# Patient Record
Sex: Female | Born: 1940 | Race: White | Hispanic: No | Smoking: Never smoker
Health system: Southern US, Community
[De-identification: ages and names within clinical notes are randomized; demographics above are authoritative.]

## PROBLEM LIST (undated history)

## (undated) ENCOUNTER — Emergency Department: Admission: EM | Discharge status: Absent without leave | Payer: Self-pay

## (undated) DIAGNOSIS — S065X9A Traumatic subdural hemorrhage with loss of consciousness of unspecified duration, initial encounter: Secondary | ICD-10-CM

## (undated) DIAGNOSIS — E785 Hyperlipidemia, unspecified: Secondary | ICD-10-CM

## (undated) DIAGNOSIS — S32009A Unspecified fracture of unspecified lumbar vertebra, initial encounter for closed fracture: Secondary | ICD-10-CM

## (undated) DIAGNOSIS — E079 Disorder of thyroid, unspecified: Secondary | ICD-10-CM

## (undated) DIAGNOSIS — R112 Nausea with vomiting, unspecified: Secondary | ICD-10-CM

## (undated) DIAGNOSIS — C44311 Basal cell carcinoma of skin of nose: Secondary | ICD-10-CM

## (undated) DIAGNOSIS — M353 Polymyalgia rheumatica: Secondary | ICD-10-CM

## (undated) DIAGNOSIS — I341 Nonrheumatic mitral (valve) prolapse: Secondary | ICD-10-CM

## (undated) DIAGNOSIS — R011 Cardiac murmur, unspecified: Secondary | ICD-10-CM

## (undated) DIAGNOSIS — I071 Rheumatic tricuspid insufficiency: Secondary | ICD-10-CM

## (undated) DIAGNOSIS — E871 Hypo-osmolality and hyponatremia: Secondary | ICD-10-CM

## (undated) DIAGNOSIS — Z9889 Other specified postprocedural states: Secondary | ICD-10-CM

## (undated) DIAGNOSIS — I951 Orthostatic hypotension: Secondary | ICD-10-CM

## (undated) DIAGNOSIS — E0590 Thyrotoxicosis, unspecified without thyrotoxic crisis or storm: Secondary | ICD-10-CM

## (undated) DIAGNOSIS — Z860100 Personal history of colon polyps, unspecified: Secondary | ICD-10-CM

## (undated) DIAGNOSIS — R55 Syncope and collapse: Secondary | ICD-10-CM

## (undated) DIAGNOSIS — I10 Essential (primary) hypertension: Secondary | ICD-10-CM

## (undated) DIAGNOSIS — I2510 Atherosclerotic heart disease of native coronary artery without angina pectoris: Secondary | ICD-10-CM

## (undated) DIAGNOSIS — K589 Irritable bowel syndrome without diarrhea: Secondary | ICD-10-CM

## (undated) DIAGNOSIS — M1990 Unspecified osteoarthritis, unspecified site: Secondary | ICD-10-CM

## (undated) DIAGNOSIS — R9431 Abnormal electrocardiogram [ECG] [EKG]: Secondary | ICD-10-CM

## (undated) DIAGNOSIS — IMO0002 Reserved for concepts with insufficient information to code with codable children: Secondary | ICD-10-CM

## (undated) DIAGNOSIS — H269 Unspecified cataract: Secondary | ICD-10-CM

## (undated) DIAGNOSIS — I4891 Unspecified atrial fibrillation: Secondary | ICD-10-CM

## (undated) DIAGNOSIS — E876 Hypokalemia: Secondary | ICD-10-CM

## (undated) DIAGNOSIS — S065XAA Traumatic subdural hemorrhage with loss of consciousness status unknown, initial encounter: Secondary | ICD-10-CM

## (undated) DIAGNOSIS — R9430 Abnormal result of cardiovascular function study, unspecified: Secondary | ICD-10-CM

## (undated) DIAGNOSIS — N870 Mild cervical dysplasia: Secondary | ICD-10-CM

## (undated) DIAGNOSIS — I5181 Takotsubo syndrome: Secondary | ICD-10-CM

## (undated) DIAGNOSIS — N3020 Other chronic cystitis without hematuria: Secondary | ICD-10-CM

## (undated) HISTORY — DX: Hyperlipidemia, unspecified: E78.5

## (undated) HISTORY — DX: Syncope and collapse: R55

## (undated) HISTORY — DX: Mild cervical dysplasia: N87.0

## (undated) HISTORY — DX: Rheumatic tricuspid insufficiency: I07.1

## (undated) HISTORY — DX: Hypo-osmolality and hyponatremia: E87.1

## (undated) HISTORY — DX: Disorder of thyroid, unspecified: E07.9

## (undated) HISTORY — DX: Personal history of colonic polyps: Z86.010

## (undated) HISTORY — DX: Unspecified fracture of unspecified lumbar vertebra, initial encounter for closed fracture: S32.009A

## (undated) HISTORY — DX: Hypokalemia: E87.6

## (undated) HISTORY — DX: Polymyalgia rheumatica: M35.3

## (undated) HISTORY — DX: Abnormal electrocardiogram (ECG) (EKG): R94.31

## (undated) HISTORY — DX: Nonrheumatic mitral (valve) prolapse: I34.1

## (undated) HISTORY — DX: Irritable bowel syndrome, unspecified: K58.9

## (undated) HISTORY — PX: TONSILLECTOMY: SUR1361

## (undated) HISTORY — DX: Thyrotoxicosis, unspecified without thyrotoxic crisis or storm: E05.90

## (undated) HISTORY — DX: Orthostatic hypotension: I95.1

## (undated) HISTORY — DX: Essential (primary) hypertension: I10

## (undated) HISTORY — DX: Reserved for concepts with insufficient information to code with codable children: IMO0002

## (undated) HISTORY — DX: Personal history of colon polyps, unspecified: Z86.0100

## (undated) HISTORY — DX: Other specified postprocedural states: Z98.890

## (undated) HISTORY — PX: COLPOSCOPY: SHX161

## (undated) HISTORY — DX: Other chronic cystitis without hematuria: N30.20

## (undated) HISTORY — DX: Traumatic subdural hemorrhage with loss of consciousness status unknown, initial encounter: S06.5XAA

## (undated) HISTORY — DX: Traumatic subdural hemorrhage with loss of consciousness of unspecified duration, initial encounter: S06.5X9A

## (undated) HISTORY — DX: Abnormal result of cardiovascular function study, unspecified: R94.30

## (undated) HISTORY — DX: Unspecified cataract: H26.9

---

## 1998-03-29 ENCOUNTER — Other Ambulatory Visit: Admission: RE | Admit: 1998-03-29 | Discharge: 1998-03-29 | Payer: Self-pay | Admitting: Obstetrics and Gynecology

## 1999-04-14 ENCOUNTER — Other Ambulatory Visit: Admission: RE | Admit: 1999-04-14 | Discharge: 1999-04-14 | Payer: Self-pay | Admitting: Obstetrics and Gynecology

## 2000-05-08 ENCOUNTER — Other Ambulatory Visit: Admission: RE | Admit: 2000-05-08 | Discharge: 2000-05-08 | Payer: Self-pay | Admitting: Obstetrics and Gynecology

## 2000-09-17 ENCOUNTER — Encounter (INDEPENDENT_AMBULATORY_CARE_PROVIDER_SITE_OTHER): Payer: Self-pay

## 2000-09-17 ENCOUNTER — Other Ambulatory Visit: Admission: RE | Admit: 2000-09-17 | Discharge: 2000-09-17 | Payer: Self-pay | Admitting: Gastroenterology

## 2001-05-12 ENCOUNTER — Other Ambulatory Visit: Admission: RE | Admit: 2001-05-12 | Discharge: 2001-05-12 | Payer: Self-pay | Admitting: Obstetrics and Gynecology

## 2001-08-06 HISTORY — PX: EXCISIONAL HEMORRHOIDECTOMY: SHX1541

## 2001-08-07 ENCOUNTER — Ambulatory Visit (HOSPITAL_BASED_OUTPATIENT_CLINIC_OR_DEPARTMENT_OTHER): Admission: RE | Admit: 2001-08-07 | Discharge: 2001-08-08 | Payer: Self-pay | Admitting: *Deleted

## 2001-08-07 ENCOUNTER — Encounter (INDEPENDENT_AMBULATORY_CARE_PROVIDER_SITE_OTHER): Payer: Self-pay | Admitting: *Deleted

## 2002-05-06 ENCOUNTER — Other Ambulatory Visit: Admission: RE | Admit: 2002-05-06 | Discharge: 2002-05-06 | Payer: Self-pay | Admitting: Obstetrics and Gynecology

## 2003-06-03 ENCOUNTER — Other Ambulatory Visit: Admission: RE | Admit: 2003-06-03 | Discharge: 2003-06-03 | Payer: Self-pay | Admitting: Obstetrics and Gynecology

## 2003-09-22 ENCOUNTER — Ambulatory Visit (HOSPITAL_COMMUNITY): Admission: RE | Admit: 2003-09-22 | Discharge: 2003-09-22 | Payer: Self-pay | Admitting: Orthopedic Surgery

## 2003-10-22 ENCOUNTER — Encounter: Admission: RE | Admit: 2003-10-22 | Discharge: 2003-10-22 | Payer: Self-pay | Admitting: Orthopedic Surgery

## 2003-11-11 ENCOUNTER — Encounter: Admission: RE | Admit: 2003-11-11 | Discharge: 2003-11-11 | Payer: Self-pay | Admitting: Orthopedic Surgery

## 2003-12-23 HISTORY — PX: COLONOSCOPY: SHX174

## 2003-12-30 ENCOUNTER — Encounter: Admission: RE | Admit: 2003-12-30 | Discharge: 2003-12-30 | Payer: Self-pay | Admitting: Orthopedic Surgery

## 2004-06-02 ENCOUNTER — Other Ambulatory Visit: Admission: RE | Admit: 2004-06-02 | Discharge: 2004-06-02 | Payer: Self-pay | Admitting: Obstetrics and Gynecology

## 2004-06-28 ENCOUNTER — Ambulatory Visit: Payer: Self-pay | Admitting: Internal Medicine

## 2004-08-08 ENCOUNTER — Encounter: Admission: RE | Admit: 2004-08-08 | Discharge: 2004-08-08 | Payer: Self-pay | Admitting: Orthopedic Surgery

## 2004-08-28 ENCOUNTER — Encounter: Admission: RE | Admit: 2004-08-28 | Discharge: 2004-08-28 | Payer: Self-pay | Admitting: Orthopedic Surgery

## 2004-10-11 ENCOUNTER — Ambulatory Visit: Payer: Self-pay | Admitting: Internal Medicine

## 2004-11-08 ENCOUNTER — Ambulatory Visit: Payer: Self-pay | Admitting: Internal Medicine

## 2004-12-14 ENCOUNTER — Ambulatory Visit: Payer: Self-pay | Admitting: *Deleted

## 2005-02-23 ENCOUNTER — Encounter: Admission: RE | Admit: 2005-02-23 | Discharge: 2005-02-23 | Payer: Self-pay | Admitting: Orthopedic Surgery

## 2005-04-06 ENCOUNTER — Ambulatory Visit: Payer: Self-pay | Admitting: *Deleted

## 2005-04-11 ENCOUNTER — Ambulatory Visit: Payer: Self-pay | Admitting: *Deleted

## 2005-04-18 ENCOUNTER — Ambulatory Visit: Payer: Self-pay

## 2005-04-25 ENCOUNTER — Ambulatory Visit (HOSPITAL_COMMUNITY): Admission: RE | Admit: 2005-04-25 | Discharge: 2005-04-25 | Payer: Self-pay | Admitting: Neurosurgery

## 2005-06-04 ENCOUNTER — Other Ambulatory Visit: Admission: RE | Admit: 2005-06-04 | Discharge: 2005-06-04 | Payer: Self-pay | Admitting: Obstetrics and Gynecology

## 2005-08-14 ENCOUNTER — Ambulatory Visit: Payer: Self-pay | Admitting: *Deleted

## 2005-11-15 ENCOUNTER — Ambulatory Visit: Payer: Self-pay | Admitting: *Deleted

## 2005-12-05 ENCOUNTER — Ambulatory Visit: Payer: Self-pay | Admitting: Internal Medicine

## 2006-03-05 ENCOUNTER — Ambulatory Visit: Payer: Self-pay | Admitting: *Deleted

## 2006-05-27 ENCOUNTER — Ambulatory Visit: Payer: Self-pay | Admitting: *Deleted

## 2006-05-27 ENCOUNTER — Ambulatory Visit: Payer: Self-pay | Admitting: Internal Medicine

## 2006-05-27 LAB — CONVERTED CEMR LAB
BUN: 13 mg/dL (ref 6–23)
CO2: 31 meq/L (ref 19–32)
Calcium: 9 mg/dL (ref 8.4–10.5)
Chloride: 106 meq/L (ref 96–112)
Chol/HDL Ratio, serum: 2.2
Cholesterol: 141 mg/dL (ref 0–200)
Creatinine, Ser: 0.7 mg/dL (ref 0.4–1.2)
GFR calc non Af Amer: 89 mL/min
Glomerular Filtration Rate, Af Am: 108 mL/min/{1.73_m2}
Glucose, Bld: 94 mg/dL (ref 70–99)
HDL: 64.5 mg/dL (ref >39.0)
Hgb A1c MFr Bld: 5.8 % (ref 4.6–6.0)
LDL Cholesterol: 63 mg/dL (ref 0–99)
Potassium: 4.1 meq/L (ref 3.5–5.1)
Sodium: 142 meq/L (ref 135–145)
Triglyceride fasting, serum: 69 mg/dL (ref 0–149)
VLDL: 14 mg/dL (ref 0–40)

## 2006-06-03 ENCOUNTER — Ambulatory Visit: Payer: Self-pay | Admitting: Gastroenterology

## 2006-06-05 ENCOUNTER — Other Ambulatory Visit: Admission: RE | Admit: 2006-06-05 | Discharge: 2006-06-05 | Payer: Self-pay | Admitting: Obstetrics and Gynecology

## 2006-06-12 ENCOUNTER — Ambulatory Visit: Payer: Self-pay | Admitting: *Deleted

## 2006-07-01 ENCOUNTER — Ambulatory Visit: Payer: Self-pay | Admitting: Internal Medicine

## 2006-07-03 ENCOUNTER — Ambulatory Visit: Payer: Self-pay | Admitting: *Deleted

## 2006-07-03 LAB — CONVERTED CEMR LAB
BUN: 13 mg/dL (ref 6–23)
CO2: 30 meq/L (ref 19–32)
Calcium: 9.3 mg/dL (ref 8.4–10.5)
Chloride: 105 meq/L (ref 96–112)
Creatinine, Ser: 0.8 mg/dL (ref 0.4–1.2)
GFR calc non Af Amer: 76 mL/min
Glomerular Filtration Rate, Af Am: 92 mL/min/{1.73_m2}
Glucose, Bld: 114 mg/dL — ABNORMAL HIGH (ref 70–99)
Potassium: 3.8 meq/L (ref 3.5–5.1)
Sodium: 142 meq/L (ref 135–145)

## 2006-07-24 ENCOUNTER — Ambulatory Visit: Payer: Self-pay | Admitting: *Deleted

## 2006-10-08 ENCOUNTER — Ambulatory Visit: Payer: Self-pay | Admitting: Internal Medicine

## 2006-10-08 LAB — CONVERTED CEMR LAB
ALT: 21 units/L (ref 0–40)
ANA Titer 1: 1:160 {titer} — ABNORMAL HIGH
AST: 20 units/L (ref 0–37)
Anti Nuclear Antibody(ANA): POSITIVE — AB
Basophils Absolute: 0.1 10*3/uL (ref 0.0–0.1)
Basophils Relative: 0.7 % (ref 0.0–1.0)
Eosinophils Absolute: 0.2 10*3/uL (ref 0.0–0.6)
Eosinophils Relative: 2.1 % (ref 0.0–5.0)
Free T4: 1 ng/dL (ref 0.6–1.6)
HCT: 40 % (ref 36.0–46.0)
Hemoglobin: 13.2 g/dL (ref 12.0–15.0)
Lymphocytes Relative: 19.2 % (ref 12.0–46.0)
MCHC: 33 g/dL (ref 30.0–36.0)
MCV: 88.5 fL (ref 78.0–100.0)
Monocytes Absolute: 0.6 10*3/uL (ref 0.2–0.7)
Monocytes Relative: 8.3 % (ref 3.0–11.0)
Neutro Abs: 5.2 10*3/uL (ref 1.4–7.7)
Neutrophils Relative %: 69.7 % (ref 43.0–77.0)
Platelets: 355 10*3/uL (ref 150–400)
RBC: 4.52 M/uL (ref 3.87–5.11)
RDW: 11.6 % (ref 11.5–14.6)
Rhuematoid fact SerPl-aCnc: <20 intl units/mL — ABNORMAL LOW (ref 0.0–20.0)
TSH: 2.12 microintl units/mL (ref 0.35–5.50)
Total CK: 40 units/L (ref 7–177)
Vitamin B-12: 385 pg/mL (ref 211–911)
WBC: 7.6 10*3/uL (ref 4.5–10.5)

## 2006-10-10 ENCOUNTER — Ambulatory Visit: Payer: Self-pay | Admitting: *Deleted

## 2006-10-10 LAB — CONVERTED CEMR LAB
BUN: 13 mg/dL (ref 6–23)
CO2: 30 meq/L (ref 19–32)
Calcium: 9.6 mg/dL (ref 8.4–10.5)
Chloride: 102 meq/L (ref 96–112)
Creatinine, Ser: 0.7 mg/dL (ref 0.4–1.2)
GFR calc Af Amer: 107 mL/min
GFR calc non Af Amer: 89 mL/min
Glucose, Bld: 86 mg/dL (ref 70–99)
Potassium: 4.6 meq/L (ref 3.5–5.1)
Sodium: 140 meq/L (ref 135–145)

## 2007-01-01 ENCOUNTER — Ambulatory Visit: Payer: Self-pay | Admitting: Internal Medicine

## 2007-01-22 ENCOUNTER — Ambulatory Visit: Payer: Self-pay | Admitting: Cardiology

## 2007-01-29 ENCOUNTER — Ambulatory Visit: Payer: Self-pay | Admitting: Cardiology

## 2007-01-29 LAB — CONVERTED CEMR LAB
ALT: 19 units/L (ref 0–35)
AST: 18 units/L (ref 0–37)
Albumin: 3.3 g/dL — ABNORMAL LOW (ref 3.5–5.2)
Alkaline Phosphatase: 51 units/L (ref 39–117)
BUN: 10 mg/dL (ref 6–23)
Bilirubin, Direct: 0.1 mg/dL (ref 0.0–0.3)
CO2: 30 meq/L (ref 19–32)
Calcium: 8.9 mg/dL (ref 8.4–10.5)
Chloride: 107 meq/L (ref 96–112)
Cholesterol: 165 mg/dL (ref 0–200)
Creatinine, Ser: 0.8 mg/dL (ref 0.4–1.2)
GFR calc Af Amer: 92 mL/min
GFR calc non Af Amer: 76 mL/min
Glucose, Bld: 82 mg/dL (ref 70–99)
HDL: 91 mg/dL (ref >39.0)
Hgb A1c MFr Bld: 6 % (ref 4.6–6.0)
LDL Cholesterol: 58 mg/dL (ref 0–99)
Potassium: 3.7 meq/L (ref 3.5–5.1)
Sodium: 147 meq/L — ABNORMAL HIGH (ref 135–145)
Total Bilirubin: 0.7 mg/dL (ref 0.3–1.2)
Total CHOL/HDL Ratio: 1.8
Total Protein: 6 g/dL (ref 6.0–8.3)
Triglycerides: 82 mg/dL (ref 0–149)
VLDL: 16 mg/dL (ref 0–40)

## 2007-03-11 DIAGNOSIS — M810 Age-related osteoporosis without current pathological fracture: Secondary | ICD-10-CM | POA: Insufficient documentation

## 2007-03-19 ENCOUNTER — Telehealth (INDEPENDENT_AMBULATORY_CARE_PROVIDER_SITE_OTHER): Payer: Self-pay | Admitting: *Deleted

## 2007-03-21 ENCOUNTER — Ambulatory Visit: Payer: Self-pay | Admitting: Family Medicine

## 2007-03-21 ENCOUNTER — Telehealth: Payer: Self-pay | Admitting: Internal Medicine

## 2007-03-24 ENCOUNTER — Telehealth: Payer: Self-pay | Admitting: Internal Medicine

## 2007-03-25 ENCOUNTER — Telehealth: Payer: Self-pay | Admitting: Internal Medicine

## 2007-03-27 ENCOUNTER — Ambulatory Visit: Payer: Self-pay | Admitting: Vascular Surgery

## 2007-03-27 ENCOUNTER — Ambulatory Visit: Payer: Self-pay | Admitting: Internal Medicine

## 2007-03-27 ENCOUNTER — Ambulatory Visit: Payer: Self-pay | Admitting: Cardiology

## 2007-03-27 ENCOUNTER — Inpatient Hospital Stay (HOSPITAL_COMMUNITY): Admission: EM | Admit: 2007-03-27 | Discharge: 2007-03-28 | Payer: Self-pay | Admitting: Emergency Medicine

## 2007-03-27 ENCOUNTER — Encounter (INDEPENDENT_AMBULATORY_CARE_PROVIDER_SITE_OTHER): Payer: Self-pay | Admitting: Internal Medicine

## 2007-03-28 ENCOUNTER — Encounter: Payer: Self-pay | Admitting: Internal Medicine

## 2007-03-28 ENCOUNTER — Encounter (INDEPENDENT_AMBULATORY_CARE_PROVIDER_SITE_OTHER): Payer: Self-pay | Admitting: Internal Medicine

## 2007-04-02 ENCOUNTER — Ambulatory Visit: Payer: Self-pay | Admitting: Internal Medicine

## 2007-04-02 DIAGNOSIS — M353 Polymyalgia rheumatica: Secondary | ICD-10-CM | POA: Insufficient documentation

## 2007-04-04 ENCOUNTER — Telehealth: Payer: Self-pay | Admitting: Internal Medicine

## 2007-04-04 ENCOUNTER — Ambulatory Visit: Payer: Self-pay | Admitting: Internal Medicine

## 2007-04-04 LAB — CONVERTED CEMR LAB
BUN: 10 mg/dL (ref 6–23)
CO2: 26 meq/L (ref 19–32)
Calcium: 9.2 mg/dL (ref 8.4–10.5)
Chloride: 98 meq/L (ref 96–112)
Creatinine, Ser: 0.8 mg/dL (ref 0.4–1.2)
GFR calc Af Amer: 92 mL/min
GFR calc non Af Amer: 76 mL/min
Glucose, Bld: 114 mg/dL — ABNORMAL HIGH (ref 70–99)
Potassium: 4.9 meq/L (ref 3.5–5.1)
Sodium: 130 meq/L — ABNORMAL LOW (ref 135–145)

## 2007-04-07 ENCOUNTER — Encounter: Payer: Self-pay | Admitting: Internal Medicine

## 2007-04-07 DIAGNOSIS — S06309A Unspecified focal traumatic brain injury with loss of consciousness of unspecified duration, initial encounter: Secondary | ICD-10-CM

## 2007-04-07 DIAGNOSIS — S020XXA Fracture of vault of skull, initial encounter for closed fracture: Secondary | ICD-10-CM | POA: Insufficient documentation

## 2007-04-18 ENCOUNTER — Telehealth: Payer: Self-pay | Admitting: Internal Medicine

## 2007-04-21 ENCOUNTER — Ambulatory Visit: Payer: Self-pay | Admitting: Internal Medicine

## 2007-04-22 ENCOUNTER — Encounter: Payer: Self-pay | Admitting: Internal Medicine

## 2007-04-24 ENCOUNTER — Encounter: Payer: Self-pay | Admitting: Internal Medicine

## 2007-04-25 ENCOUNTER — Telehealth: Payer: Self-pay | Admitting: Internal Medicine

## 2007-05-07 ENCOUNTER — Ambulatory Visit: Payer: Self-pay | Admitting: Internal Medicine

## 2007-05-08 ENCOUNTER — Encounter: Payer: Self-pay | Admitting: Internal Medicine

## 2007-06-18 ENCOUNTER — Other Ambulatory Visit: Admission: RE | Admit: 2007-06-18 | Discharge: 2007-06-18 | Payer: Self-pay | Admitting: Obstetrics and Gynecology

## 2007-07-16 ENCOUNTER — Ambulatory Visit: Payer: Self-pay | Admitting: Cardiology

## 2007-07-16 LAB — CONVERTED CEMR LAB
BUN: 13 mg/dL (ref 6–23)
CO2: 29 meq/L (ref 19–32)
Calcium: 9.7 mg/dL (ref 8.4–10.5)
Chloride: 99 meq/L (ref 96–112)
Creatinine, Ser: 0.8 mg/dL (ref 0.4–1.2)
GFR calc Af Amer: 92 mL/min
GFR calc non Af Amer: 76 mL/min
Glucose, Bld: 94 mg/dL (ref 70–99)
Potassium: 4.3 meq/L (ref 3.5–5.1)
Sodium: 136 meq/L (ref 135–145)

## 2008-01-22 ENCOUNTER — Ambulatory Visit: Payer: Self-pay | Admitting: Cardiology

## 2008-01-30 ENCOUNTER — Ambulatory Visit: Payer: Self-pay | Admitting: Cardiology

## 2008-01-30 LAB — CONVERTED CEMR LAB
ALT: 19 units/L (ref 0–35)
AST: 19 units/L (ref 0–37)
Albumin: 3.8 g/dL (ref 3.5–5.2)
Alkaline Phosphatase: 57 units/L (ref 39–117)
BUN: 11 mg/dL (ref 6–23)
Bilirubin, Direct: 0.1 mg/dL (ref 0.0–0.3)
CO2: 29 meq/L (ref 19–32)
Calcium: 9 mg/dL (ref 8.4–10.5)
Chloride: 102 meq/L (ref 96–112)
Cholesterol: 158 mg/dL (ref 0–200)
Creatinine, Ser: 0.7 mg/dL (ref 0.4–1.2)
GFR calc Af Amer: 107 mL/min
GFR calc non Af Amer: 88 mL/min
Glucose, Bld: 93 mg/dL (ref 70–99)
HDL: 77 mg/dL (ref >39.0)
LDL Cholesterol: 70 mg/dL (ref 0–99)
Potassium: 3.6 meq/L (ref 3.5–5.1)
Sodium: 137 meq/L (ref 135–145)
Total Bilirubin: 0.9 mg/dL (ref 0.3–1.2)
Total CHOL/HDL Ratio: 2.1
Total Protein: 6.4 g/dL (ref 6.0–8.3)
Triglycerides: 54 mg/dL (ref 0–149)
VLDL: 11 mg/dL (ref 0–40)

## 2008-05-12 ENCOUNTER — Encounter: Payer: Self-pay | Admitting: Internal Medicine

## 2008-05-18 ENCOUNTER — Ambulatory Visit: Payer: Self-pay | Admitting: Internal Medicine

## 2008-06-23 ENCOUNTER — Ambulatory Visit: Payer: Self-pay | Admitting: Obstetrics and Gynecology

## 2008-06-23 ENCOUNTER — Telehealth: Payer: Self-pay | Admitting: Internal Medicine

## 2008-06-23 ENCOUNTER — Other Ambulatory Visit: Admission: RE | Admit: 2008-06-23 | Discharge: 2008-06-23 | Payer: Self-pay | Admitting: Obstetrics and Gynecology

## 2008-06-23 ENCOUNTER — Encounter: Payer: Self-pay | Admitting: Obstetrics and Gynecology

## 2008-07-09 ENCOUNTER — Ambulatory Visit: Payer: Self-pay | Admitting: Cardiology

## 2008-07-12 ENCOUNTER — Ambulatory Visit: Payer: Self-pay | Admitting: Obstetrics and Gynecology

## 2008-07-14 ENCOUNTER — Telehealth: Payer: Self-pay | Admitting: *Deleted

## 2008-07-15 ENCOUNTER — Ambulatory Visit (HOSPITAL_COMMUNITY): Admission: RE | Admit: 2008-07-15 | Discharge: 2008-07-15 | Payer: Self-pay | Admitting: Obstetrics and Gynecology

## 2008-07-19 ENCOUNTER — Ambulatory Visit: Payer: Self-pay | Admitting: Internal Medicine

## 2008-07-20 ENCOUNTER — Ambulatory Visit: Payer: Self-pay | Admitting: Cardiology

## 2008-07-20 LAB — CONVERTED CEMR LAB
Cholesterol: 149 mg/dL (ref 0–200)
HDL: 68 mg/dL (ref >39.0)
LDL Cholesterol: 67 mg/dL (ref 0–99)
Total CHOL/HDL Ratio: 2.2
Triglycerides: 69 mg/dL (ref 0–149)
VLDL: 14 mg/dL (ref 0–40)

## 2008-08-11 ENCOUNTER — Ambulatory Visit: Payer: Self-pay | Admitting: Cardiology

## 2008-08-11 LAB — CONVERTED CEMR LAB
ALT: 22 units/L (ref 0–35)
AST: 23 units/L (ref 0–37)
Albumin: 4 g/dL (ref 3.5–5.2)
Alkaline Phosphatase: 67 units/L (ref 39–117)
BUN: 15 mg/dL (ref 6–23)
Bilirubin, Direct: 0.2 mg/dL (ref 0.0–0.3)
CO2: 30 meq/L (ref 19–32)
Calcium: 9.3 mg/dL (ref 8.4–10.5)
Chloride: 102 meq/L (ref 96–112)
Creatinine, Ser: 0.7 mg/dL (ref 0.4–1.2)
GFR calc Af Amer: 107 mL/min
GFR calc non Af Amer: 88 mL/min
Glucose, Bld: 98 mg/dL (ref 70–99)
Potassium: 4.8 meq/L (ref 3.5–5.1)
Sodium: 138 meq/L (ref 135–145)
Total Bilirubin: 0.9 mg/dL (ref 0.3–1.2)
Total Protein: 6.3 g/dL (ref 6.0–8.3)

## 2009-01-17 ENCOUNTER — Encounter: Payer: Self-pay | Admitting: Internal Medicine

## 2009-01-31 ENCOUNTER — Encounter: Payer: Self-pay | Admitting: Cardiology

## 2009-02-02 ENCOUNTER — Ambulatory Visit: Payer: Self-pay | Admitting: Cardiology

## 2009-02-02 LAB — CONVERTED CEMR LAB
ALT: 21 units/L (ref 0–35)
AST: 23 units/L (ref 0–37)
Albumin: 3.8 g/dL (ref 3.5–5.2)
Alkaline Phosphatase: 125 units/L — ABNORMAL HIGH (ref 39–117)
BUN: 10 mg/dL (ref 6–23)
Bilirubin, Direct: 0.1 mg/dL (ref 0.0–0.3)
CO2: 30 meq/L (ref 19–32)
Calcium: 9.2 mg/dL (ref 8.4–10.5)
Chloride: 109 meq/L (ref 96–112)
Cholesterol: 152 mg/dL (ref 0–200)
Creatinine, Ser: 0.7 mg/dL (ref 0.4–1.2)
GFR calc non Af Amer: 88.03 mL/min (ref >60)
Glucose, Bld: 94 mg/dL (ref 70–99)
HDL: 68.5 mg/dL (ref >39.00)
LDL Cholesterol: 72 mg/dL (ref 0–99)
Potassium: 4 meq/L (ref 3.5–5.1)
Sodium: 145 meq/L (ref 135–145)
Total Bilirubin: 0.7 mg/dL (ref 0.3–1.2)
Total CHOL/HDL Ratio: 2
Total Protein: 6.5 g/dL (ref 6.0–8.3)
Triglycerides: 59 mg/dL (ref 0.0–149.0)
VLDL: 11.8 mg/dL (ref 0.0–40.0)

## 2009-02-03 ENCOUNTER — Ambulatory Visit: Payer: Self-pay | Admitting: Cardiology

## 2009-02-03 ENCOUNTER — Telehealth: Payer: Self-pay | Admitting: Cardiology

## 2009-05-16 ENCOUNTER — Telehealth: Payer: Self-pay | Admitting: Cardiology

## 2009-05-18 ENCOUNTER — Ambulatory Visit: Payer: Self-pay | Admitting: Internal Medicine

## 2009-07-06 ENCOUNTER — Ambulatory Visit: Payer: Self-pay | Admitting: Obstetrics and Gynecology

## 2009-07-06 ENCOUNTER — Other Ambulatory Visit: Admission: RE | Admit: 2009-07-06 | Discharge: 2009-07-06 | Payer: Self-pay | Admitting: Obstetrics and Gynecology

## 2009-07-06 LAB — CONVERTED CEMR LAB: Pap Smear: NORMAL

## 2009-07-08 ENCOUNTER — Encounter: Payer: Self-pay | Admitting: Internal Medicine

## 2009-07-20 ENCOUNTER — Ambulatory Visit: Payer: Self-pay | Admitting: Obstetrics and Gynecology

## 2009-08-03 ENCOUNTER — Ambulatory Visit (HOSPITAL_COMMUNITY): Admission: RE | Admit: 2009-08-03 | Discharge: 2009-08-03 | Payer: Self-pay | Admitting: Obstetrics and Gynecology

## 2009-08-06 HISTORY — PX: MOHS SURGERY: SHX181

## 2009-08-09 ENCOUNTER — Ambulatory Visit: Payer: Self-pay | Admitting: Obstetrics and Gynecology

## 2009-08-12 ENCOUNTER — Ambulatory Visit: Payer: Self-pay | Admitting: Cardiology

## 2009-08-24 ENCOUNTER — Ambulatory Visit: Payer: Self-pay | Admitting: Cardiology

## 2009-08-24 ENCOUNTER — Ambulatory Visit (HOSPITAL_COMMUNITY): Admission: RE | Admit: 2009-08-24 | Discharge: 2009-08-24 | Payer: Self-pay | Admitting: Cardiovascular Disease

## 2009-08-24 ENCOUNTER — Ambulatory Visit: Payer: Self-pay

## 2009-08-24 ENCOUNTER — Encounter: Payer: Self-pay | Admitting: Cardiology

## 2009-08-26 ENCOUNTER — Ambulatory Visit: Payer: Self-pay | Admitting: Cardiology

## 2009-08-26 LAB — CONVERTED CEMR LAB
ALT: 20 units/L (ref 0–35)
AST: 22 units/L (ref 0–37)
Albumin: 4.2 g/dL (ref 3.5–5.2)
Alkaline Phosphatase: 73 units/L (ref 39–117)
BUN: 10 mg/dL (ref 6–23)
Bilirubin, Direct: 0.1 mg/dL (ref 0.0–0.3)
CO2: 29 meq/L (ref 19–32)
Calcium: 9.1 mg/dL (ref 8.4–10.5)
Chloride: 107 meq/L (ref 96–112)
Cholesterol: 160 mg/dL (ref 0–200)
Creatinine, Ser: 0.6 mg/dL (ref 0.4–1.2)
GFR calc non Af Amer: 104.99 mL/min (ref >60)
Glucose, Bld: 95 mg/dL (ref 70–99)
HDL: 75.9 mg/dL (ref >39.00)
LDL Cholesterol: 71 mg/dL (ref 0–99)
Potassium: 4.2 meq/L (ref 3.5–5.1)
Sodium: 141 meq/L (ref 135–145)
Total Bilirubin: 0.8 mg/dL (ref 0.3–1.2)
Total CHOL/HDL Ratio: 2
Total Protein: 6.9 g/dL (ref 6.0–8.3)
Triglycerides: 65 mg/dL (ref 0.0–149.0)
VLDL: 13 mg/dL (ref 0.0–40.0)

## 2009-12-29 ENCOUNTER — Telehealth: Payer: Self-pay | Admitting: Internal Medicine

## 2010-01-30 ENCOUNTER — Encounter: Payer: Self-pay | Admitting: Internal Medicine

## 2010-02-15 ENCOUNTER — Encounter: Payer: Self-pay | Admitting: Cardiology

## 2010-02-16 ENCOUNTER — Ambulatory Visit: Payer: Self-pay | Admitting: Cardiology

## 2010-02-24 ENCOUNTER — Ambulatory Visit: Payer: Self-pay | Admitting: Internal Medicine

## 2010-02-24 DIAGNOSIS — Z85828 Personal history of other malignant neoplasm of skin: Secondary | ICD-10-CM | POA: Insufficient documentation

## 2010-06-01 ENCOUNTER — Ambulatory Visit: Payer: Self-pay | Admitting: Internal Medicine

## 2010-06-20 ENCOUNTER — Telehealth: Payer: Self-pay | Admitting: Cardiology

## 2010-06-21 ENCOUNTER — Telehealth: Payer: Self-pay | Admitting: Cardiology

## 2010-07-12 ENCOUNTER — Ambulatory Visit: Payer: Self-pay | Admitting: Obstetrics and Gynecology

## 2010-07-26 ENCOUNTER — Encounter: Payer: Self-pay | Admitting: Internal Medicine

## 2010-08-06 HISTORY — PX: TOTAL KNEE ARTHROPLASTY: SHX125

## 2010-08-22 ENCOUNTER — Ambulatory Visit
Admission: RE | Admit: 2010-08-22 | Discharge: 2010-08-22 | Payer: Self-pay | Source: Home / Self Care | Attending: Cardiology | Admitting: Cardiology

## 2010-08-22 ENCOUNTER — Encounter: Payer: Self-pay | Admitting: Cardiology

## 2010-08-23 ENCOUNTER — Ambulatory Visit
Admission: RE | Admit: 2010-08-23 | Discharge: 2010-08-23 | Payer: Self-pay | Source: Home / Self Care | Attending: Internal Medicine | Admitting: Internal Medicine

## 2010-08-23 ENCOUNTER — Other Ambulatory Visit: Payer: Self-pay | Admitting: Cardiology

## 2010-08-23 ENCOUNTER — Ambulatory Visit
Admission: RE | Admit: 2010-08-23 | Discharge: 2010-08-23 | Payer: Self-pay | Source: Home / Self Care | Attending: Cardiology | Admitting: Cardiology

## 2010-08-23 ENCOUNTER — Ambulatory Visit: Admission: RE | Admit: 2010-08-23 | Payer: Self-pay | Source: Home / Self Care | Admitting: Cardiology

## 2010-08-23 ENCOUNTER — Encounter: Payer: Self-pay | Admitting: Internal Medicine

## 2010-08-23 DIAGNOSIS — M1710 Unilateral primary osteoarthritis, unspecified knee: Secondary | ICD-10-CM | POA: Insufficient documentation

## 2010-08-23 DIAGNOSIS — J310 Chronic rhinitis: Secondary | ICD-10-CM | POA: Insufficient documentation

## 2010-08-23 DIAGNOSIS — IMO0002 Reserved for concepts with insufficient information to code with codable children: Secondary | ICD-10-CM

## 2010-08-23 LAB — BASIC METABOLIC PANEL
BUN: 13 mg/dL (ref 6–23)
CO2: 28 mEq/L (ref 19–32)
Calcium: 9.4 mg/dL (ref 8.4–10.5)
Chloride: 109 mEq/L (ref 96–112)
Creatinine, Ser: 0.6 mg/dL (ref 0.4–1.2)
GFR: 104.69 mL/min (ref >60.00)
Glucose, Bld: 86 mg/dL (ref 70–99)
Potassium: 4.7 mEq/L (ref 3.5–5.1)
Sodium: 144 mEq/L (ref 135–145)

## 2010-08-23 LAB — LIPID PANEL
Cholesterol: 152 mg/dL (ref 0–200)
HDL: 69 mg/dL (ref >39.00)
LDL Cholesterol: 72 mg/dL (ref 0–99)
Total CHOL/HDL Ratio: 2
Triglycerides: 54 mg/dL (ref 0.0–149.0)
VLDL: 10.8 mg/dL (ref 0.0–40.0)

## 2010-08-23 LAB — HEPATIC FUNCTION PANEL
ALT: 20 U/L (ref 0–35)
AST: 23 U/L (ref 0–37)
Albumin: 3.9 g/dL (ref 3.5–5.2)
Alkaline Phosphatase: 69 U/L (ref 39–117)
Bilirubin, Direct: 0.1 mg/dL (ref 0.0–0.3)
Total Bilirubin: 0.3 mg/dL (ref 0.3–1.2)
Total Protein: 6.3 g/dL (ref 6.0–8.3)

## 2010-08-26 ENCOUNTER — Encounter: Payer: Self-pay | Admitting: Orthopedic Surgery

## 2010-08-27 ENCOUNTER — Encounter: Payer: Self-pay | Admitting: Orthopedic Surgery

## 2010-08-28 LAB — URINALYSIS, ROUTINE W REFLEX MICROSCOPIC
Bilirubin Urine: NEGATIVE
Hgb urine dipstick: NEGATIVE
Ketones, ur: NEGATIVE mg/dL
Nitrite: NEGATIVE
Protein, ur: NEGATIVE mg/dL
Specific Gravity, Urine: 1.006 (ref 1.005–1.030)
Urine Glucose, Fasting: NEGATIVE mg/dL
Urobilinogen, UA: 0.2 mg/dL (ref 0.0–1.0)
pH: 6 (ref 5.0–8.0)

## 2010-08-28 LAB — CBC
HCT: 43.4 % (ref 36.0–46.0)
Hemoglobin: 14 g/dL (ref 12.0–15.0)
MCH: 29.8 pg (ref 26.0–34.0)
MCHC: 32.3 g/dL (ref 30.0–36.0)
MCV: 92.3 fL (ref 78.0–100.0)
Platelets: 242 10*3/uL (ref 150–400)
RBC: 4.7 MIL/uL (ref 3.87–5.11)
RDW: 12.7 % (ref 11.5–15.5)
WBC: 6.3 10*3/uL (ref 4.0–10.5)

## 2010-08-28 LAB — PROTIME-INR
INR: 1.1 (ref 0.00–1.49)
Prothrombin Time: 14.4 seconds (ref 11.6–15.2)

## 2010-08-28 LAB — APTT: aPTT: 41 seconds — ABNORMAL HIGH (ref 24–37)

## 2010-08-28 LAB — SURGICAL PCR SCREEN
MRSA, PCR: NEGATIVE
Staphylococcus aureus: NEGATIVE

## 2010-08-30 ENCOUNTER — Inpatient Hospital Stay (HOSPITAL_COMMUNITY)
Admission: RE | Admit: 2010-08-30 | Discharge: 2010-09-04 | Payer: Self-pay | Source: Home / Self Care | Attending: Orthopedic Surgery | Admitting: Orthopedic Surgery

## 2010-08-30 LAB — TYPE AND SCREEN
ABO/RH(D): A POS
Antibody Screen: NEGATIVE

## 2010-08-30 LAB — ABO/RH: ABO/RH(D): A POS

## 2010-08-31 LAB — BASIC METABOLIC PANEL
BUN: 9 mg/dL (ref 6–23)
CO2: 26 mEq/L (ref 19–32)
Calcium: 8.3 mg/dL — ABNORMAL LOW (ref 8.4–10.5)
Chloride: 102 mEq/L (ref 96–112)
Creatinine, Ser: 0.63 mg/dL (ref 0.4–1.2)
GFR calc Af Amer: >60 mL/min (ref >60)
GFR calc non Af Amer: >60 mL/min (ref >60)
Glucose, Bld: 167 mg/dL — ABNORMAL HIGH (ref 70–99)
Potassium: 3.8 mEq/L (ref 3.5–5.1)
Sodium: 135 mEq/L (ref 135–145)

## 2010-08-31 LAB — CBC
HCT: 34.4 % — ABNORMAL LOW (ref 36.0–46.0)
Hemoglobin: 11.2 g/dL — ABNORMAL LOW (ref 12.0–15.0)
MCH: 29.3 pg (ref 26.0–34.0)
MCHC: 32.6 g/dL (ref 30.0–36.0)
MCV: 90.1 fL (ref 78.0–100.0)
Platelets: 200 10*3/uL (ref 150–400)
RBC: 3.82 MIL/uL — ABNORMAL LOW (ref 3.87–5.11)
RDW: 12.6 % (ref 11.5–15.5)
WBC: 11.2 10*3/uL — ABNORMAL HIGH (ref 4.0–10.5)

## 2010-08-31 LAB — PROTIME-INR
INR: 1.14 (ref 0.00–1.49)
Prothrombin Time: 14.8 seconds (ref 11.6–15.2)

## 2010-09-01 LAB — PROTIME-INR
INR: 1.75 — ABNORMAL HIGH (ref 0.00–1.49)
Prothrombin Time: 20.6 seconds — ABNORMAL HIGH (ref 11.6–15.2)

## 2010-09-01 LAB — BASIC METABOLIC PANEL
BUN: 10 mg/dL (ref 6–23)
CO2: 27 mEq/L (ref 19–32)
Calcium: 8.6 mg/dL (ref 8.4–10.5)
Chloride: 106 mEq/L (ref 96–112)
Creatinine, Ser: 0.64 mg/dL (ref 0.4–1.2)
GFR calc Af Amer: >60 mL/min (ref >60)
GFR calc non Af Amer: >60 mL/min (ref >60)
Glucose, Bld: 124 mg/dL — ABNORMAL HIGH (ref 70–99)
Potassium: 4.1 mEq/L (ref 3.5–5.1)
Sodium: 139 mEq/L (ref 135–145)

## 2010-09-01 LAB — CBC
HCT: 28.4 % — ABNORMAL LOW (ref 36.0–46.0)
Hemoglobin: 9.1 g/dL — ABNORMAL LOW (ref 12.0–15.0)
MCH: 28.8 pg (ref 26.0–34.0)
MCHC: 32 g/dL (ref 30.0–36.0)
MCV: 89.9 fL (ref 78.0–100.0)
Platelets: 179 10*3/uL (ref 150–400)
RBC: 3.16 MIL/uL — ABNORMAL LOW (ref 3.87–5.11)
RDW: 12.8 % (ref 11.5–15.5)
WBC: 12.2 10*3/uL — ABNORMAL HIGH (ref 4.0–10.5)

## 2010-09-02 LAB — CBC
HCT: 28.9 % — ABNORMAL LOW (ref 36.0–46.0)
Hemoglobin: 9.3 g/dL — ABNORMAL LOW (ref 12.0–15.0)
MCH: 29.4 pg (ref 26.0–34.0)
MCHC: 32.2 g/dL (ref 30.0–36.0)
MCV: 91.5 fL (ref 78.0–100.0)
Platelets: 192 10*3/uL (ref 150–400)
RBC: 3.16 MIL/uL — ABNORMAL LOW (ref 3.87–5.11)
RDW: 12.7 % (ref 11.5–15.5)
WBC: 10.9 10*3/uL — ABNORMAL HIGH (ref 4.0–10.5)

## 2010-09-02 LAB — PROTIME-INR
INR: 1.86 — ABNORMAL HIGH (ref 0.00–1.49)
Prothrombin Time: 21.6 seconds — ABNORMAL HIGH (ref 11.6–15.2)

## 2010-09-03 LAB — PROTIME-INR
INR: 1.51 — ABNORMAL HIGH (ref 0.00–1.49)
Prothrombin Time: 18.4 seconds — ABNORMAL HIGH (ref 11.6–15.2)

## 2010-09-04 LAB — PROTIME-INR
INR: 1.78 — ABNORMAL HIGH (ref 0.00–1.49)
Prothrombin Time: 20.9 seconds — ABNORMAL HIGH (ref 11.6–15.2)

## 2010-09-05 NOTE — Letter (Signed)
Summary: lipid reminder  [facility], Main Office  [address]   [place], [place] [address]   Phone: [phone]  Fax: [phone]        January 31, 2009 MRN: [id number]    [facility] [address] [place], [place]  [address]    Dear Ms. [facility],  Our records indicate it is time to check your cholesterol.  Please call our office to schedule an appt for labwork.  Please remember it is a fasting lab.     Sincerely,  Meredith Staggers, RN  This letter has been electronically signed by your physician.

## 2010-09-05 NOTE — Assessment & Plan Note (Signed)
Summary: st/njr   Vital Signs:  Patient profile:   70 year old female Menstrual status:  postmenopausal Weight:      162 pounds Temp:     98.4 degrees F oral Pulse rate:   72 / minute BP sitting:   110 / 60  (right arm) Cuff size:   regular  Vitals Entered By: Romualdo Bolk, CMA (AAMA) (February 24, 2010 2:28 PM) CC: Pt had a sore throat and fatigue for 6 days but now is fine. Pt thought she would come in to discuss what to do as far as preventive care.     Menstrual Status postmenopausal Last PAP Result normal   History of Present Illness: [name] comes in today  for acute problem see above.  Last offive visit  was in october 2008 . since that time she has been foloowed by her specialist .   Dr Myrtis Ser cards and Dr Herma Carson rheum.  Had severe sore throat   for about 6 days.  but no uri or couhg with this  and used  otc throat signs   No soreness  and then red.  Not a lot of  child exposure.       Had Mohs surgery   last week. [facility]   derm  Dr Irene Limbo. PMR  off  prednisone.  Has been on  med for osteoporosis  Bone :  reclast for 2 year  .       holding this year.  Due for   Mammo,  colonscopy ,  Hearing and vision doing well nl hearing.     Preventive Care Screening  Pap Smear:    Date:  07/06/2009    Results:  normal   Mammogram:    Date:  03/06/2009    Results:  normal   Last Tetanus Booster:    Date:  08/06/2004    Results:  Historical   Colonoscopy:    Date:  08/07/2003    Results:  normal    Preventive Screening-Counseling & Management  Alcohol-Tobacco     Alcohol drinks/day: 0     Smoking Status: never  Caffeine-Diet-Exercise     Caffeine use/day: 2     Does Patient Exercise: yes     Type of exercise: treadmill and bike     Times/week: 5  Current Medications (verified): 1)  Enalapril Maleate 20 Mg Tabs (Enalapril Maleate) .... Take 1 Tablet By Mouth Twice A Day 2)  Potassium Chloride Crys Cr 20 Meq Tbcr (Potassium Chloride Crys Cr) .... 3 Tablets  in The Am  2 Tablets in The Pm 3)  Doryx 75 Mg  Tbec (Doxycycline Hyclate) .... 3 Times Per Week 4)  Furosemide 20 Mg  Tabs (Furosemide) .Marland Kitchen.. 1 By Mouth Once Daily 5)  Coreg 12.5 Mg  Tabs (Carvedilol) .Marland Kitchen.. 1 1/2 Tabs   Two Times A Day 6)  Cozaar 50 Mg  Tabs (Losartan Potassium) .Marland Kitchen.. 1 By Mouth  Once Daily 7)  Norvasc 5 Mg  Tabs (Amlodipine Besylate) .Marland Kitchen.. 1 By Mouth Once Daily 8)  Lipitor 10 Mg  Tabs (Atorvastatin Calcium) .Marland Kitchen.. 1 By Mouth Once Daily 9)  Amitriptyline Hcl 25 Mg  Tabs (Amitriptyline Hcl) .Marland Kitchen.. 1-2 By Mouth Once Daily 10)  Baby Aspirin 81 Mg  Chew (Aspirin) 11)  Caltrate 600+d 600-400 Mg-Unit  Tabs (Calcium Carbonate-Vitamin D) 12)  Glucosamine-Chondroitin 750-600 Mg  Tabs (Glucosamine-Chondroitin) 13)  Biotin Forte 5 Mg  Tabs (Biotin) 14)  Evening Primrose   Oil (Evening Primrose) 15)  Mobic 15 Mg Tabs (Meloxicam) .... Take One Tablet By Mouth Once Daily. 16)  Reclast 5 Mg/178ml Soln (Zoledronic Acid) .... Yearly 17)  Vitmain A 25000iu .... Once Daily For 3 Mths Then Off For 1 Mth and Repeat  Allergies (verified): 1)  ! Codeine  Past History:  Past Medical History:  Osteoporosis Hyperlipidemia polymyalgia rheumatica  hypertension  mitral valve prolapse. ......  Echo.... August 2008.... mild mitral regurgitation /  moderate prolapse posterior leaflet... trivial MR.... echo.... January, 2011  tricuspid regurgitation   mild to moderate.... echo... January, 2011 Right ventricular  pressure in the range of 30 mmHg. 2008  /   PA pressure... 38 mmHg.... January, 2011 EF 65%... echo.... August 24, 2009   Prednisone therapy for her polymyalgia rheumatica.  High potassium requirement over time.  Syncope in August 2008 due to dehydration. Marland KitchenHeadache with findings of a left subdural.  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 Diarrhea in August   resolved. 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  Skin cancer   Past Surgical History: Hemorrhoidectomy Mohs surgery 2011  Past History:  Care Management: Cardiology: Myrtis Ser   Gastroenterology: Leone Payor Gynecology: Dianne Dun Rheumatology: Mallie Mussel(  Mamie Nick. )  Dermatology:[place]  Social History: Caffeine use/day:  2  Review of Systems  The patient denies anorexia, fever, weight loss, weight gain, vision loss, decreased hearing, hoarseness, peripheral edema, prolonged cough, hemoptysis, abdominal pain, melena, hematochezia, severe indigestion/heartburn, hematuria, transient blindness, difficulty walking, abnormal bleeding, enlarged lymph nodes, and angioedema.    Physical Exam  General:  alert, well-developed, well-nourished, and well-hydrated.   Head:  normocephalic and atraumatic.   Eyes:  vision grossly intact, pupils equal, and pupils round.   Ears:  R ear normal, L ear normal, and no external deformities.   Mouth:  pharynx pink and moist.   noedema or lesion Neck:  No deformities, masses, or tenderness noted. Lungs:  Normal respiratory effort, chest expands symmetrically. Lungs are clear to auscultation, no crackles or wheezes. Heart:  cardiac exam reveals S1 and S2.  There is a soft systolic murmur.no lifts.   Abdomen:  Bowel sounds positive,abdomen soft and non-tender without masses, organomegaly or   noted. Msk:  no joint swelling, no joint warmth, and no redness over joints.   Pulses:  pulses intact without delay   Extremities:  no clubbing cyanosis or edema  Neurologic:  alert & oriented X3, strength normal in all extremities, and gait normal.   Skin:  turgor normal, color normal, no ecchymoses, and no petechiae.   Cervical Nodes:  No lymphadenopathy noted Psych:  Normal eye contact, appropriate affect. Cognition appears normal.    Impression & Recommendations:  Problem # 1:  SORE THROAT (ICD-462) improved but  exposed to child .   and no uti signs  so do strep screenand  if neg just observe . presumed viral and resolved  Her updated medication list for this problem includes:    Doryx 75 Mg Tbec (Doxycycline hyclate) .Marland KitchenMarland KitchenMarland KitchenMarland Kitchen 3 times per week    Baby Aspirin 81 Mg Chew (Aspirin)    Mobic 15 Mg Tabs (Meloxicam) .Marland Kitchen... Take one tablet by mouth once daily.  Problem # 2:  HYPERTENSION (ICD-401.9) hard to swallow the potassium pills but not related to above  Her updated medication list for this problem includes:    Enalapril Maleate 20 Mg Tabs (Enalapril maleate) .Marland KitchenMarland KitchenMarland KitchenMarland Kitchen  Take 1 tablet by mouth twice a day    Furosemide 20 Mg Tabs (Furosemide) .Marland Kitchen... 1 by mouth once daily    Coreg 12.5 Mg Tabs (Carvedilol) .Marland Kitchen... 1 1/2 tabs   two times a day    Cozaar 50 Mg Tabs (Losartan potassium) .Marland Kitchen... 1 by mouth  once daily    Norvasc 5 Mg Tabs (Amlodipine besylate) .Marland Kitchen... 1 by mouth once daily  BP today: 110/60 Prior BP: 128/88 (02/16/2010)  Labs Reviewed: K+: 4.2 (08/26/2009) Creat: : 0.6 (08/26/2009)   Chol: 160 (08/26/2009)   HDL: 75.90 (08/26/2009)   LDL: 71 (08/26/2009)   TG: 65.0 (08/26/2009)  Problem # 3:  MITRAL VALVE PROLAPSE (ICD-424.0) with some  trivial TR   followed by dr  Myrtis Ser  .   Her updated medication list for this problem includes:    Coreg 12.5 Mg Tabs (Carvedilol) .Marland Kitchen... 1 1/2 tabs   two times a day    Baby Aspirin 81 Mg Chew (Aspirin)  Problem # 4:  POLYMYALGIA RHEUMATICA (ICD-725) now off  steroids   doing much better  Her updated medication list for this problem includes:    Baby Aspirin 81 Mg Chew (Aspirin)    Mobic 15 Mg Tabs (Meloxicam) .Marland Kitchen... Take one tablet by mouth once daily.  Diagnostics Reviewed:  Hgb: 13.2 (10/08/2006)   HCT: 40.0 (10/08/2006)   Platelets: 355 (10/08/2006) RBC: 4.52 (10/08/2006)   WBC: 7.6 (10/08/2006)  Problem # 5:  HYPERLIPIDEMIA (ICD-272.4)  Her updated medication list for this problem includes:    Lipitor 10 Mg Tabs (Atorvastatin calcium) .Marland Kitchen... 1 by mouth once daily  Labs Reviewed: SGOT: 22 (08/26/2009)   SGPT: 20  (08/26/2009)   HDL:75.90 (08/26/2009), 68.50 (02/02/2009)  LDL:71 (08/26/2009), 72 (02/02/2009)  Chol:160 (08/26/2009), 152 (02/02/2009)  Trig:65.0 (08/26/2009), 59.0 (02/02/2009)  Problem # 6:  Preventive Health Care (ICD-V70.0) seems utd of parameters  reviewed  and counseled   Complete Medication List: 1)  Enalapril Maleate 20 Mg Tabs (Enalapril maleate) .... Take 1 tablet by mouth twice a day 2)  Potassium Chloride Crys Cr 20 Meq Tbcr (Potassium chloride crys cr) .... 3 tablets in the am  2 tablets in the pm 3)  Doryx 75 Mg Tbec (Doxycycline hyclate) .... 3 times per week 4)  Furosemide 20 Mg Tabs (Furosemide) .Marland Kitchen.. 1 by mouth once daily 5)  Coreg 12.5 Mg Tabs (Carvedilol) .Marland Kitchen.. 1 1/2 tabs   two times a day 6)  Cozaar 50 Mg Tabs (Losartan potassium) .Marland Kitchen.. 1 by mouth  once daily 7)  Norvasc 5 Mg Tabs (Amlodipine besylate) .Marland Kitchen.. 1 by mouth once daily 8)  Lipitor 10 Mg Tabs (Atorvastatin calcium) .Marland Kitchen.. 1 by mouth once daily 9)  Amitriptyline Hcl 25 Mg Tabs (Amitriptyline hcl) .Marland Kitchen.. 1-2 by mouth once daily 10)  Baby Aspirin 81 Mg Chew (Aspirin) 11)  Caltrate 600+d 600-400 Mg-unit Tabs (Calcium carbonate-vitamin d) 12)  Glucosamine-chondroitin 750-600 Mg Tabs (Glucosamine-chondroitin) 13)  Biotin Forte 5 Mg Tabs (Biotin) 14)  Evening Primrose Oil (Evening primrose) 15)  Mobic 15 Mg Tabs (Meloxicam) .... Take one tablet by mouth once daily. 16)  Reclast 5 Mg/164ml Soln (Zoledronic acid) .... Yearly 17)  Vitmain A 25000iu  .... Once daily for 3 mths then off for 1 mth and repeat  Patient Instructions: 1)  No    intervention but if Sore throat comes back call us.   2)  Ask pharmacist   about different  POtassium   suppl that might be easier to swallow.  3)  Get your mammogram  when due.  4)  Keep [place] informed  in the meantime.      Immunization History:  Zostavax History:    Zostavax # 1:  zostavax (01/06/2010)

## 2010-09-05 NOTE — Assessment & Plan Note (Signed)
Summary: follow up/ssc   Vital Signs:  Patient Profile:   70 Years Old Female Weight:      169 pounds Pulse rate:   78 / minute BP sitting:   110 / 80  (left arm) Cuff size:   regular  Vitals Entered By: Romualdo Bolk, CMA (May 07, 2007 1:47 PM)                 Chief Complaint:  Follow up.  History of Present Illness: [name] i s following up for   diarrhea and is some better after a week of flagyl. ended last week. ? would more help.  some of symtopms increasing again.  No blood fever or weight loss.   No one else is sick  ? celiac  S/P skull fracture and hyponatremis.   Much better without headache or instability.  Labs at Dr. Phylliss Bob arae good  PMR     Stable and crp is down to 1  HT controlled.  Has noticed increased hair shedding recently   has hx of patchy alopecia bu this is different.       Current Allergies (reviewed today): ! CODEINE   Social History:    Reviewed history and no changes required:       Married       Never Smoked   Risk Factors:  Tobacco use:  never   Review of Systems       see hpi   Physical Exam  General:     alert, well-developed, well-nourished, and well-hydrated.  In NAD looks well Head:     normocephalic.  minimal abnormality at occiput Eyes:     vision grossly intact, pupils equal, and pupils round.   Neck:     No deformities, masses, or tenderness noted. Lungs:     Normal respiratory effort, chest expands symmetrically. Lungs are clear to auscultation, no crackles or wheezes. Heart:     Normal rate and regular rhythm. S1 and S2 normal without gallop, murmur, click, rub or other extra sounds. Abdomen:     Bowel sounds positive,abdomen soft and non-tender without masses, organomegaly or hernias noted. Neurologic:     non focal Skin:     no aacute rashes  hair looks nl no patches of alopecia Cervical Nodes:     No lymphadenopathy noted Psych:     normally interactive, good eye contact, and  not anxious appearing.   Additional Exam:     see labs Dr Phylliss Bob all normalincluding crp of 1    Impression & Recommendations:  Problem # 1:  DIARRHEA, INFECTIOUS (ICD-009.2) Unclear etiology but possibley post infectious.  flagyl helped bsome but relapsing   will try longer course of med and if continuing rec referr to GI  Dr. Drenda Freeze. Sam retired)   lot [id number], EXP 30 jun 09, [employer] left deltoid IM, 0.5 cc.   Problem # 2:  POLYMYALGIA RHEUMATICA (ICD-725) stable Her updated medication list for this problem includes:    Prednisone 5 Mg Tabs (Prednisone) .Marland Kitchen... 7.5 mg once daily    Baby Aspirin 81 Mg Chew (Aspirin)   Problem # 3:  HYPERTENSION (ICD-401.9) stable Her updated medication list for this problem includes:    Enalapril Maleate 20 Mg Tabs (Enalapril maleate) .Marland Kitchen... Take 1 tablet by mouth twice a day    Toprol Xl 100 Mg Tb24 (Metoprolol succinate) .Marland Kitchen... Take 1/2 tablet by mouth once a day    Furosemide 20 Mg Tabs (Furosemide) .Marland Kitchen... 1 by  mouth once daily    Coreg 12.5 Mg Tabs (Carvedilol) .Marland Kitchen... 1 po two times a day    Cozaar 50 Mg Tabs (Losartan potassium) .Marland Kitchen... 1 by mouth  once daily    Norvasc 5 Mg Tabs (Amlodipine besylate) .Marland Kitchen... 1 by mouth once daily   Problem # 4:  FX CLSD SKL VLT W/HEM NEC, CONCUSSION NOS (ICD-800.39) resolved  Complete Medication List: 1)  Enalapril Maleate 20 Mg Tabs (Enalapril maleate) .... Take 1 tablet by mouth twice a day 2)  Potassium Chloride Crys Cr 20 Meq Tbcr (Potassium chloride crys cr) .... Take 5 tablet by mouth 3)  Prednisone 5 Mg Tabs (Prednisone) .... 7.5 mg once daily 4)  Toprol Xl 100 Mg Tb24 (Metoprolol succinate) .... Take 1/2 tablet by mouth once a day 5)  Doryx 75 Mg Tbec (Doxycycline hyclate) .Marland Kitchen.. 1 once daily 6)  Furosemide 20 Mg Tabs (Furosemide) .Marland Kitchen.. 1 by mouth once daily 7)  Coreg 12.5 Mg Tabs (Carvedilol) .Marland Kitchen.. 1 po two times a day 8)  Cozaar 50 Mg Tabs (Losartan potassium) .Marland Kitchen.. 1 by mouth  once daily 9)   Norvasc 5 Mg Tabs (Amlodipine besylate) .Marland Kitchen.. 1 by mouth once daily 10)  Lipitor 10 Mg Tabs (Atorvastatin calcium) .Marland Kitchen.. 1 by mouth once daily 11)  Amitriptyline Hcl 25 Mg Tabs (Amitriptyline hcl) .Marland Kitchen.. 1-2 by mouth once daily 12)  Fosamax 70 Mg Tabs (Alendronate sodium) .Marland Kitchen.. 1 by mouth every week 13)  Baby Aspirin 81 Mg Chew (Aspirin) 14)  Caltrate 600+d 600-400 Mg-unit Tabs (Calcium carbonate-vitamin d) 15)  Glucosamine-chondroitin 750-600 Mg Tabs (Glucosamine-chondroitin) 16)  Biotin Forte 5 Mg Tabs (Biotin) 17)  Evening Primrose Oil (Evening primrose) 18)  Tumeric  19)  Metronidazole 500 Mg Tabs (Metronidazole) .Marland Kitchen.. 1 by mouth three times a day  Other Orders: Flu Vaccine 20yrs + (16109) Admin 1st Vaccine (60454)     Prescriptions: METRONIDAZOLE 500 MG  TABS (METRONIDAZOLE) 1 by mouth three times a day  #30 x 0   Entered and Authorized by:   Madelin Headings MD   Signed by:   Madelin Headings MD on 05/07/2007   Method used:   Electronically sent to ...       [facility]       [address] [place], [place]  [address]       Ph: [phone]       Fax: [phone]   RxID:   [id number]  ]  Influenza Vaccine    Vaccine Type: Fluvax 3+    Given by: Romualdo Bolk, CMA  Flu Vaccine Consent Questions    Do you have a history of severe allergic reactions to this vaccine? no    Any prior history of allergic reactions to egg and/or gelatin? no    Do you have a sensitivity to the preservative Thimersol? no    Do you have a past history of Guillan-Barre Syndrome? no    Do you currently have an acute febrile illness? no    Have you ever had a severe reaction to latex? no    Vaccine information given and explained to patient? yes    Are you currently pregnant? no

## 2010-09-05 NOTE — Assessment & Plan Note (Signed)
Summary: flu shot/njr/pt rescd//ccm  Nurse Visit  Flu Vaccine Consent Questions     Do you have a history of severe allergic reactions to this vaccine? no    Any prior history of allergic reactions to egg and/or gelatin? no    Do you have a sensitivity to the preservative Thimersol? no    Do you have a past history of Guillan-Barre Syndrome? no    Do you currently have an acute febrile illness? no    Have you ever had a severe reaction to latex? no    Vaccine information given and explained to patient? yes    Are you currently pregnant? no    Lot Number:[id number]   Exp Date:02/02/2010   Site Given  Left Deltoid IM  Allergies: 1)  ! Codeine  Orders Added: 1)  Flu Vaccine 36yrs + [90658] 2)  Administration Flu vaccine - MCR [G0008]

## 2010-09-05 NOTE — Progress Notes (Signed)
Summary: sick x 1+ week - need help  Phone Note Call from Patient Call back at [facility] Phone [phone]   Caller: Patient Call For: Lashuna Tamashiro Summary of Call: Still having headache, stomach upset. Vomited yesterday evening well into the night. I need Dr. Fabian Sharp to look into this Initial call taken by: Barnie Mort,  March 25, 2007 8:47 AM  Follow-up for Phone Call        Does she have a fever?. Hydrocodone can cause a headache so I would definitely y stop this if she is still taking it. Has she been on antibiotics recently?  If she is allowed to do advil she can try this. May need ov to evaluate if not better off the narcotic. Follow-up by: Madelin Headings MD,  March 25, 2007 12:47 PM  Additional Follow-up for Phone Call Additional follow up Details #1::        No fever.  Will stop the hydrocodone.  Still nauseated.  Used husband's promethazine.  Cannot toerate liquids.  No antibiotics, but is on prednisone for PMR.  Wonders if she's now dehydrated.  Advised ER to evaluate hydration this pm. Says when she fainted last week she hit her head.  Head is sore.  ER to evaluate head trauma.  Weak.  Ov tomorrow if not to ER for further evaluation.     Additional Follow-up by: Rudy Jew, RN,  March 25, 2007 4:36 PM

## 2010-09-05 NOTE — Progress Notes (Signed)
Summary: calling about generic brand of lipitor  Phone Note Call from Patient Call back at Home Phone [phone]   Caller: Patient Summary of Call: Pt calling regardiing getting Lipitor in a generic brand sent to [facility] Initial call taken by: Judie Grieve,  June 20, 2010 10:09 AM  Follow-up for Phone Call        spoke w/pt rx sent for generic lipitor Meredith Staggers, RN  June 20, 2010 12:04 PM     New/Updated Medications: LIPITOR 10 MG  TABS (ATORVASTATIN CALCIUM) 1 by mouth once daily Prescriptions: LIPITOR 10 MG  TABS (ATORVASTATIN CALCIUM) 1 by mouth once daily  #90 x 3   Entered by:   Meredith Staggers, RN   Authorized by:   Talitha Givens, MD, [facility]   Signed by:   Meredith Staggers, RN on 06/20/2010   Method used:   Faxed to ...       [facility] (mail-order)       [address]       [place], [place]  [id number]       Ph: [phone]       Fax: [phone]   RxID:   [id number]

## 2010-09-05 NOTE — Progress Notes (Signed)
Summary: refill meds  Phone Note Refill Request Call back at Home Phone [phone] Message from:  Patient on May 16, 2009 11:02 AM  Refills Requested: Medication #1:  POTASSIUM CHLORIDE CRYS CR 20 MEQ TBCR Take 5 tablet by mouth   Supply Requested: 3 months atena fax @#[phone] / [phone] - rx # / member id # [id number]. pls call pt when it's done.    Method Requested: Fax to [facility] Initial call taken by: Lorne Skeens,  May 16, 2009 11:03 AM    New/Updated Medications: POTASSIUM CHLORIDE CRYS CR 20 MEQ TBCR (POTASSIUM CHLORIDE CRYS CR) 3 tablets in the AM  2 tablets in the PM Prescriptions: POTASSIUM CHLORIDE CRYS CR 20 MEQ TBCR (POTASSIUM CHLORIDE CRYS CR) 3 tablets in the AM  2 tablets in the PM  #450 x 3   Entered by:   Laurance Flatten CMA   Authorized by:   Talitha Givens, MD, [facility]   Signed by:   Laurance Flatten CMA on 05/18/2009   Method used:   Faxed to ...       [facility] (mail-order)             , [place]         Ph: [phone]       Fax: [phone]   RxID:   [id number]

## 2010-09-05 NOTE — Progress Notes (Signed)
Summary: shingles?  Phone Note Call from Patient   Caller: Patient Call For: Madelin Headings MD Summary of Call: Would like to know if she should have a shingles vaccine and where? [phone]   Initial call taken by: Lynann Beaver CMA,  Dec 29, 2009 12:33 PM  Follow-up for Phone Call        good idea to get it but it is on manufacturing  back order.  can ask the health department or the pharmacies that give vaccines, Follow-up by: Madelin Headings MD,  Dec 29, 2009 4:02 PM  Additional Follow-up for Phone Call Additional follow up Details #1::        Left detailed message on personal voice mail. Additional Follow-up by: Lynann Beaver CMA,  Dec 29, 2009 4:47 PM

## 2010-09-05 NOTE — Assessment & Plan Note (Signed)
Summary: 6 mo f/u   Visit Type:  Follow-up Primary Provider:  Madelin Headings MD  CC:  mitral valve prolapse.  History of Present Illness: The patient is seen for followup of mitral valve the left.  I saw her last January, 2010.  Her lipid profile at that time was excellent.  Her HDL is high and her LDL is low.  Liver functions were normal.  She did not have any chest pain.  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  She's not had any recurrent syncope or presyncope.  Two-dimensional echo was done in January, 2011.  She has excellent left ventricular function.  She has mild to moderate mitral prolapse but there is only trivial mitral regurgitation.  There is mild regurgitation.   Current Medications (verified): 1)  Enalapril Maleate 20 Mg Tabs (Enalapril Maleate) .... Take 1 Tablet By Mouth Twice A Day 2)  Potassium Chloride Crys Cr 20 Meq Tbcr (Potassium Chloride Crys Cr) .... 3 Tablets in The Am  2 Tablets in The Pm 3)  Doryx 75 Mg  Tbec (Doxycycline Hyclate) .... 3 Times Per Week 4)  Furosemide 20 Mg  Tabs (Furosemide) .Marland Kitchen.. 1 By Mouth Once Daily 5)  Coreg 12.5 Mg  Tabs (Carvedilol) .Marland Kitchen.. 1 1/2 Tabs   Two Times A Day 6)  Cozaar 50 Mg  Tabs (Losartan Potassium) .Marland Kitchen.. 1 By Mouth  Once Daily 7)  Norvasc 5 Mg  Tabs (Amlodipine Besylate) .Marland Kitchen.. 1 By Mouth Once Daily 8)  Lipitor 10 Mg  Tabs (Atorvastatin Calcium) .Marland Kitchen.. 1 By Mouth Once Daily 9)  Amitriptyline Hcl 25 Mg  Tabs (Amitriptyline Hcl) .Marland Kitchen.. 1-2 By Mouth Once Daily 10)  Baby Aspirin 81 Mg  Chew (Aspirin) 11)  Caltrate 600+d 600-400 Mg-Unit  Tabs (Calcium Carbonate-Vitamin D) 12)  Glucosamine-Chondroitin 750-600 Mg  Tabs (Glucosamine-Chondroitin) 13)  Biotin Forte 5 Mg  Tabs (Biotin) 14)  Evening Primrose   Oil (Evening Primrose) 15)  Mobic 15 Mg Tabs (Meloxicam) .... Take One Tablet By Mouth Once Daily. 16)  Reclast 5 Mg/181ml Soln (Zoledronic Acid) .... Yearly 17)  Vitmain A 25000iu .... Once Daily For 3 Mths Then  Off For 1 Mth and Repeat  Allergies (verified): 1)  ! Codeine  Past History:  Past Medical History:  Osteoporosis Hyperlipidemia polymyalgia rheumatica  hypertension  mitral valve prolapse. ......  Echo.... August 2008.... mild mitral regurgitation /  moderate prolapse posterior leaflet... trivial MR.... echo.... January, 2011  tricuspid regurgitation   mild to moderate.... echo... January, 2011 Right ventricular  pressure in the range of 30 mmHg. 2008  /   PA pressure... 38 mmHg.... January, 2011 EF 65%... echo.... August 24, 2009   Prednisone therapy for her polymyalgia rheumatica.  High potassium requirement over time.  Syncope in August 2008 due to dehydration. Marland KitchenHeadache with findings of a left subdural.  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 Diarrhea in August   resolved. 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   Review of Systems       Patient denies fever, chills, headache, sweats, rash, change in vision, change in hearing, chest pain, cough, nausea vomiting, urinary symptoms.  All other systems are reviewed and are negative  Vital Signs:  Patient profile:   70 year old female Height:      65 inches Weight:  162 pounds BMI:     27.06 Pulse rate:   63 / minute Resp:     16 per minute BP sitting:   128 / 88  (left arm)  Vitals Entered By: Marrion Coy, CNA (February 16, 2010 2:45 PM)  Physical Exam  General:  patient is stable. Eyes:  no xanthelasma. Nose:  she had a basal cell carcinoma removed just lateral to her nose. Neck:  no jugular venous distention. Lungs:  lungs are clear.  Respiratory effort is nonlabored. Heart:  cardiac exam reveals S1 and S2.  There is a soft systolic murmur. Abdomen:  abdomen is soft. Extremities:  no peripheral edema. Psych:  patient is oriented to person time and place.  Affect is normal.   Impression &  Recommendations:  Problem # 1:  SYNCOPE (ICD-780.2) There's been no recurrent syncope.  No further workup.  Problem # 2:  MITRAL VALVE PROLAPSE (ICD-424.0)  Echo shows moderate mitral valve prolapse but only trivial mitral regurgitation.  No further workup.  Orders: EKG w/ Interpretation (93000)  Problem # 3:  HYPERLIPIDEMIA (ICD-272.4)  Her updated medication list for this problem includes:    Lipitor 10 Mg Tabs (Atorvastatin calcium) .Marland Kitchen... 1 by mouth once daily The patient's lipids are excellent.  No change in therapy.  Six-month followup.  Patient Instructions: 1)  Your physician recommends that you return for a FASTING lipid, liver and bmet profile: 424.0, 272.0 in 6 months. 2)  Your physician wants you to follow-up in:  6 months.  You will receive a reminder letter in the mail two months in advance. If you don't receive a letter, please call our office to schedule the follow-up appointment.

## 2010-09-05 NOTE — Miscellaneous (Signed)
  Clinical Lists Changes  Observations: Added new observation of PAST MED HX:  Osteoporosis Hyperlipidemia polymyalgia rheumatica  hypertension  mitral valve prolapse. ......  Echo.... August 2008.... mild mitral regurgitation  moderate tricuspid regurgitation.  Right ventricular     pressure in the range of 30 mmHg.  Normal left ventricular function.I  Prednisone therapy for her polymyalgia rheumatica.  High potassium requirement over time.  Syncope in August 2008 due to dehydration. Marland KitchenHeadache with findings of a left subdural.  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 Diarrhea in August   resolved. 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   (08/12/2009 12:12) Added new observation of PRIMARY MD: Madelin Headings MD (08/12/2009 12:12)       Past History:  Past Medical History:  Osteoporosis Hyperlipidemia polymyalgia rheumatica  hypertension  mitral valve prolapse. ......  Echo.... August 2008.... mild mitral regurgitation  moderate tricuspid regurgitation.  Right ventricular     pressure in the range of 30 mmHg.  Normal left ventricular function.I  Prednisone therapy for her polymyalgia rheumatica.  High potassium requirement over time.  Syncope in August 2008 due to dehydration. Marland KitchenHeadache with findings of a left subdural.  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 Diarrhea in August   resolved. 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

## 2010-09-05 NOTE — Miscellaneous (Signed)
  Clinical Lists Changes  Problems: Added new problem of MITRAL VALVE PROLAPSE (ICD-424.0) Added new problem of TRICUSPID REGURGITATION (ICD-397.0) Added new problem of * PREDNISONE THERAPY Added new problem of * HIGH POTASSIUM REQUIREMENT Added new problem of SYNCOPE (ICD-780.2) Observations: Added new observation of PAST MED HX:  Osteoporosis Hyperlipidemia polymyalgia rheumatica  hypertension  mitral valve prolapse.  recent echocardiogram... prolapse,  but no significant MR  moderate tricuspid regurgitation.  Right ventricular     pressure in the range of 30 mmHg.  Normal left ventricular function.I  Prednisone therapy for her polymyalgia rheumatica.  High potassium requirement over time.  Syncope in August 2008 due to dehydration. Marland KitchenHeadache with findings of a left subdural.  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 Diarrhea in August   resolved. 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   (02/02/2009 15:24)       Past History:  Past Medical History:  Osteoporosis Hyperlipidemia polymyalgia rheumatica  hypertension  mitral valve prolapse.  recent echocardiogram... prolapse,  but no significant MR  moderate tricuspid regurgitation.  Right ventricular     pressure in the range of 30 mmHg.  Normal left ventricular function.I  Prednisone therapy for her polymyalgia rheumatica.  High potassium requirement over time.  Syncope in August 2008 due to dehydration. Marland KitchenHeadache with findings of a left subdural.  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 Diarrhea in August   resolved. 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

## 2010-09-05 NOTE — Progress Notes (Signed)
Summary: diarrhea, vomiting  Phone Note Call from Patient Call back at Home Phone [phone]   Caller: Patient Call For: panosh Summary of Call: pt c/o diarrhea since friday , vomiting started sat and sun, needs to get under control. please call  Initial call taken by: Calvert Cantor,  March 19, 2007 3:31 PM  Follow-up for Phone Call        Pt has not been on clear liquids.  Pt was instructed to stay on clear liquids, purchase Gatorade and sip on it, to stay hydrated.  Cont. with Immodium as directed and call back on 8/15 if sx persist.  Pt was encouraged to try to take all her regular meds as prescribed. Pt voiced her understanding. Follow-up by: Sid Falcon LPN,  March 19, 2007 5:51 PM

## 2010-09-05 NOTE — Miscellaneous (Signed)
  Clinical Lists Changes  Observations: Added new observation of PAST MED HX:  Osteoporosis Hyperlipidemia polymyalgia rheumatica  hypertension  mitral valve prolapse. ......  Echo.... August 2008.... mild mitral regurgitation /  moderate prolapse posterior leaflet... trivial MR.... echo.... January, 2011  tricuspid regurgitation   mild to moderate.... echo... January, 2011 Right ventricular  pressure in the range of 30 mmHg. 2008  /   PA pressure... 38 mmHg.... January, 2011 EF 65%... echo.... August 24, 2009   Prednisone therapy for her polymyalgia rheumatica.  High potassium requirement over time.  Syncope in August 2008 due to dehydration. Marland KitchenHeadache with findings of a left subdural.  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 Diarrhea in August   resolved. 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   (02/15/2010 18:09) Added new observation of PRIMARY MD: Madelin Headings MD (02/15/2010 18:09)       Past History:  Past Medical History:  Osteoporosis Hyperlipidemia polymyalgia rheumatica  hypertension  mitral valve prolapse. ......  Echo.... August 2008.... mild mitral regurgitation /  moderate prolapse posterior leaflet... trivial MR.... echo.... January, 2011  tricuspid regurgitation   mild to moderate.... echo... January, 2011 Right ventricular  pressure in the range of 30 mmHg. 2008  /   PA pressure... 38 mmHg.... January, 2011 EF 65%... echo.... August 24, 2009   Prednisone therapy for her polymyalgia rheumatica.  High potassium requirement over time.  Syncope in August 2008 due to dehydration. Marland KitchenHeadache with findings of a left subdural.  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 Diarrhea in August   resolved. 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

## 2010-09-05 NOTE — Assessment & Plan Note (Signed)
Summary: 3 MONTH ROA/JLS   Vital Signs:  Patient Profile:   70 Years Old Female Weight:      167 pounds Pulse rate:   66 / minute BP sitting:   100 / 60  (left arm) Cuff size:   regular  Vitals Entered By: Romualdo Bolk, CMA (April 02, 2007 2:14 PM)               Chief Complaint:  ROV on BS an d Post hospital F/U- pt fainted 2 weeks ago and diarrhea x 3 1/2 weeks.  History of Present Illness: [name]  is here for hospital follow-up.  She was hospitalized recently for headache nausea and vomiting after a prolonged diarrheal illness and syncope.  She was noted to have hyponatremia a somewhat chronic subdural hematoma evaluated by neurosurgery and felt that he could resolve on its own.  And some dehydration.  Discharge from the hospital her sodium level was about 129.  Her headache is better and her balance is better however she has continued diarrhea.    Diarrhea still 3x per day.  No blood or fever.  Stool checked in hospital. results pending.  Some caffiene no juices. No etoh.  Is supposed to have ct repeated within a week.    she is planning a trip to [facility] next week and wonders if she can still go.  She is still on prednisone 7.5 mg for her PMR. This is stable.  Current Allergies (reviewed today): ! CODEINE  Past Medical History:    Hypertension    Osteoporosis    Hyperlipidemia    polymyalgia rheumatica   Social History:    Reviewed history and no changes required:   Risk Factors:  Tobacco use:  never Alcohol use:  no Exercise:  yes    Times per week:  5    Type:  treadmill and bike Seatbelt use:  100 %   Review of Systems       as per HPI   Physical Exam  General:     alert, well-developed, well-nourished, and well-hydrated.  somewhat tired normal gait Lungs:     Normal respiratory effort, chest expands symmetrically. Lungs are clear to auscultation, no crackles or wheezes. Heart:     Normal rate and regular rhythm. S1 and S2  normal without gallop, murmur, click, rub or other extra sounds. Abdomen:     Bowel sounds positive,abdomen soft and non-tender without masses, organomegaly or hernias noted. Neurologic:     nonfocal gait appears within normal limits. Skin:     turgor normal and color normal.   Cervical Nodes:     No lymphadenopathy noted Psych:     normally interactive and good eye contact.      Impression & Recommendations:  Problem # 1:  DIARRHEA, INFECTIOUS (ICD-009.2) this appears to be a bit prolonged although appears to be less acute at present.  Culture and C. difficile evaluation is pending.  We may consider a peer at Flagyl if continuing.  Problem # 2:  HYPONATREMIA (ICD-276.1) this was felt to be secondary to her subdural hematoma and fainting and dehydration.  She has been back on her Lasix for two days and we will recheck her BMP today. 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 Orders: Radiology Referral (Radiology)   Problem # 4:  POLYMYALGIA RHEUMATICA (  ICD-725) as per rheumatology continue her medications. Her updated medication list for this problem includes:    Prednisone 5 Mg Tabs (Prednisone) .Marland Kitchen... 7.5 mg once daily    Baby Aspirin 81 Mg Chew (Aspirin)   Complete Medication List: 1)  Enalapril Maleate 20 Mg Tabs (Enalapril maleate) .... Take 1 tablet by mouth twice a day 2)  Potassium Chloride Crys Cr 20 Meq Tbcr (Potassium chloride crys cr) .... Take 5 tablet by mouth 3)  Prednisone 5 Mg Tabs (Prednisone) .... 7.5 mg once daily 4)  Toprol Xl 100 Mg Tb24 (Metoprolol succinate) .... Take 1/2 tablet by mouth once a day 5)  Doryx 75 Mg Tbec (Doxycycline hyclate) .Marland Kitchen.. 1 once daily 6)  Furosemide 20 Mg Tabs (Furosemide) .Marland Kitchen.. 1 by mouth once daily 7)  Coreg 12.5 Mg Tabs  (Carvedilol) .Marland Kitchen.. 1 po two times a day 8)  Cozaar 50 Mg Tabs (Losartan potassium) .Marland Kitchen.. 1 by mouth  once daily 9)  Norvasc 5 Mg Tabs (Amlodipine besylate) .Marland Kitchen.. 1 by mouth once daily 10)  Lipitor 10 Mg Tabs (Atorvastatin calcium) .Marland Kitchen.. 1 by mouth once daily 11)  Amitriptyline Hcl 25 Mg Tabs (Amitriptyline hcl) .Marland Kitchen.. 1-2 by mouth once daily 12)  Fosamax 70 Mg Tabs (Alendronate sodium) .Marland Kitchen.. 1 by mouth every week 13)  Baby Aspirin 81 Mg Chew (Aspirin) 14)  Caltrate 600+d 600-400 Mg-unit Tabs (Calcium carbonate-vitamin d) 15)  Glucosamine-chondroitin 750-600 Mg Tabs (Glucosamine-chondroitin) 16)  Biotin Forte 5 Mg Tabs (Biotin) 17)  Evening Primrose Oil (Evening primrose) 18)  Tumeric

## 2010-09-05 NOTE — Progress Notes (Signed)
Summary: REFILL MEDS   Phone Note Refill Request Call back at Home Phone [phone] Message from:  Patient on February 03, 2009 11:33 AM  Refills Requested: Medication #1:  ENALAPRIL MALEATE 20 MG TABS Take 1 tablet by mouth twice a day [facility] ORDER [phone] FAXE/ MEMBER ID [id number]   Method Requested: Fax to Mail Away Pharmacy Initial call taken by: Lorne Skeens,  February 03, 2009 11:34 AM      Prescriptions: ENALAPRIL MALEATE 20 MG TABS (ENALAPRIL MALEATE) Take 1 tablet by mouth twice a day  #180 x 1   Entered by:   Laurance Flatten CMA   Authorized by:   Talitha Givens, MD, [facility]   Signed by:   Laurance Flatten CMA on 02/03/2009   Method used:   Faxed to ...       [facility] (mail-order)             , [place]         Ph: [phone]       Fax: [phone]   RxID:   [id number] ENALAPRIL MALEATE 20 MG TABS (ENALAPRIL MALEATE) Take 1 tablet by mouth twice a day  #180 x 1   Entered by:   Laurance Flatten CMA   Authorized by:   Talitha Givens, MD, [facility]   Signed by:   Laurance Flatten CMA on 02/03/2009   Method used:   Historical   RxID:   [id number]

## 2010-09-05 NOTE — Progress Notes (Signed)
Summary: virus x 1wk.  now ha  Phone Note Call from Patient Call back at Home Phone [phone]   Caller: Patient Call For: [name] Reason for Call: Talk to Nurse Summary of Call: Woke up this morning with a headache took 2 tylenol w/no relief. Took 1 hydrocodone 1 hour ago still hurting and has diarrhea Pharmacy : [facility] [address] Initial call taken by: Barnie Mort,  March 24, 2007 2:44 PM  Follow-up for Phone Call        call to patient.  She saw Dr. Juliet Rude.  He said she has a virus.  Now headache keeping her from resting. In bed.  Using ice pack to head.  Will try Advil/Aleve.  Caution for more stomach upset.  Clear liquids in preference to foods.  No juice or milk.  Rest.   Requests Dr. Rosezella Florida  advice tomorrow.   Follow-up by: Rudy Jew, RN,  March 24, 2007 3:00 PM  Additional Follow-up for Phone Call Additional follow up Details #1::        see other phone note

## 2010-09-05 NOTE — Progress Notes (Signed)
Summary: refill request  Phone Note Refill Request Message from:  Patient on June 21, 2010 3:29 PM  Refills Requested: Medication #1:  FUROSEMIDE 20 MG  TABS 1 by mouth once daily  Medication #2:  COREG 12.5 MG  TABS 1 1/2 tabs   two times a day and amlodipine at [facility]   Method Requested: Fax to Local Pharmacy Initial call taken by: Glynda Jaeger,  June 21, 2010 3:31 PM    Prescriptions: NORVASC 5 MG  TABS (AMLODIPINE BESYLATE) 1 by mouth once daily  #90 x 3   Entered by:   Hardin Negus, RMA   Authorized by:   Talitha Givens, MD, [facility]   Signed by:   Hardin Negus, RMA on 06/22/2010   Method used:   Faxed to ...       [facility] (mail-order)       [address]       [place], [place]  [id number]       Ph: [phone]       Fax: [phone]   RxID:   [id number] COREG 12.5 MG  TABS (CARVEDILOL) 1 1/2 tabs   two times a day  #270 Tablet x 3   Entered by:   Hardin Negus, RMA   Authorized by:   Talitha Givens, MD, [facility]   Signed by:   Hardin Negus, RMA on 06/22/2010   Method used:   Faxed to ...       [facility] (mail-order)       [address]       [place], [place]  [id number]       Ph: [phone]       Fax: [phone]   RxID:   [id number] FUROSEMIDE 20 MG  TABS (FUROSEMIDE) 1 by mouth once daily  #90 x 3   Entered by:   Hardin Negus, RMA   Authorized by:   Talitha Givens, MD, [facility]   Signed by:   Hardin Negus, RMA on 06/22/2010   Method used:   Faxed to ...       [facility] (mail-order)       [address]       [place], [place]  [id number]       Ph: [phone]       Fax: [phone]   RxID:   [id number]

## 2010-09-05 NOTE — Assessment & Plan Note (Signed)
Summary: [name]   Visit Type:  Follow-up Primary Provider:  Madelin Headings MD  CC:  mitral regurgitation.  History of Present Illness: The patient is seen for cardiology followup.  She is feeling well.  She had a remote episode of syncope.  She has had no recurrence.  She does not have chest pain or shortness of breath.  There's been no major dizziness.   Current Medications (verified): 1)  Enalapril Maleate 20 Mg Tabs (Enalapril Maleate) .... Take 1 Tablet By Mouth Twice A Day 2)  Potassium Chloride Crys Cr 20 Meq Tbcr (Potassium Chloride Crys Cr) .... 3 Tablets in The Am  2 Tablets in The Pm 3)  Doryx 75 Mg  Tbec (Doxycycline Hyclate) .... 3 Times Per Week 4)  Furosemide 20 Mg  Tabs (Furosemide) .Marland Kitchen.. 1 By Mouth Once Daily 5)  Coreg 12.5 Mg  Tabs (Carvedilol) .Marland Kitchen.. 1 1/2 Tabs   Two Times A Day 6)  Cozaar 50 Mg  Tabs (Losartan Potassium) .Marland Kitchen.. 1 By Mouth  Once Daily 7)  Norvasc 5 Mg  Tabs (Amlodipine Besylate) .Marland Kitchen.. 1 By Mouth Once Daily 8)  Lipitor 10 Mg  Tabs (Atorvastatin Calcium) .Marland Kitchen.. 1 By Mouth Once Daily 9)  Amitriptyline Hcl 25 Mg  Tabs (Amitriptyline Hcl) .Marland Kitchen.. 1-2 By Mouth Once Daily 10)  Baby Aspirin 81 Mg  Chew (Aspirin) 11)  Caltrate 600+d 600-400 Mg-Unit  Tabs (Calcium Carbonate-Vitamin D) 12)  Glucosamine-Chondroitin 750-600 Mg  Tabs (Glucosamine-Chondroitin) 13)  Biotin Forte 5 Mg  Tabs (Biotin) 14)  Evening Primrose   Oil (Evening Primrose) 15)  Mobic 15 Mg Tabs (Meloxicam) .... Take One Tablet By Mouth Once Daily. 16)  Reclast 5 Mg/134ml Soln (Zoledronic Acid) .... Yearly 17)  Vitmain A 25000iu .... Once Daily For 3 Mths Then Off For 1 Mth and Repeat  Allergies (verified): 1)  ! Codeine  Past History:  Past Medical History: Last updated: 08/12/2009  Osteoporosis Hyperlipidemia polymyalgia rheumatica  hypertension  mitral valve prolapse. ......  Echo.... August 2008.... mild mitral regurgitation  moderate tricuspid regurgitation.  Right ventricular     pressure in  the range of 30 mmHg.  Normal left ventricular function.I  Prednisone therapy for her polymyalgia rheumatica.  High potassium requirement over time.  Syncope in August 2008 due to dehydration. Marland KitchenHeadache with findings of a left subdural.  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 Diarrhea in August   resolved. 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   Review of Systems       Patient denies fever, chills, headache, sweats, rash, change in vision, change in hearing, chest pain, shortness of breath, cough, nausea vomiting, urinary symptoms.  All other systems are reviewed and are negative.  Vital Signs:  Patient profile:   70 year old female Height:      65 inches Weight:      167 pounds BMI:     27.89 Pulse rate:   67 / minute BP sitting:   114 / 78  (left arm) Cuff size:   regular  Vitals Entered By: Hardin Negus, RMA (August 12, 2009 3:18 PM)  Physical Exam  General:  patient is stable today. Eyes:  no xanthelasma Neck:  no jugular venous distention. Lungs:  lungs are clear.  Respiratory effort is nonlabored. Heart:  cardiac exam reveals an S1-S2.  No clicks or significant murmurs. Abdomen:  abdomen is soft. Extremities:  no peripheral edema. Psych:  patient is oriented to person time and place.  Affect is normal.   Impression & Recommendations:  Problem # 1:  SYNCOPE (ICD-780.2)  Her updated medication list for this problem includes:    Enalapril Maleate 20 Mg Tabs (Enalapril maleate) .Marland Kitchen... Take 1 tablet by mouth twice a day    Coreg 12.5 Mg Tabs (Carvedilol) .Marland Kitchen... 1 1/2 tabs   two times a day    Norvasc 5 Mg Tabs (Amlodipine besylate) .Marland Kitchen... 1 by mouth once daily    Baby Aspirin 81 Mg Chew (Aspirin)  Orders: EKG w/ Interpretation (93000) There's been no recurrent syncope.  EKG is done today and reviewed by me.  It is normal.  Problem # 2:   HYPERLIPIDEMIA (ICD-272.4)  Her updated medication list for this problem includes:    Lipitor 10 Mg Tabs (Atorvastatin calcium) .Marland Kitchen... 1 by mouth once daily We will arrange for followup lipid studies.  Problem # 3:  MITRAL VALVE PROLAPSE (ICD-424.0)  Her updated medication list for this problem includes:    Enalapril Maleate 20 Mg Tabs (Enalapril maleate) .Marland Kitchen... Take 1 tablet by mouth twice a day    Furosemide 20 Mg Tabs (Furosemide) .Marland Kitchen... 1 by mouth once daily    Coreg 12.5 Mg Tabs (Carvedilol) .Marland Kitchen... 1 1/2 tabs   two times a day    Cozaar 50 Mg Tabs (Losartan potassium) .Marland Kitchen... 1 by mouth  once daily The patient has mitral valve prolapse.  Last echo was done in August 2 008.  It is now time to repeat this and this will be arranged.  We'll be in touch with her with information.  Cardiology followup in 6 months.  Orders: Echocardiogram (Echo)  Patient Instructions: 1)  Your physician recommends that you return for a FASTING lipid, liver, and BMET profile:  dx 272.2 2)  Your physician has requested that you have an echocardiogram.  Echocardiography is a painless test that uses sound waves to create images of your heart. It provides your doctor with information about the size and shape of your heart and how well your heart's chambers and valves are working.  This procedure takes approximately one hour. There are no restrictions for this procedure. 3)  Follow up in 6 months Prescriptions: NORVASC 5 MG  TABS (AMLODIPINE BESYLATE) 1 by mouth once daily  #90 x 3   Entered by:   Hardin Negus, RMA   Authorized by:   Talitha Givens, MD, [facility]   Signed by:   Hardin Negus, RMA on 08/12/2009   Method used:   Electronically to        [facility] (retail)       [address] [place], [place]  [address]       Ph: [phone]       Fax: [phone]   RxID:   [id number] FUROSEMIDE 20 MG  TABS (FUROSEMIDE) 1 by mouth once daily  #90 x 3   Entered  by:   Hardin Negus, RMA   Authorized by:   Talitha Givens, MD, [facility]   Signed by:   Hardin Negus, RMA on 08/12/2009   Method used:   Electronically to        [facility] (retail)       [address] [place]  [place], [place]  [address]       Ph: [phone]       Fax: [phone]   RxID:   [id number] COREG 12.5 MG  TABS (CARVEDILOL) 1 1/2 tabs   two times a day  #90 x 3   Entered by:   Hardin Negus, RMA   Authorized by:   Talitha Givens, MD, [facility]   Signed by:   Hardin Negus, RMA on 08/12/2009   Method used:   Electronically to        [facility] (retail)       [address] [place], [place]  [address]       Ph: [phone]       Fax: [phone]   RxID:   [id number] LIPITOR 10 MG  TABS (ATORVASTATIN CALCIUM) 1 by mouth once daily  #90 x 3   Entered by:   Hardin Negus, RMA   Authorized by:   Talitha Givens, MD, [facility]   Signed by:   Hardin Negus, RMA on 08/12/2009   Method used:   Faxed to ...       [facility] (mail-order)       [address]       [place], [place]  [id number]       Ph: [phone]       Fax: [phone]   RxID:   [id number] COZAAR 50 MG  TABS (LOSARTAN POTASSIUM) 1 by mouth  once daily  #90 x 3   Entered by:   Hardin Negus, RMA   Authorized by:   Talitha Givens, MD, [facility]   Signed by:   Hardin Negus, RMA on 08/12/2009   Method used:   Faxed to ...       [facility] (mail-order)       [address]       [place], [place]  [id number]       Ph: [phone]       Fax: [phone]   RxID:   [id number] POTASSIUM CHLORIDE CRYS CR 20 MEQ TBCR (POTASSIUM CHLORIDE CRYS CR) 3 tablets in the AM  2 tablets in the PM  #450 x 3   Entered by:   Hardin Negus, RMA   Authorized by:   Talitha Givens, MD, [facility]   Signed by:   Hardin Negus, RMA on 08/12/2009   Method used:   Faxed to ...       [facility] (mail-order)       [address]       [place], [place]  [id number]       Ph: [phone]       Fax: [phone]   RxID:   [id number] ENALAPRIL MALEATE 20 MG TABS (ENALAPRIL MALEATE) Take 1 tablet by mouth twice a day  #180 x 3   Entered by:   Hardin Negus, RMA   Authorized by:   Talitha Givens, MD, [facility]   Signed by:   Hardin Negus, RMA on 08/12/2009   Method used:   Faxed to ...       [facility] (mail-order)       [address]       [place], [place]  [id number]       Ph: [phone]       Fax: [phone]   RxID:   [id number]

## 2010-09-05 NOTE — Progress Notes (Signed)
Summary: wants lab results  Phone Note Call from Patient Call back at Home Phone [phone]   Caller: Patient Call For: [facility] Reason for Call: Lab or Test Results Summary of Call: NEED RESULTS FOR LAB TEST DONE 3 WKS AGO. PT STILL EXP HAVING DIARRHEA PLS CALL   Initial call taken by: Shan Levans,  April 18, 2007 11:48 AM  Follow-up for Phone Call        Check in E-chart- stool culture neg. Imodium AD didn't work. Diarrhea x 5 weeks now. Follow-up by: Romualdo Bolk, CMA,  April 18, 2007 12:22 PM  Additional Follow-up for Phone Call Additional follow up Details #1::        [facility] C Diff and Giardia. Pt aware will come by and get stuff to do test. Additional Follow-up by: Romualdo Bolk, CMA,  April 18, 2007 2:15 PM

## 2010-09-05 NOTE — Letter (Signed)
Summary: [facility]  [facility]   Imported By: Maryln Gottron 02/03/2010 12:35:08  _____________________________________________________________________  External Attachment:    Type:   Image     Comment:   External Document

## 2010-09-05 NOTE — Assessment & Plan Note (Signed)
Summary: f59m  Medications Added DORYX 75 MG  TBEC (DOXYCYCLINE HYCLATE) 3 times per week COREG 12.5 MG  TABS (CARVEDILOL) 1 1/2 tabs   two times a day MOBIC 15 MG TABS (MELOXICAM) Take one tablet by mouth once daily.      Allergies Added:   Visit Type:  Follow-up Primary Provider:  Madelin Headings MD  CC:  followup hypertension and hyperlipidemia.  History of Present Illness: The patient is doing very well. Her blood pressure is under good control. Her lipids were checked yesterday and her axilla. We know from the past the patient has mitral valve prolapse. She does not have significant MR.  Current Medications (verified): 1)  Enalapril Maleate 20 Mg Tabs (Enalapril Maleate) .... Take 1 Tablet By Mouth Twice A Day 2)  Potassium Chloride Crys Cr 20 Meq Tbcr (Potassium Chloride Crys Cr) .... Take 5 Tablet By Mouth 3)  Doryx 75 Mg  Tbec (Doxycycline Hyclate) .... 3 Times Per Week 4)  Furosemide 20 Mg  Tabs (Furosemide) .Marland Kitchen.. 1 By Mouth Once Daily 5)  Coreg 12.5 Mg  Tabs (Carvedilol) .Marland Kitchen.. 1 1/2 Tabs   Two Times A Day 6)  Cozaar 50 Mg  Tabs (Losartan Potassium) .Marland Kitchen.. 1 By Mouth  Once Daily 7)  Norvasc 5 Mg  Tabs (Amlodipine Besylate) .Marland Kitchen.. 1 By Mouth Once Daily 8)  Lipitor 10 Mg  Tabs (Atorvastatin Calcium) .Marland Kitchen.. 1 By Mouth Once Daily 9)  Amitriptyline Hcl 25 Mg  Tabs (Amitriptyline Hcl) .Marland Kitchen.. 1-2 By Mouth Once Daily 10)  Baby Aspirin 81 Mg  Chew (Aspirin) 11)  Caltrate 600+d 600-400 Mg-Unit  Tabs (Calcium Carbonate-Vitamin D) 12)  Glucosamine-Chondroitin 750-600 Mg  Tabs (Glucosamine-Chondroitin) 13)  Biotin Forte 5 Mg  Tabs (Biotin) 14)  Evening Primrose   Oil (Evening Primrose) 15)  Mobic 15 Mg Tabs (Meloxicam) .... Take One Tablet By Mouth Once Daily.  Allergies (verified): 1)  ! Codeine  Past History:  Past Medical History: Last updated: 02/02/2009  Osteoporosis Hyperlipidemia polymyalgia rheumatica  hypertension  mitral valve prolapse.  recent echocardiogram... prolapse,   but no significant MR  moderate tricuspid regurgitation.  Right ventricular     pressure in the range of 30 mmHg.  Normal left ventricular function.I  Prednisone therapy for her polymyalgia rheumatica.  High potassium requirement over time.  Syncope in August 2008 due to dehydration. Marland KitchenHeadache with findings of a left subdural.  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 Diarrhea in August   resolved. 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   Review of Systems       patient has no significant complaints today. She has no fevers chills or skin rashes. Patient denies headache, change in vision, change in hearing, cough, chest pain, shortness of breath, GI symptoms, GU symptoms. All other systems are reviewed and are negative.  Vital Signs:  Patient profile:   70 year old female Height:      65 inches Weight:      165 pounds BMI:     27.56 Pulse rate:   59 / minute BP sitting:   106 / 74  (left arm)  Vitals Entered By: Laurance Flatten CMA (February 03, 2009 10:25 AM)  Physical Exam  General:  patient looks quite good today. Eyes:  There is no xanthelasma. Neck:  there is no carotid bruit. Lungs:  lungs are clear.  Respiratory effort is nonlabored. Heart:  cardiac exam reveals S1-S2. No clicks or significant murmurs. Abdomen:  The abdomen is soft. Msk:  There is no peripheral edema Psych:  The patient is oriented to person time and place. Affect is normal.   Impression & Recommendations:  Problem # 1:  SYNCOPE (ICD-780.2)  The following medications were removed from the medication list:    Prednisone 5 Mg Tabs (Prednisone) .Marland Kitchen... 7.5 mg once daily    Toprol Xl 100 Mg Tb24 (Metoprolol succinate) .Marland Kitchen... Take 1/2 tablet by mouth once a day Her updated medication list for this problem includes:    Enalapril Maleate 20 Mg Tabs (Enalapril maleate) .Marland Kitchen... Take 1 tablet by mouth  twice a day    Coreg 12.5 Mg Tabs (Carvedilol) .Marland Kitchen... 1 1/2 tabs   two times a day    Norvasc 5 Mg Tabs (Amlodipine besylate) .Marland Kitchen... 1 by mouth once daily    Baby Aspirin 81 Mg Chew (Aspirin) The patient has had no signs of recurrent syncope.  Problem # 2:  MITRAL VALVE PROLAPSE (ICD-424.0)  The following medications were removed from the medication list:    Toprol Xl 100 Mg Tb24 (Metoprolol succinate) .Marland Kitchen... Take 1/2 tablet by mouth once a day Her updated medication list for this problem includes:    Enalapril Maleate 20 Mg Tabs (Enalapril maleate) .Marland Kitchen... Take 1 tablet by mouth twice a day    Furosemide 20 Mg Tabs (Furosemide) .Marland Kitchen... 1 by mouth once daily    Coreg 12.5 Mg Tabs (Carvedilol) .Marland Kitchen... 1 1/2 tabs   two times a day    Cozaar 50 Mg Tabs (Losartan potassium) .Marland Kitchen... 1 by mouth  once daily Historically there is mitral valve prolapse but no significant MR. No testing is needed now.  Problem # 3:  HYPERTENSION (ICD-401.9)  The following medications were removed from the medication list:    Toprol Xl 100 Mg Tb24 (Metoprolol succinate) .Marland Kitchen... Take 1/2 tablet by mouth once a day Her updated medication list for this problem includes:    Enalapril Maleate 20 Mg Tabs (Enalapril maleate) .Marland Kitchen... Take 1 tablet by mouth twice a day    Furosemide 20 Mg Tabs (Furosemide) .Marland Kitchen... 1 by mouth once daily    Coreg 12.5 Mg Tabs (Carvedilol) .Marland Kitchen... 1 1/2 tabs   two times a day    Cozaar 50 Mg Tabs (Losartan potassium) .Marland Kitchen... 1 by mouth  once daily    Norvasc 5 Mg Tabs (Amlodipine besylate) .Marland Kitchen... 1 by mouth once daily    Baby Aspirin 81 Mg Chew (Aspirin) Hypertension is nicely controlled. No need to change any meds. We'll see the patient back in 6 months for followup. You can send  Orders: EKG w/ Interpretation (93000)  Patient Instructions: 1)  Follow up in 6 months

## 2010-09-05 NOTE — Progress Notes (Signed)
Summary: Question about symptoms  Phone Note Call from Patient Call back at Home Phone [phone]   Summary of Call: Spoke with nurse on wednesday about having diarrhea x 1 wk. Not better wants a call Initial call taken by: Barnie Mort,  March 21, 2007 10:18 AM  Follow-up for Phone Call        Call to patient.  Says she has been on clear liquids since Wed.  No better.  Calling as instructed.  OV dr Tawanna Cooler 3:15 Follow-up by: Rudy Jew, RN,  March 21, 2007 11:35 AM

## 2010-09-05 NOTE — Assessment & Plan Note (Signed)
Summary: FLU SHOT/NJR  Nurse Visit   Allergies: 1)  ! Codeine  Review of Systems       Flu Vaccine Consent Questions     Do you have a history of severe allergic reactions to this vaccine? no    Any prior history of allergic reactions to egg and/or gelatin? no    Do you have a sensitivity to the preservative Thimersol? no    Do you have a past history of Guillan-Barre Syndrome? no    Do you currently have an acute febrile illness? no    Have you ever had a severe reaction to latex? no    Vaccine information given and explained to patient? yes    Are you currently pregnant? no    Lot Number:[id number]   Exp Date:02/03/2011   Site Given  Left Deltoid IM    Orders Added: 1)  Flu Vaccine 10yrs + MEDICARE PATIENTS [Q2039] 2)  Administration Flu vaccine - MCR [G0008]

## 2010-09-06 NOTE — Op Note (Signed)
[name], [name]            ACCOUNT NO.:  [id number]  MEDICAL RECORD NO.:  [id number]          PATIENT TYPE:  INP  LOCATION:  0007                         FACILITY:  [facility]  PHYSICIAN:  Ollen Gross, M.D.    DATE OF BIRTH:  14-Jul-1941  DATE OF PROCEDURE:  08/30/2010 DATE OF DISCHARGE:                              OPERATIVE REPORT   PREOPERATIVE DIAGNOSIS:  Osteoarthritis left knee.  POSTOPERATIVE DIAGNOSIS:  Osteoarthritis left knee.  PROCEDURE PERFORMED:  Left total knee arthroplasty.  SURGEON:  Ollen Gross, MD  ASSISTANT:  Patrica Duel PA-C  ANESTHESIA:  General endotracheal.  ESTIMATED BLOOD LOSS:  Minimal.  DRAINS:  Hemovac x1.  TOURNIQUET TIME:  35 minutes at 300 mmHg.  COMPLICATIONS:  None.  CONDITION:  Stable to recovery room.  INDICATIONS FOR PROCEDURE:  [name] is a 70 year old female with advanced end-stage arthritis, left knee, with progressively worsening pain and dysfunction.  She has failed nonoperative management and presents for left total knee arthroplasty.  PROCEDURE IN DETAIL:  After successful administration of general anesthetic, a tourniquet was placed on the left thigh and left lower extremity prepped and draped in the usual sterile fashion.  Extremity was wrapped in Esmarch, knee flexed, tourniquet inflated to 300 mmHg. Midline incision was made with a 10-blade through subcutaneous tissue to the level of the extensor mechanism.  A fresh blade was used to make a medial parapatellar arthrotomy.  Soft tissue on the proximal medial tibia subperiosteally elevated to the joint line with the knife into the semimembranosus bursa with a Cobb elevator.  Soft tissue laterally was elevated with attention being paid to avoiding the patellar tendon on the tibial tubercle.  The patella was everted, knee flexed 90 degrees, and ACL and PCL removed.  Drill was used to create a starting hole in the distal femur.  The canal was thoroughly  irrigated.  The 5-degree left valgus alignment guide was placed and referencing of the posterior condyles rotation was marked to remove 10 mm off the distal femur. Distal femoral resection was made with an oscillating saw.  The tibia was subluxed forward and the menisci were removed. Extramedullary tibial alignment guide was placed referencing proximally at the medial aspect of the tibial tubercle and distally along the second metatarsal axis and the tibial crest.  The block was pinned to remove 2 mm off the more deficient medial side.  Tibial resection is made with an oscillating saw.  Size 3 is the most appropriate tibial component.  The proximal tibia was prepared with a modular drill and keel punch for the size 3.  Femoral sizing guide was placed, size 4 was the most appropriate. Rotation was marked at the epicondylar axis and confirmed by creating rectangular flexion gap at 90 degrees.  Block was pinned in this rotation and the anterior-posterior chamfer cuts were made. Intercondylar block was placed.  The trial size 4 narrow posterior stabilized femoral component was placed.  A 12.5-mm posterior stabilized rotating platform insert trial was placed.  The 12.5 has a tiny bit of varus-valgus, replaced it with a 15 throughout with full extension with excellent varus-valgus and anterior-posterior balance throughout full  range of motion.  Patella was everted, this measured 22 mm.  Freehand resection taken to 13 mm, 35 template was placed, lug holes were drilled, trial patella was placed and it tracks normally.  The osteophytes are then removed off the posterior femur with the trial in place.  All trials were removed and the cut bone surfaces were prepared with pulsatile lavage.  Cement was mixed and once ready for implantation, the size 3 mobile bearing tibial tray, size 4 narrow posterior stabilized femur and 35 patella were cemented into place and the patella was held with a clamp.   Trial 15-mm insert was placed, knee held in full extension, all extruded cement removed.  When the cement was fully hardened then the permanent 15 mm posterior stabilized rotating platform insert was placed in the tibial tray.  Wound was copiously irrigated with saline solution and the arthrotomy closed over the Hemovac drain with interrupted #1 PDS.  Flexion against gravity to 135 degrees.  Patella tracks normally.  The tourniquet was released for a total tourniquet time of 35 minutes.  Subcu was placed with interrupted 2-0 Vicryl and subcuticular running 4-0 Monocryl.  Drain was hooked to suction.  Catheter for the Marcaine pain pump was placed and the pump was initiated.  Incisions cleaned and dried and Steri-Strips with bulky sterile dressing applied.  She was then placed into a knee immobilizer, awakened and transported to recovery in stable condition.     Ollen Gross, M.D.     FA/MEDQ  D:  08/30/2010  T:  08/30/2010  Job:  [id number]  Electronically Signed by Ollen Gross M.D. on 09/06/2010 03:38:19 PM

## 2010-09-07 NOTE — Assessment & Plan Note (Signed)
Summary: f40m   Visit Type:  surgical clearance Referring Provider:  Ollen Gross, MD Primary Provider:  Madelin Headings MD  CC:  mitral valve prolapse.  History of Present Illness: The patient is seen today for surgical clearance for knee surgery and for followup of her mitral valve prolapse.  Other than her knee problem she is doing well.  I saw her last July, 2011.  She's not having any chest pain or shortness of breath.  Current Medications (verified): 1)  Enalapril Maleate 20 Mg Tabs (Enalapril Maleate) .... Take 1 Tablet By Mouth Twice A Day 2)  Potassium Chloride Crys Cr 20 Meq Tbcr (Potassium Chloride Crys Cr) .... 3 Tablets in The Am  2 Tablets in The Pm 3)  Doryx 75 Mg  Tbec (Doxycycline Hyclate) .... 3 Times Per Week 4)  Furosemide 20 Mg  Tabs (Furosemide) .Marland Kitchen.. 1 By Mouth Once Daily 5)  Coreg 12.5 Mg  Tabs (Carvedilol) .Marland Kitchen.. 1 1/2 Tabs   Two Times A Day 6)  Cozaar 50 Mg  Tabs (Losartan Potassium) .Marland Kitchen.. 1 By Mouth  Once Daily 7)  Norvasc 5 Mg  Tabs (Amlodipine Besylate) .Marland Kitchen.. 1 By Mouth Once Daily 8)  Lipitor 10 Mg  Tabs (Atorvastatin Calcium) .Marland Kitchen.. 1 By Mouth Once Daily 9)  Amitriptyline Hcl 25 Mg  Tabs (Amitriptyline Hcl) .Marland Kitchen.. 1-2 By Mouth Once Daily 10)  Baby Aspirin 81 Mg  Chew (Aspirin) 11)  Caltrate 600+d 600-400 Mg-Unit  Tabs (Calcium Carbonate-Vitamin D) 12)  Glucosamine-Chondroitin 750-600 Mg  Tabs (Glucosamine-Chondroitin) 13)  Biotin Forte 5 Mg  Tabs (Biotin) 14)  Evening Primrose   Oil (Evening Primrose) 15)  Mobic 15 Mg Tabs (Meloxicam) .... Take One Tablet By Mouth Once Daily. 16)  Reclast 5 Mg/171ml Soln (Zoledronic Acid) .... Yearly 17)  Vitmain A 25000iu .... Once Daily For 3 Mths Then Off For 1 Mth and Repeat  Allergies (verified): 1)  ! Codeine  Past History:  Past Medical History:  Osteoporosis Hyperlipidemia polymyalgia rheumatica  hypertension  mitral valve prolapse. ......  Echo.... August 2008.... mild mitral regurgitation /  moderate prolapse  posterior leaflet... trivial MR.... echo.... January, 2011  tricuspid regurgitation   mild to moderate.... echo... January, 2011 Right ventricular  pressure in the range of 30 mmHg. 2008  /   PA pressure... 38 mmHg.... January, 2011 EF 65%... echo.... August 24, 2009   Prednisone therapy for her polymyalgia rheumatica. (stopped)  High potassium requirement over time.  Syncope in August 2008 due to dehydration. Marland KitchenHeadache with findings of a left subdural.  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 Diarrhea in August   resolved. 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  Skin cancer  Clearance for left knee surgery   .. January, 2012  Review of Systems       Patient denies fever, chills, headache, sweats, rash, change in vision, change in hearing, chest pain, cough, nausea vomiting, urinary symptoms.  All other systems are reviewed and are negative.  Vital Signs:  Patient profile:   70 year old female Menstrual status:  postmenopausal Height:      65 inches Weight:      163 pounds BMI:     27.22 Pulse rate:   52 / minute BP sitting:   134 / 76  (left arm) Cuff size:   regular  Vitals Entered By: Hardin Negus, RMA (  August 22, 2010 11:30 AM)  Physical Exam  General:  The patient is quite stable. Head:  head is atraumatic. Eyes:  no xanthelasma. Neck:  no jugular venous distention.  No carotid bruits. Chest Wall:  no chest wall tenderness. Lungs:  lungs are clear.  Respiratory effort is nonlabored. Heart:  cardiac exam reveals S1-S2.  I do not hear a click or murmur today. Abdomen:  abdomen is soft. Msk:  no musculoskeletal deformities. Extremities:  no peripheral edema. Skin:  no skin rashes. Psych:  patient is oriented to person time and place.  Affect is normal.   Impression & Recommendations:  Problem # 1:  * CLEARANCE FOR KNEE SURGERY.Jake Shark,  2012 The patient's overall cardiac status is stable.  She does not need any further cardiac workup.  EKG is done today and reviewed by me.  She has sinus bradycardia.  There no significant EKG abnormalities.  I will suggest that her carvedilol be held on the morning of her surgery. The patient is cleared for surgery. I will request that a chest x-ray be done with her preoperative labs if this has not already arranged.  Problem # 2:  MITRAL VALVE PROLAPSE (ICD-424.0) The patient does have mitral valve prolapse.  She has only trivial mitral regurgitation by history.  Her last echo was done in January, 2011.  She has mild-to-moderate tricuspid regurgitation but no evidence of significant pulmonary hypertension.  She does not need a followup echo at this time.  Problem # 3:  HYPERTENSION (ICD-401.9)  Her updated medication list for this problem includes:    Enalapril Maleate 20 Mg Tabs (Enalapril maleate) .Marland Kitchen... Take 1 tablet by mouth twice a day    Furosemide 20 Mg Tabs (Furosemide) .Marland Kitchen... 1 by mouth once daily    Coreg 12.5 Mg Tabs (Carvedilol) .Marland Kitchen... 1 1/2 tabs   two times a day    Cozaar 50 Mg Tabs (Losartan potassium) .Marland Kitchen... 1 by mouth  once daily    Norvasc 5 Mg Tabs (Amlodipine besylate) .Marland Kitchen... 1 by mouth once daily    Baby Aspirin 81 Mg Chew (Aspirin) Blood pressure is well controlled.  Her surgery is to take place in the afternoon on August 30, 2010.  I've suggested that she hold her Lasix and her carvedilol the morning of the surgery.   Problem # 4:  HYPERLIPIDEMIA (ICD-272.4)  Her updated medication list for this problem includes:    Lipitor 10 Mg Tabs (Atorvastatin calcium) .Marland Kitchen... 1 by mouth once daily We will arrange for fasting lipid profile when it is convenient for the patient.  Other Orders: EKG w/ Interpretation (93000) T-2 View CXR (71020TC)  Patient Instructions: 1)  A chest x-ray takes a picture of the organs and structures inside the chest, including the heart, lungs, and  blood vessels. This test can show several things, including, whether the heart is enlarged; whether fluid is building up in the lungs; and whether pacemaker / defibrillator leads are still in place.  Tom. at [facility] office. 2)  Your physician recommends that you return for a FASTING lipid profile: tom. 1/18 at [facility] office. 3)  The AM of your surgery DO NOT Take Lasix or Carvedilol 4)  Your physician wants you to follow-up in:   6 months. You will receive a reminder letter in the mail two months in advance. If you don't receive a letter, please call our office to schedule the follow-up appointment.

## 2010-09-07 NOTE — Letter (Signed)
Summary: Request for Surgical Clearance/[facility]  Request for Surgical Clearance/[facility]   Imported By: Maryln Gottron 08/31/2010 12:57:42  _____________________________________________________________________  External Attachment:    Type:   Image     Comment:   External Document

## 2010-09-07 NOTE — Assessment & Plan Note (Signed)
Summary: pre-surgerical clearence/ssc   Vital Signs:  Patient profile:   70 year old female Menstrual status:  postmenopausal Height:      65 inches Weight:      163 pounds Pulse rate:   66 / minute BP sitting:   100 / 60  (left arm) Cuff size:   regular  Vitals Entered By: Romualdo Bolk, CMA (AAMA) (August 23, 2010 3:08 PM) CC: Pre-Sur. Clearence   History of Present Illness: [name] comes in today  for pre operative evlauation for  left tka per Dr Despina Hick to be done next week..  Since last visit   no major changes in health.  Has seen Dr Myrtis Ser this am and  CV status is stable. Had lipids done  asks about her potassium level. No change in meds. NO cp sob bleeding recent infection .  bothered by post nasal drainage but no sleep apnea .   Knees have been problematic for  years  now ready for tkr.     Preventive Screening-Counseling & Management  Alcohol-Tobacco     Alcohol drinks/day: 0     Smoking Status: never  Caffeine-Diet-Exercise     Caffeine use/day: 2     Does Patient Exercise: yes     Type of exercise: treadmill and bike     Times/week: 5  Current Medications (verified): 1)  Enalapril Maleate 20 Mg Tabs (Enalapril Maleate) .... Take 1 Tablet By Mouth Twice A Day 2)  Potassium Chloride Crys Cr 20 Meq Tbcr (Potassium Chloride Crys Cr) .... 3 Tablets in The Am  2 Tablets in The Pm 3)  Doryx 75 Mg  Tbec (Doxycycline Hyclate) .... 3 Times Per Week 4)  Furosemide 20 Mg  Tabs (Furosemide) .Marland Kitchen.. 1 By Mouth Once Daily 5)  Coreg 12.5 Mg  Tabs (Carvedilol) .Marland Kitchen.. 1 1/2 Tabs   Two Times A Day 6)  Cozaar 50 Mg  Tabs (Losartan Potassium) .Marland Kitchen.. 1 By Mouth  Once Daily 7)  Norvasc 5 Mg  Tabs (Amlodipine Besylate) .Marland Kitchen.. 1 By Mouth Once Daily 8)  Lipitor 10 Mg  Tabs (Atorvastatin Calcium) .Marland Kitchen.. 1 By Mouth Once Daily 9)  Amitriptyline Hcl 25 Mg  Tabs (Amitriptyline Hcl) .Marland Kitchen.. 1-2 By Mouth Once Daily 10)  Baby Aspirin 81 Mg  Chew (Aspirin) 11)  Caltrate 600+d 600-400 Mg-Unit   Tabs (Calcium Carbonate-Vitamin D) 12)  Glucosamine-Chondroitin 750-600 Mg  Tabs (Glucosamine-Chondroitin) 13)  Biotin Forte 5 Mg  Tabs (Biotin) 14)  Evening Primrose   Oil (Evening Primrose) 15)  Mobic 15 Mg Tabs (Meloxicam) .... Take One Tablet By Mouth Once Daily. 16)  Reclast 5 Mg/19ml Soln (Zoledronic Acid) .... Yearly 17)  Vitmain A 25000iu .... Once Daily For 3 Mths Then Off For 1 Mth and Repeat  Allergies (verified): 1)  ! Codeine  Past History:  Past medical, surgical, family and social histories (including risk factors) reviewed for relevance to current acute and chronic problems.  Past Medical History: Reviewed history from 08/22/2010 and no changes required.  Osteoporosis Hyperlipidemia polymyalgia rheumatica  hypertension  mitral valve prolapse. ......  Echo.... August 2008.... mild mitral regurgitation /  moderate prolapse posterior leaflet... trivial MR.... echo.... January, 2011  tricuspid regurgitation   mild to moderate.... echo... January, 2011 Right ventricular  pressure in the range of 30 mmHg. 2008  /   PA pressure... 38 mmHg.... January, 2011 EF 65%... echo.... August 24, 2009   Prednisone therapy for her polymyalgia rheumatica. (stopped)  High potassium requirement over time.  Syncope in August 2008 due to dehydration. Marland KitchenHeadache with findings of a left subdural.  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 Diarrhea in August   resolved. 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  Skin cancer  Clearance for left knee surgery   .. January, 2012  Past Surgical History: Reviewed history from 02/24/2010 and no changes required. Hemorrhoidectomy Mohs surgery 2011  Past History:  Care Management: Cardiology: Myrtis Ser   Gastroenterology: Leone Payor Gynecology: Dianne Dun Rheumatology: Mallie Mussel(  Mamie Nick. )  Dermatology:[facility]rich Judie Bonus  Family History: Reviewed history from 07/09/2008 and no changes required. family history of coronary artery disease  Social History: Reviewed history from 07/09/2008 and no changes required. Married Never Smoked Retired  Review of Systems  The patient denies anorexia, fever, weight loss, weight gain, vision loss, decreased hearing, hoarseness, chest pain, syncope, dyspnea on exertion, peripheral edema, prolonged cough, headaches, melena, hematochezia, severe indigestion/heartburn, hematuria, transient blindness, depression, abnormal bleeding, enlarged lymph nodes, and angioedema.         problem with chronic pnd  more recently no fever or  face pain  no asthma ? allergy.  has MVP  elevated lipids and ht .  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  Physical Exam  General:  Well-developed,well-nourished,in no acute distress; alert,appropriate and cooperative throughout examination Head:  normocephalic and atraumatic.   Eyes:  vision grossly intact.   Ears:  R ear normal and L ear normal.   Nose:  no external deformity and no nasal discharge.   mildly congested Mouth:  pharynx pink and moist.   Neck:  No deformities, masses, or tenderness noted. Lungs:  Normal respiratory effort, chest expands symmetrically. Lungs are clear to auscultation, no crackles or wheezes. Heart:  cardiac exam reveals S1 and S2.  There is a soft systolic murmur.no lifts.   Abdomen:  Bowel sounds positive,abdomen soft and non-tender without masses, organomegaly or   noted. Msk:  no joint swelling, no joint warmth, and no redness over joints.   Pulses:  pulses intact without delay   Extremities:  no clubbing cyanosis or edema  Neurologic:  alert & oriented X3, strength normal in all extremities, and gait normal.   Skin:  turgor normal, color normal, no ecchymoses, and no petechiae.   Cervical Nodes:  No lymphadenopathy noted Psych:  Oriented X3, normally interactive, good eye contact, and not anxious appearing.      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   EKG  per cards is WNL  sinus brady  Problem # 3:  RHINITIS (ICD-472.0) trial of oomnaris or other steroid ns   if helpful can continue   .. revewed use of nose spray etc. samples given  Problem # 4:  HYPERTENSION (ICD-401.9)  Her updated medication list for this problem includes:    Enalapril Maleate 20 Mg Tabs (Enalapril maleate) .Marland Kitchen... Take 1 tablet by mouth twice a day    Furosemide 20 Mg Tabs (Furosemide) .Marland Kitchen... 1 by mouth once daily    Coreg 12.5 Mg Tabs (Carvedilol) .Marland Kitchen... 1 1/2 tabs   two times a day    Cozaar 50 Mg Tabs (Losartan potassium) .Marland Kitchen... 1 by mouth  once daily    Norvasc 5 Mg Tabs (Amlodipine besylate) .Marland Kitchen... 1 by mouth once  daily  BP today: 100/60 Prior BP: 134/76 (08/22/2010)  Labs Reviewed: K+: 4.2 (08/26/2009) Creat: : 0.6 (08/26/2009)   Chol: 160 (08/26/2009)   HDL: 75.90 (08/26/2009)   LDL: 71 (08/26/2009)   TG: 65.0 (08/26/2009)  Problem # 5:  HYPERLIPIDEMIA (ICD-272.4) reviewed  her labs  via dr Myrtis Ser Her updated medication list for this problem includes:    Lipitor 10 Mg Tabs (Atorvastatin calcium) .Marland Kitchen... 1 by mouth once daily  Labs Reviewed: SGOT: 22 (08/26/2009)   SGPT: 20 (08/26/2009)   HDL:75.90 (08/26/2009), 68.50 (02/02/2009)  LDL:71 (08/26/2009), 72 (02/02/2009)  Chol:160 (08/26/2009), 152 (02/02/2009)  Trig:65.0 (08/26/2009), 59.0 (02/02/2009)  Complete Medication List: 1)  Enalapril Maleate 20 Mg Tabs (Enalapril maleate) .... Take 1 tablet by mouth twice a day 2)  Potassium Chloride Crys Cr 20 Meq Tbcr (Potassium chloride crys cr) .... 3 tablets in the am  2 tablets in the pm 3)  Doryx 75 Mg Tbec (Doxycycline hyclate) .... 3 times per week 4)  Furosemide 20 Mg Tabs (Furosemide) .Marland Kitchen.. 1 by mouth once daily 5)  Coreg 12.5 Mg Tabs (Carvedilol) .Marland Kitchen.. 1 1/2 tabs   two times a day 6)  Cozaar 50 Mg  Tabs (Losartan potassium) .Marland Kitchen.. 1 by mouth  once daily 7)  Norvasc 5 Mg Tabs (Amlodipine besylate) .Marland Kitchen.. 1 by mouth once daily 8)  Lipitor 10 Mg Tabs (Atorvastatin calcium) .Marland Kitchen.. 1 by mouth once daily 9)  Amitriptyline Hcl 25 Mg Tabs (Amitriptyline hcl) .Marland Kitchen.. 1-2 by mouth once daily 10)  Baby Aspirin 81 Mg Chew (Aspirin) 11)  Caltrate 600+d 600-400 Mg-unit Tabs (Calcium carbonate-vitamin d) 12)  Glucosamine-chondroitin 750-600 Mg Tabs (Glucosamine-chondroitin) 13)  Biotin Forte 5 Mg Tabs (Biotin) 14)  Evening Primrose Oil (Evening primrose) 15)  Mobic 15 Mg Tabs (Meloxicam) .... Take one tablet by mouth once daily. 16)  Reclast 5 Mg/146ml Soln (Zoledronic acid) .... Yearly 17)  Vitmain A 25000iu  .... Once daily for 3 mths then off for 1 mth and repeat 18)  Omnaris 50 Mcg/act Susp (Ciclesonide) .... 2 spray each nostril q d  Patient Instructions: 1)  try nasal steroid nose spray for 10-14 days to see  if helps   post nasal drainage and congestion.  2)  if helpful we can give you a prescription for this. 3)  Will fax   form to orthopedist .  called lab to see if can add on bmp  to include potassium  and renal function.  Orders Added: 1)  Est. Patient Level IV [01093]

## 2010-09-20 NOTE — H&P (Signed)
NAMEAMICA, [name]            ACCOUNT NO.:  [id number]  MEDICAL RECORD NO.:  [id number]          PATIENT TYPE:  INP  LOCATION:  1528                         FACILITY[facility]  PHYSICIAN:  Ollen Gross, M.D.    DATE OF BIRTH:  1941/04/03  DATE OF ADMISSION:  08/30/2010 DATE OF DISCHARGE:  09/04/2010                             HISTORY & PHYSICAL   CHIEF COMPLAINT:  Left knee pain.  HISTORY OF PRESENT ILLNESS:  The patient is a 70 year old female who has been seen by Dr. Lequita Halt for ongoing problems with her left knee.  She has bilateral knee pain, left is more symptomatic and problematic than the right.  The x-rays show in the office end-stage arthritis.  She has reached a point where she would like to have something done about it at this time.  Risks and benefits have been discussed.  She elects to proceed with surgery.  ALLERGIES:  No known drug allergies.  CURRENT MEDICATIONS: 1. Enalapril. 2. Potassium chloride. 3. Doxycycline. 4. Furosemide. 5. Coreg. 6. Cozaar. 7. Norvasc. 8. Lipitor. 9. Amitriptyline. 10.Baby aspirin. 11.Caltrate plus D. 12.Glucosamine chondroitin. 13.Biotin. 14.Primrose oil. 15.Mobic. 16.Reclast. 17.Vitamin A.  PAST MEDICAL HISTORY: 1. Hypertension. 2. Mitral valve prolapse. 3. Degenerative disk disease. 4. Postmenopausal. 5. Osteopenia.  PAST SURGICAL HISTORY:  Hemorrhoid surgery in 2001.  FAMILY HISTORY:  Father with heart disease.  Mother with heart disease.  SOCIAL HISTORY:  Married, homemaker, nonsmoker.  No alcohol, 3 children. Husband will be assisting with care after surgery.  She has 1 step entering her home.  REVIEW OF SYSTEMS:  GENERAL:  No fevers, chills, or night sweats. NEURO:  No seizures, syncope, or paralysis.  RESPIRATORY:  No shortness breath, productive cough, or hemoptysis.  CARDIOVASCULAR:  No chest pains or orthopnea.  GI:  No nausea, vomiting, diarrhea, or constipation.  GU:  No dysuria, hematuria, or  discharge. MUSCULOSKELETAL:  Left knee.  PHYSICAL EXAMINATION:  VITAL SIGNS:  Pulse 68, respirations 12, and blood pressure 138/78. GENERAL:  A 70 year old white female, well nourished, well developed, in no acute distress.  She is alert, oriented, cooperative, and pleasant. Good historian. HEENT:  Normocephalic, atraumatic.  Pupils are round and reactive.  EOMs intact.  Noted wears glasses. NECK:  Supple.  No carotid bruits. CHEST:  Clear anterior posterior chest walls.  No rhonchi, rales, or wheezing. HEART:  Regular rhythm.  No murmur, S1-S2 noted. ABDOMEN:  Soft and nontender.  Bowel sounds present. RECTAL, BREASTS, GENITALIA:  Not done and pertinent to present illness. EXTREMITIES:  Left knee, no effusion, marked crepitus.  Range of motion 5-125.  No instability.  IMPRESSION:  Osteoarthritis, left knee.  PLAN:  The patient admitted to [facility] to undergo a left total knee replacement arthroplasty.  Surgery will be performed by Dr. Ollen Gross.     [name], P.A.C.  ______________________________ Ollen Gross, M.D.    ALP/MEDQ  D:  09/13/2010  T:  09/13/2010  Job:  [id number]  cc:   Neta Mends. Fabian Sharp, MD [address] [place], [place] [address]  Dr. Myrtis Ser  Electronically Signed by Marlowe Sax PERKINS P.A.C. on 09/18/2010 08:24:51 AM Electronically  Signed by Ollen Gross M.D. on 09/20/2010 02:52:55 PM

## 2010-09-27 NOTE — Discharge Summary (Signed)
[name], [name]            ACCOUNT NO.:  [id number]  MEDICAL RECORD NO.:  [id number]          PATIENT TYPE:  INP  LOCATION:  1528                         FACILITY:  [facility]  PHYSICIAN:  Ollen Gross, M.D.    DATE OF BIRTH:  Jan 31, 1941  DATE OF ADMISSION:  08/30/2010 DATE OF DISCHARGE:  09/04/2010                              DISCHARGE SUMMARY   ADMITTING DIAGNOSES: 1. Osteoarthritis, left knee. 2. Hypertension. 3. Mitral valve prolapse. 4. Degenerative disk disease. 5. Postmenopausal. 6. Osteopenia.  DISCHARGE DIAGNOSES: 1. Osteoarthritis, left knee, status post left total knee replacement     arthroplasty. 2. Postop acute blood loss anemia. 3. Hypertension. 4. Mitral valve prolapse. 5. Degenerative disk disease. 6. Postmenopausal. 7. Osteopenia.  PROCEDURE:  August 30, 2010, left total knee.  SURGEON:  Ollen Gross, M.D.  ASSISTANT:  Alexzandrew L. Perkins, P.A.C.  ANESTHESIA:  General. TOURNIQUET TIME:  35 minutes.  CONSULTS:  None.  BRIEF HISTORY:  [name] is a 70 year old female with end-stage arthritis of left knee, progressive worsening pain and dysfunction, has failed nonoperative management, now presents for total knee arthroplasty.  LABORATORY DATA:  Preop CBC showed a hemoglobin of 14, hematocrit of 43.4, white cell count 6.3, platelets 242.  Postop hemoglobin 11.2, then 9.1 where stabilized.  Last noted hemoglobin 9.3 and hematocrit 28.9. PT/INR 14.4/1.10 with a PTT of 41.  Serial prothrombin times followed per Coumadin protocol.  Last noted PT/INR 20.9/1.78. Preop BMET was all within normal limits.  Serial BMETs were followed for 48 hours, glucose went up to 167, back down to 124, the electrolytes all remained within normal limits.  Preop UA negative.  Blood group type A positive.  Nasal swabs were negative.  Staph aureus negative for MRSA.  HOSPITAL COURSE:  The patient admitted to [facility], taken to OR, underwent the  above-stated procedure without complication.  The patient tolerated the procedure well, later transferred to recovery room and then to orthopedic floor, started on p.o. and IV analgesics for pain control following surgery, started back on her home meds.  She was initially placed on a PCA, which was discontinued postoperatively. Hemovac drain placed at the time of surgery was pulled.  She was doing fairly well on the morning of day #1, we switched over to oral medications.  Hemoglobin looked good.  She had decent urinary output. By day #2, she was starting to get up out of bed, working with therapy, we changed the dressing, incision looked good.  From this standpoint, she had an excellent urinary output, was actually negative on her fluids.  Hemoglobin was down a little bit to 9.1.  She was asymptomatic with this, put her on an iron supplement.  By day #3, she was doing well.  Hemoglobin was stable and she was ready to go home. Unfortunately, later on that morning, she got up and started to get light-headed, dizzy with change in position with her negative fluids. It was felt she was probably little dehydrated centrally, so we restarted an IV and got her some fluids back.  She did better after the fluid boluses.  She was seen on postop day #4  and while monitoring her pressure, pressure was still a little low.  We placed her home medications on parameters and due to orthostatic hypotension, the blood pressure meds were put on parameters and we continued to support her with fluid.  She was kept another day.  By the following day of September 04, 2010, her blood pressure was stable.  She was asymptomatic.  She was progressing well.  She had been rehydrated and wanted to go home.  DISCHARGE PLAN: 1. The patient was discharged to home on January 30, 12. 2. Discharge diagnoses, please see above. 3. Discharge meds:  Nu-Iron, Robaxin, Percocet, Coumadin, continue     amitriptyline, aspirin, Coreg,  Cozaar, doxycycline, enalapril,     furosemide, K-Dur, Lipitor, Norvasc, Omnaris, Refresh eye drops,     and Systane eye drops.  DIET:  Heart-healthy diet.  ACTIVITY:  She is weightbearing as tolerated, total knee protocol, home health PT, home nursing.  FOLLOWUP:  In 2 weeks.  DISPOSITION:  Home.  CONDITION UP ON DISCHARGE:  Improved.     Alexzandrew L. Julien Girt, P.A.C.   ______________________________ Ollen Gross, M.D.    ALP/MEDQ  D:  09/21/2010  T:  09/22/2010  Job:  [id number]  cc:   Neta Mends. Fabian Sharp, MD [address] [place], [place] [address]  Dr. Myrtis Ser  Electronically Signed by Patrica Duel P.A.C. on 09/26/2010 07:46:59 AM Electronically Signed by Ollen Gross M.D. on 09/27/2010 04:47:12 PM

## 2010-10-11 ENCOUNTER — Other Ambulatory Visit (INDEPENDENT_AMBULATORY_CARE_PROVIDER_SITE_OTHER): Payer: Medicare Other

## 2010-10-11 DIAGNOSIS — M810 Age-related osteoporosis without current pathological fracture: Secondary | ICD-10-CM

## 2010-10-25 ENCOUNTER — Ambulatory Visit (HOSPITAL_COMMUNITY): Payer: Medicare Other | Attending: Obstetrics and Gynecology

## 2010-10-25 ENCOUNTER — Telehealth: Payer: Self-pay | Admitting: Cardiology

## 2010-10-25 DIAGNOSIS — I10 Essential (primary) hypertension: Secondary | ICD-10-CM

## 2010-10-25 DIAGNOSIS — M810 Age-related osteoporosis without current pathological fracture: Secondary | ICD-10-CM | POA: Insufficient documentation

## 2010-10-25 MED ORDER — LOSARTAN POTASSIUM 50 MG PO TABS: 50.0000 mg | ORAL_TABLET | Freq: Every day | ORAL | Status: DC

## 2010-10-25 NOTE — Telephone Encounter (Signed)
Refilled med

## 2010-10-30 ENCOUNTER — Other Ambulatory Visit: Payer: Self-pay | Admitting: *Deleted

## 2010-10-30 NOTE — Telephone Encounter (Signed)
Pt liked the Omnaris but is afraid it is too expensive.  She tried generick Flonase and did not like it.  Would like it to be cheaper and not Flonase.

## 2010-10-30 NOTE — Telephone Encounter (Signed)
Per Dr. Fabian Sharp- Can do nasonex We need to know where pt wants rx called into. Left message to call back.

## 2010-10-31 MED ORDER — MOMETASONE FUROATE 50 MCG/ACT NA SUSP: 2.0000 | Freq: Every day | NASAL | Status: DC

## 2010-11-01 ENCOUNTER — Telehealth: Payer: Self-pay | Admitting: *Deleted

## 2010-11-01 MED ORDER — MOMETASONE FUROATE 50 MCG/ACT NA SUSP: 2.0000 | Freq: Every day | NASAL | Status: DC

## 2010-11-01 NOTE — Telephone Encounter (Signed)
Pt wanted rx called into [facility]. Rx was sent to [facility]. Pt aware of this and is going to call [facility] about this. But she still wants a rx called into [facility]. Rx sent to [facility].

## 2010-11-16 ENCOUNTER — Telehealth: Payer: Self-pay | Admitting: Cardiology

## 2010-11-17 MED ORDER — ENALAPRIL MALEATE 20 MG PO TABS: 20.0000 mg | ORAL_TABLET | Freq: Two times a day (BID) | ORAL | Status: DC

## 2010-11-17 NOTE — Telephone Encounter (Signed)
Script sent  

## 2010-11-20 ENCOUNTER — Telehealth: Payer: Self-pay | Admitting: Cardiology

## 2010-11-20 NOTE — Telephone Encounter (Signed)
Pt states she needs potassium to be called in to [facility]

## 2010-11-21 MED ORDER — POTASSIUM CHLORIDE CRYS ER 20 MEQ PO TBCR: EXTENDED_RELEASE_TABLET | ORAL | Status: DC

## 2010-12-19 NOTE — H&P (Signed)
NAMEROYELLE, HINCHMAN            ACCOUNT NO.:  [id number]   MEDICAL RECORD NO.:  [id number]          PATIENT TYPE:  INP   LOCATION:  0102                         FACILITY:  [facility]   PHYSICIAN:  Kela Millin, M.D.DATE OF BIRTH:  12-12-1940   DATE OF ADMISSION:  03/26/2007  DATE OF DISCHARGE:                              HISTORY & PHYSICAL   PRIMARY CARE PHYSICIAN:  Neta Mends. Panosh, M.D.   CHIEF COMPLAINT:  Headaches and vomiting.   HISTORY OF PRESENT ILLNESS:  The patient is a 70 year old female with  past medical history significant for hypertension, moderate tricuspid  regurgitation, hyperlipidemia, polymyalgia rheumatica, and recently  treated symptomatically for diarrhea by her primary care physician, who  presents with the above complaints.  She states that she has had  headaches for the past 3 days and nausea/vomiting for the past 1 week.  She states that she was in her usual state of health until about 13 days  ago when she began having diarrhea - described as watery stools, about 3  a day, nonbloody.  She states that on the fifth day of having the  diarrhea she had a syncopal episode, and when she came to it she was on  the floor.  She states that she did not see her primary care physician  on that same day, but about 2 days later she followed up with her  primary care physician because the diarrhea was persisting, and she was  prescribed antidiarrheal medications which she took for about 3 days  without relief, and so she stopped it.  She states that she also had  taken Imodium initially, but that also did not help.  She reports that  usually she has some abdominal pain just prior to having a bowel  movement, but it is relieved after she has the bowel movement.  Again,  she has been vomiting about 3 times a day for the past 5 days, no  hematemesis.  She denies fevers, cough, chest pain, shortness of breath,  and no dysuria.   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  Neurosurgery was  consulted per ER physician, and no surgical intervention at this time.  She is admitted for further evaluation and management.   PAST MEDICAL HISTORY:  1. As above.  2. History of high potassium requirement.  3. History of internal and external hemorrhoids - status post surgery.   MEDICATIONS:  1. Amitriptyline 25 mg 1-2 tablets daily.  2. Aspirin 81 mg.  3. Caltrate 600 D p.o. b.i.d.  4. Coreg 18.75 mg b.i.d.  5. Cozaar 50 mg daily.  6. Doryx 1 tablet daily.  7. Enalapril 20 mg b.i.d.  8. Fosamax q.week.  9. Lasix 20 mg daily.  10.K-Dur 60 mEq q.a.m., and 40 mEq at noon.  11.Lipitor 10 mg daily.  12.Multivitamins.  13.Norvasc 5 mg daily.  14.Prednisone 7.5 mg daily.  15.Vicodin.   ALLERGIES:  CODEINE.   SOCIAL HISTORY:  She denies tobacco.  Also denies alcohol.   FAMILY HISTORY:  Her mother had a stroke.  She also had MIs, the first  one at age 49.  Her father had an MI at the age of 38.   REVIEW OF SYSTEMS:  As per HPI.  Other review of systems negative.   PHYSICAL EXAMINATION:  GENERAL:  The patient is an elderly female.  She  is alert, in no apparent distress.  VITAL SIGNS:  Her blood pressure is 139/85, temperature 97.6, pulse of  65, respiratory rate 18, O2 saturation 98.  HEENT:  PERRL.  EOMI.  Sclerae anicteric.  Moist mucous membranes.  NECK:  Supple.  No adenopathy, no thyromegaly.  No JVD.  No carotid  bruits appreciated.  LUNGS:  Clear to auscultation bilaterally.  No crackles or wheezes.  CARDIOVASCULAR:  Regular rate and rhythm.  Normal S1 and S2.  ABDOMEN:  Soft.  Bowel sounds present.  Nontender, nondistended.  No  organomegaly, and no masses palpable.  EXTREMITIES:  No cyanosis, and no edema.  NEUROLOGIC:  She is alert and oriented x3.  Cranial nerves II-XII  grossly intact.  Strength is  5/5 and symmetric.  Nonfocal exam.   ASSESSMENT AND PLAN:  1. 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      Will hold off aspirin, blood pressure control.  As above,      neurosurgery consulted per ER, and no surgical intervention.  Will      obtain an MRI to further evaluate as recommended.  2. Recent syncope.  Likely secondary to orthostatic hypotension at the      time, as the patient reports diarrhea for the past 13 days.  Will      obtain orthostatic vital signs, hydrate as appropriate.  Obtain      cardiac enzymes, carotid Doppler ultrasound, 2D echocardiogram.      Imaging studies as above.  3. Diarrhea.  Obtain stool studies.  Supportive care.  4. Hypertension.  Continue outpatient medications.  5. Polymyalgia rheumatica.  Continue prednisone.  6. History of high potassium requirement.  Potassium today is 5.1.      Will hold potassium for today, recheck in the a.m., and continue      repleting as appropriate.  7. Hyperlipidemia.  Continue Lipitor.      Kela Millin, M.D.  Electronically Signed     ACV/MEDQ  D:  03/27/2007  T:  03/27/2007  Job:  [id number]   cc:   Neta Mends. Fabian Sharp, MD  Cecil Cranker, MD, [facility]

## 2010-12-19 NOTE — Discharge Summary (Signed)
[name], [name]            ACCOUNT NO.:  [id number]   MEDICAL RECORD NO.:  [id number]          PATIENT TYPE:  INP   LOCATION:  1440                         FACILITY:  [facility]   PHYSICIAN:  [name], MDDATE OF BIRTH:  [birth year]   DATE OF ADMISSION:  03/26/2007  DATE OF DISCHARGE:                               DISCHARGE SUMMARY   DATE OF BIRTH:  [birth year]   DATE OF ADMISSION:  March 27, 2007   DATE OF DISCHARGE:  March 28, 2007   DISCHARGE DIAGNOSES:  1. Syncope secondary to dehydration with orthostatic tilt.  No other      cardiac or neurologic abnormality to account for symptoms.      Improved and stable for discharge home.  Outpatient follow up on      echo result see below.  2. Headache with findings of left subdural hematoma frontal region,      presumed chronic on CT, status post a Neurosurgical evaluation who      recommended outpatient follow up with repeat CT in one week.      Symptomatically improved during this hospitalization.  Tylenol      p.r.n.  3. Diarrhea with vomiting times two weeks prior to admission, resolved      within the 24-hours of admission.  No recurrence.  Tolerating      regular p.o. diet.  4. Dehydration secondary to GI illness, status post IV fluids and      improved.  5. 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  No      change with IV fluid. Outpatient follow with primary MD.  6. Dyslipidemia.  Continue home statin.  7. PMR, on chronic prednisone.  No acute changes.  8. History of hypertension.  See above regarding orthostatic tilt.  We      will hold Coreg and Lasix times 48-hours.  9. History of high potassium requirements.   DISCHARGE MEDICATIONS:  Include the recommendation to hold her Coreg,  Lasix, and potassium for 48-hours, then may resume Monday, August 25th,  as before.  This includes the following:  1. Coreg 18.75 mg p.o. b.i.d.  2. Lasix 20 mg p.o. daily.  3. Potassium 40 mEq  daily.  Other medications are as before without change and include:  1. Elavil 25 mg q.h.s.  2. Aspirin 81 mg daily.  3. Calcium supplement 600 mg b.i.d.  4. Cozaar 50 mg daily.  5. Enalapril 20 mg b.i.d.  6. Fosamax one tablet q. Friday.  7. Lipitor 10 mg daily.  8. Multivitamin once daily.  9. Norvasc 5 mg daily.  10.Prednisone 7.5 mg daily.  11.Vicodin one to two as needed every six hours for pain   DISPOSITION:  She is discharged home in medically stable and improved  condition.  She does still have a mild orthostatic tilt, but she is  asymptomatic with these changes and is anxious for discharge home.   Hospital follow-up is as already scheduled with her primary care  physician, Dr. Berniece [name], for next week, Wednesday, August 28th, at  2:15 p.m.  At that appointment, Dr. Fabian [name] will  reschedule a repeat head  CT to evaluate subdural hematoma evolution and/or refer to neurosurgeon  Dr. Venetia [name], her regular neurosurgeon, for evaluation if and as needed.  The patient is instructed to call MD or return to the Emergency Room if  increased worsening headache, passing out, or other problems prior to  her appointment over the weekend in the time following discharge.   HOSPITAL COURSE BY PROBLEM:  Headache with incidental left subdural.  The patient is a 70 year old woman who came to the Emergency Room after  falling and passing out in her garage.  She was experiencing head pain  and felt dehydrated as this occurred following two weeks of diarrhea  with vomiting.  In the Emergency Room a head CT did show a left subdural  hematoma which was felt to be chronic in nature, but due to the unclear  nature of this diagnosis along with the timeline of her symptoms she was  referred for admission.  Also concerns due to persistent GI symptoms of  nausea and vomiting were to be addressed during this hospitalization,  however, her GI symptoms did not recur.  She has had no nausea,  vomiting, or  any diarrhea in the 48-hours since her admission and has  tolerated a regular diet without recurrence.  She was orthostatic likely  from the previous dehydration and given IV fluid for the associated  dehydration.  With this treatment the patient's overall complaints  resolved and she feels normal and well for discharge.  She was seen in  brief consultation by the neurosurgeon on call who felt that an MRI was  not necessary and recommended only CT of her head and observation.  He  felt that if outpatient follow up is necessary we will refer back to Dr.  Venetia [name] of [facility] as she has been a patient there prior, but has no  other new recommendations at this time.  With all this in mind, as the  patient has symptomatically improved and has no other further need for  inpatient workup and is tolerating p.o. diet, we will allow discharge  home with close outpatient follow up by her primary MD.  Because of  persisting mild orthostatic tilt despite lack of symptoms, given syncope  prior to admission, I have asked that she hold off on her Coreg and  Lasix for the next 48-hours to give further time for restabilization  following her GI illness.  She has had no further syncope.  Telemetry  was negative, cardiac enzymes were negative, and echo was checked on the  day prior to discharge and results on this may be followed up as an  outpatient, as again it is felt to not be related to cardiogenic  syncope.  Her other medications may be continued as before for her  chronic medical problems including PMR, dyslipidemia, and history of  hypertension.  This is reviewed with the patient who understands plan  and agrees with this and understands follow up and treatment.      Alexis A. Felicity Coyer, MD  Electronically Signed     VAL/MEDQ  D:  03/28/2007  T:  03/29/2007  Job:  [id number]

## 2010-12-19 NOTE — Assessment & Plan Note (Signed)
[facility]                            CARDIOLOGY OFFICE NOTE   NAME:[name], [name]                   MRN:          [id number]  DATE:07/16/2007                            DOB:          March 05, 1941    [name] is doing well.  We know that she has had some mitral valve  prolapse in the past.  In August 2008 the patient had syncope.  It  turned out that she had had diarrhea and was dehydrated.  She was  admitted.  She had a very complete evaluation including neurosurgical  workup.  She has improved over time.  We do not think any of the  difficulties were cardiac in origin.  During that hospitalization she  did have a 2-D echocardiogram.  Her ejection fraction was 60%.  Her  mitral valve revealed mild to moderate prolapse of the posterior leaflet  but there was no valve thickening and there was no significant mitral  regurgitation.  Right ventricular systolic pressure estimate was in the  range of 30 mmHg.  She had carotid Dopplers.  Vertebral flow was  antegrade.  There was no significant internal carotid artery stenosis.  She did have hyponatremia.  She has had some followup labs since then  but they are not available to me in E-chart and we will check a BMET  today.   PAST MEDICAL HISTORY:  Allergies:  No known drug allergies.   Medications:  Lasix 20, Lipitor 10, Cozaar, aspirin, enalapril,  Caltrate, glucosamine, Norvasc, Fosamax, carvedilol, amitriptyline,  Doryx, K-Dur, prednisone.   Other medical problems:  See the list below.   REVIEW OF SYSTEMS:  She is doing well and her review of systems is  negative.   PHYSICAL EXAMINATION:  Weight is 173 pounds.  Blood pressure is 145/72.  Her pulse is 79.  The patient is oriented to person, time and place.  Affect is normal.  HEENT:  Reveals no xanthelasma.  She has normal extraocular motion.  There are no carotid bruits.  There is no jugular venous distention.  CARDIAC:  Reveals an S1 with  an S2.  There are no clicks or significant  murmurs heard.  ABDOMEN:  Soft.  She has no peripheral edema.   PROBLEMS:  1. History of hypertension that is difficult to control.  She is on      many medicines but it is controlled at this time.  2. History of mitral valve prolapse.  See the recent echocardiogram      data.  She has prolapse but no significant MR and this can be      followed.  3. Mild to moderate tricuspid regurgitation.  Right ventricular      pressure in the range of 30 mmHg.  4. Hyperlipidemia.  Her labs are nicely controlled.  5. Normal left ventricular function.  6. Polymyalgia rheumatica.  7. Prednisone therapy for her polymyalgia rheumatica.  8. High potassium requirement over time.  9. Syncope in August 2008 due to dehydration.  10.Headache with findings of a left subdural.  This was presumed to be      chronic  and she was seen by neurosurgery.  11.Diarrhea in August, which resolved.  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   She is stable.  We will renew her prescriptions as needed and check a  BMET today.     Luis Abed, MD, [facility]  Electronically Signed    JDK/MedQ  DD: 07/16/2007  DT: 07/17/2007  Job #: [id number]

## 2010-12-19 NOTE — Assessment & Plan Note (Signed)
[facility]                            CARDIOLOGY OFFICE NOTE   NAME:Salvo, ABAGAYLE KLUTTS                   MRN:          [id number]  DATE:01/22/2008                            DOB:          [birth year]    HISTORY OF PRESENT ILLNESS:  Ms. Polinsky is doing well.  She has mild  mitral valve prolapse.  She has recovered nicely from the problem she  had in the past year.  Her blood pressure is under good control.  Dr.  Phylliss Bob  has been able to titrate her prednisone down and she is now down  to 3 mg daily.  She has mild mitral valve prolapse.  There is no  significant MR.   PAST MEDICAL HISTORY:   ALLERGIES:  No known drug allergies.   MEDICATIONS:  1. Lasix 20.  2. Lipitor 10.  3. Cozaar 50.  4. Aspirin 81.  5. Enalapril 20 b.i.d.  6. Caltrate.  7. Glucosamine.  8. Norvasc 5.  9. Primrose.  10.Biotin.  11.Fosamax.  12.Carvedilol 18.75 b.i.d.  13.Amitriptyline.  14.Doryx.  15.Prednisone 3 mg daily.   OTHER MEDICAL PROBLEMS:  See the list on my note of July 14, 2007.   REVIEW OF SYSTEMS:  She is actually doing well.  She has no complaints  and review of systems is negative.   PHYSICAL EXAMINATION:  VITAL SIGNS:  Blood pressure is 132/82 with a  pulse of 67.  The patient is oriented to person, time, and place.  Affect is normal.  HEENT:  No xanthelasma.  She has normal extraocular motion.  NECK:  There are no carotid bruits.  There is no jugular venous  distention.  LUNGS:  Clear.  Respiratory effort is not labored.  CARDIAC:  S1 with an S2.  There no clicks or significant murmurs.  ABDOMEN:  Soft.  She has no peripheral edema.   LABORATORY DATA:  No labs were done today.   PROBLEMS:  Problems are listed in the note of July 16, 2007.  Historically, her blood pressure has been difficult to control, but it  is nicely controlled at this time.  Her mitral valve prolapse can  certainly be followed.  She is not having any recurrent  other  significant cardiac problems.  I will see her back in 6 months.  We will  obtain a BMET and a fasting lipid profile.     Luis Abed, MD, [facility]  Electronically Signed    JDK/MedQ  DD: 01/22/2008  DT: 01/23/2008  Job #: [id number]   cc:   Neta Mends. Fabian Sharp, MD  Areatha Keas, M.D.

## 2010-12-19 NOTE — Assessment & Plan Note (Signed)
[facility]                            CARDIOLOGY OFFICE NOTE   NAME:[name], Alexis EBERLE                   MRN:          [id number]  DATE:07/09/2008                            DOB:          Feb 21, 1941    Ms. Stencil is doing very well.  Her blood pressure had been difficult  to control.  It is now nicely controlled.  She is going about full  activities.  She takes Lipitor for her lipids, and she is doing very  well.  Dr. Phylliss Bob is working with her for her rheumatologic problems.  She  is not having any chest pain or shortness of breath.   ALLERGIES:  No known drug allergies.   MEDICATIONS:  See the flow sheet in the chart and in EMR.   REVIEW OF SYSTEMS:  She is not having any shortness of breath or chest  pain.  She has no skin rashes.  Otherwise, her review of systems is  negative.   PHYSICAL EXAMINATION:  VITAL SIGNS:  Blood pressure is 138/82 with a  pulse of 61.  GENERAL:  The patient is oriented to person, time, and place.  Affect is  normal.  HEENT:  No xanthelasma.  She has normal extraocular motion.  NECK:  There are no carotid bruits.  There is no jugular venous  distention.  LUNGS:  Clear.  Respiratory effort is not labored.  CARDIAC:  S1 and S2.  There are no clicks or significant murmurs.  ABDOMEN:  Soft.  EXTREMITIES:  She has no peripheral edema.   EKG today is normal.   Problems are listed extensively on my note of July 16, 2007.  1. Hypertension, nicely controlled.  2. Hyperlipidemia.  We will repeat her labs at this time.   The patient is stable.  She needs no further cardiac workup at this  time.  I will see her back in 6 months.     Luis Abed, MD, [facility]  Electronically Signed    JDK/MedQ  DD: 07/09/2008  DT: 07/10/2008  Job #: [id number]   cc:   Neta Mends. Fabian Sharp, MD

## 2010-12-19 NOTE — Assessment & Plan Note (Signed)
[facility]                            CARDIOLOGY OFFICE NOTE   [name], [name]                   MRN:          [id number]  DATE:01/22/2007                            DOB:          03/23/1941    [name] has been a patient of Dr. Gabriel Rung Thebes's. With his  retirement, I am taking over her care. She has had hypertension that has  been somewhat difficult to control. There has been no evidence of renal  artery stenosis. She does have a history of mild mitral valve prolapse.  Her last echocardiogram was done in 2003. In his most recent exams, Dr.  Gabriel Rung had not heard a murmur. Her pressure has been under better control  recently.   Most recently, she had significant diffuse aches and pains, and  ultimately a diagnosis of polymyalgia rheumatica (PMR) was made by Dr.  Estill Bakes. She is on prednisone and the dose is being tapered. She is  feeling much better. She has gained weight. Dr. Fabian Sharp is watching her  glucose carefully and a hemoglobin a1C will be ordered with our labs.   PAST MEDICAL HISTORY:   ALLERGIES:  No known drug allergies.   MEDICATIONS:  1. Lasix 20 mg.  2. Lipitor 10 mg.  3. Cozaar 50 mg.  4. Aspirin 81 mg.  5. Enalapril 20 mg b.i.d.  6. Caltrate.  7. Glucosamine.  8. Norvasc 5 mg.  9. Fosamax weekly.  10.Carvedilol 18.75 mg b.i.d.  11.Amitriptyline.  12.Doryx.  13.K-Dur 60 mEq in the morning and 40 mEq at noon.  14.Prednisone with the current dose at 10 mg daily.   OTHER MEDICAL PROBLEMS:  See the list below.   REVIEW OF SYSTEMS:  She has had some slight ankle swelling. Otherwise,  her review of systems is negative.   PHYSICAL EXAMINATION:  VITAL SIGNS:  Weight 170 pounds. This is  increased by 4 pounds. Blood pressure 136/92, pulse 76.  GENERAL:  The patient is oriented to person, place, and time. Affect is  normal.  HEENT:  Reveals no xanthelasma, she has normal extraocular motion.  NECK:  There are no  carotid bruits. There is no jugular venous  distension.  LUNGS:  Clear. Respiratory effort is not labored.  CARDIAC:  Reveals an S1 with an S2. I do not hear a mitral regurgitation  murmur today.  ABDOMEN:  Soft. There are no masses or bruits.  EXTREMITIES:  She has normal distal pulses. There is no peripheral  edema. There are no musculoskeletal deformities.   PROBLEMS:  1. History of hypertension that has been difficult to control. Most      recently, it is reasonably controlled. Her pressure has been normal      at recent doctor visits. There is slight increase in her diastolic      pressure today and I am not inclined to change her medicines. There      is no history of renal artery stenosis.  2. History of mild mitral valve prolapse. Last echocardiogram was done      in 2003. There was trace mitral regurgitation.  3. Moderate  tricuspid regurgitation with a right ventricular systolic      pressure estimate in 30-40 mm range in 2003. When I see her back      next time, we will consider whether a repeat echocardiogram is      appropriate.  4. Hyperlipidemia. Her lipids have been very nicely controlled on low      dose Lipitor. It is time for a fasting lipid panel.  5. History of normal left ventricular function.  6. Current diagnosis of PMR.  7. Prednisone therapy for her PMR.  8. High potassium requirement. This will be kept in mind over time.   The patient is stable. She will have a lipid profile with liver, BMET,  and a hemoglobin a1C.     Luis Abed, MD, [facility]  Electronically Signed    JDK/MedQ  DD: 01/22/2007  DT: 01/23/2007  Job #: [id number]   cc:   Neta Mends. Fabian Sharp, MD  Areatha Keas, M.D.

## 2010-12-22 NOTE — Assessment & Plan Note (Signed)
[facility]                            CARDIOLOGY OFFICE NOTE   NAME:Tuckerman, [name]                   MRN:          [id number]  DATE:07/24/2006                            DOB:          1941/07/10    [name] is a very pleasant 70 year old  white married female  with difficult to control hypertension and no evidence of renal artery  stenosis.   She has a history of mitral valve prolapse and hyperlipidemia.  Murmur  has not been heard recently.   We recently changed her to Coreg and planning to up-titrate it.  She is  here for followup and having no symptoms.   MEDICATIONS:  Include Lasix 20, Lipitor 10, Cozaar 50, aspirin 81 5 days  a week, enalapril 20 b.i.d., Caltrate, __________, glucosamine, Norvasc  5, K-Dur 20 100 mEq a day.  Fosamax, Amitriptyline, Mobic 15 mg daily,  Coreg 12.5 b.i.d.   The patient states that her pressures at home usually run in the 130/80s  range.   PHYSICAL EXAM:  Blood pressure 130/85, pulse 71, normal sinus rhythm.  GENERAL APPEARANCE:  Normal.  JVPs not elevated.  Carotid pulses palpable and equal without bruits.  LUNGS:  Clear.  CARDIAC:  Midsystolic click  with no murmur.  EXTREMITIES:  Unremarkable.   IMPRESSION:  Hypertension.  Fairly well-controlled.   PLAN:  We plan to up-titrate the Coreg to 18.75 b.i.d. and I  will see  her back in 2 months.  I should note that her recent renal profile  revealed a sodium of 143, potassium 3.8, BUN 13, and creatinine 0.8,  glucose 114.     E. Graceann Congress, MD, [facility]  Electronically Signed    EJL/MedQ  DD: 07/24/2006  DT: 07/24/2006  Job #: [id number]

## 2010-12-22 NOTE — Assessment & Plan Note (Signed)
[facility]                              CARDIOLOGY OFFICE NOTE   NAME:Gracie, FLORENE BRILL                   MRN:          [id number]  DATE:06/12/2006                            DOB:          29-Oct-1940    The patient is a very pleasant 70 year old white married female with  difficult to control hypertension, no evidence of renal artery stenosis.  She has had history of vaginal prolapse, hyperlipidemia.  No murmur has been  heard recently.  On her last visit, March 05, 2006, we changed her from  Toprol-XL to Coreg CR 20 and planned to uptitrate it; however, she noted  that there was no change in her blood pressure and she changed back to  Toprol-XL 50.  Her pressures actually have been in the 111 to 136 range and  occasionally 140s.   PHYSICAL EXAMINATION:  VITAL SIGNS:  Today, blood pressure is 138/92, pulse  68, normal sinus rhythm.  GENERAL:  Normal.  NECK:  JVD is not elevated.  Carotid pulses are normal.  LUNGS:  Clear.  CARDIAC:  Unremarkable.   EKG is normal.   I have suggested that the patient change back to Coreg, and we will give her  this in the form of 12.5 twice a day and see her back in 6 weeks.  Uptitrate  as necessary.    ______________________________  E. Graceann Congress, MD, [facility]    EJL/MedQ  DD: 06/12/2006  DT: 06/13/2006  Job #: [id number]

## 2010-12-22 NOTE — Assessment & Plan Note (Signed)
[facility]                                   ON-CALL NOTE   NAME:[name], [name]                   MRN:          [id number]  DATE:03/09/2006                            DOB:          1940-09-09    PROBLEM:  [name] called me this afternoon, reporting that her blood  pressure is not improved since being switched from Toprol 50 daily to Coreg  CR 20 daily, just a few days ago.   The patient was seen in our office by Dr. Corinda Gubler on March 05, 2006. She is  on multiple medications for hypertension and had a blood pressure of 134/86  with a pulse of 83. Dr. Corinda Gubler consulted with Dr. Gala Romney, and the  recommendation was to switch from Toprol to Coreg CR 20 daily. He did also  suggest adding Apresoline if her blood pressure remained refractory.   The patient contacts me today to tell me that her systolic is 159 and her  pulse has been about 105 or so. She is not having any significant symptoms,  but is concerned because her blood pressure has not responded. She just  started on the Coreg 2 days ago.   I advised [name] that it would be best to continue close monitoring  with twice daily blood pressure checks, before making the decision of  whether or not to further up-titrate her Coreg or, as he was wondering,  place her back on Toprol. I explained to her that it was not entirely clear  to me as to why she was taken off Toprol and that she was only on a dose of  50 daily. Therefore, I instructed her to contact our office early next week  and that if she continues to have persistently elevated blood pressure  reading, to discuss this issue with Dr. Corinda Gubler, as to whether or not her  medication should be further adjusted. She was quite agreeable with this  plan and will continue checking her blood pressure first thing in the  morning and will continue checking her blood pressure on a twice daily basis  to have an accurate log of her blood  pressure readings.                                   Gene Serpe, PA-C                                Cecil Cranker, MD, [facility]   GS/MedQ  DD:  03/09/2006  DT:  03/09/2006  Job #:  [id number]

## 2010-12-22 NOTE — Assessment & Plan Note (Signed)
[facility]                              CARDIOLOGY OFFICE NOTE   NAME:[name], [name]                   MRN:          [id number]  DATE:03/05/2006                            DOB:          19-Mar-1941    ADDENDUM:  I have discussed the patient with Dr. Gala Romney, and we plan to  change her from Toprol to Coreg CR 20 mg, and titrate up from there.  If her  blood pressure is still not controlled, consider adding Apresoline.                              Cecil Cranker, MD, [facility]    EJL/MedQ  DD:  03/05/2006  DT:  03/06/2006  Job #:  [id number]

## 2010-12-22 NOTE — Op Note (Signed)
[facility]  Patient:    [name], [name] Visit Number: [id number] MRN: [id number]          Service Type: DSU Location: [facility] Attending Physician:  Vikki Ports Dictated by:   Vikki Ports, M.D. Proc. Date: 08/07/01 Admit Date:  08/07/2001 Discharge Date: 08/08/2001                             Operative Report  PREOPERATIVE DIAGNOSIS:  Internal and external hemorrhoids.  POSTOPERATIVE DIAGNOSIS:  Internal and external hemorrhoids.  PROCEDURE:  Complex three-quadrant hemorrhoidectomy.  SURGEON:  Vikki Ports, M.D.  ANESTHESIA:  General.  DESCRIPTION OF PROCEDURE:  The patient was taken to the operating room and placed in a supine position.  After adequate anesthesia was induced into the endotracheal tube, the patient was placed in the prone jackknife position. Perianal and rectal prep were undertaken.  The buttocks were taped apart and beginning in the right posterior hemorrhoidal bundle, it was injected with Marcaine and Wydase.  The perianal skin was grasped.  The hemorrhoid was incised.  It was dissected off the sphincter and muscles down to the apex. This was clamped with a Buie clamp, and the defect was closed with a running interlocking 2-0 chromic suture from the apex to the anoderm.  Anoderm was closed using a running 3-0 chromic suture.  The right anterior and left lateral hemorrhoidal bundles were excised in the identical fashion.  Defects were closed also in the identical fashion.  All tissues were then injected using an additional 10-15 cc of 0.5% Marcaine, and Gelfoam packing was placed in the rectum.  The patient tolerated the procedure well and went to PACU in good condition. Dictated by:   Vikki Ports, M.D. Attending Physician:  Danna Hefty R. DD:  08/07/01 TD:  08/07/01 Job: [id number]

## 2010-12-22 NOTE — Assessment & Plan Note (Signed)
[facility]                            CARDIOLOGY OFFICE NOTE   NAME:[name], [name]                   MRN:          [id number]  DATE:10/10/2006                            DOB:          1941-02-03    A very pleasant 70 year old white married female with difficult to  control hypertension.  There is no evidence of renal artery stenosis.  Patient has a history of mitral valve prolapse and hyperlipidemia;  however, I have not heard a murmur recently.  She was changed from  Toprol to Coreg and is now up to 18.75 b.i.d.  Pressures have been  better controlled.  Over the past 3 weeks, however, she has developed  musculoskeletal discomfort in her limbs and somewhat in her joints,  stiffness, she has seen Dr. Fabian Sharp who found that she had some evidence  of inflammatory process, referred her to Dr. Jimmy Footman, a  rheumatologist.  She is on Lasix 20 a day, Lipitor 10, Cozaar 50,  aspirin 81, enalapril 20 b.i.d., Caltrate, multivitamin, Doryx,  glucosamine, Norvasc 5 mg daily, K-Dur 100 daily, Fosamax, carvedilol,  Mobic 15 daily, amitriptyline _   Blood pressure 132/87, pulse 63,  Normal sinus rhythm  GENERAL APPEARANCE:  JVP is not elevated, carotid pulses bilaterally  without bruits.  LUNGS:  Clear.  CARDIAC EXAM:  Reveals no murmur.  There is some swelling of her fingers, stiffness.   EKG is _normal.   IMPRESSION:  Diagnoses as above.  Possible rheumatoid arthritis.  I have  suggested continuing the same therapy; however, return in 3-4 months for  follow up with Dr. Myrtis Ser.  Plan to get a BMP today.     Cecil Cranker, MD, [facility]  Electronically Signed    EJL/MedQ  DD: 10/10/2006  DT: 10/10/2006  Job #: [id number]

## 2010-12-22 NOTE — Assessment & Plan Note (Signed)
[facility]                              CARDIOLOGY OFFICE NOTE   NAME:[name], [name]                   MRN:          [id number]  DATE:03/05/2006                            DOB:          [birth year]    The patient is a very pleasant 70 year old, white, married female with  difficult to control hypertension. No evidence of renal artery stenosis.  Mitral valve prolapse and hyperlipidemia.  Echo has revealed mitral valve  prolapse and trace mitral regurgitation in the past.  However, I have not  heard the murmur or click recently.   MEDICATIONS:  1.  Lasix 20 mg daily.  2.  Lipitor 10 mg daily.  3.  Toprol XL 50.  4.  Cozaar 50 mg.  5.  Aspirin 81 mg five times a week.  6.  Enalapril 20 mg b.i.d.  7.  Vitamins.  8.  Glucosamine.  9.  Norvasc 5 mg.  10. K-Dur 100 daily.  11. Fosamax.  12. Amitriptyline.  13. Mobic daily.   LABORATORY DATA:  Recent labs in April revealed normal renal function,  potassium 4.3, BUN 11, creatinine 0.8, total cholesterol 142, LDL 69.   PHYSICAL EXAMINATION:  VITAL SIGNS:  Blood pressure 134/86, pulse 83. Normal  sinus rhythm.  GENERAL APPEARANCE:  Normal.  NECK:  JVP is not elevated.  Carotid pulses palpable and equal.  LUNGS:  Clear.  CARDIOVASCULAR:  Normal without murmur or click.  EXTREMITIES:  Normal.   ADDENDUM:  I should note on a couple of occasions she has had rather sharp,  brief chest pain while eating and she wondered if she had a hiatal hernia.  She also had occasional edema above her ankles but less since Norvasc  decreased to 5.   EKG normal.   DIAGNOSIS:  As above.   Should she have recurrent pain on swallowing, I think she should see Dr. Victorino Dike.   We plan to recheck the BNP and lipids in a couple of months.  Am not adding  additional medications at this time.   ADDENDUM:  I have discussed the patient with Dr. Gala Romney, and we plan to  change her from Toprol to Coreg CR 20  mg, and titrate up from there.  If her  blood pressure is still not controlled, consider adding Apresoline.                              Cecil Cranker, MD, [facility]    EJL/MedQ  DD:  03/05/2006  DT:  03/06/2006  Job #:  [id number]

## 2011-01-30 ENCOUNTER — Encounter: Payer: Self-pay | Admitting: Cardiology

## 2011-02-02 ENCOUNTER — Other Ambulatory Visit: Payer: Self-pay | Admitting: Dermatology

## 2011-02-05 ENCOUNTER — Ambulatory Visit (INDEPENDENT_AMBULATORY_CARE_PROVIDER_SITE_OTHER): Payer: Medicare Other | Admitting: Internal Medicine

## 2011-02-05 ENCOUNTER — Encounter: Payer: Self-pay | Admitting: Internal Medicine

## 2011-02-05 ENCOUNTER — Ambulatory Visit: Payer: Medicare Other | Admitting: Internal Medicine

## 2011-02-05 VITALS — BP 118/64 | HR 72 | Ht 64.0 in | Wt 159.0 lb

## 2011-02-05 DIAGNOSIS — Z1211 Encounter for screening for malignant neoplasm of colon: Secondary | ICD-10-CM

## 2011-02-05 DIAGNOSIS — Z9283 Personal history of failed moderate sedation: Secondary | ICD-10-CM

## 2011-02-05 DIAGNOSIS — Z86010 Personal history of colonic polyps: Secondary | ICD-10-CM

## 2011-02-05 MED ORDER — PEG-KCL-NACL-NASULF-NA ASC-C 100 G PO SOLR: 1.0000 | Freq: Once | ORAL | Status: DC

## 2011-02-05 NOTE — Progress Notes (Signed)
  Subjective:    Patient ID: [name], female    DOB: 02-19-1941, 70 y.o.   MRN: [id number]  HPI 70 year old white woman previously followed by my retired partner. Colonoscopy about 7 years ago did not show polyps but she has had adenomatous polyps in the past. Also has a grandmother with colon cancer. GI complaints at this time. At her last colonoscopy 150 mcg fentanyl 13 mg Versed was used and she remembers waking up at some point but in general she does not have bad memories of the procedure.    Review of Systems     Objective:   Physical Exam Well-developed well-nourished no acute distress Lungs clear Heart S1-S2 no progression gallops       Assessment & Plan:  1 personal history of adenomatous colon polyps, known on last colonoscopy 7 years ago  #2 screening for colorectal cancer, somewhat higher risk due to history of adenomatous polyps and possibly with her grandmother's history are sent as a second degree relative.  #3 personal history show moderate sedation  We'll pursue routine screening and surveillance colonoscopy but will use deep sedation with peripheral given the problems with the last procedure. Does not recall those being an issue but I think she'll do better exam and it will be easier to examine her colon and likely be more accurate with the deep sedation used.

## 2011-02-05 NOTE — Patient Instructions (Signed)
You have been scheduled for a Colonoscopy with Propofol with separate instructions given. Your prep kit has been sent to your pharmacy for you to pick up.

## 2011-02-06 ENCOUNTER — Encounter: Payer: Self-pay | Admitting: Internal Medicine

## 2011-02-06 DIAGNOSIS — Z9283 Personal history of failed moderate sedation: Secondary | ICD-10-CM | POA: Insufficient documentation

## 2011-02-06 DIAGNOSIS — Z860100 Personal history of colon polyps, unspecified: Secondary | ICD-10-CM | POA: Insufficient documentation

## 2011-02-20 ENCOUNTER — Encounter: Payer: Self-pay | Admitting: Internal Medicine

## 2011-02-20 ENCOUNTER — Ambulatory Visit (AMBULATORY_SURGERY_CENTER): Payer: Medicare Other | Admitting: Internal Medicine

## 2011-02-20 VITALS — BP 157/82 | HR 60 | Temp 97.2°F | Resp 17 | Ht 64.0 in | Wt 159.0 lb

## 2011-02-20 DIAGNOSIS — Z1211 Encounter for screening for malignant neoplasm of colon: Secondary | ICD-10-CM

## 2011-02-20 DIAGNOSIS — K648 Other hemorrhoids: Secondary | ICD-10-CM

## 2011-02-20 DIAGNOSIS — Z86010 Personal history of colonic polyps: Secondary | ICD-10-CM

## 2011-02-20 MED ORDER — SODIUM CHLORIDE 0.9 % IV SOLN: 500.0000 mL | INTRAVENOUS | Status: DC

## 2011-02-20 NOTE — Patient Instructions (Addendum)
You may resume your medications as you would normally take them.  Please refer to your blue and neon green sheets for instructions regarding diet and activity for the rest of today. 

## 2011-02-20 NOTE — Progress Notes (Signed)
PROPOFOL ADMINISTERED BY MARK SMITH CRNA PER PROTOCOL. SEE SCANNED INTRA ASSESSMENT SHEET. [facility] RN

## 2011-02-20 NOTE — Progress Notes (Signed)
Pt having trouble passing air. Vitals stable. Pt continue to stay in bed after d/c'd from monitor. Pt did pass gas multiple times. Pt states that she feels much better. Abdomen soft and non-tender.

## 2011-02-21 ENCOUNTER — Telehealth: Payer: Self-pay

## 2011-02-21 NOTE — Telephone Encounter (Signed)

## 2011-03-07 ENCOUNTER — Encounter: Payer: Self-pay | Admitting: Cardiology

## 2011-03-15 ENCOUNTER — Encounter: Payer: Self-pay | Admitting: Cardiology

## 2011-03-15 DIAGNOSIS — E785 Hyperlipidemia, unspecified: Secondary | ICD-10-CM | POA: Insufficient documentation

## 2011-03-15 DIAGNOSIS — I341 Nonrheumatic mitral (valve) prolapse: Secondary | ICD-10-CM | POA: Insufficient documentation

## 2011-03-15 DIAGNOSIS — S065X9A Traumatic subdural hemorrhage with loss of consciousness of unspecified duration, initial encounter: Secondary | ICD-10-CM | POA: Insufficient documentation

## 2011-03-15 DIAGNOSIS — R55 Syncope and collapse: Secondary | ICD-10-CM | POA: Insufficient documentation

## 2011-03-15 DIAGNOSIS — I10 Essential (primary) hypertension: Secondary | ICD-10-CM | POA: Insufficient documentation

## 2011-03-15 DIAGNOSIS — S065XAA Traumatic subdural hemorrhage with loss of consciousness status unknown, initial encounter: Secondary | ICD-10-CM | POA: Insufficient documentation

## 2011-03-15 DIAGNOSIS — R9430 Abnormal result of cardiovascular function study, unspecified: Secondary | ICD-10-CM | POA: Insufficient documentation

## 2011-03-15 DIAGNOSIS — E876 Hypokalemia: Secondary | ICD-10-CM | POA: Insufficient documentation

## 2011-03-15 DIAGNOSIS — E871 Hypo-osmolality and hyponatremia: Secondary | ICD-10-CM | POA: Insufficient documentation

## 2011-03-15 DIAGNOSIS — I071 Rheumatic tricuspid insufficiency: Secondary | ICD-10-CM | POA: Insufficient documentation

## 2011-03-16 ENCOUNTER — Ambulatory Visit (INDEPENDENT_AMBULATORY_CARE_PROVIDER_SITE_OTHER): Payer: Medicare Other | Admitting: Cardiology

## 2011-03-16 ENCOUNTER — Encounter: Payer: Self-pay | Admitting: Cardiology

## 2011-03-16 VITALS — BP 127/77 | HR 62 | Ht 65.0 in | Wt 160.0 lb

## 2011-03-16 DIAGNOSIS — R5383 Other fatigue: Secondary | ICD-10-CM

## 2011-03-16 DIAGNOSIS — I10 Essential (primary) hypertension: Secondary | ICD-10-CM

## 2011-03-16 DIAGNOSIS — R5381 Other malaise: Secondary | ICD-10-CM

## 2011-03-16 DIAGNOSIS — I059 Rheumatic mitral valve disease, unspecified: Secondary | ICD-10-CM

## 2011-03-16 DIAGNOSIS — E876 Hypokalemia: Secondary | ICD-10-CM

## 2011-03-16 DIAGNOSIS — I341 Nonrheumatic mitral (valve) prolapse: Secondary | ICD-10-CM

## 2011-03-16 DIAGNOSIS — E7800 Pure hypercholesterolemia, unspecified: Secondary | ICD-10-CM

## 2011-03-16 NOTE — Progress Notes (Signed)
HPI Patient is seen today to followup mitral valve prolapse.  Also follow her lipids.  I saw her last in January, 2012.  At that time she was cleared for knee surgery.  She has had the surgery and she is doing well.  She is not having any chest pain.  There is no shortness of breath. Allergies  Allergen Reactions  . Codeine     Some sensitivity    Current Outpatient Prescriptions  Medication Sig Dispense Refill  . amitriptyline (ELAVIL) 25 MG tablet 1-2 by mouth daily       . amLODipine (NORVASC) 5 MG tablet       . Ascorbic Acid (VITAMIN C) 1000 MG tablet Take 1,000 mg by mouth daily.        Marland Kitchen aspirin 81 MG chewable tablet Chew 81 mg by mouth daily.        Marland Kitchen atorvastatin (LIPITOR) 10 MG tablet       . Biotin (BIOTIN FORTE) 5 MG TABS Take by mouth.        . Calcium Carbonate-Vitamin D (CALTRATE 600+D) 600-400 MG-UNIT per tablet Take 1 tablet by mouth 2 (two) times daily.       . carvedilol (COREG) 12.5 MG tablet Take 12.5 mg by mouth. As directed      . doxycycline (DORYX) 75 MG EC tablet Take 75 mg by mouth 3 (three) times a week.        . enalapril (VASOTEC) 20 MG tablet Take 1 tablet (20 mg total) by mouth 2 (two) times daily.  60 tablet  11  . Evening Primrose Oil CAPS Take by mouth 2 (two) times daily.        . furosemide (LASIX) 20 MG tablet       . Glucosamine-Chondroitin 750-600 MG TABS Take by mouth.        . losartan (COZAAR) 50 MG tablet Take 1 tablet (50 mg total) by mouth daily.  90 tablet  3  . meloxicam (MOBIC) 15 MG tablet Take 15 mg by mouth daily.       . mometasone (NASONEX) 50 MCG/ACT nasal spray 2 sprays by Nasal route daily.  17 g  2  . potassium chloride SA (K-DUR,KLOR-CON) 20 MEQ tablet Take 3 tabs in the AM and 2 tabs in the PM  450 tablet  10  . vitamin A 40981 UNIT capsule Once daily for 3 mths then off for 1 mth and repeat       . zoledronic acid (RECLAST) 5 MG/100ML SOLN Inject into the vein. yearly        Current Facility-Administered Medications    Medication Dose Route Frequency Provider Last Rate Last Dose  . 0.9 %  sodium chloride infusion  500 mL Intravenous Continuous Iva Boop, MD        History   Social History  . Marital Status: Married    Spouse Name: N/A    Number of Children: 1  . Years of Education: N/A   Occupational History  . Homemaker    Social History Main Topics  . Smoking status: Never Smoker   . Smokeless tobacco: Never Used  . Alcohol Use: No  . Drug Use: No  . Sexually Active: Not on file   Other Topics Concern  . Not on file   Social History Narrative   3 caffeine drinks daily     Family History  Problem Relation Age of Onset  . Heart attack Mother 38  . Heart attack  Father 74  . Coronary artery disease Father     Multiple Family Members  . Esophageal cancer Paternal Grandmother   . Colon cancer Maternal Grandfather   . Irritable bowel syndrome Sister     More family members on father side of     Past Medical History  Diagnosis Date  . Osteoporosis   . Dyslipidemia   . Polymyalgia rheumatica   . HTN (hypertension)   . Mitral valve prolapse     echo, January, 2011, moderate prolapse with your leaflet, trivial MR  . Tricuspid regurgitation     mild to moderate, echo, January, 2011, PA pressure 38 mm mercury  . 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 Hyponatremia   . History of skin cancer   . Rheumatoid arthritis   . Chronic cystitis   . Hemorrhoids   . Hx of colonoscopy   . IBS (irritable bowel syndrome)   . History of colon polyps   . Ejection fraction     EF 65%, echo, 2011  . Potassium (K) deficiency     5 potassium requirement over time  . Syncope     August, 200 weight, dehydration  . 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 S/P knee surgery     clearance done January, 2012    Past Surgical History  Procedure Date  . Excisional hemorrhoidectomy 2003  . Mohs surgery 2011  . Knee surgery  2012    Left   . Colonoscopy 12/23/2003    normal (indications: prior adenomas and grandfather with colon cancer)    ROS  Patient denies fever, chills, headache, sweats, rash, change in vision, change in hearing, chest pain, cough, nausea vomiting, urinary symptoms.  All other systems are reviewed and are negative.  PHYSICAL EXAM Patient is oriented to person time and place.  Affect is normal.  Head is atraumatic.  No jugular venous distention.  Lungs are clear respiratory effort is nonlabored.  Cardiac exam reveals S1 and S2.  There are no murmurs.  The abdomen is soft.  There is no peripheral edema. Filed Vitals:   03/16/11 1442  BP: 127/77  Pulse: 62  Height: 5\' 5"  (1.651 m)  Weight: 160 lb (72.576 kg)    EKG Is done today and reviewed by me.  There is normal sinus rhythm.  ASSESSMENT & PLAN

## 2011-03-16 NOTE — Assessment & Plan Note (Signed)
Over time the patient has required potassium supplements.  Also she is on meloxicam.  We need to check her chemistry at this time and this will be done.  I will then obtain labs before I see her in 6 months for a fasting lipid profile.  At time we will also again check her renal function and her CBC and TSH

## 2011-03-16 NOTE — Patient Instructions (Signed)
Your physician recommends that you schedule a follow-up appointment in: 6 months. The office will mail you a reminder letter 2 months prior appointment date. Your physician recommends that you return for lab work in:  BMET today. Pt needs to have labs ,2 weeks prior 6 months return visit: Fasting Lipid, LFT,BMET, CBC, TSH

## 2011-03-16 NOTE — Assessment & Plan Note (Signed)
Blood pressure stable. No change in therapy. 

## 2011-03-16 NOTE — Assessment & Plan Note (Signed)
Mitral prolapse moderate but there is only trivial MR.  She is stable.  She does not need a followup echo at this time.

## 2011-03-17 LAB — BASIC METABOLIC PANEL WITH GFR
BUN: 17 mg/dL (ref 6–23)
CO2: 24 mEq/L (ref 19–32)
Calcium: 9.8 mg/dL (ref 8.4–10.5)
Chloride: 106 mEq/L (ref 96–112)
Creat: 0.8 mg/dL (ref 0.50–1.10)
GFR, Est African American: >60 mL/min (ref >60)
GFR, Est Non African American: >60 mL/min (ref >60)
Glucose, Bld: 86 mg/dL (ref 70–99)
Potassium: 4.3 mEq/L (ref 3.5–5.3)
Sodium: 140 mEq/L (ref 135–145)

## 2011-03-21 NOTE — Progress Notes (Signed)
Chemistry looks great

## 2011-04-17 ENCOUNTER — Telehealth: Payer: Self-pay | Admitting: *Deleted

## 2011-04-17 NOTE — Telephone Encounter (Signed)
Left message on pt's personal voicemail 

## 2011-04-17 NOTE — Telephone Encounter (Signed)
Per Dr. Fabian Sharp- none of them work great. Can try honey or mucinex plain.

## 2011-04-17 NOTE — Telephone Encounter (Signed)
Pt started with a minimal sore throat on Saturday, no fever, no congestion.Marland Kitchen...just dry cough that has continued, and would like recommendations on what she can take with High Blood pressure.

## 2011-04-27 ENCOUNTER — Encounter: Payer: Self-pay | Admitting: Internal Medicine

## 2011-04-30 ENCOUNTER — Ambulatory Visit (INDEPENDENT_AMBULATORY_CARE_PROVIDER_SITE_OTHER): Payer: Medicare Other | Admitting: Internal Medicine

## 2011-04-30 ENCOUNTER — Encounter: Payer: Self-pay | Admitting: Internal Medicine

## 2011-04-30 VITALS — BP 120/80 | HR 66 | Temp 98.4°F | Wt 162.0 lb

## 2011-04-30 DIAGNOSIS — I10 Essential (primary) hypertension: Secondary | ICD-10-CM

## 2011-04-30 DIAGNOSIS — Z23 Encounter for immunization: Secondary | ICD-10-CM

## 2011-04-30 DIAGNOSIS — J310 Chronic rhinitis: Secondary | ICD-10-CM

## 2011-04-30 DIAGNOSIS — H9319 Tinnitus, unspecified ear: Secondary | ICD-10-CM

## 2011-04-30 NOTE — Progress Notes (Signed)
  Subjective:    Patient ID: [name], female    DOB: 01-Oct-1940, 70 y.o.   MRN: [id number]  HPI Patient comes in today for new somewhat acute problem evaluation.  Onset with cold a week after labor day .This is  now better but now has intermittent ringing in ears  Right ear  predominantly and some better.Took no med for   [employer].  No new meds; no new antibiotic.  No associated sx . Described as  low pitched  initially continual and now intermittent.    Lots of stress recently at home  . She  ? If related.  Exercise classes continue and has  no balance or increase sx  With this activity . Review of Systems No fever headache vision changes falling or headache. No diplopia . Some nasal congestion no face pain  Past history family history social history reviewed in the electronic medical record.     Objective:   Physical Exam Physical Exam: Vital signs reviewed WUJ:WJXB is a well-developed well-nourished alert cooperative  white female who appears her stated age in no acute distress.  HEENT: normocephalic  traumatic , Eyes: PERRL EOM's full, conjunctiva clear, Nares: paten,t no deformity discharge or tenderness., mnimal stuffiness Ears: no deformity EAC's clear TMs with nl bony lm ? No redness or bulging . Mouth: clear OP, no lesions, edema.  Moist mucous membranes. Dentition in adequate repair. NECK: supple without masses, thyromegaly or bruits. CHEST/PULM:  Clear to auscultation and percussion breath sounds equal no wheeze , rales or rhonchi. No chest wall deformities or tenderness. CV: PMI is nondisplaced, S1 S2 no gallops, murmurs, rubs. Peripheral pulses are full without delay  Extremtities:  No clubbing cyanosis or edema,  NEURO:  Oriented x3, cranial nerves 3-12 appear to be intact, no obvious focal weakness,gait within normal limits  or asymmetrical excellent balance  SKIN: No acute rashes normal turgor, color, no bruising or petechiae. PSYCH: Oriented, good eye contact, no  obvious depression anxiety, cognition and judgment appear normal.    See hearing screen  Intact     Assessment & Plan:  Ringing right ear getting better after uri  ? Related  No other alarm features on exam  Poss eustachian tube issue  Ok to restart the nasonex( omnaris oworked best in past)   No assoicated sx however if persisting advise see  Ent.  hypertension controlled Hx of PMR and head trauma concussion a few years ago none recently. Prob underlying rhinitis poss allergic

## 2011-04-30 NOTE — Patient Instructions (Addendum)
It is possible that the cold triggered. your symptoms and should get better in the next week or so. If this is continuing or if getting worse would have ENT evaluate.    Call in 1-2 weeks if we need to refer. Or any time this is worse. The rest of your exam in normal today.

## 2011-05-07 ENCOUNTER — Encounter: Payer: Self-pay | Admitting: Internal Medicine

## 2011-05-09 DIAGNOSIS — H9319 Tinnitus, unspecified ear: Secondary | ICD-10-CM | POA: Insufficient documentation

## 2011-05-11 ENCOUNTER — Other Ambulatory Visit: Payer: Self-pay | Admitting: Cardiology

## 2011-05-14 ENCOUNTER — Other Ambulatory Visit: Payer: Self-pay | Admitting: *Deleted

## 2011-05-14 MED ORDER — AMLODIPINE BESYLATE 5 MG PO TABS: 5.0000 mg | ORAL_TABLET | Freq: Every day | ORAL | Status: DC

## 2011-05-18 LAB — BASIC METABOLIC PANEL
BUN: 6
BUN: 7
CO2: 23
CO2: 24
Calcium: 8.3 — ABNORMAL LOW
Calcium: 8.4
Chloride: 93 — ABNORMAL LOW
Chloride: 99
Creatinine, Ser: 0.49
Creatinine, Ser: 0.54
GFR calc Af Amer: >60
GFR calc Af Amer: >60
GFR calc non Af Amer: >60
GFR calc non Af Amer: >60
Glucose, Bld: 76
Glucose, Bld: 87
Potassium: 3.4 — ABNORMAL LOW
Potassium: 3.8
Sodium: 125 — ABNORMAL LOW
Sodium: 129 — ABNORMAL LOW

## 2011-05-18 LAB — COMPREHENSIVE METABOLIC PANEL
ALT: 13
AST: 25
Albumin: 3.7
Alkaline Phosphatase: 60
BUN: 12
CO2: 22
Calcium: 9.1
Chloride: 96
Creatinine, Ser: 0.6
GFR calc Af Amer: >60
GFR calc non Af Amer: >60
Glucose, Bld: 102 — ABNORMAL HIGH
Potassium: 5.1
Sodium: 124 — ABNORMAL LOW
Total Bilirubin: 1.4 — ABNORMAL HIGH
Total Protein: 6.7

## 2011-05-18 LAB — CBC
HCT: 41.7
HCT: 47 — ABNORMAL HIGH
Hemoglobin: 13.9
Hemoglobin: 15.9 — ABNORMAL HIGH
MCHC: 33.4
MCHC: 33.9
MCV: 88.9
MCV: 90.1
Platelets: 297
Platelets: 356
RBC: 4.63
RBC: 5.29 — ABNORMAL HIGH
RDW: 12.2
RDW: 12.5
WBC: 10.8 — ABNORMAL HIGH
WBC: 8.6

## 2011-05-18 LAB — DIFFERENTIAL
Basophils Absolute: 0
Basophils Relative: 0
Eosinophils Absolute: 0
Eosinophils Relative: 0
Lymphocytes Relative: 22
Lymphs Abs: 1.9
Monocytes Absolute: 0.8 — ABNORMAL HIGH
Monocytes Relative: 9
Neutro Abs: 5.9
Neutrophils Relative %: 69

## 2011-05-18 LAB — FECAL LACTOFERRIN, QUANT: Fecal Lactoferrin: NEGATIVE

## 2011-05-18 LAB — CARDIAC PANEL(CRET KIN+CKTOT+MB+TROPI)
CK, MB: 1.7
CK, MB: 2
Relative Index: INVALID
Relative Index: INVALID
Total CK: 47
Total CK: 49
Troponin I: 0.01
Troponin I: 0.01

## 2011-05-22 ENCOUNTER — Other Ambulatory Visit: Payer: Self-pay | Admitting: Cardiology

## 2011-05-22 MED ORDER — ATORVASTATIN CALCIUM 10 MG PO TABS: 10.0000 mg | ORAL_TABLET | Freq: Every day | ORAL | Status: DC

## 2011-05-22 MED ORDER — FUROSEMIDE 20 MG PO TABS: 20.0000 mg | ORAL_TABLET | Freq: Every day | ORAL | Status: DC

## 2011-05-22 NOTE — Telephone Encounter (Signed)
Pt calling asking to speak with Sabino Gasser regarding new refill needed. Refill request was sent to refill.

## 2011-06-08 ENCOUNTER — Other Ambulatory Visit: Payer: Self-pay | Admitting: Cardiology

## 2011-07-10 ENCOUNTER — Other Ambulatory Visit: Payer: Self-pay

## 2011-07-10 DIAGNOSIS — M810 Age-related osteoporosis without current pathological fracture: Secondary | ICD-10-CM

## 2011-07-11 ENCOUNTER — Encounter: Payer: Self-pay | Admitting: Gynecology

## 2011-07-11 DIAGNOSIS — N870 Mild cervical dysplasia: Secondary | ICD-10-CM | POA: Insufficient documentation

## 2011-07-26 ENCOUNTER — Encounter: Payer: Self-pay | Admitting: Obstetrics and Gynecology

## 2011-07-26 ENCOUNTER — Ambulatory Visit (INDEPENDENT_AMBULATORY_CARE_PROVIDER_SITE_OTHER): Payer: Medicare Other | Admitting: Obstetrics and Gynecology

## 2011-07-26 ENCOUNTER — Other Ambulatory Visit (HOSPITAL_COMMUNITY)
Admission: RE | Admit: 2011-07-26 | Discharge: 2011-07-26 | Disposition: A | Discharge status: Home / Self Care | Payer: Medicare Other | Source: Ambulatory Visit | Attending: Obstetrics and Gynecology | Admitting: Obstetrics and Gynecology

## 2011-07-26 VITALS — BP 124/78 | Ht 64.0 in | Wt 162.0 lb

## 2011-07-26 DIAGNOSIS — M810 Age-related osteoporosis without current pathological fracture: Secondary | ICD-10-CM

## 2011-07-26 DIAGNOSIS — Z124 Encounter for screening for malignant neoplasm of cervix: Secondary | ICD-10-CM | POA: Insufficient documentation

## 2011-07-26 DIAGNOSIS — N952 Postmenopausal atrophic vaginitis: Secondary | ICD-10-CM

## 2011-07-26 DIAGNOSIS — N951 Menopausal and female climacteric states: Secondary | ICD-10-CM

## 2011-07-26 DIAGNOSIS — Z780 Asymptomatic menopausal state: Secondary | ICD-10-CM

## 2011-07-26 DIAGNOSIS — N871 Moderate cervical dysplasia: Secondary | ICD-10-CM

## 2011-07-26 NOTE — Progress Notes (Signed)
Subjective:     Patient ID: [name], female   DOB: 24-Jul-1941, 70 y.o.   MRN: [id number]  HPIpatient came to see me today for further followup. The first thing we discussed is her osteoporosis. By bone density she's always had osteopenia but she has had 2 stress fractures without any trauma at all. One was severe. One was while she was on steroids. She did Fosamax for 3 years and then we switched her to IV Reclast which she is now done for 3 more years. She has had no further fractures. She takes calcium and vitamin D. She does have vaginal dryness but doesn't feel the need for medication. She is having no vaginal bleeding. She is having no pelvic pain. 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 She has been off HRT for over 6 years and her menopausal symptoms continue to resolve   Review of Systems  HENT: Negative for ear discharge.        Ringing in the ears  Eyes: Negative.   Respiratory: Negative.   Cardiovascular:       Hyperlipidemia. Hypertension. Mitral valve prolapse. Tricuspid regurgitation.  Gastrointestinal:       Colon polyps  Genitourinary: Negative.   Musculoskeletal:       Osteoarthritis  Skin:       Skin cancer  Neurological: Positive for syncope.  Hematological: Negative.   Psychiatric/Behavioral: Negative.        Objective:   Physical ExamPhysical examination: [name] present HEENT within normal limits. Neck: Thyroid not large. No masses. Supraclavicular nodes: not enlarged. Breasts: Examined in both sitting midline position. No skin changes and no masses. Abdomen: Soft no guarding rebound or masses or hernia. Pelvic: External: Within normal limits. BUS: Within normal limits. Vaginal:within normal limits. Poor estrogen effect. No evidence of cystocele rectocele or enterocele. Cervix: clean. Uterus: Normal size and shape. Adnexa: No masses. Rectovaginal exam: Confirmatory and negative. Extremities: Within normal  limits.     Assessment:     #1. Osteoporosis #2. Atrophic vaginitis #3. CIN #4. Menopausal symptoms    Plan:    the patient and I had a very long discussion about whether she should continue Reclast. I qouterd her the most recent study on six-year treatment. We discussed a typical fractures. I've explained that I do not think that there's a perfect response at this point. I think with her 6 years of total treatment, 3 years orally in 3 years of Reclast she's probably fully protected. We will see what her bone densities shows and then make a decision. We discussed a second opinion and I thought [name] in [place] would be a good choice. She will get her mammogram. Will not treat her menopausal symptoms.

## 2011-08-03 ENCOUNTER — Encounter: Payer: Self-pay | Admitting: Obstetrics and Gynecology

## 2011-08-08 ENCOUNTER — Other Ambulatory Visit: Payer: Self-pay | Admitting: Obstetrics and Gynecology

## 2011-08-08 DIAGNOSIS — M810 Age-related osteoporosis without current pathological fracture: Secondary | ICD-10-CM

## 2011-08-08 DIAGNOSIS — N6489 Other specified disorders of breast: Secondary | ICD-10-CM | POA: Diagnosis not present

## 2011-08-13 ENCOUNTER — Other Ambulatory Visit: Payer: Self-pay | Admitting: *Deleted

## 2011-08-13 DIAGNOSIS — N6459 Other signs and symptoms in breast: Secondary | ICD-10-CM

## 2011-08-16 ENCOUNTER — Other Ambulatory Visit (INDEPENDENT_AMBULATORY_CARE_PROVIDER_SITE_OTHER): Payer: Medicare Other

## 2011-08-16 DIAGNOSIS — R5383 Other fatigue: Secondary | ICD-10-CM

## 2011-08-16 DIAGNOSIS — I10 Essential (primary) hypertension: Secondary | ICD-10-CM

## 2011-08-16 DIAGNOSIS — E7800 Pure hypercholesterolemia, unspecified: Secondary | ICD-10-CM

## 2011-08-16 DIAGNOSIS — R5381 Other malaise: Secondary | ICD-10-CM

## 2011-08-16 DIAGNOSIS — E876 Hypokalemia: Secondary | ICD-10-CM

## 2011-08-16 DIAGNOSIS — I059 Rheumatic mitral valve disease, unspecified: Secondary | ICD-10-CM | POA: Diagnosis not present

## 2011-08-16 DIAGNOSIS — I341 Nonrheumatic mitral (valve) prolapse: Secondary | ICD-10-CM

## 2011-08-16 LAB — BASIC METABOLIC PANEL
BUN: 16 mg/dL (ref 6–23)
CO2: 24 mEq/L (ref 19–32)
Calcium: 8.7 mg/dL (ref 8.4–10.5)
Chloride: 108 mEq/L (ref 96–112)
Creatinine, Ser: 0.6 mg/dL (ref 0.4–1.2)
GFR: 98.69 mL/min (ref >60.00)
Glucose, Bld: 96 mg/dL (ref 70–99)
Potassium: 4.2 mEq/L (ref 3.5–5.1)
Sodium: 142 mEq/L (ref 135–145)

## 2011-08-16 LAB — CBC WITH DIFFERENTIAL/PLATELET
Basophils Absolute: 0 10*3/uL (ref 0.0–0.1)
Basophils Relative: 0.6 % (ref 0.0–3.0)
Eosinophils Absolute: 0.1 10*3/uL (ref 0.0–0.7)
Eosinophils Relative: 2.5 % (ref 0.0–5.0)
HCT: 39.2 % (ref 36.0–46.0)
Hemoglobin: 13.2 g/dL (ref 12.0–15.0)
Lymphocytes Relative: 42.5 % (ref 12.0–46.0)
Lymphs Abs: 2.4 10*3/uL (ref 0.7–4.0)
MCHC: 33.7 g/dL (ref 30.0–36.0)
MCV: 89.9 fl (ref 78.0–100.0)
Monocytes Absolute: 0.5 10*3/uL (ref 0.1–1.0)
Monocytes Relative: 8.6 % (ref 3.0–12.0)
Neutro Abs: 2.6 10*3/uL (ref 1.4–7.7)
Neutrophils Relative %: 45.8 % (ref 43.0–77.0)
Platelets: 231 10*3/uL (ref 150.0–400.0)
RBC: 4.35 Mil/uL (ref 3.87–5.11)
RDW: 12.9 % (ref 11.5–14.6)
WBC: 5.6 10*3/uL (ref 4.5–10.5)

## 2011-08-16 LAB — TSH: TSH: 0.69 u[IU]/mL (ref 0.35–5.50)

## 2011-08-16 LAB — HEPATIC FUNCTION PANEL
ALT: 21 U/L (ref 0–35)
AST: 26 U/L (ref 0–37)
Albumin: 3.9 g/dL (ref 3.5–5.2)
Alkaline Phosphatase: 73 U/L (ref 39–117)
Bilirubin, Direct: 0.1 mg/dL (ref 0.0–0.3)
Total Bilirubin: 0.6 mg/dL (ref 0.3–1.2)
Total Protein: 6.3 g/dL (ref 6.0–8.3)

## 2011-08-16 LAB — LIPID PANEL
Cholesterol: 149 mg/dL (ref 0–200)
HDL: 74.1 mg/dL (ref >39.00)
LDL Cholesterol: 67 mg/dL (ref 0–99)
Total CHOL/HDL Ratio: 2
Triglycerides: 41 mg/dL (ref 0.0–149.0)
VLDL: 8.2 mg/dL (ref 0.0–40.0)

## 2011-08-20 ENCOUNTER — Telehealth: Payer: Self-pay | Admitting: Cardiology

## 2011-08-20 NOTE — Telephone Encounter (Signed)
I talked with pt about recent lab results 

## 2011-08-20 NOTE — Telephone Encounter (Signed)
Fu call °Patient returning your call °

## 2011-08-21 ENCOUNTER — Encounter: Payer: Self-pay | Admitting: Internal Medicine

## 2011-08-23 DIAGNOSIS — M1710 Unilateral primary osteoarthritis, unspecified knee: Secondary | ICD-10-CM | POA: Diagnosis not present

## 2011-08-28 ENCOUNTER — Telehealth: Payer: Self-pay | Admitting: *Deleted

## 2011-08-28 NOTE — Telephone Encounter (Signed)
PT CALLING WANTING DEXA RESULTS FROM LAST BONE DENSITY.

## 2011-08-28 NOTE — Telephone Encounter (Signed)
Tell patient bone density was stable. She continues to have osteopenia. As we discussed in December I think it's reasonable not to treat her again based on her previous treatment. We have also discussed her getting a second opinion in [place] from someone I know. If she is interested we can arrange it.

## 2011-08-28 NOTE — Telephone Encounter (Signed)
LM FOR PT CALL

## 2011-08-31 NOTE — Telephone Encounter (Signed)
Pt informed with the below note, she will contact the Dr. Adah Salvage in [place] for second opinion,pt will call back if referral needed.

## 2011-09-14 ENCOUNTER — Encounter: Payer: Self-pay | Admitting: Cardiology

## 2011-09-14 ENCOUNTER — Ambulatory Visit (INDEPENDENT_AMBULATORY_CARE_PROVIDER_SITE_OTHER): Payer: Medicare Other | Admitting: Cardiology

## 2011-09-14 DIAGNOSIS — I059 Rheumatic mitral valve disease, unspecified: Secondary | ICD-10-CM | POA: Diagnosis not present

## 2011-09-14 DIAGNOSIS — R55 Syncope and collapse: Secondary | ICD-10-CM

## 2011-09-14 DIAGNOSIS — I10 Essential (primary) hypertension: Secondary | ICD-10-CM | POA: Diagnosis not present

## 2011-09-14 DIAGNOSIS — I341 Nonrheumatic mitral (valve) prolapse: Secondary | ICD-10-CM

## 2011-09-14 MED ORDER — LOSARTAN POTASSIUM 50 MG PO TABS: 50.0000 mg | ORAL_TABLET | Freq: Every day | ORAL | Status: DC

## 2011-09-14 MED ORDER — ENALAPRIL MALEATE 20 MG PO TABS: 20.0000 mg | ORAL_TABLET | Freq: Two times a day (BID) | ORAL | Status: DC

## 2011-09-14 MED ORDER — ATORVASTATIN CALCIUM 10 MG PO TABS: 10.0000 mg | ORAL_TABLET | Freq: Every day | ORAL | Status: DC

## 2011-09-14 MED ORDER — AMLODIPINE BESYLATE 5 MG PO TABS: 5.0000 mg | ORAL_TABLET | Freq: Every day | ORAL | Status: DC

## 2011-09-14 MED ORDER — POTASSIUM CHLORIDE CRYS ER 20 MEQ PO TBCR: EXTENDED_RELEASE_TABLET | ORAL | Status: DC

## 2011-09-14 MED ORDER — CARVEDILOL 12.5 MG PO TABS: 18.7500 mg | ORAL_TABLET | Freq: Two times a day (BID) | ORAL | Status: DC

## 2011-09-14 MED ORDER — FUROSEMIDE 20 MG PO TABS: 20.0000 mg | ORAL_TABLET | Freq: Every day | ORAL | Status: DC

## 2011-09-14 NOTE — Assessment & Plan Note (Signed)
We'll the patient has only mild mitral valve regurgitation. No further workup is needed at this time.

## 2011-09-14 NOTE — Assessment & Plan Note (Signed)
There is been no recurrent syncope. No further workup. 

## 2011-09-14 NOTE — Assessment & Plan Note (Signed)
Blood pressure is well controlled. No change in therapy. 

## 2011-09-14 NOTE — Progress Notes (Signed)
HPI Patient is here for cardiology followup. She's doing very well. Her blood pressures control. She has excellent lipids. She's not having chest pain or shortness of breath. She is having difficulties with one of her knees. She may need a procedure in the future. Allergies  Allergen Reactions  . Codeine     Some sensitivity    Current Outpatient Prescriptions  Medication Sig Dispense Refill  . amitriptyline (ELAVIL) 25 MG tablet 1-2 by mouth daily       . amLODipine (NORVASC) 5 MG tablet Take 1 tablet (5 mg total) by mouth daily.  90 tablet  3  . Ascorbic Acid (VITAMIN C) 1000 MG tablet Take 1,000 mg by mouth daily.        Marland Kitchen aspirin 81 MG chewable tablet Chew 81 mg by mouth daily.        Marland Kitchen atorvastatin (LIPITOR) 10 MG tablet Take 1 tablet (10 mg total) by mouth daily.  30 tablet  10  . Biotin (BIOTIN FORTE) 5 MG TABS Take by mouth.        . Calcium Carbonate-Vitamin D (CALTRATE 600+D) 600-400 MG-UNIT per tablet Take 1 tablet by mouth 2 (two) times daily.       . carvedilol (COREG) 12.5 MG tablet TAKE 1 AND 1/2 TABLETS TWICE DAILY  270 tablet  1  . doxycycline (DORYX) 75 MG EC tablet Take 75 mg by mouth 3 (three) times a week.        . enalapril (VASOTEC) 20 MG tablet Take 1 tablet (20 mg total) by mouth 2 (two) times daily.  60 tablet  11  . Evening Primrose Oil CAPS Take by mouth 2 (two) times daily.        . furosemide (LASIX) 20 MG tablet Take 1 tablet (20 mg total) by mouth daily.  30 tablet  6  . Glucosamine-Chondroitin 750-600 MG TABS Take by mouth.        . losartan (COZAAR) 50 MG tablet Take 1 tablet (50 mg total) by mouth daily.  90 tablet  3  . meloxicam (MOBIC) 15 MG tablet Take 15 mg by mouth daily.       . mometasone (NASONEX) 50 MCG/ACT nasal spray 2 sprays by Nasal route daily.  17 g  2  . potassium chloride SA (K-DUR,KLOR-CON) 20 MEQ tablet Take 3 tabs in the AM and 2 tabs in the PM  450 tablet  10  . vitamin A 62952 UNIT capsule Once daily for 3 mths then off for 1 mth  and repeat       . zoledronic acid (RECLAST) 5 MG/100ML SOLN Inject into the vein. yearly         History   Social History  . Marital Status: Married    Spouse Name: N/A    Number of Children: 1  . Years of Education: N/A   Occupational History  . Homemaker    Social History Main Topics  . Smoking status: Never Smoker   . Smokeless tobacco: Never Used  . Alcohol Use: No  . Drug Use: No  . Sexually Active: No   Other Topics Concern  . Not on file   Social History Narrative   3 caffeine drinks daily MarriedRetired    Family History  Problem Relation Age of Onset  . Heart attack Mother 40  . Hypertension Mother   . Heart disease Mother   . Stroke Mother   . Heart attack Father 64  . Coronary artery disease Father  Multiple Family Members  . Hypertension Father   . Heart disease Father   . Esophageal cancer Paternal Grandmother   . Colon cancer Maternal Grandfather   . Heart disease Maternal Grandfather   . Irritable bowel syndrome Sister     More family members on father side of   . Hypertension Sister   . Heart disease Sister   . Breast cancer Paternal Aunt   . Heart disease Paternal Grandfather     Past Medical History  Diagnosis Date  . Dyslipidemia   . Polymyalgia rheumatica   . HTN (hypertension)   . Mitral valve prolapse     echo, January, 2011, moderate prolapse with your leaflet, trivial MR   Antibiotic required for procedures  . Tricuspid regurgitation     mild to moderate, echo, January, 2011, PA pressure 38 mm mercury  . 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 Hyponatremia   . History of skin cancer   . Rheumatoid arthritis   . Chronic cystitis   . Hemorrhoids   . Hx of colonoscopy   . IBS (irritable bowel syndrome)   . History of colon polyps   . Ejection fraction     EF 65%, echo, 2011  . Potassium (K) deficiency     5 potassium requirement over time  . Syncope     August, 200 weight,  dehydration  . 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 S/P knee surgery     clearance done January, 2012  . CIN I (cervical intraepithelial neoplasia I)   . Arthritis   . Osteoporosis     Past Surgical History  Procedure Date  . Excisional hemorrhoidectomy 2003  . Mohs surgery 2011  . Knee surgery 2012    Left   . Colonoscopy 12/23/2003    normal (indications: prior adenomas and grandfather with colon cancer)  . Colposcopy   . Tonsillectomy     ROS  Patient denies fever, chills, headache, sweats, rash, change in vision, change in hearing, chest pain, cough, nausea vomiting, urinary symptoms. All of the systems are reviewed and are negative other than the history of present illness.  PHYSICAL EXAM Patient is oriented to person time and place. Affect is normal. Head is atraumatic. There is no jugulovenous distention. Lungs are clear. Respiratory effort is nonlabored. Cardiac exam reveals S1 and S2. There no clicks or significant murmurs. Abdomen is soft. Is no peripheral edema. Filed Vitals:   09/14/11 1438  BP: 118/70  Pulse: 56  Height: 5\' 5"  (1.651 m)  Weight: 164 lb (74.39 kg)   EKG is done today and reviewed by me. It is normal.  ASSESSMENT & PLAN

## 2011-09-14 NOTE — Patient Instructions (Signed)
Your physician wants you to follow-up in:  6 months. You will receive a reminder letter in the mail two months in advance. If you don't receive a letter, please call our office to schedule the follow-up appointment.   

## 2011-09-25 DIAGNOSIS — M1710 Unilateral primary osteoarthritis, unspecified knee: Secondary | ICD-10-CM | POA: Diagnosis not present

## 2011-10-02 DIAGNOSIS — IMO0002 Reserved for concepts with insufficient information to code with codable children: Secondary | ICD-10-CM | POA: Diagnosis not present

## 2011-10-04 DIAGNOSIS — M1710 Unilateral primary osteoarthritis, unspecified knee: Secondary | ICD-10-CM | POA: Diagnosis not present

## 2011-10-05 DIAGNOSIS — M1710 Unilateral primary osteoarthritis, unspecified knee: Secondary | ICD-10-CM | POA: Diagnosis not present

## 2011-10-08 DIAGNOSIS — M1710 Unilateral primary osteoarthritis, unspecified knee: Secondary | ICD-10-CM | POA: Diagnosis not present

## 2011-10-10 DIAGNOSIS — IMO0002 Reserved for concepts with insufficient information to code with codable children: Secondary | ICD-10-CM | POA: Diagnosis not present

## 2011-10-11 DIAGNOSIS — M1710 Unilateral primary osteoarthritis, unspecified knee: Secondary | ICD-10-CM | POA: Diagnosis not present

## 2011-10-12 DIAGNOSIS — L719 Rosacea, unspecified: Secondary | ICD-10-CM | POA: Diagnosis not present

## 2011-10-15 DIAGNOSIS — M1710 Unilateral primary osteoarthritis, unspecified knee: Secondary | ICD-10-CM | POA: Diagnosis not present

## 2011-10-17 DIAGNOSIS — M1710 Unilateral primary osteoarthritis, unspecified knee: Secondary | ICD-10-CM | POA: Diagnosis not present

## 2011-10-18 ENCOUNTER — Ambulatory Visit (INDEPENDENT_AMBULATORY_CARE_PROVIDER_SITE_OTHER): Payer: Medicare Other | Admitting: Obstetrics and Gynecology

## 2011-10-18 DIAGNOSIS — M899 Disorder of bone, unspecified: Secondary | ICD-10-CM

## 2011-10-18 DIAGNOSIS — M949 Disorder of cartilage, unspecified: Secondary | ICD-10-CM | POA: Diagnosis not present

## 2011-10-18 DIAGNOSIS — M8580 Other specified disorders of bone density and structure, unspecified site: Secondary | ICD-10-CM

## 2011-10-18 NOTE — Progress Notes (Signed)
Patient came to see me today to discuss whether she should have further treatment for bone loss. Initially patient had used Fosamax but then had switched to generic Fosamax. On generic Fosamax she had lost bone. She also suffered a stress fracture. We switched her to IV Reclast which she has done for 3 years. If she continues it she would get her fourth infusion this month. In December of 2012 she went back for followup bone density. Her spine was better however she has both scoliosis and degenerative changes. Her hip was stable but not improved. Her worst T score is -2.3.  We had a long discussion today of weather we should  continue treatment. We discussed the data published showing 6 years of Reclast with better results in 3 years. We discussed drug holiday. We discussed going back to oral medication. I favored monthly Actonel. We also discussed secondary causes of bone loss although no tests were done today. We decided to send her to Dr. Lyndee Hensen in [place] for a second opinion. She will call for an appointment. We will make available her bone densities to him. Total time of interview was 30 minutes.

## 2011-11-27 DIAGNOSIS — M1710 Unilateral primary osteoarthritis, unspecified knee: Secondary | ICD-10-CM | POA: Diagnosis not present

## 2011-11-30 DIAGNOSIS — H40019 Open angle with borderline findings, low risk, unspecified eye: Secondary | ICD-10-CM | POA: Diagnosis not present

## 2011-11-30 DIAGNOSIS — H2510 Age-related nuclear cataract, unspecified eye: Secondary | ICD-10-CM | POA: Diagnosis not present

## 2011-11-30 DIAGNOSIS — H52209 Unspecified astigmatism, unspecified eye: Secondary | ICD-10-CM | POA: Diagnosis not present

## 2011-12-02 ENCOUNTER — Encounter: Payer: Self-pay | Admitting: Internal Medicine

## 2011-12-02 ENCOUNTER — Other Ambulatory Visit: Payer: Self-pay | Admitting: Orthopedic Surgery

## 2011-12-02 MED ORDER — DEXAMETHASONE SODIUM PHOSPHATE 10 MG/ML IJ SOLN: 10.0000 mg | Freq: Once | INTRAMUSCULAR | Status: DC

## 2011-12-02 MED ORDER — BUPIVACAINE 0.25 % ON-Q PUMP SINGLE CATH 300ML: 300.0000 mL | INJECTION | Status: DC

## 2011-12-03 DIAGNOSIS — Z0279 Encounter for issue of other medical certificate: Secondary | ICD-10-CM

## 2011-12-14 DIAGNOSIS — M810 Age-related osteoporosis without current pathological fracture: Secondary | ICD-10-CM | POA: Diagnosis not present

## 2011-12-25 ENCOUNTER — Telehealth: Payer: Self-pay | Admitting: *Deleted

## 2011-12-25 NOTE — Telephone Encounter (Signed)
thanks

## 2011-12-25 NOTE — Telephone Encounter (Signed)
FYI: Pt states that Dr Adah Salvage recommends another Reclast infusion. I will schedule labs and then appt. KW

## 2011-12-26 NOTE — Telephone Encounter (Signed)
Addended by: Richardson Chiquito on: 12/26/2011 03:06 PM   Modules accepted: Orders

## 2011-12-26 NOTE — Telephone Encounter (Signed)
Pt informed that Medicare does pay for the Reclast and she wants to proceed. She needs a calcium and a creatinine drawn prior. She will come tomorrow for them and would like a Thursday am for the infusion appt. i will call her when i get the results back and the appt scheduled.

## 2011-12-27 ENCOUNTER — Other Ambulatory Visit: Payer: Medicare Other

## 2011-12-27 DIAGNOSIS — M810 Age-related osteoporosis without current pathological fracture: Secondary | ICD-10-CM | POA: Diagnosis not present

## 2011-12-28 LAB — CREATININE, SERUM: Creat: 0.69 mg/dL (ref 0.50–1.10)

## 2011-12-28 LAB — CALCIUM: Calcium: 9.4 mg/dL (ref 8.4–10.5)

## 2012-01-01 NOTE — Telephone Encounter (Signed)
Patient's labs were normal and scheduled Reclast for 01/11/12 @ 11am. Order faxed and information sheet mailed to patient.

## 2012-01-09 ENCOUNTER — Other Ambulatory Visit (HOSPITAL_COMMUNITY): Payer: Self-pay | Admitting: *Deleted

## 2012-01-10 ENCOUNTER — Ambulatory Visit (HOSPITAL_COMMUNITY)
Admission: RE | Admit: 2012-01-10 | Discharge: 2012-01-10 | Disposition: A | Discharge status: Home / Self Care | Payer: Medicare Other | Source: Ambulatory Visit | Attending: Obstetrics and Gynecology | Admitting: Obstetrics and Gynecology

## 2012-01-10 DIAGNOSIS — M810 Age-related osteoporosis without current pathological fracture: Secondary | ICD-10-CM | POA: Insufficient documentation

## 2012-01-10 MED ORDER — ZOLEDRONIC ACID 5 MG/100ML IV SOLN
5.0000 mg | Freq: Once | INTRAVENOUS | Status: AC
  Administered 2012-01-10: 5 mg via INTRAVENOUS
  Filled 2012-01-10: qty 100

## 2012-01-11 ENCOUNTER — Encounter (HOSPITAL_COMMUNITY): Payer: Medicare Other

## 2012-01-18 DIAGNOSIS — L719 Rosacea, unspecified: Secondary | ICD-10-CM | POA: Diagnosis not present

## 2012-02-12 ENCOUNTER — Encounter (HOSPITAL_COMMUNITY): Payer: Self-pay | Admitting: Pharmacy Technician

## 2012-02-13 ENCOUNTER — Encounter: Payer: Self-pay | Admitting: Internal Medicine

## 2012-02-13 ENCOUNTER — Other Ambulatory Visit: Payer: Self-pay | Admitting: Cardiology

## 2012-02-13 ENCOUNTER — Ambulatory Visit (INDEPENDENT_AMBULATORY_CARE_PROVIDER_SITE_OTHER): Payer: Medicare Other | Admitting: Internal Medicine

## 2012-02-13 VITALS — BP 116/84 | HR 76 | Temp 98.0°F | Wt 164.0 lb

## 2012-02-13 DIAGNOSIS — I059 Rheumatic mitral valve disease, unspecified: Secondary | ICD-10-CM | POA: Diagnosis not present

## 2012-02-13 DIAGNOSIS — Z01818 Encounter for other preprocedural examination: Secondary | ICD-10-CM

## 2012-02-13 DIAGNOSIS — M810 Age-related osteoporosis without current pathological fracture: Secondary | ICD-10-CM | POA: Diagnosis not present

## 2012-02-13 DIAGNOSIS — I341 Nonrheumatic mitral (valve) prolapse: Secondary | ICD-10-CM

## 2012-02-13 DIAGNOSIS — E785 Hyperlipidemia, unspecified: Secondary | ICD-10-CM

## 2012-02-13 DIAGNOSIS — I10 Essential (primary) hypertension: Secondary | ICD-10-CM

## 2012-02-13 DIAGNOSIS — M1710 Unilateral primary osteoarthritis, unspecified knee: Secondary | ICD-10-CM

## 2012-02-13 NOTE — Progress Notes (Signed)
Subjective:    Patient ID: [name], female    DOB: 05/09/1941, 71 y.o.   MRN: [id number]  HPI Pt comes in for pre op clearance for right TKR to be done  July 22 by Dr Despina Hick Since her last visit she has done well .  To rehab at home daughter to help. BP ok  Cards check with Dr Myrtis Ser in the spring .has mild MVP and mr prob hemodynamically insignificant at this time.  New opthalmologist.  Inc IOP   Family hx  of glaucoma and  On watch.  No unusual  bruising or bleeding . Had osteoporosis eval per Dr Mal Misty  Had Vit d 32 add vit d 1000 iu per day goal 40- 90 dexa -2.4 but had mild deformity on vertebral assessment  Felt to ve osteoporosis  t 12 .  advise reclast for 5-6 years total. No cp sob fever  .  Review of Systems no fever cp sob edema syncope   ocass  Right hip pain limping from knee pain. No current cough. Change in Gi habits.  Past history family history social history reviewed in the electronic medical record. Outpatient Encounter Prescriptions as of 02/13/2012  Medication Sig Dispense Refill  . amitriptyline (ELAVIL) 25 MG tablet Take 25-50 mg by mouth at bedtime.       Marland Kitchen amLODipine (NORVASC) 5 MG tablet Take 5 mg by mouth daily with breakfast.      . aspirin 81 MG chewable tablet Chew 81 mg by mouth See admin instructions. Monday thru Friday only      . atorvastatin (LIPITOR) 10 MG tablet Take 10 mg by mouth daily with breakfast.      . Biotin 5000 MCG CAPS Take 5,000 mcg by mouth daily with breakfast.      . Calcium Carbonate-Vitamin D (CALTRATE 600+D) 600-400 MG-UNIT per tablet Take 1 tablet by mouth 2 (two) times daily.       . carvedilol (COREG) 12.5 MG tablet Take 1.5 tablets (18.75 mg total) by mouth 2 (two) times daily with a meal.  270 tablet  1  . cholecalciferol (VITAMIN D) 1000 UNITS tablet Take 1,000 Units by mouth daily with breakfast.      . doxycycline (DORYX) 75 MG EC tablet Take 75 mg by mouth 3 (three) times a week. Monday Wednesday and Friday       . enalapril (VASOTEC) 20 MG tablet Take 1 tablet (20 mg total) by mouth 2 (two) times daily.  180 tablet  3  . Evening Primrose Oil CAPS Take 1 capsule by mouth 2 (two) times daily.       . furosemide (LASIX) 20 MG tablet Take 20 mg by mouth daily with breakfast.      . losartan (COZAAR) 50 MG tablet Take 50 mg by mouth daily with breakfast.      . meloxicam (MOBIC) 15 MG tablet Take 15 mg by mouth daily with breakfast.       . potassium chloride SA (K-DUR,KLOR-CON) 20 MEQ tablet Take 20-40 mEq by mouth 3 (three) times daily. Patient takes 2 tablets every morning and 2 at lunch and 1 tablet at dinner      . vitamin A 34742 UNIT capsule Once daily for 3 mths then off for 1 mth and repeat       . zoledronic acid (RECLAST) 5 MG/100ML SOLN Inject into the vein. yearly       . DISCONTD: mometasone (NASONEX) 50 MCG/ACT nasal spray 2 sprays  by Nasal route daily.  17 g  2       Objective:   Physical Exam BP 116/84  Pulse 76  Temp 98 F (36.7 C) (Oral)  Wt 164 lb (74.39 kg)  SpO2 99%  Physical Exam: Vital signs reviewed WUJ:WJXB is a well-developed well-nourished alert cooperative  white female who appears her stated age in no acute distress.  HEENT: normocephalic atraumatic , Eyes: PERRL EOM's full, conjunctiva clear, Nares: paten,t no deformity discharge or tenderness., Ears: no deformity EAC's clear TMs with normal landmarks. Mouth: clear OP, no lesions, edema.  Moist mucous membranes. Dentition in adequate repair. NECK: supple without masses, thyromegaly or bruits. CHEST/PULM:  Clear to auscultation and percussion breath sounds equal no wheeze , rales or rhonchi. No chest wall deformities or tenderness. CV: PMI is nondisplaced, S1 S2 no gallops,, rubs. Peripheral pulses are full without delay.No JVD .  ABDOMEN: Bowel sounds normal nontender  No guard or rebound, no hepato splenomegal no CVA tenderness.  Extremtities:  No clubbing cyanosis or edema, no acute joint swelling or redness no  focal atrophy NEURO:  Oriented x3, cranial nerves 3-12 appear to be intact, no obvious focal weakness,gait steady slgitly antalgic  SKIN: No acute rashes normal turgor, color, no bruising or petechiae. PSYCH: Oriented, good eye contact, no obvious depression anxiety, cognition and judgment appear normal. LN: no cervical  adenopathy Record review     Assessment & Plan:   Pre operative . Evaluation. tkr for DJD knee No CI and acceptable risk  for surgery. Cleared from medical situation.  Will send note to ortho office. To stop asa and mobic pre surgery.   HT stable  Ostoporosis on reclast MVP mild and mild tr  Nl EF Hx of PMR remitted

## 2012-02-13 NOTE — Patient Instructions (Signed)
Will send a copy or note to Dr Despina Hick office  . Clearance  For surgery.

## 2012-02-18 ENCOUNTER — Ambulatory Visit (HOSPITAL_COMMUNITY)
Admission: RE | Admit: 2012-02-18 | Discharge: 2012-02-18 | Disposition: A | Discharge status: Home / Self Care | Payer: Medicare Other | Source: Ambulatory Visit | Attending: Orthopedic Surgery | Admitting: Orthopedic Surgery

## 2012-02-18 ENCOUNTER — Encounter (HOSPITAL_COMMUNITY): Payer: Self-pay

## 2012-02-18 ENCOUNTER — Encounter (HOSPITAL_COMMUNITY)
Admission: RE | Admit: 2012-02-18 | Discharge: 2012-02-18 | Disposition: A | Discharge status: Home / Self Care | Payer: Medicare Other | Source: Ambulatory Visit | Attending: Orthopedic Surgery | Admitting: Orthopedic Surgery

## 2012-02-18 DIAGNOSIS — Z01818 Encounter for other preprocedural examination: Secondary | ICD-10-CM | POA: Insufficient documentation

## 2012-02-18 DIAGNOSIS — Z01811 Encounter for preprocedural respiratory examination: Secondary | ICD-10-CM | POA: Diagnosis not present

## 2012-02-18 DIAGNOSIS — Z01812 Encounter for preprocedural laboratory examination: Secondary | ICD-10-CM | POA: Insufficient documentation

## 2012-02-18 HISTORY — DX: Cardiac murmur, unspecified: R01.1

## 2012-02-18 HISTORY — DX: Nausea with vomiting, unspecified: R11.2

## 2012-02-18 HISTORY — DX: Other specified postprocedural states: Z98.890

## 2012-02-18 LAB — URINALYSIS, ROUTINE W REFLEX MICROSCOPIC
Bilirubin Urine: NEGATIVE
Glucose, UA: NEGATIVE mg/dL
Hgb urine dipstick: NEGATIVE
Ketones, ur: NEGATIVE mg/dL
Leukocytes, UA: NEGATIVE
Nitrite: NEGATIVE
Protein, ur: NEGATIVE mg/dL
Specific Gravity, Urine: 1.01 (ref 1.005–1.030)
Urobilinogen, UA: 0.2 mg/dL (ref 0.0–1.0)
pH: 7 (ref 5.0–8.0)

## 2012-02-18 LAB — CBC
HCT: 41.8 % (ref 36.0–46.0)
Hemoglobin: 13.7 g/dL (ref 12.0–15.0)
MCH: 29.1 pg (ref 26.0–34.0)
MCHC: 32.8 g/dL (ref 30.0–36.0)
MCV: 88.7 fL (ref 78.0–100.0)
Platelets: 248 10*3/uL (ref 150–400)
RBC: 4.71 MIL/uL (ref 3.87–5.11)
RDW: 11.8 % (ref 11.5–15.5)
WBC: 7.7 10*3/uL (ref 4.0–10.5)

## 2012-02-18 LAB — COMPREHENSIVE METABOLIC PANEL
ALT: 15 U/L (ref 0–35)
AST: 20 U/L (ref 0–37)
Albumin: 3.9 g/dL (ref 3.5–5.2)
Alkaline Phosphatase: 68 U/L (ref 39–117)
BUN: 15 mg/dL (ref 6–23)
CO2: 26 mEq/L (ref 19–32)
Calcium: 9.4 mg/dL (ref 8.4–10.5)
Chloride: 104 mEq/L (ref 96–112)
Creatinine, Ser: 0.66 mg/dL (ref 0.50–1.10)
GFR calc Af Amer: >90 mL/min (ref >90)
GFR calc non Af Amer: 86 mL/min — ABNORMAL LOW (ref >90)
Glucose, Bld: 93 mg/dL (ref 70–99)
Potassium: 4.7 mEq/L (ref 3.5–5.1)
Sodium: 139 mEq/L (ref 135–145)
Total Bilirubin: 0.4 mg/dL (ref 0.3–1.2)
Total Protein: 6.7 g/dL (ref 6.0–8.3)

## 2012-02-18 LAB — PROTIME-INR
INR: 0.99 (ref 0.00–1.49)
Prothrombin Time: 13.3 seconds (ref 11.6–15.2)

## 2012-02-18 LAB — SURGICAL PCR SCREEN
MRSA, PCR: NEGATIVE
Staphylococcus aureus: NEGATIVE

## 2012-02-18 LAB — APTT: aPTT: 35 seconds (ref 24–37)

## 2012-02-18 NOTE — Pre-Procedure Instructions (Signed)
02/18/12 Carotid Duplex 2008 on chart  EKG 09/14/11 EKG on chart  LOV note with Dr Myrtis Ser on chart  Anesthesia Report from 08/30/10 on chart  Preop Eval 02/13/12 in [facility]

## 2012-02-18 NOTE — Patient Instructions (Signed)
20 [name]  02/18/2012   Your procedure is scheduled on:  02/25/12 0955am-1100am  Report to [facility] at 0730 AM.  Call this number if you have problems the morning of surgery: [phone]   Remember:   Do not eat food:After Midnight.  May have clear liquids:until Midnight .  Marland Kitchen  Take these medicines the morning of surgery with A SIP OF WATER:    Do not wear jewelry, make-up or nail polish.  Do not wear lotions, powders, or perfumes.   Do not shave 48 hours prior to surgery  Do not bring valuables to the hospital.  Contacts, dentures or bridgework may not be worn into surgery.  Leave suitcase in the car. After surgery it may be brought to your room.  For patients admitted to the hospital, checkout time is 11:00 AM the day of discharge.       Special Instructions: CHG Shower Use Special Wash: 1/2 bottle night before surgery and 1/2 bottle morning of surgery. Shower chin to toes with CHG.  Wash face and private parts with regular soap.    Please read over the following fact sheets that you were given: MRSA Information, Blood Transfusion Fact Sheet, coughing and deep breathing exerciseses, leg exercises

## 2012-02-21 DIAGNOSIS — M159 Polyosteoarthritis, unspecified: Secondary | ICD-10-CM | POA: Diagnosis not present

## 2012-02-21 DIAGNOSIS — M810 Age-related osteoporosis without current pathological fracture: Secondary | ICD-10-CM | POA: Diagnosis not present

## 2012-02-21 DIAGNOSIS — M353 Polymyalgia rheumatica: Secondary | ICD-10-CM | POA: Diagnosis not present

## 2012-02-24 ENCOUNTER — Other Ambulatory Visit: Payer: Self-pay | Admitting: Orthopedic Surgery

## 2012-02-24 NOTE — H&P (Signed)
[name]  DOB: July 18, 1941 Married / Language: English / Race: White / Female  Date of Admission:  02/25/2012  Chief Complaint:  Right Knee Pain  History of Present Illness The patient is a 71 year old female who comes in for a preoperative History and Physical. The patient is scheduled for a right total knee arthroplasty to be performed by Dr. Gus Rankin. Aluisio, MD at [facility] on 02/25/2012. The right knee continues to be very problematic for her. Not only is she having pain, she has a lot of dysfunction and difficulty doing activities. It is as bad as the left knee was prior to when we replaced that. She has done extremely well with the left knee replacement. Now she is ready to proceed with surgery. They have been treated conservatively in the past for the above stated problem and despite conservative measures, they continue to have progressive pain and severe functional limitations and dysfunction. They have failed non-operative management. It is felt that they would benefit from undergoing total joint replacement. Risks and benefits of the procedure have been discussed with the patient and they elect to proceed with surgery. There are no active contraindications to surgery such as ongoing infection or rapidly progressive neurological disease.  Allergies No Known Drug Allergies.  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 mother, father, sister, grandfather mothers side and grandfather fathers side Osteoarthritis. father and sister Cancer. grandmother fathers side Cerebrovascular Accident. mother Heart Disease. mother, father, grandfather mothers side and grandfather  fathers side Heart disease in female family member before age 38  Social History Pain Contract. no Tobacco use. never smoker Number of flights of stairs before winded. greater than 5 Alcohol use. never consumed alcohol Tobacco / smoke exposure. no Previously in rehab. no Drug/Alcohol Rehab (Currently). no Marital status. married Living situation. live with spouse Illicit drug use. no Current work status. retired Copywriter, advertising. 3 Exercise. Exercises weekly; does gym / weights  Medication History Enalapril Maleate (20MG  Tablet, Oral) Active. Potassium Bromide Specific dose unknown - Active. Norvasc ( Oral) Specific dose unknown - Active. Coreg CR ( Oral) Specific dose unknown - Active. Furosemide ( Oral) Specific dose unknown - Active.  Past Surgical History Total Knee Replacement. left Tonsillectomy Hemorrhoidectomy  Review of Systems General ROS: negative Respiratory ROS: no cough, shortness of breath, or wheezing Cardiovascular ROS: no chest pain or dyspnea on exertion Gastrointestinal ROS: no abdominal pain, change in bowel habits, or black or bloody stools Musculoskeletal ROS: positive for - joint pain and joint stiffness Neurological ROS: negative  Vitals Weight: 169 lb Height: 64 in Body Surface Area: 1.86 m Body Mass Index: 29.01 kg/m Pulse: 64 (Regular) Resp.: 12 (Unlabored) BP: 132/74 (Sitting, Left Arm, Standard)    Physical Exam The physical exam findings are as follows: Patient is a 71 year old female with continued right knee pain.   General Mental Status - Alert, cooperative and good historian. General Appearance- pleasant and Anxious. Not in acute distress. Orientation- Oriented X3. Build & Nutrition- Well nourished and Well developed.   Head and Neck Head- normocephalic, atraumatic . Neck Global Assessment- supple. no bruit auscultated on the right and no bruit auscultated on the left.   Eye Pupil-  Bilateral- Regular and Round. Note: glasses Motion- Bilateral- EOMI.   Chest  and Lung Exam Auscultation: Breath sounds:- clear at anterior chest wall and - clear at posterior chest wall. Adventitious sounds:- No Adventitious sounds.   Cardiovascular Auscultation:Rhythm- Regular rate and rhythm. Heart Sounds- S1 WNL and S2 WNL. Murmurs & Other Heart Sounds:Auscultation of the heart reveals - No Murmurs.   Abdomen Palpation/Percussion:Tenderness- Abdomen is non-tender to palpation. Rigidity (guarding)- Abdomen is soft. Auscultation:Auscultation of the abdomen reveals - Bowel sounds normal.   Female Genitourinary Not done, not pertinent to present illness  Musculoskeletal On exam, she is alert and oriented in no apparent distress. Evaluation of her right knee shows no effusion. There is slight varus deformity. Range is 5 to 120 with marked crepitus on range of motion. She is tender medial greater than lateral. There is no instability noted.  RADIOGRAPHS: Review of her x-rays shows that she has endstage arthritis of the right knee, bone on bone medial and patellofemoral with some tibial subluxation.  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 Lequita Halt.  PCP - Dr. Berniece Andreas  Signed electronically by Roberts Gaudy, PA-C

## 2012-02-25 ENCOUNTER — Ambulatory Visit (HOSPITAL_COMMUNITY): Payer: Medicare Other | Admitting: Anesthesiology

## 2012-02-25 ENCOUNTER — Encounter (HOSPITAL_COMMUNITY): Payer: Self-pay | Admitting: Anesthesiology

## 2012-02-25 ENCOUNTER — Encounter (HOSPITAL_COMMUNITY)
Admission: RE | Disposition: A | Discharge status: Home Health | Payer: Self-pay | Source: Ambulatory Visit | Attending: Orthopedic Surgery

## 2012-02-25 ENCOUNTER — Inpatient Hospital Stay (HOSPITAL_COMMUNITY)
Admission: RE | Admit: 2012-02-25 | Discharge: 2012-02-28 | DRG: 470 | Disposition: A | Discharge status: Home Health | Payer: Medicare Other | Source: Ambulatory Visit | Attending: Orthopedic Surgery | Admitting: Orthopedic Surgery

## 2012-02-25 ENCOUNTER — Encounter (HOSPITAL_COMMUNITY): Payer: Self-pay | Admitting: *Deleted

## 2012-02-25 DIAGNOSIS — I10 Essential (primary) hypertension: Secondary | ICD-10-CM | POA: Diagnosis not present

## 2012-02-25 DIAGNOSIS — E871 Hypo-osmolality and hyponatremia: Secondary | ICD-10-CM | POA: Diagnosis not present

## 2012-02-25 DIAGNOSIS — M069 Rheumatoid arthritis, unspecified: Secondary | ICD-10-CM | POA: Diagnosis not present

## 2012-02-25 DIAGNOSIS — M1710 Unilateral primary osteoarthritis, unspecified knee: Principal | ICD-10-CM | POA: Diagnosis present

## 2012-02-25 DIAGNOSIS — IMO0002 Reserved for concepts with insufficient information to code with codable children: Secondary | ICD-10-CM | POA: Diagnosis not present

## 2012-02-25 DIAGNOSIS — M25569 Pain in unspecified knee: Secondary | ICD-10-CM | POA: Diagnosis not present

## 2012-02-25 DIAGNOSIS — Z96659 Presence of unspecified artificial knee joint: Secondary | ICD-10-CM

## 2012-02-25 DIAGNOSIS — D069 Carcinoma in situ of cervix, unspecified: Secondary | ICD-10-CM | POA: Diagnosis not present

## 2012-02-25 HISTORY — PX: TOTAL KNEE ARTHROPLASTY: SHX125

## 2012-02-25 LAB — TYPE AND SCREEN
ABO/RH(D): A POS
Antibody Screen: NEGATIVE

## 2012-02-25 SURGERY — ARTHROPLASTY, KNEE, TOTAL
Anesthesia: Spinal | Site: Knee | Laterality: Right | Wound class: Clean

## 2012-02-25 MED ORDER — ACETAMINOPHEN 10 MG/ML IV SOLN
1000.0000 mg | Freq: Four times a day (QID) | INTRAVENOUS | Status: AC
  Administered 2012-02-25 – 2012-02-26 (×4): 1000 mg via INTRAVENOUS
  Filled 2012-02-25 (×7): qty 100

## 2012-02-25 MED ORDER — PROPOFOL 10 MG/ML IV BOLUS
INTRAVENOUS | Status: DC | PRN
  Administered 2012-02-25: 30 mg via INTRAVENOUS

## 2012-02-25 MED ORDER — ONDANSETRON HCL 4 MG PO TABS
4.0000 mg | ORAL_TABLET | Freq: Four times a day (QID) | ORAL | Status: DC | PRN
  Administered 2012-02-27: 4 mg via ORAL
  Filled 2012-02-25: qty 1

## 2012-02-25 MED ORDER — METOCLOPRAMIDE HCL 10 MG PO TABS: 5.0000 mg | ORAL_TABLET | Freq: Three times a day (TID) | ORAL | Status: DC | PRN

## 2012-02-25 MED ORDER — 0.9 % SODIUM CHLORIDE (POUR BTL) OPTIME
TOPICAL | Status: DC | PRN
  Administered 2012-02-25: 1000 mL

## 2012-02-25 MED ORDER — SODIUM CHLORIDE 0.9 % IV SOLN
INTRAVENOUS | Status: DC
  Administered 2012-02-26 (×2): via INTRAVENOUS

## 2012-02-25 MED ORDER — PROMETHAZINE HCL 25 MG/ML IJ SOLN: 6.2500 mg | INTRAMUSCULAR | Status: DC | PRN

## 2012-02-25 MED ORDER — MORPHINE SULFATE (PF) 1 MG/ML IV SOLN
INTRAVENOUS | Status: AC
  Administered 2012-02-26: 13 mg via INTRAVENOUS
  Filled 2012-02-25: qty 25

## 2012-02-25 MED ORDER — MORPHINE SULFATE (PF) 1 MG/ML IV SOLN
INTRAVENOUS | Status: DC
  Administered 2012-02-25: 21.49 mg via INTRAVENOUS
  Administered 2012-02-25: 12:00:00 via INTRAVENOUS
  Administered 2012-02-25: 2 mg via INTRAVENOUS
  Administered 2012-02-25: 20:00:00 via INTRAVENOUS
  Administered 2012-02-26: 21 mg via INTRAVENOUS
  Administered 2012-02-26: 01:00:00 via INTRAVENOUS
  Administered 2012-02-26: 4 mg via INTRAVENOUS
  Filled 2012-02-25 (×2): qty 25

## 2012-02-25 MED ORDER — DOCUSATE SODIUM 100 MG PO CAPS
100.0000 mg | ORAL_CAPSULE | Freq: Two times a day (BID) | ORAL | Status: DC
  Administered 2012-02-25 – 2012-02-28 (×7): 100 mg via ORAL

## 2012-02-25 MED ORDER — POTASSIUM CHLORIDE CRYS ER 20 MEQ PO TBCR
20.0000 meq | EXTENDED_RELEASE_TABLET | Freq: Every day | ORAL | Status: DC
  Administered 2012-02-25 – 2012-02-27 (×3): 20 meq via ORAL
  Filled 2012-02-25 (×4): qty 1

## 2012-02-25 MED ORDER — CEFAZOLIN SODIUM 1-5 GM-% IV SOLN
1.0000 g | Freq: Four times a day (QID) | INTRAVENOUS | Status: AC
  Administered 2012-02-25 (×2): 1 g via INTRAVENOUS
  Filled 2012-02-25 (×2): qty 50

## 2012-02-25 MED ORDER — RIVAROXABAN 10 MG PO TABS
10.0000 mg | ORAL_TABLET | Freq: Every day | ORAL | Status: DC
  Administered 2012-02-26 – 2012-02-28 (×3): 10 mg via ORAL
  Filled 2012-02-25 (×5): qty 1

## 2012-02-25 MED ORDER — PHENOL 1.4 % MT LIQD
1.0000 | OROMUCOSAL | Status: DC | PRN
  Filled 2012-02-25: qty 177

## 2012-02-25 MED ORDER — BISACODYL 10 MG RE SUPP: 10.0000 mg | Freq: Every day | RECTAL | Status: DC | PRN

## 2012-02-25 MED ORDER — FENTANYL CITRATE 0.05 MG/ML IJ SOLN
INTRAMUSCULAR | Status: DC | PRN
  Administered 2012-02-25: 50 ug via INTRAVENOUS

## 2012-02-25 MED ORDER — NALOXONE HCL 0.4 MG/ML IJ SOLN: 0.4000 mg | INTRAMUSCULAR | Status: DC | PRN

## 2012-02-25 MED ORDER — AMITRIPTYLINE HCL 25 MG PO TABS
25.0000 mg | ORAL_TABLET | Freq: Every day | ORAL | Status: DC
  Administered 2012-02-26 – 2012-02-27 (×2): 25 mg via ORAL
  Filled 2012-02-25 (×4): qty 1

## 2012-02-25 MED ORDER — DIPHENHYDRAMINE HCL 50 MG/ML IJ SOLN: 12.5000 mg | Freq: Four times a day (QID) | INTRAMUSCULAR | Status: DC | PRN

## 2012-02-25 MED ORDER — ACETAMINOPHEN 650 MG RE SUPP: 650.0000 mg | Freq: Four times a day (QID) | RECTAL | Status: DC | PRN

## 2012-02-25 MED ORDER — CHLORHEXIDINE GLUCONATE 4 % EX LIQD
60.0000 mL | Freq: Once | CUTANEOUS | Status: DC
  Filled 2012-02-25: qty 60

## 2012-02-25 MED ORDER — ENALAPRIL MALEATE 20 MG PO TABS
20.0000 mg | ORAL_TABLET | Freq: Two times a day (BID) | ORAL | Status: DC
  Administered 2012-02-25 – 2012-02-28 (×7): 20 mg via ORAL
  Filled 2012-02-25 (×8): qty 1

## 2012-02-25 MED ORDER — DEXTROSE 5 % IV SOLN
500.0000 mg | Freq: Four times a day (QID) | INTRAVENOUS | Status: DC | PRN
  Administered 2012-02-25: 500 mg via INTRAVENOUS
  Filled 2012-02-25: qty 5

## 2012-02-25 MED ORDER — TRAMADOL HCL 50 MG PO TABS
50.0000 mg | ORAL_TABLET | Freq: Four times a day (QID) | ORAL | Status: DC | PRN
  Administered 2012-02-27 – 2012-02-28 (×2): 100 mg via ORAL
  Filled 2012-02-25 (×2): qty 2

## 2012-02-25 MED ORDER — ACETAMINOPHEN 10 MG/ML IV SOLN
INTRAVENOUS | Status: AC
  Filled 2012-02-25: qty 100

## 2012-02-25 MED ORDER — CARVEDILOL 12.5 MG PO TABS
12.5000 mg | ORAL_TABLET | Freq: Every day | ORAL | Status: DC
  Filled 2012-02-25 (×2): qty 1

## 2012-02-25 MED ORDER — HYDROMORPHONE HCL PF 1 MG/ML IJ SOLN: 0.2500 mg | INTRAMUSCULAR | Status: DC | PRN

## 2012-02-25 MED ORDER — METHOCARBAMOL 500 MG PO TABS
500.0000 mg | ORAL_TABLET | Freq: Four times a day (QID) | ORAL | Status: DC | PRN
  Administered 2012-02-26 – 2012-02-28 (×7): 500 mg via ORAL
  Filled 2012-02-25 (×8): qty 1

## 2012-02-25 MED ORDER — DEXAMETHASONE SODIUM PHOSPHATE 10 MG/ML IJ SOLN
INTRAMUSCULAR | Status: DC | PRN
  Administered 2012-02-25: 10 mg via INTRAVENOUS

## 2012-02-25 MED ORDER — CEFAZOLIN SODIUM-DEXTROSE 2-3 GM-% IV SOLR
2.0000 g | INTRAVENOUS | Status: AC
  Administered 2012-02-25: 2 g via INTRAVENOUS

## 2012-02-25 MED ORDER — LOSARTAN POTASSIUM 50 MG PO TABS
50.0000 mg | ORAL_TABLET | Freq: Every day | ORAL | Status: DC
  Administered 2012-02-25 – 2012-02-28 (×4): 50 mg via ORAL
  Filled 2012-02-25 (×4): qty 1

## 2012-02-25 MED ORDER — BUPIVACAINE ON-Q PAIN PUMP (FOR ORDER SET NO CHG)
INJECTION | Status: DC
  Filled 2012-02-25: qty 1

## 2012-02-25 MED ORDER — SODIUM CHLORIDE 0.9 % IR SOLN
Status: DC | PRN
  Administered 2012-02-25: 1000 mL

## 2012-02-25 MED ORDER — ONDANSETRON HCL 4 MG/2ML IJ SOLN
4.0000 mg | Freq: Four times a day (QID) | INTRAMUSCULAR | Status: DC | PRN
  Filled 2012-02-25: qty 2

## 2012-02-25 MED ORDER — DIPHENHYDRAMINE HCL 12.5 MG/5ML PO ELIX: 12.5000 mg | ORAL_SOLUTION | ORAL | Status: DC | PRN

## 2012-02-25 MED ORDER — AMLODIPINE BESYLATE 5 MG PO TABS
5.0000 mg | ORAL_TABLET | Freq: Every day | ORAL | Status: DC
  Administered 2012-02-27 – 2012-02-28 (×2): 5 mg via ORAL
  Filled 2012-02-25 (×3): qty 1

## 2012-02-25 MED ORDER — CARVEDILOL 6.25 MG PO TABS
6.2500 mg | ORAL_TABLET | Freq: Every day | ORAL | Status: DC
  Filled 2012-02-25 (×2): qty 1

## 2012-02-25 MED ORDER — METOCLOPRAMIDE HCL 5 MG/ML IJ SOLN: 5.0000 mg | Freq: Three times a day (TID) | INTRAMUSCULAR | Status: DC | PRN

## 2012-02-25 MED ORDER — CEFAZOLIN SODIUM-DEXTROSE 2-3 GM-% IV SOLR
INTRAVENOUS | Status: AC
Start: 2012-02-25 — End: 2012-02-25
  Filled 2012-02-25: qty 50

## 2012-02-25 MED ORDER — ACETAMINOPHEN 10 MG/ML IV SOLN
1000.0000 mg | Freq: Once | INTRAVENOUS | Status: AC
  Administered 2012-02-25: 1000 mg via INTRAVENOUS
  Filled 2012-02-25: qty 100

## 2012-02-25 MED ORDER — ATORVASTATIN CALCIUM 10 MG PO TABS
10.0000 mg | ORAL_TABLET | Freq: Every day | ORAL | Status: DC
  Administered 2012-02-25 – 2012-02-26 (×2): 10 mg via ORAL
  Filled 2012-02-25 (×2): qty 1

## 2012-02-25 MED ORDER — FUROSEMIDE 20 MG PO TABS
20.0000 mg | ORAL_TABLET | Freq: Every day | ORAL | Status: DC
  Administered 2012-02-25 – 2012-02-27 (×3): 20 mg via ORAL
  Filled 2012-02-25 (×4): qty 1

## 2012-02-25 MED ORDER — SODIUM CHLORIDE 0.9 % IJ SOLN: 9.0000 mL | INTRAMUSCULAR | Status: DC | PRN

## 2012-02-25 MED ORDER — PROPOFOL 10 MG/ML IV EMUL
INTRAVENOUS | Status: DC | PRN
  Administered 2012-02-25: 300 ug/kg/min via INTRAVENOUS

## 2012-02-25 MED ORDER — DIPHENHYDRAMINE HCL 12.5 MG/5ML PO ELIX: 12.5000 mg | ORAL_SOLUTION | Freq: Four times a day (QID) | ORAL | Status: DC | PRN

## 2012-02-25 MED ORDER — ONDANSETRON HCL 4 MG/2ML IJ SOLN
4.0000 mg | Freq: Four times a day (QID) | INTRAMUSCULAR | Status: DC | PRN
  Administered 2012-02-26 (×2): 4 mg via INTRAVENOUS
  Filled 2012-02-25: qty 2

## 2012-02-25 MED ORDER — OXYCODONE HCL 5 MG PO TABS
5.0000 mg | ORAL_TABLET | ORAL | Status: DC | PRN
  Administered 2012-02-25: 5 mg via ORAL
  Administered 2012-02-26: 10 mg via ORAL
  Administered 2012-02-26 (×4): 5 mg via ORAL
  Administered 2012-02-26 – 2012-02-27 (×7): 10 mg via ORAL
  Administered 2012-02-27 – 2012-02-28 (×3): 5 mg via ORAL
  Administered 2012-02-28 (×2): 10 mg via ORAL
  Administered 2012-02-28: 5 mg via ORAL
  Filled 2012-02-25 (×2): qty 2
  Filled 2012-02-25: qty 1
  Filled 2012-02-25 (×2): qty 2
  Filled 2012-02-25: qty 1
  Filled 2012-02-25: qty 2
  Filled 2012-02-25 (×3): qty 1
  Filled 2012-02-25: qty 2
  Filled 2012-02-25 (×3): qty 1
  Filled 2012-02-25 (×3): qty 2
  Filled 2012-02-25: qty 1
  Filled 2012-02-25: qty 2
  Filled 2012-02-25: qty 1

## 2012-02-25 MED ORDER — SODIUM CHLORIDE 0.9 % IV SOLN
INTRAVENOUS | Status: DC
  Administered 2012-02-25: 13:00:00 via INTRAVENOUS

## 2012-02-25 MED ORDER — POTASSIUM CHLORIDE CRYS ER 20 MEQ PO TBCR
40.0000 meq | EXTENDED_RELEASE_TABLET | Freq: Two times a day (BID) | ORAL | Status: DC
  Administered 2012-02-26 – 2012-02-28 (×5): 40 meq via ORAL
  Filled 2012-02-25 (×7): qty 2

## 2012-02-25 MED ORDER — ACETAMINOPHEN 325 MG PO TABS
650.0000 mg | ORAL_TABLET | Freq: Four times a day (QID) | ORAL | Status: DC | PRN
  Administered 2012-02-26: 650 mg via ORAL
  Filled 2012-02-25: qty 2

## 2012-02-25 MED ORDER — LACTATED RINGERS IV SOLN
INTRAVENOUS | Status: DC | PRN
  Administered 2012-02-25 (×3): via INTRAVENOUS

## 2012-02-25 MED ORDER — MENTHOL 3 MG MT LOZG
1.0000 | LOZENGE | OROMUCOSAL | Status: DC | PRN
  Filled 2012-02-25: qty 9

## 2012-02-25 MED ORDER — CARVEDILOL 12.5 MG PO TABS
18.7500 mg | ORAL_TABLET | Freq: Two times a day (BID) | ORAL | Status: DC
  Administered 2012-02-26 – 2012-02-27 (×5): 18.75 mg via ORAL
  Filled 2012-02-25 (×8): qty 1

## 2012-02-25 MED ORDER — BUPIVACAINE 0.25 % ON-Q PUMP SINGLE CATH 300ML
INJECTION | Status: DC | PRN
  Administered 2012-02-25: 300 mL

## 2012-02-25 MED ORDER — FLEET ENEMA 7-19 GM/118ML RE ENEM: 1.0000 | ENEMA | Freq: Once | RECTAL | Status: AC | PRN

## 2012-02-25 MED ORDER — POTASSIUM CHLORIDE CRYS ER 20 MEQ PO TBCR: 20.0000 meq | EXTENDED_RELEASE_TABLET | Freq: Three times a day (TID) | ORAL | Status: DC

## 2012-02-25 MED ORDER — MIDAZOLAM HCL 5 MG/5ML IJ SOLN
INTRAMUSCULAR | Status: DC | PRN
  Administered 2012-02-25: 2 mg via INTRAVENOUS

## 2012-02-25 MED ORDER — POLYETHYLENE GLYCOL 3350 17 G PO PACK: 17.0000 g | PACK | Freq: Every day | ORAL | Status: DC | PRN

## 2012-02-25 MED ORDER — BUPIVACAINE 0.25 % ON-Q PUMP SINGLE CATH 300ML
INJECTION | Status: AC
  Filled 2012-02-25: qty 300

## 2012-02-25 SURGICAL SUPPLY — 56 items
BAG SPEC THK2 15X12 ZIP CLS (MISCELLANEOUS) ×1
BAG ZIPLOCK 12X15 (MISCELLANEOUS) ×2 IMPLANT
BANDAGE ELASTIC 6 VELCRO ST LF (GAUZE/BANDAGES/DRESSINGS) ×2 IMPLANT
BANDAGE ESMARK 6X9 LF (GAUZE/BANDAGES/DRESSINGS) ×1 IMPLANT
BLADE SAG 18X100X1.27 (BLADE) ×2 IMPLANT
BLADE SAW SGTL 11.0X1.19X90.0M (BLADE) ×2 IMPLANT
BNDG CMPR 9X6 STRL LF SNTH (GAUZE/BANDAGES/DRESSINGS) ×1
BNDG ESMARK 6X9 LF (GAUZE/BANDAGES/DRESSINGS) ×2
BOWL SMART MIX CTS (DISPOSABLE) ×2 IMPLANT
CATH KIT ON-Q SILVERSOAK 5 (CATHETERS) ×1 IMPLANT
CATH KIT ON-Q SILVERSOAK 5IN (CATHETERS) ×2 IMPLANT
CEMENT HV SMART SET (Cement) ×4 IMPLANT
CLOTH BEACON ORANGE TIMEOUT ST (SAFETY) ×2 IMPLANT
CLSR STERI-STRIP ANTIMIC 1/2X4 (GAUZE/BANDAGES/DRESSINGS) ×1 IMPLANT
CUFF TOURN SGL QUICK 34 (TOURNIQUET CUFF) ×2
CUFF TRNQT CYL 34X4X40X1 (TOURNIQUET CUFF) ×1 IMPLANT
DRAPE EXTREMITY T 121X128X90 (DRAPE) ×2 IMPLANT
DRAPE POUCH INSTRU U-SHP 10X18 (DRAPES) ×2 IMPLANT
DRAPE U-SHAPE 47X51 STRL (DRAPES) ×2 IMPLANT
DRSG ADAPTIC 3X8 NADH LF (GAUZE/BANDAGES/DRESSINGS) ×2 IMPLANT
DRSG PAD ABDOMINAL 8X10 ST (GAUZE/BANDAGES/DRESSINGS) ×1 IMPLANT
DRSG TEGADERM 2-3/8X2-3/4 SM (GAUZE/BANDAGES/DRESSINGS) ×1 IMPLANT
DURAPREP 26ML APPLICATOR (WOUND CARE) ×2 IMPLANT
ELECT REM PT RETURN 9FT ADLT (ELECTROSURGICAL) ×2
ELECTRODE REM PT RTRN 9FT ADLT (ELECTROSURGICAL) ×1 IMPLANT
EVACUATOR 1/8 PVC DRAIN (DRAIN) ×2 IMPLANT
FACESHIELD LNG OPTICON STERILE (SAFETY) ×10 IMPLANT
GLOVE BIO SURGEON STRL SZ7.5 (GLOVE) ×2 IMPLANT
GLOVE BIO SURGEON STRL SZ8 (GLOVE) ×2 IMPLANT
GLOVE BIOGEL PI IND STRL 8 (GLOVE) ×2 IMPLANT
GLOVE BIOGEL PI INDICATOR 8 (GLOVE) ×2
GOWN STRL NON-REIN LRG LVL3 (GOWN DISPOSABLE) ×5 IMPLANT
GOWN STRL REIN XL XLG (GOWN DISPOSABLE) ×3 IMPLANT
HANDPIECE INTERPULSE COAX TIP (DISPOSABLE) ×2
IMMOBILIZER KNEE 20 (SOFTGOODS) ×2
IMMOBILIZER KNEE 20 THIGH 36 (SOFTGOODS) ×1 IMPLANT
KIT BASIN OR (CUSTOM PROCEDURE TRAY) ×2 IMPLANT
MANIFOLD NEPTUNE II (INSTRUMENTS) ×2 IMPLANT
NS IRRIG 1000ML POUR BTL (IV SOLUTION) ×2 IMPLANT
PACK TOTAL JOINT (CUSTOM PROCEDURE TRAY) ×2 IMPLANT
PAD ABD 7.5X8 STRL (GAUZE/BANDAGES/DRESSINGS) ×2 IMPLANT
PADDING CAST COTTON 6X4 STRL (CAST SUPPLIES) ×4 IMPLANT
POSITIONER SURGICAL ARM (MISCELLANEOUS) ×2 IMPLANT
SET HNDPC FAN SPRY TIP SCT (DISPOSABLE) ×1 IMPLANT
SPONGE GAUZE 4X4 12PLY (GAUZE/BANDAGES/DRESSINGS) ×2 IMPLANT
STRIP CLOSURE SKIN 1/2X4 (GAUZE/BANDAGES/DRESSINGS) ×4 IMPLANT
SUCTION FRAZIER 12FR DISP (SUCTIONS) ×2 IMPLANT
SUT MNCRL AB 4-0 PS2 18 (SUTURE) ×2 IMPLANT
SUT PDS AB 1 CT1 27 (SUTURE) ×3 IMPLANT
SUT VIC AB 2-0 CT1 27 (SUTURE) ×6
SUT VIC AB 2-0 CT1 TAPERPNT 27 (SUTURE) ×3 IMPLANT
SUT VLOC 180 0 24IN GS25 (SUTURE) ×2 IMPLANT
TOWEL OR 17X26 10 PK STRL BLUE (TOWEL DISPOSABLE) ×4 IMPLANT
TRAY FOLEY CATH 14FRSI W/METER (CATHETERS) ×2 IMPLANT
WATER STERILE IRR 1500ML POUR (IV SOLUTION) ×2 IMPLANT
WRAP KNEE MAXI GEL POST OP (GAUZE/BANDAGES/DRESSINGS) ×3 IMPLANT

## 2012-02-25 NOTE — Anesthesia Preprocedure Evaluation (Signed)
Anesthesia Evaluation  Patient identified by MRN, date of birth, ID band Patient awake    Reviewed: Allergy & Precautions, H&P , NPO status , Patient's Chart, lab work & pertinent test results  History of Anesthesia Complications (+) PONV  Airway Mallampati: II TM Distance: <3 FB Neck ROM: Full    Dental No notable dental hx.    Pulmonary neg pulmonary ROS,  breath sounds clear to auscultation  Pulmonary exam normal       Cardiovascular hypertension, Pt. on medications + Valvular Problems/Murmurs Rhythm:Regular Rate:Normal     Neuro/Psych negative neurological ROS  negative psych ROS   GI/Hepatic negative GI ROS, Neg liver ROS,   Endo/Other  negative endocrine ROS  Renal/GU negative Renal ROS  negative genitourinary   Musculoskeletal negative musculoskeletal ROS (+)   Abdominal   Peds negative pediatric ROS (+)  Hematology negative hematology ROS (+)   Anesthesia Other Findings   Reproductive/Obstetrics negative OB ROS                           Anesthesia Physical Anesthesia Plan  ASA: II  Anesthesia Plan: Spinal   Post-op Pain Management:    Induction:   Airway Management Planned: Simple Face Mask  Additional Equipment:   Intra-op Plan:   Post-operative Plan:   Informed Consent: I have reviewed the patients History and Physical, chart, labs and discussed the procedure including the risks, benefits and alternatives for the proposed anesthesia with the patient or authorized representative who has indicated his/her understanding and acceptance.     Plan Discussed with: CRNA and Surgeon  Anesthesia Plan Comments:         Anesthesia Quick Evaluation

## 2012-02-25 NOTE — Transfer of Care (Signed)
Immediate Anesthesia Transfer of Care Note  Patient: [name]  Procedure(s) Performed: Procedure(s) (LRB): TOTAL KNEE ARTHROPLASTY (Right)  Patient Location: PACU  Anesthesia Type: Regional and Spinal  Level of Consciousness: awake, sedated and patient cooperative  Airway & Oxygen Therapy: Patient Spontanous Breathing and Patient connected to face mask oxygen  Post-op Assessment: Report given to PACU RN and Post -op Vital signs reviewed and stable  Post vital signs: Reviewed and stable  Complications: No apparent anesthesia complications

## 2012-02-25 NOTE — Progress Notes (Signed)
Utilization review completed.  

## 2012-02-25 NOTE — Anesthesia Procedure Notes (Signed)

## 2012-02-25 NOTE — Op Note (Signed)
Pre-operative diagnosis- Osteoarthritis  Right knee(s)  Post-operative diagnosis- Osteoarthritis Right knee(s)  Procedure-  Right  Total Knee Arthroplasty  Surgeon- Gus Rankin. Lataria Courser, MD  Assistant- Dimitri Ped, PA-C   Anesthesia-  Spinal EBL-* No blood loss amount entered *  Drains Hemovac  Tourniquet time-  Total Tourniquet Time Documented: Thigh (Right) - 33 minutes   Complications- None  Condition-PACU - hemodynamically stable.   Brief Clinical Note  [name] is a 71 y.o. year old female with end stage OA of her right knee with progressively worsening pain and dysfunction. She has constant pain, with activity and at rest and significant functional deficits with difficulties even with ADLs. She has had extensive non-op management including analgesics, injections of cortisone and viscosupplements, and home exercise program, but remains in significant pain with significant dysfunction.Radiographs show bone on bone arthritis medial and patellofemoral with tibial subluxation. She presents now for left Total Knee Arthroplasty.    Procedure in detail---   The patient is brought into the operating room and positioned supine on the operating table. After successful administration of  Spinal,   a tourniquet is placed high on the  Right thigh(s) and the lower extremity is prepped and draped in the usual sterile fashion. Time out is performed by the operating team and then the  Right lower extremity is wrapped in Esmarch, knee flexed and the tourniquet inflated to 300 mmHg.       A midline incision is made with a ten blade through the subcutaneous tissue to the level of the extensor mechanism. A fresh blade is used to make a medial parapatellar arthrotomy. Soft tissue over the proximal medial tibia is subperiosteally elevated to the joint line with a knife and into the semimembranosus bursa with a Cobb elevator. Soft tissue over the proximal lateral tibia is elevated with attention  being paid to avoiding the patellar tendon on the tibial tubercle. The patella is everted, knee flexed 90 degrees and the ACL and PCL are removed. Findings are bone on bone medial and patellofemoral with medial osteophytes.        The drill is used to create a starting hole in the distal femur and the canal is thoroughly irrigated with sterile saline to remove the fatty contents. The 5 degree Right  valgus alignment guide is placed into the femoral canal and the distal femoral cutting block is pinned to remove 10 mm off the distal femur. Resection is made with an oscillating saw.      The tibia is subluxed forward and the menisci are removed. The extramedullary alignment guide is placed referencing proximally at the medial aspect of the tibial tubercle and distally along the second metatarsal axis and tibial crest. The block is pinned to remove 2mm off the more deficient medial  side. Resection is made with an oscillating saw. Size 3is the most appropriate size for the tibia and the proximal tibia is prepared with the modular drill and keel punch for that size.      The femoral sizing guide is placed and size 4 narrow is most appropriate. Rotation is marked off the epicondylar axis and confirmed by creating a rectangular flexion gap at 90 degrees. The size 4 cutting block is pinned in this rotation and the anterior, posterior and chamfer cuts are made with the oscillating saw. The intercondylar block is then placed and that cut is made.      Trial size 3 tibial component, trial size 4 narrow posterior stabilized femur and  a 12.5  mm posterior stabilized rotating platform insert trial is placed. Full extension is achieved with excellent varus/valgus and anterior/posterior balance throughout full range of motion. The patella is everted and thickness measured to be 22  mm. Free hand resection is taken to 12 mm, a 35 template is placed, lug holes are drilled, trial patella is placed, and it tracks normally.  Osteophytes are removed off the posterior femur with the trial in place. All trials are removed and the cut bone surfaces prepared with pulsatile lavage. Cement is mixed and once ready for implantation, the size 3 tibial implant, size  4 narrow posterior stabilized femoral component, and the size 35 patella are cemented in place and the patella is held with the clamp. The trial insert is placed and the knee held in full extension. All extruded cement is removed and once the cement is hard the permanent 12.5 mm posterior stabilized rotating platform insert is placed into the tibial tray.      The wound is copiously irrigated with saline solution and the extensor mechanism closed over a hemovac drain with #1 PDS suture. The tourniquet is released for a total tourniquet time of 33  minutes. Flexion against gravity is 140 degrees and the patella tracks normally. Subcutaneous tissue is closed with 2.0 vicryl and subcuticular with running 4.0 Monocryl. The catheter for the Marcaine pain pump is placed and the pump is initiated. The incision is cleaned and dried and steri-strips and a bulky sterile dressing are applied. The limb is placed into a knee immobilizer and the patient is awakened and transported to recovery in stable condition.      Please note that a surgical assistant was a medical necessity for this procedure in order to perform it in a safe and expeditious manner. Surgical assistant was necessary to retract the ligaments and vital neurovascular structures to prevent injury to them and also necessary for proper positioning of the limb to allow for anatomic placement of the prosthesis.   Gus Rankin Hai Grabe, MD    02/25/2012, 11:14 AM

## 2012-02-25 NOTE — Anesthesia Postprocedure Evaluation (Signed)
  Anesthesia Post-op Note  Patient: [name]  Procedure(s) Performed: Procedure(s) (LRB): TOTAL KNEE ARTHROPLASTY (Right)  Patient Location: PACU  Anesthesia Type: Spinal  Level of Consciousness: awake and alert   Airway and Oxygen Therapy: Patient Spontanous Breathing  Post-op Pain: mild  Post-op Assessment: Post-op Vital signs reviewed, Patient's Cardiovascular Status Stable, Respiratory Function Stable, Patent Airway and No signs of Nausea or vomiting  Post-op Vital Signs: stable  Complications: No apparent anesthesia complications

## 2012-02-26 ENCOUNTER — Encounter (HOSPITAL_COMMUNITY): Payer: Self-pay | Admitting: Orthopedic Surgery

## 2012-02-26 LAB — BASIC METABOLIC PANEL
BUN: 7 mg/dL (ref 6–23)
CO2: 25 mEq/L (ref 19–32)
Calcium: 8.4 mg/dL (ref 8.4–10.5)
Chloride: 105 mEq/L (ref 96–112)
Creatinine, Ser: 0.44 mg/dL — ABNORMAL LOW (ref 0.50–1.10)
GFR calc Af Amer: >90 mL/min (ref >90)
GFR calc non Af Amer: >90 mL/min (ref >90)
Glucose, Bld: 141 mg/dL — ABNORMAL HIGH (ref 70–99)
Potassium: 3.7 mEq/L (ref 3.5–5.1)
Sodium: 139 mEq/L (ref 135–145)

## 2012-02-26 LAB — CBC
HCT: 34.6 % — ABNORMAL LOW (ref 36.0–46.0)
Hemoglobin: 11.1 g/dL — ABNORMAL LOW (ref 12.0–15.0)
MCH: 28 pg (ref 26.0–34.0)
MCHC: 32.1 g/dL (ref 30.0–36.0)
MCV: 87.2 fL (ref 78.0–100.0)
Platelets: 211 10*3/uL (ref 150–400)
RBC: 3.97 MIL/uL (ref 3.87–5.11)
RDW: 12 % (ref 11.5–15.5)
WBC: 12.5 10*3/uL — ABNORMAL HIGH (ref 4.0–10.5)

## 2012-02-26 MED ORDER — MORPHINE SULFATE 2 MG/ML IJ SOLN: 1.0000 mg | INTRAMUSCULAR | Status: DC | PRN

## 2012-02-26 MED ORDER — ATORVASTATIN CALCIUM 10 MG PO TABS
10.0000 mg | ORAL_TABLET | Freq: Every day | ORAL | Status: DC
  Administered 2012-02-27: 10 mg via ORAL
  Filled 2012-02-26 (×2): qty 1

## 2012-02-26 NOTE — Evaluation (Signed)
Physical Therapy Evaluation Patient Details Name: [name] MRN: [id number] DOB: 12/16/1940 Today's Date: 02/26/2012 Time: 1020-1058 PT Time Calculation (min): 38 min  PT Assessment / Plan / Recommendation Clinical Impression  71 yo female s/p R TKA. Mobilizng fairly well except for N/V. Vomited x 2 during session. Anticipate pt will progress well with mobility. Recommend HHPT and RW.     PT Assessment  Patient needs continued PT services    Follow Up Recommendations  Home health PT    Barriers to Discharge        Equipment Recommendations  Rolling walker with 5" wheels    Recommendations for Other Services     Frequency 7X/week    Precautions / Restrictions Precautions Precautions: Knee Required Braces or Orthoses: Knee Immobilizer - Right Knee Immobilizer - Right: Discontinue once straight leg raise with < 10 degree lag Restrictions Weight Bearing Restrictions: No RLE Weight Bearing: Weight bearing as tolerated   Pertinent Vitals/Pain       Mobility  Bed Mobility Bed Mobility: Supine to Sit Supine to Sit: HOB elevated;With rails;4: Min assist Details for Bed Mobility Assistance: VCs safety, technique, hand placement. Assist for R LE off bed.  Transfers Transfers: Sit to Stand;Stand to Sit Sit to Stand: 4: Min assist;Without upper extremity assist;From bed Stand to Sit: 4: Min assist;With upper extremity assist;To chair/3-in-1;With armrests Details for Transfer Assistance: VCs safety, technique, hand placement. Assist to rise, stabilize, control descent.  Ambulation/Gait Ambulation/Gait Assistance: 4: Min assist Ambulation Distance (Feet): 15 Feet Assistive device: Rolling walker Ambulation/Gait Assistance Details: VCs safety, technique, sequence. Assist to stabilize throughout ambulation. Limited distance due to N/V-pt vomited x 2 during walk.  Gait Pattern: Step-to pattern;Antalgic;Decreased stride length;Decreased step length - right    Exercises       PT Diagnosis: Difficulty walking;Abnormality of gait;Acute pain  PT Problem List: Decreased strength;Decreased range of motion;Decreased activity tolerance;Decreased mobility;Pain;Decreased knowledge of use of DME PT Treatment Interventions: DME instruction;Gait training;Stair training;Functional mobility training;Therapeutic activities;Therapeutic exercise;Patient/family education   PT Goals Acute Rehab PT Goals PT Goal Formulation: With patient Time For Goal Achievement: 03/04/12 Potential to Achieve Goals: Good Pt will go Supine/Side to Sit: with supervision PT Goal: Supine/Side to Sit - Progress: Goal set today Pt will go Sit to Supine/Side: with supervision PT Goal: Sit to Supine/Side - Progress: Goal set today Pt will go Sit to Stand: with supervision PT Goal: Sit to Stand - Progress: Goal set today Pt will Ambulate: 51 - 150 feet;with supervision;with least restrictive assistive device PT Goal: Ambulate - Progress: Goal set today Pt will Go Up / Down Stairs: 1-2 stairs;with min assist;with least restrictive assistive device PT Goal: Up/Down Stairs - Progress: Goal set today (1 step)  Visit Information  Last PT Received On: 02/26/12 Assistance Needed: +1    Subjective Data  Subjective: "I can't keep anything down" Patient Stated Goal: Feel better. Get going   Prior Functioning  Home Living Lives With: Spouse Available Help at Discharge: Family (daughter) Type of Home: House Home Access: Stairs to enter [employer] of Steps: 1(high) Home Layout: Two level;Able to live on main level with bedroom/bathroom Bathroom Shower/Tub: Health visitor: Standard Home Adaptive Equipment: Bedside commode/3-in-1;Shower chair with back;Hand-held shower hose Prior Function Level of Independence: Independent Able to Take Stairs?: Yes Driving: Yes Communication Communication: No difficulties    Cognition  Overall Cognitive Status: Appears within  functional limits for tasks assessed/performed Arousal/Alertness: Awake/alert Orientation Level: Appears intact for tasks assessed Behavior During Session:  WFL for tasks performed    Extremity/Trunk Assessment Right Lower Extremity Assessment RLE ROM/Strength/Tone: Deficits RLE ROM/Strength/Tone Deficits: SLR 2/5. Moves ankle well.  RLE Sensation: WFL - Light Touch Left Lower Extremity Assessment LLE ROM/Strength/Tone: WFL for tasks assessed Trunk Assessment Trunk Assessment: Normal   Balance    End of Session PT - End of Session Equipment Utilized During Treatment: Gait belt;Right knee immobilizer Activity Tolerance:  (Limited by N/V) Patient left: in chair;with call bell/phone within reach CPM Right Knee CPM Right Knee: Off  GP     [name] [facility] 02/26/2012, 11:05 AM [phone]

## 2012-02-26 NOTE — Progress Notes (Signed)
Subjective: 1 Day Post-Op Procedure(s) (LRB): TOTAL KNEE ARTHROPLASTY (Right) Patient reports pain as moderate and severe.  Patient had a rough night with pain and nausea. Patient seen in rounds with Dr. Lequita Halt. Patient is having problems with pain in the knee, requiring pain medications We will start therapy today.  Plan is to go Home after hospital stay.  Objective: Vital signs in last 24 hours: Temp:  [96.3 F (35.7 C)-98.2 F (36.8 C)] 98 F (36.7 C) (07/23 0455) Pulse Rate:  [55-86] 68  (07/23 0455) Resp:  [11-20] 20  (07/23 0455) BP: (106-156)/(59-87) 130/73 mmHg (07/23 0455) SpO2:  [98 %-100 %] 99 % (07/23 0455) Weight:  [75.751 kg (167 lb)] 75.751 kg (167 lb) (07/22 1403)  Intake/Output from previous day:  Intake/Output Summary (Last 24 hours) at 02/26/12 0742 Last data filed at 02/26/12 0600  Gross per 24 hour  Intake 4633.75 ml  Output   4340 ml  Net 293.75 ml    Intake/Output this shift:    Labs:  Basename 02/26/12 0446  HGB 11.1*    Basename 02/26/12 0446  WBC 12.5*  RBC 3.97  HCT 34.6*  PLT 211    Basename 02/26/12 0446  NA 139  K 3.7  CL 105  CO2 25  BUN 7  CREATININE 0.44*  GLUCOSE 141*  CALCIUM 8.4   No results found for this basename: LABPT:2,INR:2 in the last 72 hours  EXAM General - Patient is Alert, Appropriate and Oriented Extremity - Neurovascular intact Sensation intact distally Dorsiflexion/Plantar flexion intact Dressing - dressing C/D/I Motor Function - intact, moving foot and toes well on exam.  Hemovac pulled without difficulty.  Past Medical History  Diagnosis Date  . Dyslipidemia   . Polymyalgia rheumatica   . HTN (hypertension)   . Mitral valve prolapse     echo, January, 2011, moderate prolapse with your leaflet, trivial MR   Antibiotic required for procedures  . Tricuspid regurgitation     mild to moderate, echo, January, 2011, PA pressure 38 mm mercury  . 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 Hyponatremia   . History of skin cancer   . Rheumatoid arthritis   . Chronic cystitis   . Hemorrhoids   . Hx of colonoscopy   . IBS (irritable bowel syndrome)   . History of colon polyps   . Ejection fraction     EF 65%, echo, 2011  . Potassium (K) deficiency     5 potassium requirement over time  . Syncope     August, 200 weight, dehydration  . 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 S/P knee surgery     clearance done January, 2012  . CIN I (cervical intraepithelial neoplasia I)   . Arthritis   . Osteoporosis   . PONV (postoperative nausea and vomiting)   . Heart murmur     MVP   . Cancer     hx of basal cell carcinoma     Assessment/Plan: 1 Day Post-Op Procedure(s) (LRB): TOTAL KNEE ARTHROPLASTY (Right) Active Problems:  * No active hospital problems. *    Up with therapy Continue foley due to strict I&O and urinary output monitoring Discharge home with home health  DVT Prophylaxis - Xarelto Weight-Bearing as tolerated to right leg Keep foley until tomorrow. No vaccines. D/C PCA Morphine, Change to IV push D/C O2 and Pulse OX and try on Room Air  PERKINS, ALEXZANDREW 02/26/2012, 7:42 AM

## 2012-02-26 NOTE — Progress Notes (Signed)
Physical Therapy Treatment Patient Details Name: [name] MRN: [id number] DOB: 1941/02/26 Today's Date: 02/26/2012 Time: 9562-1308 PT Time Calculation (min): 44 min  PT Assessment / Plan / Recommendation Comments on Treatment Session  Progressing slowly. Still experiencing nausea with mobility.     Follow Up Recommendations  Home health PT    Barriers to Discharge        Equipment Recommendations  Rolling walker with 5" wheels    Recommendations for Other Services    Frequency 7X/week   Plan Discharge plan remains appropriate    Precautions / Restrictions Precautions Precautions: Knee Required Braces or Orthoses: Knee Immobilizer - Right Knee Immobilizer - Right: Discontinue once straight leg raise with < 10 degree lag Restrictions Weight Bearing Restrictions: No RLE Weight Bearing: Weight bearing as tolerated   Pertinent Vitals/Pain     Mobility  Bed Mobility Bed Mobility: Sit to Supine Sit to Supine: 4: Min assist Details for Bed Mobility Assistance: Assist for R LE onto bed. VCs technique Transfers Transfers: Sit to Stand;Stand to Sit Sit to Stand: 4: Min assist;From chair/3-in-1;With upper extremity assist;With armrests Stand to Sit: 4: Min guard;To bed Details for Transfer Assistance: VCs safety, technique, hand placement. Assist to rise, stabilize.  Ambulation/Gait Ambulation/Gait Assistance: 4: Min guard Ambulation Distance (Feet): 50 Feet Assistive device: Rolling walker Ambulation/Gait Assistance Details: VCs safety, technique, sequence, posture. Slow gait speed. Still experiencing nausea during ambulation.  Gait Pattern: Step-to pattern;Decreased stride length;Decreased step length - right;Antalgic    Exercises Total Joint Exercises Ankle Circles/Pumps: AROM;Both;10 reps;Supine Quad Sets: AROM;Both;10 reps;Supine Short Arc Quad: AAROM;Right;10 reps;Supine Heel Slides: AAROM;Right;10 reps;Supine Hip ABduction/ADduction: AAROM;Right;10  reps;Supine Straight Leg Raises: AAROM;Right;10 reps;Supine   PT Diagnosis:    PT Problem List:   PT Treatment Interventions:     PT Goals Acute Rehab PT Goals Pt will go Supine/Side to Sit: with supervision PT Goal: Supine/Side to Sit - Progress: Progressing toward goal Pt will go Sit to Supine/Side: with supervision PT Goal: Sit to Supine/Side - Progress: Progressing toward goal Pt will go Sit to Stand: with supervision PT Goal: Sit to Stand - Progress: Progressing toward goal Pt will Ambulate: 51 - 150 feet;with supervision;with least restrictive assistive device PT Goal: Ambulate - Progress: Progressing toward goal  Visit Information  Last PT Received On: 02/26/12 Assistance Needed: +1    Subjective Data  Subjective: "I haven't needed any pain meds since this am" Patient Stated Goal: Home   Cognition  Overall Cognitive Status: Appears within functional limits for tasks assessed/performed Arousal/Alertness: Awake/alert Orientation Level: Appears intact for tasks assessed Behavior During Session: [facility] for tasks performed    Balance     End of Session PT - End of Session Equipment Utilized During Treatment: Gait belt;Right knee immobilizer Activity Tolerance: Patient tolerated treatment well Patient left: in bed;with call bell/phone within reach;with family/visitor present   GP     [name] [facility] 02/26/2012, 3:38 PM [phone]

## 2012-02-26 NOTE — Progress Notes (Signed)
Pt asked about her second dose of Coreg. She was only prescribed once a day. Paged on call PA. Matt Babbish called back and gave verbal order to give the Coreg BID

## 2012-02-27 LAB — BASIC METABOLIC PANEL
BUN: 8 mg/dL (ref 6–23)
CO2: 24 mEq/L (ref 19–32)
Calcium: 8.2 mg/dL — ABNORMAL LOW (ref 8.4–10.5)
Chloride: 101 mEq/L (ref 96–112)
Creatinine, Ser: 0.48 mg/dL — ABNORMAL LOW (ref 0.50–1.10)
GFR calc Af Amer: >90 mL/min (ref >90)
GFR calc non Af Amer: >90 mL/min (ref >90)
Glucose, Bld: 114 mg/dL — ABNORMAL HIGH (ref 70–99)
Potassium: 4.2 mEq/L (ref 3.5–5.1)
Sodium: 134 mEq/L — ABNORMAL LOW (ref 135–145)

## 2012-02-27 LAB — CBC
HCT: 31.8 % — ABNORMAL LOW (ref 36.0–46.0)
Hemoglobin: 10.4 g/dL — ABNORMAL LOW (ref 12.0–15.0)
MCH: 28.7 pg (ref 26.0–34.0)
MCHC: 32.7 g/dL (ref 30.0–36.0)
MCV: 87.8 fL (ref 78.0–100.0)
Platelets: 210 10*3/uL (ref 150–400)
RBC: 3.62 MIL/uL — ABNORMAL LOW (ref 3.87–5.11)
RDW: 12.1 % (ref 11.5–15.5)
WBC: 11.3 10*3/uL — ABNORMAL HIGH (ref 4.0–10.5)

## 2012-02-27 NOTE — Progress Notes (Signed)
Physical Therapy Treatment Patient Details Name: [name] MRN: [id number] DOB: May 15, 1941 Today's Date: 02/27/2012 Time: 4098-1191 PT Time Calculation (min): 30 min  PT Assessment / Plan / Recommendation Comments on Treatment Session  Progressing slowly. Pt demonstrates poor activity tolerance today due to pain. No nausea/vomiting this session.     Follow Up Recommendations  Home health PT    Barriers to Discharge        Equipment Recommendations  Rolling walker with 5" wheels    Recommendations for Other Services    Frequency 7X/week   Plan Discharge plan remains appropriate    Precautions / Restrictions Precautions Precautions: Knee Required Braces or Orthoses: Knee Immobilizer - Right Knee Immobilizer - Right: Discontinue once straight leg raise with < 10 degree lag Restrictions Weight Bearing Restrictions: No RLE Weight Bearing: Weight bearing as tolerated   Pertinent Vitals/Pain 7/10 R posterior knee with activity    Mobility  Bed Mobility Bed Mobility: Sit to Supine Sit to Supine: 4: Min assist Details for Bed Mobility Assistance: Assist for R LE onto bed. VCs safety, technique.  Transfers Transfers: Sit to Stand;Stand to Sit Sit to Stand: 4: Min assist;With upper extremity assist;With armrests;From chair/3-in-1 Stand to Sit: 4: Min assist;With upper extremity assist;To bed Details for Transfer Assistance: VCs safety, technique, hand placement. Assist to rise, stabilize, control descent.  Ambulation/Gait Ambulation/Gait Assistance: 4: Min assist Ambulation Distance (Feet): 15 Feet (x 2) Assistive device: Rolling walker Ambulation/Gait Assistance Details: Ambulation to and from bathroom. Pt declined to ambulate further due to pain. Fatigues easily.  Gait Pattern: Step-to pattern;Trunk flexed;Antalgic;Decreased stance time - right;Decreased stride length;Decreased step length - right;Decreased step length - left    Exercises     PT Diagnosis:    PT  Problem List:   PT Treatment Interventions:     PT Goals Acute Rehab PT Goals Pt will go Sit to Supine/Side: with supervision PT Goal: Sit to Supine/Side - Progress: Progressing toward goal Pt will go Sit to Stand: with supervision PT Goal: Sit to Stand - Progress: Progressing toward goal Pt will Ambulate: 51 - 150 feet;with supervision;with least restrictive assistive device PT Goal: Ambulate - Progress: Progressing toward goal  Visit Information  Last PT Received On: 02/27/12 Assistance Needed: +1    Subjective Data  Subjective: "I'll try to walk a little" Patient Stated Goal: Home. Less pain.   Cognition  Overall Cognitive Status: Appears within functional limits for tasks assessed/performed Arousal/Alertness: Awake/alert Orientation Level: Appears intact for tasks assessed Behavior During Session: [facility] for tasks performed    Balance     End of Session PT - End of Session Equipment Utilized During Treatment: Gait belt;Right knee immobilizer Activity Tolerance: Patient limited by fatigue;Patient limited by pain Patient left: in bed;with call bell/phone within reach;with family/visitor present   GP     [name] [facility] 02/27/2012, 3:50 PM [phone]

## 2012-02-27 NOTE — Care Management Note (Unsigned)
    Page 1 of 2   02/27/2012     2:28:20 PM   CARE MANAGEMENT NOTE 02/27/2012  Patient:  [name], [name]   Account Number:  [id number]  Date Initiated:  02/27/2012  Documentation initiated by:  Colleen Can  Subjective/Objective Assessment:   dx osteoarthritis rt hip; total hip replacemnt on day of admission     Action/Plan:   CM spoke with patient ans spouse. Plans are for patient to return to her home in [place] where daughter and spouse will be caregivers. Genevieve Norlander will provide [facility] services and arrange for DME. RW has been delivered to rm. already has 3n1   Anticipated DC Date:  02/28/2012   Anticipated DC Plan:  HOME W HOME HEALTH SERVICES  In-house referral  NA      DC Planning Services  CM consult      [facility] Choice  HOME HEALTH   Choice offered to / List presented to:  C-1 Patient   DME arranged  NA      DME agency  NA     HH arranged  HH-2 PT      [facility] agency  [facility]   Status of service:  Completed, signed off Medicare Important Message given?  NA - LOS <3 / Initial given by admissions (If response is "NO", the following Medicare IM given date fields will be blank) Date Medicare IM given:   Date Additional Medicare IM given:    Discharge Disposition:    Per UR Regulation:    If discussed at Long Length of Stay Meetings, dates discussed:    Comments:

## 2012-02-27 NOTE — Progress Notes (Signed)
Physical Therapy Treatment Patient Details Name: [name] MRN: [id number] DOB: [birth year] Today's Date: 02/27/2012 Time: 1191-4782 PT Time Calculation (min): 20 min  PT Assessment / Plan / Recommendation Comments on Treatment Session  Pt c/o R knee pain and nausea with attempts at ambulation this session. Pt declined to ambulate beyond 2 feet due to nausea, pain. Performed ROM exercises seated/reclined in chair. Will attempt ambulation during 2nd session today, if able.     Follow Up Recommendations  Home health PT    Barriers to Discharge        Equipment Recommendations  Rolling walker with 5" wheels    Recommendations for Other Services    Frequency 7X/week   Plan Discharge plan remains appropriate    Precautions / Restrictions Precautions Precautions: Knee Required Braces or Orthoses: Knee Immobilizer - Right Knee Immobilizer - Right: Discontinue once straight leg raise with < 10 degree lag Restrictions Weight Bearing Restrictions: No RLE Weight Bearing: Weight bearing as tolerated   Pertinent Vitals/Pain Pt unable to tolerate ambulation this session due to pain, nausea. Pt reports pain as 7/10 R knee with activity.     Mobility  Bed Mobility Bed Mobility: Not assessed Supine to Sit: 4: Min assist;HOB flat Details for Bed Mobility Assistance: Pt sitting up in recliner at start of PT session Transfers Transfers: Stand to Sit;Sit to Stand Sit to Stand: 4: Min assist;With upper extremity assist;With armrests;From chair/3-in-1 Stand to Sit: To chair/3-in-1;With upper extremity assist;With armrests;4: Min assist Details for Transfer Assistance: VCs safety, technique, hand placement. Assist to rise, stabilize, control descent.  Ambulation/Gait Ambulation/Gait Assistance: 4: Min assist Ambulation Distance (Feet): 2 Feet Assistive device: Rolling walker Ambulation/Gait Assistance Details: Attempted ambulation but pt unable to tolerate. Pt requested to sit back  down and declined to ambulate further due to pain, nausea.  Gait Pattern: Step-to pattern;Decreased stride length;Decreased step length - right;Antalgic    Exercises Total Joint Exercises Ankle Circles/Pumps: AROM;Both;10 reps;Supine Quad Sets: AROM;Both;10 reps;Supine Short Arc Quad: AAROM;Right;10 reps;Supine Heel Slides: AAROM;Right;5 reps;Seated (towel under r foot. ) Hip ABduction/ADduction: AAROM;Right;10 reps;Supine Straight Leg Raises: AAROM;Right;10 reps;Supine   PT Diagnosis:    PT Problem List:   PT Treatment Interventions:     PT Goals Acute Rehab PT Goals Pt will go Sit to Stand: with supervision PT Goal: Sit to Stand - Progress: Progressing toward goal Pt will Ambulate: 51 - 150 feet;with supervision;with least restrictive assistive device PT Goal: Ambulate - Progress: Progressing toward goal  Visit Information  Last PT Received On: 02/27/12 Assistance Needed: +1    Subjective Data  Subjective: "It was hurting so bad last night. I barely made it over to the chair this morning" Patient Stated Goal: Home. Less pain   Cognition  Overall Cognitive Status: Appears within functional limits for tasks assessed/performed Arousal/Alertness: Awake/alert Orientation Level: Appears intact for tasks assessed Behavior During Session: [facility] for tasks performed    Balance     End of Session PT - End of Session Equipment Utilized During Treatment: Gait belt;Right knee immobilizer Activity Tolerance: Patient tolerated treatment well Patient left: in chair;with call bell/phone within reach;with family/visitor present CPM Right Knee CPM Right Knee: Off   GP     [name] [facility] 02/27/2012, 11:23 AM [phone]

## 2012-02-27 NOTE — Progress Notes (Signed)
   Subjective: 2 Days Post-Op Procedure(s) (LRB): TOTAL KNEE ARTHROPLASTY (Right) Patient reports pain as mild and moderate.  Pain improving. Patient seen in rounds by Dr. Lequita Halt. Patient is well, but has had some minor complaints of pain in the knee, requiring pain medications Plan is to go Home after hospital stay.  Objective: Vital signs in last 24 hours: Temp:  [97.6 F (36.4 C)-99.4 F (37.4 C)] 99.4 F (37.4 C) (07/24 1350) Pulse Rate:  [66-87] 85  (07/24 1742) Resp:  [16] 16  (07/24 1350) BP: (93-159)/(57-82) 159/82 mmHg (07/24 1742) SpO2:  [93 %-99 %] 96 % (07/24 1350)  Intake/Output from previous day:  Intake/Output Summary (Last 24 hours) at 02/27/12 2131 Last data filed at 02/27/12 1700  Gross per 24 hour  Intake 1316.67 ml  Output   3050 ml  Net -1733.33 ml    Intake/Output this shift:    Labs:  Basename 02/27/12 0350 02/26/12 0446  HGB 10.4* 11.1*    Basename 02/27/12 0350 02/26/12 0446  WBC 11.3* 12.5*  RBC 3.62* 3.97  HCT 31.8* 34.6*  PLT 210 211    Basename 02/27/12 0350 02/26/12 0446  NA 134* 139  K 4.2 3.7  CL 101 105  CO2 24 25  BUN 8 7  CREATININE 0.48* 0.44*  GLUCOSE 114* 141*  CALCIUM 8.2* 8.4   No results found for this basename: LABPT:2,INR:2 in the last 72 hours  EXAM General - Patient is Alert, Appropriate and Oriented Extremity - Neurovascular intact Sensation intact distally Dorsiflexion/Plantar flexion intact Dressing/Incision - clean, dry, no drainage, healing Motor Function - intact, moving foot and toes well on exam.   Past Medical History  Diagnosis Date  . Dyslipidemia   . Polymyalgia rheumatica   . HTN (hypertension)   . Mitral valve prolapse     echo, January, 2011, moderate prolapse with your leaflet, trivial MR   Antibiotic required for procedures  . Tricuspid regurgitation     mild to moderate, echo, January, 2011, PA pressure 38 mm mercury  . 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 Hyponatremia   . History of skin cancer   . Rheumatoid arthritis   . Chronic cystitis   . Hemorrhoids   . Hx of colonoscopy   . IBS (irritable bowel syndrome)   . History of colon polyps   . Ejection fraction     EF 65%, echo, 2011  . Potassium (K) deficiency     5 potassium requirement over time  . Syncope     August, 200 weight, dehydration  . 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 S/P knee surgery     clearance done January, 2012  . CIN I (cervical intraepithelial neoplasia I)   . Arthritis   . Osteoporosis   . PONV (postoperative nausea and vomiting)   . Heart murmur     MVP   . Cancer     hx of basal cell carcinoma     Assessment/Plan: 2 Days Post-Op Procedure(s) (LRB): TOTAL KNEE ARTHROPLASTY (Right) Active Problems:  Hyponatremia   Up with therapy Plan for discharge tomorrow Discharge home with home health  DVT Prophylaxis - Xarelto Weight-Bearing as tolerated to right leg  PERKINS, ALEXZANDREW 02/27/2012, 9:31 PM

## 2012-02-27 NOTE — Evaluation (Signed)
Occupational Therapy Evaluation Patient Details Name: [name] MRN: [id number] DOB: 12-Oct-1940 Today's Date: 02/27/2012 Time: 7829-5621 OT Time Calculation (min): 29 min  OT Assessment / Plan / Recommendation Clinical Impression  This 71 year old female was admitted for R TKA.  She presents with decreased activity tolerance due to decreased BP in standing and nausea.  She will benefit from continued OT in acute to reinforce education about adls and bathroom transfers.      OT Assessment  Patient needs continued OT Services    Follow Up Recommendations  No OT follow up (as long as activity tolerance improves)    Barriers to Discharge      Equipment Recommendations  Rolling walker with 5" wheels    Recommendations for Other Services    Frequency  Min 2X/week    Precautions / Restrictions Precautions Precautions: Knee Required Braces or Orthoses: Knee Immobilizer - Right Knee Immobilizer - Right: Discontinue once straight leg raise with < 10 degree lag Restrictions Weight Bearing Restrictions: Yes RLE Weight Bearing: Weight bearing as tolerated   Pertinent Vitals/Pain R knee sore; BP in sitting after transfer 93/57    ADL  Grooming: Performed;Set up Where Assessed - Grooming: Unsupported sitting Upper Body Bathing: Simulated;Set up Where Assessed - Upper Body Bathing: Unsupported sitting Lower Body Bathing: Performed;Moderate assistance Where Assessed - Lower Body Bathing: Supported sit to stand Upper Body Dressing: Simulated;Set up Where Assessed - Upper Body Dressing: Unsupported sitting Lower Body Dressing: Simulated;Moderate assistance Where Assessed - Lower Body Dressing: Supported sit to Pharmacist, hospital: Mining engineer Method: Stand pivot (bed to chair) Toileting - Clothing Manipulation and Hygiene: Simulated;Minimal assistance Where Assessed - Toileting Clothing Manipulation and Hygiene: Sit to stand from 3-in-1 or  toilet Equipment Used: Knee Immobilizer;Rolling walker Transfers/Ambulation Related to ADLs: pt dizzy during transfer; decreased activity tolerance ADL Comments: session limited by dizziness    OT Diagnosis: Generalized weakness  OT Problem List: Decreased strength;Decreased activity tolerance;Decreased knowledge of use of DME or AE;Pain OT Treatment Interventions: Self-care/ADL training;Patient/family education;DME and/or AE instruction   OT Goals Acute Rehab OT Goals OT Goal Formulation: With patient Time For Goal Achievement: 03/05/12 Potential to Achieve Goals: Good ADL Goals Pt Will Perform Lower Body Bathing: with min assist;Sit to stand from chair ADL Goal: Lower Body Bathing - Progress: Goal set today Pt Will Perform Lower Body Dressing: with min assist;Sit to stand from chair (pants only) ADL Goal: Lower Body Dressing - Progress: Goal set today Pt Will Transfer to Toilet: with min assist;Ambulation;3-in-1 (min guard) ADL Goal: Toilet Transfer - Progress: Goal set today Pt Will Perform Tub/Shower Transfer: with min assist;Ambulation;Shower transfer (3:1) ADL Goal: Web designer - Progress: Goal set today  Visit Information  Last OT Received On: 02/27/12 Assistance Needed: +1    Subjective Data  Subjective: "I was nauseaus yesterday" Patient Stated Goal: none stated   Prior Functioning  Vision/Perception  Home Living Lives With: Spouse Bathroom Shower/Tub: Walk-in Stage manager: Standard (with 3:1) Home Adaptive Equipment: Bedside commode/3-in-1;Shower chair with back;Hand-held shower hose Prior Function Level of Independence: Independent Comments: husband has Parkinson's.  Daughter will help with socks.   Communication Communication: No difficulties Dominant Hand: Right      Cognition  Overall Cognitive Status: Appears within functional limits for tasks assessed/performed Behavior During Session: [facility] for tasks performed    Extremity/Trunk  Assessment Right Upper Extremity Assessment RUE ROM/Strength/Tone: Within functional levels Left Upper Extremity Assessment LUE ROM/Strength/Tone: Within functional levels   Mobility  Bed Mobility Supine to Sit: 4: Min assist;HOB flat Transfers Sit to Stand: 4: Min assist;From bed Details for Transfer Assistance: min cues for hand placement   Exercise    Balance    End of Session OT - End of Session Activity Tolerance:  (limited by dizziness and nausea) Patient left: in chair;with call bell/phone within reach Nurse Communication:  (NT present:  will tell RN about BP) CPM Right Knee CPM Right Knee: Off  GO     Aryaan Persichetti 02/27/2012, 11:15 AM Marica Otter, OTR/L [phone] 02/27/2012

## 2012-02-28 LAB — CBC
HCT: 32.9 % — ABNORMAL LOW (ref 36.0–46.0)
Hemoglobin: 10.9 g/dL — ABNORMAL LOW (ref 12.0–15.0)
MCH: 28.8 pg (ref 26.0–34.0)
MCHC: 33.1 g/dL (ref 30.0–36.0)
MCV: 86.8 fL (ref 78.0–100.0)
Platelets: 217 10*3/uL (ref 150–400)
RBC: 3.79 MIL/uL — ABNORMAL LOW (ref 3.87–5.11)
RDW: 12 % (ref 11.5–15.5)
WBC: 10.6 10*3/uL — ABNORMAL HIGH (ref 4.0–10.5)

## 2012-02-28 MED ORDER — SODIUM CHLORIDE 0.9 % IV BOLUS (SEPSIS): 125.0000 mL | Freq: Once | INTRAVENOUS | Status: DC

## 2012-02-28 MED ORDER — OXYCODONE HCL 5 MG PO TABS: 5.0000 mg | ORAL_TABLET | ORAL | Status: AC | PRN

## 2012-02-28 MED ORDER — RIVAROXABAN 10 MG PO TABS: 10.0000 mg | ORAL_TABLET | Freq: Every day | ORAL | Status: DC

## 2012-02-28 MED ORDER — SODIUM CHLORIDE 0.9 % IV BOLUS (SEPSIS)
500.0000 mL | Freq: Once | INTRAVENOUS | Status: AC
  Administered 2012-02-28: 500 mL via INTRAVENOUS

## 2012-02-28 MED ORDER — METHOCARBAMOL 500 MG PO TABS: 500.0000 mg | ORAL_TABLET | Freq: Four times a day (QID) | ORAL | Status: AC | PRN

## 2012-02-28 MED ORDER — SODIUM CHLORIDE 0.9 % IV SOLN: INTRAVENOUS | Status: DC

## 2012-02-28 NOTE — Progress Notes (Signed)
   Subjective: 3 Days Post-Op Procedure(s) (LRB): TOTAL KNEE ARTHROPLASTY (Right) Patient reports pain as mild.   Patient seen in rounds by Dr. Lequita Halt. Patient is well, and has had no acute complaints or problems Patient is ready to go home later today probably after two sessions of therapy.  Objective: Vital signs in last 24 hours: Temp:  [98.7 F (37.1 C)-99.7 F (37.6 C)] 98.7 F (37.1 C) (07/25 0545) Pulse Rate:  [76-87] 76  (07/25 0545) Resp:  [16] 16  (07/25 0545) BP: (129-159)/(77-82) 132/77 mmHg (07/25 0545) SpO2:  [96 %-97 %] 96 % (07/25 0545)  Intake/Output from previous day:  Intake/Output Summary (Last 24 hours) at 02/28/12 1003 Last data filed at 02/27/12 1700  Gross per 24 hour  Intake    480 ml  Output   1200 ml  Net   -720 ml    Intake/Output this shift:    Labs:  Basename 02/28/12 0424 02/27/12 0350 02/26/12 0446  HGB 10.9* 10.4* 11.1*    Basename 02/28/12 0424 02/27/12 0350  WBC 10.6* 11.3*  RBC 3.79* 3.62*  HCT 32.9* 31.8*  PLT 217 210    Basename 02/27/12 0350 02/26/12 0446  NA 134* 139  K 4.2 3.7  CL 101 105  CO2 24 25  BUN 8 7  CREATININE 0.48* 0.44*  GLUCOSE 114* 141*  CALCIUM 8.2* 8.4   No results found for this basename: LABPT:2,INR:2 in the last 72 hours  EXAM: General - Patient is Alert, Appropriate and Oriented Extremity - Neurovascular intact Sensation intact distally Dorsiflexion/Plantar flexion intact Incision - clean, dry, no drainage Motor Function - intact, moving foot and toes well on exam.   Assessment/Plan: 3 Days Post-Op Procedure(s) (LRB): TOTAL KNEE ARTHROPLASTY (Right) Procedure(s) (LRB): TOTAL KNEE ARTHROPLASTY (Right) Past Medical History  Diagnosis Date  . Dyslipidemia   . Polymyalgia rheumatica   . HTN (hypertension)   . Mitral valve prolapse     echo, January, 2011, moderate prolapse with your leaflet, trivial MR   Antibiotic required for procedures  . Tricuspid regurgitation     mild to  moderate, echo, January, 2011, PA pressure 38 mm mercury  . 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 Hyponatremia   . History of skin cancer   . Rheumatoid arthritis   . Chronic cystitis   . Hemorrhoids   . Hx of colonoscopy   . IBS (irritable bowel syndrome)   . History of colon polyps   . Ejection fraction     EF 65%, echo, 2011  . Potassium (K) deficiency     5 potassium requirement over time  . Syncope     August, 200 weight, dehydration  . 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 S/P knee surgery     clearance done January, 2012  . CIN I (cervical intraepithelial neoplasia I)   . Arthritis   . Osteoporosis   . PONV (postoperative nausea and vomiting)   . Heart murmur     MVP   . Cancer     hx of basal cell carcinoma    Active Problems:  Hyponatremia   Discharge home with home health Diet - Cardiac diet Follow up - in 2 weeks Activity - WBAT Disposition - Home Condition Upon Discharge - Good D/C Meds - See DC Summary DVT Prophylaxis - Xarelto  Dallys Nowakowski 02/28/2012, 10:03 AM

## 2012-02-28 NOTE — Progress Notes (Signed)
Physical Therapy Treatment Patient Details Name: [name] MRN: [id number] DOB: 1941-03-08 Today's Date: 02/28/2012 Time: 1000-1033 PT Time Calculation (min): 33 min  PT Assessment / Plan / Recommendation Comments on Treatment Session  Progressing slowly. Pt continues to demonstrate poor activity tolerance due to pain, nausea, some c/o lightheadedness. Plan was for d/c today but pt may d/c tomorrow-depends on progress.     Follow Up Recommendations  Home health PT    Barriers to Discharge        Equipment Recommendations  Rolling walker with 5" wheels    Recommendations for Other Services    Frequency 7X/week   Plan Discharge plan remains appropriate    Precautions / Restrictions Precautions Precautions: Knee Required Braces or Orthoses: Knee Immobilizer - Right Knee Immobilizer - Right: Discontinue once straight leg raise with < 10 degree lag Restrictions Weight Bearing Restrictions: No RLE Weight Bearing: Weight bearing as tolerated   Pertinent Vitals/Pain     Mobility  Transfers: Sit to Stand;Stand to Sit Sit to Stand: 4: Min assist;With upper extremity assist;From chair/3-in-1;With armrests Stand to Sit: 4: Min assist;To chair/3-in-1;With armrests;With upper extremity assist Details for Transfer Assistance: VCs safety, technique, hand placement. Assist to rise, stabilize, control descent.  Ambulation/Gait Ambulation/Gait Assistance: 4: Min assist Ambulation Distance (Feet): 35 Feet (35'x1, 25'x1) Assistive device: Rolling walker Ambulation/Gait Assistance Details: VCs safety, sequence, posture. fatigues easily. seated rest break needed during session.  Gait Pattern: Step-to pattern;Trunk flexed;Decreased stance time - right;Antalgic;Decreased step length - right;Decreased step length - left Stairs: Yes Stairs Assistance: 4: Min assist Stairs Assistance Details (indicate cue type and reason): VCs safety, technique, sequence. Assist to stabilize and maneuver  RW.  Stair Management Technique: Step to pattern;With walker;Backwards Number of Stairs: 1     Exercises     PT Diagnosis:    PT Problem List:   PT Treatment Interventions:     PT Goals Acute Rehab PT Goals Pt will go Sit to Stand: with supervision PT Goal: Sit to Stand - Progress: Progressing toward goal Pt will Ambulate: 51 - 150 feet;with supervision;with least restrictive assistive device PT Goal: Ambulate - Progress: Progressing toward goal Pt will Go Up / Down Stairs: 1-2 stairs;with min assist;with least restrictive assistive device PT Goal: Up/Down Stairs - Progress: Met  Visit Information  Last PT Received On: 02/28/12 Assistance Needed: +1    Subjective Data  Subjective: "My blood pressure was low again" Patient Stated Goal: Home   Cognition  Overall Cognitive Status: Appears within functional limits for tasks assessed/performed Arousal/Alertness: Awake/[name] Orientation Level: Appears intact for tasks assessed Behavior During Session: [facility] for tasks performed    Balance     End of Session PT - End of Session Equipment Utilized During Treatment: Gait belt;Right knee immobilizer Activity Tolerance: Patient limited by fatigue Patient left: in chair;with call bell/phone within reach CPM Right Knee CPM Right Knee: Off   GP     [name] [facility] 02/28/2012, 10:53 AM [phone]

## 2012-02-28 NOTE — Progress Notes (Signed)
Physical Therapy Treatment Patient Details Name: [name] MRN: [id number] DOB: 01-Nov-1940 Today's Date: 02/28/2012 Time: 1410-1445 PT Time Calculation (min): 35 min  PT Assessment / Plan / Recommendation Comments on Treatment Session  Tolerated mobility much better this p.m. No c/o dizziness, nausea. Spoke with Kenard Gower about pt's performance and discussed with pt/family about d/c home today. All in agreement. Feel pt will be okay at home with family assistance.     Follow Up Recommendations  Home health PT    Barriers to Discharge        Equipment Recommendations  Rolling walker with 5" wheels    Recommendations for Other Services OT consult  Frequency 7X/week   Plan Discharge plan remains appropriate    Precautions / Restrictions Precautions Precautions: Knee Required Braces or Orthoses: Knee Immobilizer - Right Knee Immobilizer - Right: Discontinue once straight leg raise with < 10 degree lag Restrictions Weight Bearing Restrictions: No RLE Weight Bearing: Weight bearing as tolerated   Pertinent Vitals/Pain     Mobility  Bed Mobility Bed Mobility: Sit to Supine Sit to Supine: 4: Min assist Details for Bed Mobility Assistance: Assist for R LE onto bed.  Transfers Transfers: Sit to Stand;Stand to Sit Sit to Stand: 4: Min assist;With upper extremity assist;With armrests;From chair/3-in-1 Stand to Sit: 4: Min guard;With upper extremity assist;With armrests;To chair/3-in-1;To bed Details for Transfer Assistance: x 2. VCs safety, technique, hand placement. Assist to rise, stabilize, control descent.  Ambulation/Gait Ambulation/Gait Assistance: 4: Min guard Ambulation Distance (Feet): 80 Feet Assistive device: Rolling walker Ambulation/Gait Assistance Details: VCs safety, sequence, posture. No c/o dizziness, nausea with ambulation. Gait Pattern: Step-to pattern;Trunk flexed;Decreased stance time - right;Antalgic;Decreased stride length;Decreased step length -  right;Decreased step length - left    Exercises Total Joint Exercises Ankle Circles/Pumps: AROM;Both;10 reps;Supine Quad Sets: AROM;Both;10 reps;Supine Short Arc Quad: AAROM;Right;10 reps;Supine Heel Slides: AAROM;Right;10 reps;Supine Hip ABduction/ADduction: AAROM;Right;10 reps;Supine Straight Leg Raises: AAROM;Right;10 reps;Supine   PT Diagnosis:    PT Problem List:   PT Treatment Interventions:     PT Goals Acute Rehab PT Goals Pt will go Sit to Supine/Side: with supervision PT Goal: Sit to Supine/Side - Progress: Progressing toward goal Pt will go Sit to Stand: with supervision PT Goal: Sit to Stand - Progress: Progressing toward goal Pt will Ambulate: 51 - 150 feet;with supervision;with least restrictive assistive device PT Goal: Ambulate - Progress: Progressing toward goal  Visit Information  Last PT Received On: 02/28/12 Assistance Needed: +1    Subjective Data  Subjective: "I'm okay with going home" Patient Stated Goal: Home   Cognition  Overall Cognitive Status: Appears within functional limits for tasks assessed/performed Arousal/Alertness: Awake/alert Orientation Level: Appears intact for tasks assessed Behavior During Session: [facility] for tasks performed    Balance     End of Session PT - End of Session Equipment Utilized During Treatment: Gait belt;Right knee immobilizer Activity Tolerance: Patient tolerated treatment well Patient left: in bed;with call bell/phone within reach   GP     [name] [facility] 02/28/2012, 3:52 PM [phone]

## 2012-02-28 NOTE — Progress Notes (Signed)
Occupational Therapy Treatment Patient Details Name: [name] MRN: [id number] DOB: Apr 22, 1941 Today's Date: 02/28/2012 Time: 1500-1540 OT Time Calculation (min): 40 min  OT Assessment / Plan / Recommendation Comments on Treatment Session      Follow Up Recommendations  No OT follow up    Barriers to Discharge       Equipment Recommendations  Rolling walker with 5" wheels    Recommendations for Other Services    Frequency     Plan      Precautions / Restrictions Precautions Precautions: Knee Required Braces or Orthoses: Knee Immobilizer - Right Knee Immobilizer - Right: Discontinue once straight leg raise with < 10 degree lag Restrictions Weight Bearing Restrictions: No RLE Weight Bearing: Weight bearing as tolerated   Pertinent Vitals/Pain 6/10 right knee; repositioned with ice.  Pt was premedicated    ADL  Lower Body Bathing: Performed;Minimal assistance Where Assessed - Lower Body Bathing: Supported sitting (shower:  weight shifted for peri area) Toilet Transfer: Performed;Minimal assistance Toilet Transfer Method:  (ambulate) Toilet Transfer Equipment: Raised toilet seat with arms (or 3-in-1 over toilet) Tub/Shower Transfer: Performed;Minimal assistance (min cues) Tub/Shower Transfer Method: Science writer: Walk in shower;Shower seat with back Transfers/Ambulation Related to ADLs: Min A ambulating to bathroom and min a for bed mobility ADL Comments: performed shower: BP was low earlier:  performed in pm prior to discharge    OT Diagnosis:    OT Problem List:   OT Treatment Interventions:     OT Goals ADL Goals Pt Will Perform Lower Body Bathing: with min assist;Sit to stand from chair ADL Goal: Lower Body Bathing - Progress: Progressing toward goals Pt Will Transfer to Toilet: with min assist;Ambulation;3-in-1 ADL Goal: Toilet Transfer - Progress: Met Pt Will Perform Tub/Shower Transfer: with min assist;Ambulation;Shower  transfer ADL Goal: Web designer - Progress: Met  Visit Information  Last OT Received On: 02/28/12 Assistance Needed: +1    Subjective Data      Prior Functioning       Cognition  Overall Cognitive Status: Appears within functional limits for tasks assessed/performed Arousal/Alertness: Awake/alert Orientation Level: Appears intact for tasks assessed Behavior During Session: [facility] for tasks performed    Mobility Bed Mobility Bed Mobility: Sit to Supine Supine to Sit: 4: Min assist;HOB flat Sit to Supine: 4: Min assist Details for Bed Mobility Assistance: Assist for R LE onto bed.  Transfers Sit to Stand: 4: Min assist;With upper extremity assist;With armrests;From chair/3-in-1 (mod A from shower seat without arms) Stand to Sit: 4: Min guard;With upper extremity assist;With armrests;To chair/3-in-1;To bed Details for Transfer Assistance: x 2. VCs safety, technique, hand placement. Assist to rise, stabilize, control descent.    Exercises   Balance    End of Session OT - End of Session Equipment Utilized During Treatment: Right knee immobilizer (rw) Activity Tolerance: Patient limited by fatigue Patient left: in bed;with call bell/phone within reach;with family/visitor present  GO     Detrell Umscheid 02/28/2012, 4:21 PM Marica Otter, OTR/L [phone] 02/28/2012

## 2012-02-29 DIAGNOSIS — I10 Essential (primary) hypertension: Secondary | ICD-10-CM | POA: Diagnosis not present

## 2012-02-29 DIAGNOSIS — Z471 Aftercare following joint replacement surgery: Secondary | ICD-10-CM | POA: Diagnosis not present

## 2012-02-29 DIAGNOSIS — M1710 Unilateral primary osteoarthritis, unspecified knee: Secondary | ICD-10-CM | POA: Diagnosis not present

## 2012-02-29 DIAGNOSIS — M069 Rheumatoid arthritis, unspecified: Secondary | ICD-10-CM | POA: Diagnosis not present

## 2012-02-29 DIAGNOSIS — IMO0001 Reserved for inherently not codable concepts without codable children: Secondary | ICD-10-CM | POA: Diagnosis not present

## 2012-02-29 DIAGNOSIS — Z96659 Presence of unspecified artificial knee joint: Secondary | ICD-10-CM | POA: Diagnosis not present

## 2012-03-03 DIAGNOSIS — I10 Essential (primary) hypertension: Secondary | ICD-10-CM | POA: Diagnosis not present

## 2012-03-03 DIAGNOSIS — Z471 Aftercare following joint replacement surgery: Secondary | ICD-10-CM | POA: Diagnosis not present

## 2012-03-03 DIAGNOSIS — IMO0001 Reserved for inherently not codable concepts without codable children: Secondary | ICD-10-CM | POA: Diagnosis not present

## 2012-03-03 DIAGNOSIS — Z96659 Presence of unspecified artificial knee joint: Secondary | ICD-10-CM | POA: Diagnosis not present

## 2012-03-03 DIAGNOSIS — M069 Rheumatoid arthritis, unspecified: Secondary | ICD-10-CM | POA: Diagnosis not present

## 2012-03-03 NOTE — Discharge Summary (Signed)
Physician Discharge Summary   Patient ID: MIAROSE LIPPERT MRN: [id number] DOB/AGE: [birth year] 71 y.o.  Admit date: 02/25/2012 Discharge date: 02/28/2012  Primary Diagnosis: Osteoarthritis Right Knee   Admission Diagnoses:  Past Medical History  Diagnosis Date  . Dyslipidemia   . Polymyalgia rheumatica   . HTN (hypertension)   . Mitral valve prolapse     echo, January, 2011, moderate prolapse with your leaflet, trivial MR   Antibiotic required for procedures  . Tricuspid regurgitation     mild to moderate, echo, January, 2011, PA pressure 38 mm mercury  . 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 Hyponatremia   . History of skin cancer   . Rheumatoid arthritis   . Chronic cystitis   . Hemorrhoids   . Hx of colonoscopy   . IBS (irritable bowel syndrome)   . History of colon polyps   . Ejection fraction     EF 65%, echo, 2011  . Potassium (K) deficiency     5 potassium requirement over time  . Syncope     August, 200 weight, dehydration  . 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 S/P knee surgery     clearance done January, 2012  . CIN I (cervical intraepithelial neoplasia I)   . Arthritis   . Osteoporosis   . PONV (postoperative nausea and vomiting)   . Heart murmur     MVP   . Cancer     hx of basal cell carcinoma    Discharge Diagnoses:   Active Problems:  Hyponatremia  Procedure:  Procedure(s) (LRB): TOTAL KNEE ARTHROPLASTY (Right)   Consults: None  HPI: [name] is a 71 y.o. year old female with end stage OA of her right knee with progressively worsening pain and dysfunction. She has constant pain, with activity and at rest and significant functional deficits with difficulties even with ADLs. She has had extensive non-op management including analgesics, injections of cortisone and viscosupplements, and home exercise program, but remains in significant pain with significant  dysfunction.Radiographs show bone on bone arthritis medial and patellofemoral with tibial subluxation. She presents now for left Total Knee Arthroplasty.   Laboratory Data: Hospital Outpatient Visit on 02/18/2012  Component Date Value Range Status  . aPTT 02/18/2012 35  24 - 37 seconds Final  . WBC 02/18/2012 7.7  4.0 - 10.5 K/uL Final  . RBC 02/18/2012 4.71  3.87 - 5.11 MIL/uL Final  . Hemoglobin 02/18/2012 13.7  12.0 - 15.0 g/dL Final  . HCT 62/13/0865 41.8  36.0 - 46.0 % Final  . MCV 02/18/2012 88.7  78.0 - 100.0 fL Final  . MCH 02/18/2012 29.1  26.0 - 34.0 pg Final  . MCHC 02/18/2012 32.8  30.0 - 36.0 g/dL Final  . RDW 78/46/9629 11.8  11.5 - 15.5 % Final  . Platelets 02/18/2012 248  150 - 400 K/uL Final  . Sodium 02/18/2012 139  135 - 145 mEq/L Final  . Potassium 02/18/2012 4.7  3.5 - 5.1 mEq/L Final  . Chloride 02/18/2012 104  96 - 112 mEq/L Final  . CO2 02/18/2012 26  19 - 32 mEq/L Final  . Glucose, Bld 02/18/2012 93  70 - 99 mg/dL Final  . BUN 52/84/1324 15  6 - 23 mg/dL Final  . Creatinine, Ser 02/18/2012 0.66  0.50 - 1.10 mg/dL Final  . Calcium 40/05/2724 9.4  8.4 - 10.5 mg/dL Final  .  Total Protein 02/18/2012 6.7  6.0 - 8.3 g/dL Final  . Albumin 16/05/9603 3.9  3.5 - 5.2 g/dL Final  . AST 54/04/8118 20  0 - 37 U/L Final  . ALT 02/18/2012 15  0 - 35 U/L Final  . Alkaline Phosphatase 02/18/2012 68  39 - 117 U/L Final  . Total Bilirubin 02/18/2012 0.4  0.3 - 1.2 mg/dL Final  . GFR calc non Af Amer 02/18/2012 86* >90 mL/min Final  . GFR calc Af Amer 02/18/2012 >90  >90 mL/min Final   Comment:                                 The eGFR has been calculated                          using the CKD EPI equation.                          This calculation has not been                          validated in all clinical                          situations.                          eGFR's persistently                          <90 mL/min signify                          possible  Chronic Kidney Disease.  Marland Kitchen Prothrombin Time 02/18/2012 13.3  11.6 - 15.2 seconds Final  . INR 02/18/2012 0.99  0.00 - 1.49 Final  . Color, Urine 02/18/2012 YELLOW  YELLOW Final  . APPearance 02/18/2012 CLEAR  CLEAR Final  . Specific Gravity, Urine 02/18/2012 1.010  1.005 - 1.030 Final  . pH 02/18/2012 7.0  5.0 - 8.0 Final  . Glucose, UA 02/18/2012 NEGATIVE  NEGATIVE mg/dL Final  . Hgb urine dipstick 02/18/2012 NEGATIVE  NEGATIVE Final  . Bilirubin Urine 02/18/2012 NEGATIVE  NEGATIVE Final  . Ketones, ur 02/18/2012 NEGATIVE  NEGATIVE mg/dL Final  . Protein, ur 14/78/2956 NEGATIVE  NEGATIVE mg/dL Final  . Urobilinogen, UA 02/18/2012 0.2  0.0 - 1.0 mg/dL Final  . Nitrite 21/30/8657 NEGATIVE  NEGATIVE Final  . Leukocytes, UA 02/18/2012 NEGATIVE  NEGATIVE Final   MICROSCOPIC NOT DONE ON URINES WITH NEGATIVE PROTEIN, BLOOD, LEUKOCYTES, NITRITE, OR GLUCOSE <1000 mg/dL.  Marland Kitchen MRSA, PCR 02/18/2012 NEGATIVE  NEGATIVE Final  . Staphylococcus aureus 02/18/2012 NEGATIVE  NEGATIVE Final   Comment:                                 The Xpert SA Assay (FDA                          approved for NASAL specimens                          only),  is one component of                          a comprehensive surveillance                          program.  It is not intended                          to diagnose infection nor to                          guide or monitor treatment.   No results found for this basename: HGB:5 in the last 72 hours No results found for this basename: WBC:2,RBC:2,HCT:2,PLT:2 in the last 72 hours No results found for this basename: NA:2,K:2,CL:2,CO2:2,BUN:2,CREATININE:2,GLUCOSE:2,CALCIUM:2 in the last 72 hours No results found for this basename: LABPT:2,INR:2 in the last 72 hours  X-Rays:Dg Chest 2 View  02/18/2012  *RADIOLOGY REPORT*  Clinical Data: Preoperative respiratory exam for knee replacement  CHEST - 2 VIEW  Comparison: 08/23/2010  Findings: Heart size is normal.  Mediastinal  shadows are normal. Lungs are clear.  No effusions.  No bony abnormalities.  IMPRESSION: Normal chest  Original Report Authenticated By: Thomasenia Sales, M.D.    EKG: Orders placed in visit on 09/14/11  . EKG 12-LEAD     Hospital Course: Patient was admitted to [facility] and taken to the OR and underwent the above state procedure without complications.  Patient tolerated the procedure well and was later transferred to the recovery room and then to the orthopaedic floor for postoperative care.  They were given PO and IV analgesics for pain control following their surgery.  They were given 24 hours of postoperative antibiotics and started on DVT prophylaxis in the form of Xarelto.   PT and OT were ordered for total joint protocol.  Discharge planning consulted to help with postop disposition and equipment needs.  Patient had a rough night on the evening of surgery due to pain and started to get up OOB with therapy on day one.  PCA was discontinued and they were weaned over to PO meds.  Hemovac drain was pulled without difficulty.  Continued to work with therapy into day two.  Dressing was changed on day two and the incision was healing well.  By day three, the patient had progressed with therapy and meeting their goals.  Incision was healing well.  Patient was seen in rounds and was ready to go home.  Discharge Medications: Prior to Admission medications   Medication Sig Start Date End Date Taking? Authorizing Provider  amitriptyline (ELAVIL) 25 MG tablet Take 25 mg by mouth at bedtime.    Yes Historical Provider, MD  amLODipine (NORVASC) 5 MG tablet Take 5 mg by mouth daily with breakfast. 09/14/11  Yes Luis Abed, MD  atorvastatin (LIPITOR) 10 MG tablet Take 10 mg by mouth daily with breakfast. 09/14/11  Yes Luis Abed, MD  carvedilol (COREG) 12.5 MG tablet TAKE 1 AND 1/2 TABLETS     TWICE DAILY WITH A MEAL 02/13/12  Yes Luis Abed, MD  enalapril (VASOTEC) 20 MG tablet Take 1 tablet  (20 mg total) by mouth 2 (two) times daily. 09/14/11 09/13/12 Yes Luis Abed, MD  furosemide (LASIX) 20 MG tablet Take 20 mg by mouth daily with breakfast. 09/14/11  Yes  Luis Abed, MD  losartan (COZAAR) 50 MG tablet Take 50 mg by mouth daily with breakfast. 09/14/11 09/13/12 Yes Luis Abed, MD  potassium chloride SA (K-DUR,KLOR-CON) 20 MEQ tablet Take 20-40 mEq by mouth 3 (three) times daily. Patient takes 2 tablets every morning and 2 at lunch and 1 tablet at dinner 09/14/11  Yes Luis Abed, MD  methocarbamol (ROBAXIN) 500 MG tablet Take 1 tablet (500 mg total) by mouth every 6 (six) hours as needed. 02/28/12 03/09/12  Alexzandrew Perkins, PA  methylcellulose packet Take by mouth daily.    Historical Provider, MD  oxyCODONE (OXY IR/ROXICODONE) 5 MG immediate release tablet Take 1-2 tablets (5-10 mg total) by mouth every 4 (four) hours as needed for pain. 02/28/12 03/09/12  Alexzandrew Julien Girt, PA  rivaroxaban (XARELTO) 10 MG TABS tablet Take 1 tablet (10 mg total) by mouth daily with breakfast. Take Xarelto for two and a half more weeks, then discontinue Xarelto. 02/28/12   Alexzandrew Julien Girt, PA    Diet: Cardiac diet Activity:WBAT Follow-up:in 2 weeks Disposition - Home Discharged Condition: good   Discharge Orders    Future Appointments: Provider: Department: Dept Phone: Center:   04/17/2012 3:15 PM Luis Abed, MD [facility] [phone] [id number]     Future Orders Please Complete By Expires   Diet - low sodium heart healthy      Call MD / Call 911      Comments:   If you experience chest pain or shortness of breath, CALL 911 and be transported to the hospital emergency room.  If you develope a fever above 101 F, pus (white drainage) or increased drainage or redness at the wound, or calf pain, call your surgeon's office.   Discharge instructions      Comments:   Pick up stool softner and laxative for home. Do not submerge incision under water. May shower. Continue to  use ice for pain and swelling from surgery.  Take Xarelto for two and a half more weeks, then discontinue Xarelto.   Constipation Prevention      Comments:   Drink plenty of fluids.  Prune juice may be helpful.  You may use a stool softener, such as Colace (over the counter) 100 mg twice a day.  Use MiraLax (over the counter) for constipation as needed.   Increase activity slowly as tolerated      Patient may shower      Comments:   You may shower without a dressing once there is no drainage.  Do not wash over the wound.  If drainage remains, do not shower until drainage stops.   Driving restrictions      Comments:   No driving until released by the physician.   Lifting restrictions      Comments:   No lifting until released by the physician.   TED hose      Comments:   Use stockings (TED hose) for 3 weeks on both leg(s).  You may remove them at night for sleeping.   Change dressing      Comments:   Change dressing daily with sterile 4 x 4 inch gauze dressing and apply TED hose. Do not submerge the incision under water.   Do not put a pillow under the knee. Place it under the heel.      Do not sit on low chairs, stoools or toilet seats, as it may be difficult to get up from low surfaces  Medication List  As of 03/03/2012 12:06 PM   STOP taking these medications         aspirin 81 MG chewable tablet      Biotin 5000 MCG Caps      CALTRATE 600+D 600-400 MG-UNIT per tablet      cholecalciferol 1000 UNITS tablet      doxycycline 75 MG EC tablet      Evening Primrose Oil Caps      meloxicam 15 MG tablet      RECLAST 5 MG/100ML Soln      vitamin A 96045 UNIT capsule         TAKE these medications         amitriptyline 25 MG tablet   Commonly known as: ELAVIL   Take 25 mg by mouth at bedtime.      amLODipine 5 MG tablet   Commonly known as: NORVASC   Take 5 mg by mouth daily with breakfast.      atorvastatin 10 MG tablet   Commonly known as: LIPITOR   Take  10 mg by mouth daily with breakfast.      carvedilol 12.5 MG tablet   Commonly known as: COREG   TAKE 1 AND 1/2 TABLETS     TWICE DAILY WITH A MEAL      enalapril 20 MG tablet   Commonly known as: VASOTEC   Take 1 tablet (20 mg total) by mouth 2 (two) times daily.      furosemide 20 MG tablet   Commonly known as: LASIX   Take 20 mg by mouth daily with breakfast.      losartan 50 MG tablet   Commonly known as: COZAAR   Take 50 mg by mouth daily with breakfast.      methocarbamol 500 MG tablet   Commonly known as: ROBAXIN   Take 1 tablet (500 mg total) by mouth every 6 (six) hours as needed.      methylcellulose packet   Take by mouth daily.      oxyCODONE 5 MG immediate release tablet   Commonly known as: Oxy IR/ROXICODONE   Take 1-2 tablets (5-10 mg total) by mouth every 4 (four) hours as needed for pain.      potassium chloride SA 20 MEQ tablet   Commonly known as: K-DUR,KLOR-CON   Take 20-40 mEq by mouth 3 (three) times daily. Patient takes 2 tablets every morning and 2 at lunch and 1 tablet at dinner      rivaroxaban 10 MG Tabs tablet   Commonly known as: XARELTO   Take 1 tablet (10 mg total) by mouth daily with breakfast. Take Xarelto for two and a half more weeks, then discontinue Xarelto.           Follow-up Information    Follow up with Loanne Drilling, MD. Schedule an appointment as soon as possible for a visit in 2 weeks.   Contact information:   [facility] [address] [place] [address] [phone]          Signed: Patrica Duel 03/03/2012, 12:06 PM

## 2012-03-04 DIAGNOSIS — M069 Rheumatoid arthritis, unspecified: Secondary | ICD-10-CM | POA: Diagnosis not present

## 2012-03-04 DIAGNOSIS — I10 Essential (primary) hypertension: Secondary | ICD-10-CM | POA: Diagnosis not present

## 2012-03-04 DIAGNOSIS — Z96659 Presence of unspecified artificial knee joint: Secondary | ICD-10-CM | POA: Diagnosis not present

## 2012-03-04 DIAGNOSIS — IMO0001 Reserved for inherently not codable concepts without codable children: Secondary | ICD-10-CM | POA: Diagnosis not present

## 2012-03-04 DIAGNOSIS — Z471 Aftercare following joint replacement surgery: Secondary | ICD-10-CM | POA: Diagnosis not present

## 2012-03-06 DIAGNOSIS — I10 Essential (primary) hypertension: Secondary | ICD-10-CM | POA: Diagnosis not present

## 2012-03-06 DIAGNOSIS — IMO0001 Reserved for inherently not codable concepts without codable children: Secondary | ICD-10-CM | POA: Diagnosis not present

## 2012-03-06 DIAGNOSIS — Z96659 Presence of unspecified artificial knee joint: Secondary | ICD-10-CM | POA: Diagnosis not present

## 2012-03-06 DIAGNOSIS — Z471 Aftercare following joint replacement surgery: Secondary | ICD-10-CM | POA: Diagnosis not present

## 2012-03-06 DIAGNOSIS — M069 Rheumatoid arthritis, unspecified: Secondary | ICD-10-CM | POA: Diagnosis not present

## 2012-03-07 DIAGNOSIS — IMO0001 Reserved for inherently not codable concepts without codable children: Secondary | ICD-10-CM | POA: Diagnosis not present

## 2012-03-07 DIAGNOSIS — I10 Essential (primary) hypertension: Secondary | ICD-10-CM | POA: Diagnosis not present

## 2012-03-07 DIAGNOSIS — M069 Rheumatoid arthritis, unspecified: Secondary | ICD-10-CM | POA: Diagnosis not present

## 2012-03-07 DIAGNOSIS — Z471 Aftercare following joint replacement surgery: Secondary | ICD-10-CM | POA: Diagnosis not present

## 2012-03-07 DIAGNOSIS — Z96659 Presence of unspecified artificial knee joint: Secondary | ICD-10-CM | POA: Diagnosis not present

## 2012-03-10 DIAGNOSIS — I10 Essential (primary) hypertension: Secondary | ICD-10-CM | POA: Diagnosis not present

## 2012-03-10 DIAGNOSIS — Z96659 Presence of unspecified artificial knee joint: Secondary | ICD-10-CM | POA: Diagnosis not present

## 2012-03-10 DIAGNOSIS — M069 Rheumatoid arthritis, unspecified: Secondary | ICD-10-CM | POA: Diagnosis not present

## 2012-03-10 DIAGNOSIS — IMO0001 Reserved for inherently not codable concepts without codable children: Secondary | ICD-10-CM | POA: Diagnosis not present

## 2012-03-10 DIAGNOSIS — Z471 Aftercare following joint replacement surgery: Secondary | ICD-10-CM | POA: Diagnosis not present

## 2012-03-12 DIAGNOSIS — Z471 Aftercare following joint replacement surgery: Secondary | ICD-10-CM | POA: Diagnosis not present

## 2012-03-12 DIAGNOSIS — IMO0001 Reserved for inherently not codable concepts without codable children: Secondary | ICD-10-CM | POA: Diagnosis not present

## 2012-03-12 DIAGNOSIS — Z96659 Presence of unspecified artificial knee joint: Secondary | ICD-10-CM | POA: Diagnosis not present

## 2012-03-12 DIAGNOSIS — M069 Rheumatoid arthritis, unspecified: Secondary | ICD-10-CM | POA: Diagnosis not present

## 2012-03-12 DIAGNOSIS — I10 Essential (primary) hypertension: Secondary | ICD-10-CM | POA: Diagnosis not present

## 2012-03-13 DIAGNOSIS — M069 Rheumatoid arthritis, unspecified: Secondary | ICD-10-CM | POA: Diagnosis not present

## 2012-03-13 DIAGNOSIS — IMO0001 Reserved for inherently not codable concepts without codable children: Secondary | ICD-10-CM | POA: Diagnosis not present

## 2012-03-13 DIAGNOSIS — Z471 Aftercare following joint replacement surgery: Secondary | ICD-10-CM | POA: Diagnosis not present

## 2012-03-13 DIAGNOSIS — I10 Essential (primary) hypertension: Secondary | ICD-10-CM | POA: Diagnosis not present

## 2012-03-13 DIAGNOSIS — Z96659 Presence of unspecified artificial knee joint: Secondary | ICD-10-CM | POA: Diagnosis not present

## 2012-03-14 DIAGNOSIS — Z96659 Presence of unspecified artificial knee joint: Secondary | ICD-10-CM | POA: Diagnosis not present

## 2012-03-14 DIAGNOSIS — Z471 Aftercare following joint replacement surgery: Secondary | ICD-10-CM | POA: Diagnosis not present

## 2012-03-14 DIAGNOSIS — M069 Rheumatoid arthritis, unspecified: Secondary | ICD-10-CM | POA: Diagnosis not present

## 2012-03-14 DIAGNOSIS — I10 Essential (primary) hypertension: Secondary | ICD-10-CM | POA: Diagnosis not present

## 2012-03-14 DIAGNOSIS — IMO0001 Reserved for inherently not codable concepts without codable children: Secondary | ICD-10-CM | POA: Diagnosis not present

## 2012-03-16 NOTE — H&P (Signed)
[name], [place] Physician Assistant Signed  H&P 02/24/2012 3:11 PM  Related encounter: Orders Only from 02/24/2012 in [facility]   Alexis [name]  DOB: [birth year]  Married / Language: English / Race: White / Female  Date of Admission: 02/25/2012  Chief Complaint: Right Knee Pain  History of Present Illness  The patient is a 71 year old female who comes in for a preoperative History and Physical. The patient is scheduled for a right total knee arthroplasty to be performed by Dr. Gus Rankin. Aluisio, MD at [facility] on 02/25/2012.  The right knee continues to be very problematic for her. Not only is she having pain, she has a lot of dysfunction and difficulty doing activities. It is as bad as the left knee was prior to when we replaced that. She has done extremely well with the left knee replacement. Now she is ready to proceed with surgery.  They have been treated conservatively in the past for the above stated problem and despite conservative measures, they continue to have progressive pain and severe functional limitations and dysfunction. They have failed non-operative management. It is felt that they would benefit from undergoing total joint replacement. Risks and benefits of the procedure have been discussed with the patient and they elect to proceed with surgery. There are no active contraindications to surgery such as ongoing infection or rapidly progressive neurological disease.  Allergies  No Known Drug Allergies.  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 mother, father, sister, grandfather mothers side and grandfather fathers side  Osteoarthritis. father and sister  Cancer.  grandmother fathers side  Cerebrovascular Accident. mother  Heart Disease. mother, father, grandfather mothers side and grandfather fathers side  Heart disease in female family member before age 50  Social History  Pain Contract. no  Tobacco use. never smoker  Number of flights of stairs before winded. greater than 5  Alcohol use. never consumed alcohol  Tobacco / smoke exposure. no  Previously in rehab. no  Drug/Alcohol Rehab (Currently). no  Marital status. married  Living situation. live with spouse  Illicit drug use. no  Current work status. retired  Copywriter, advertising. 3  Exercise. Exercises weekly; does gym / weights  Medication History  Enalapril Maleate (20MG  Tablet, Oral) Active.  Potassium Bromide Specific dose unknown - Active.  Norvasc ( Oral) Specific dose unknown - Active.  Coreg CR ( Oral) Specific dose unknown - Active.  Furosemide ( Oral) Specific dose unknown - Active.  Past Surgical History  Total Knee Replacement. left  Tonsillectomy  Hemorrhoidectomy  Review of Systems  General ROS: negative  Respiratory ROS: no cough, shortness of breath, or wheezing  Cardiovascular ROS: no chest pain or dyspnea on exertion  Gastrointestinal ROS: no abdominal pain, change in bowel habits, or black or bloody stools  Musculoskeletal ROS: positive for - joint pain and joint stiffness  Neurological ROS: negative  Vitals  Weight: 169 lb Height: 64 in  Body Surface Area: 1.86 m Body Mass Index: 29.01 kg/m  Pulse: 64 (Regular) Resp.: 12 (Unlabored)  BP: 132/74 (Sitting, Left Arm, Standard)  Physical Exam  The physical exam findings are as follows:  Patient is a 71 year old female with continued right knee pain.  General  Mental Status - Alert, cooperative and good historian. General Appearance - pleasant and Anxious. Not in acute distress. Orientation - Oriented X3. Build & Nutrition - Well nourished and Well developed.  Head and Neck  Head - normocephalic, atraumatic .  Neck    Global Assessment - supple. no bruit auscultated on the right and no bruit auscultated on the left.  Eye  Pupil - Bilateral - Regular and Round. Note: glasses  Motion - Bilateral - EOMI.  Chest and Lung Exam  Auscultation:  Breath sounds: - clear at anterior chest wall and - clear at posterior chest wall.  Adventitious sounds: - No Adventitious sounds.  Cardiovascular  Auscultation: Rhythm - Regular rate and rhythm. Heart Sounds - S1 WNL and S2 WNL.  Murmurs & Other Heart Sounds: Auscultation of the heart reveals - No Murmurs.  Abdomen  Palpation/Percussion: Tenderness - Abdomen is non-tender to palpation. Rigidity (guarding) - Abdomen is soft.  Auscultation: Auscultation of the abdomen reveals - Bowel sounds normal.  Female Genitourinary  Not done, not pertinent to present illness  Musculoskeletal  On exam, she is alert and oriented in no apparent distress. Evaluation of her right knee shows no effusion. There is slight varus deformity. Range is 5 to 120 with marked crepitus on range of motion. She is tender medial greater than lateral. There is no instability noted.  RADIOGRAPHS:  Review of her x-rays shows that she has endstage arthritis of the right knee, bone on bone medial and patellofemoral with some tibial subluxation.  Assessment & Plan  Osteoarthritis Right Knee (715.96)  Note: Patient is for a Right Total Knee Replacement by Dr. Lequita Halt.  PCP - Dr. Berniece Andreas  Signed electronically by Roberts Gaudy, PA-C   Routing History.Marland KitchenMarland Kitchen

## 2012-03-17 DIAGNOSIS — IMO0001 Reserved for inherently not codable concepts without codable children: Secondary | ICD-10-CM | POA: Diagnosis not present

## 2012-03-17 DIAGNOSIS — Z96659 Presence of unspecified artificial knee joint: Secondary | ICD-10-CM | POA: Diagnosis not present

## 2012-03-17 DIAGNOSIS — Z471 Aftercare following joint replacement surgery: Secondary | ICD-10-CM | POA: Diagnosis not present

## 2012-03-17 DIAGNOSIS — M069 Rheumatoid arthritis, unspecified: Secondary | ICD-10-CM | POA: Diagnosis not present

## 2012-03-17 DIAGNOSIS — I10 Essential (primary) hypertension: Secondary | ICD-10-CM | POA: Diagnosis not present

## 2012-03-20 ENCOUNTER — Ambulatory Visit (INDEPENDENT_AMBULATORY_CARE_PROVIDER_SITE_OTHER)
Admission: RE | Admit: 2012-03-20 | Discharge: 2012-03-20 | Disposition: A | Discharge status: Home / Self Care | Payer: Medicare Other | Source: Ambulatory Visit | Attending: Family | Admitting: Family

## 2012-03-20 ENCOUNTER — Ambulatory Visit (INDEPENDENT_AMBULATORY_CARE_PROVIDER_SITE_OTHER): Payer: Medicare Other | Admitting: Family

## 2012-03-20 ENCOUNTER — Encounter: Payer: Self-pay | Admitting: Family

## 2012-03-20 ENCOUNTER — Other Ambulatory Visit: Payer: Self-pay | Admitting: Family

## 2012-03-20 VITALS — BP 94/70 | HR 64 | Temp 98.1°F | Wt 162.0 lb

## 2012-03-20 DIAGNOSIS — M542 Cervicalgia: Secondary | ICD-10-CM

## 2012-03-20 DIAGNOSIS — IMO0002 Reserved for concepts with insufficient information to code with codable children: Secondary | ICD-10-CM

## 2012-03-20 DIAGNOSIS — M1710 Unilateral primary osteoarthritis, unspecified knee: Secondary | ICD-10-CM | POA: Diagnosis not present

## 2012-03-20 DIAGNOSIS — M47812 Spondylosis without myelopathy or radiculopathy, cervical region: Secondary | ICD-10-CM | POA: Diagnosis not present

## 2012-03-20 MED ORDER — METHYLPREDNISOLONE 4 MG PO KIT: PACK | ORAL | Status: AC

## 2012-03-20 NOTE — Progress Notes (Signed)
Subjective:    Patient ID: [name], female    DOB: 11-20-1940, 71 y.o.   MRN: [id number]  HPI 71 year old white female, nonsmoker, patient of Dr. Lovell Sheehan is in today with complaints of neck pain. She has a history of degenerative disc disease. Denies any injury. Pain is worse with movement. She rates it a 10 out of 10, describes is achy. Denies any fever, muscle aches or pain, no numbness or tingling. She's been taken Robaxin and oxycodone that she had for her right knee that was recently replaced. The medication is not helping her symptoms.   Review of Systems  Constitutional: Negative.   Respiratory: Negative.   Cardiovascular: Negative.   Musculoskeletal: Positive for arthralgias.       Neck pain  Skin: Negative.   Neurological: Negative.  Negative for light-headedness.  Hematological: Negative.   Psychiatric/Behavioral: Negative.    Past Medical History  Diagnosis Date  . Dyslipidemia   . Polymyalgia rheumatica   . HTN (hypertension)   . Mitral valve prolapse     echo, January, 2011, moderate prolapse with your leaflet, trivial MR   Antibiotic required for procedures  . Tricuspid regurgitation     mild to moderate, echo, January, 2011, PA pressure 38 mm mercury  . 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 Hyponatremia   . History of skin cancer   . Rheumatoid arthritis   . Chronic cystitis   . Hemorrhoids   . Hx of colonoscopy   . IBS (irritable bowel syndrome)   . History of colon polyps   . Ejection fraction     EF 65%, echo, 2011  . Potassium (K) deficiency     5 potassium requirement over time  . Syncope     August, 200 weight, dehydration  . 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 S/P knee surgery     clearance done January, 2012  . CIN I (cervical intraepithelial neoplasia I)   . Arthritis   . Osteoporosis   . PONV (postoperative nausea and vomiting)   . Heart murmur     MVP     . Cancer     hx of basal cell carcinoma     History   Social History  . Marital Status: Married    Spouse Name: N/A    Number of Children: 1  . Years of Education: N/A   Occupational History  . Homemaker    Social History Main Topics  . Smoking status: Never Smoker   . Smokeless tobacco: Never Used  . Alcohol Use: No  . Drug Use: No  . Sexually Active: No   Other Topics Concern  . Not on file   Social History Narrative   3 caffeine drinks daily MarriedRetired    Past Surgical History  Procedure Date  . Excisional hemorrhoidectomy 2003  . Mohs surgery 2011  . Knee surgery 2012    Left   . Colonoscopy 12/23/2003    normal (indications: prior adenomas and grandfather with colon cancer)  . Colposcopy   . Tonsillectomy   . 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 Heart attack Mother 61  . Hypertension Mother   . Heart disease Mother   . Stroke  Mother   . Heart attack Father 27  . Coronary artery disease Father     Multiple Family Members  . Hypertension Father   . Heart disease Father   . Esophageal cancer Paternal Grandmother   . Colon cancer Maternal Grandfather   . Heart disease Maternal Grandfather   . Irritable bowel syndrome Sister     More family members on father side of   . Hypertension Sister   . Heart disease Sister   . Breast cancer Paternal Aunt   . Heart disease Paternal Grandfather     Allergies  Allergen Reactions  . Codeine     Some sensitivity    Current Outpatient Prescriptions on File Prior to Visit  Medication Sig Dispense Refill  . amitriptyline (ELAVIL) 25 MG tablet Take 25 mg by mouth at bedtime.       Marland Kitchen amLODipine (NORVASC) 5 MG tablet Take 5 mg by mouth daily with breakfast.      . atorvastatin (LIPITOR) 10 MG tablet Take 10 mg by mouth daily with  breakfast.      . carvedilol (COREG) 12.5 MG tablet TAKE 1 AND 1/2 TABLETS     TWICE DAILY WITH A MEAL  270 tablet  1  . enalapril (VASOTEC) 20 MG tablet Take 1 tablet (20 mg total) by mouth 2 (two) times daily.  180 tablet  3  . furosemide (LASIX) 20 MG tablet Take 20 mg by mouth daily with breakfast.      . losartan (COZAAR) 50 MG tablet Take 50 mg by mouth daily with breakfast.      . methylcellulose packet Take by mouth daily.      . potassium chloride SA (K-DUR,KLOR-CON) 20 MEQ tablet Take 20-40 mEq by mouth 3 (three) times daily. Patient takes 2 tablets every morning and 2 at lunch and 1 tablet at dinner      . rivaroxaban (XARELTO) 10 MG TABS tablet Take 1 tablet (10 mg total) by mouth daily with breakfast. Take Xarelto for two and a half more weeks, then discontinue Xarelto.  18 tablet  0    BP 94/70  Pulse 64  Temp 98.1 F (36.7 C) (Oral)  Wt 162 lb (73.483 kg)chartand    Objective:   Physical Exam  Constitutional: She is oriented to person, place, and time. She appears well-developed and well-nourished.  Neck: Neck supple.       Decreased range of motion with flexion, extension, and rotation.  Cardiovascular: Normal rate, regular rhythm and normal heart sounds.   Pulmonary/Chest: Effort normal and breath sounds normal.  Musculoskeletal: She exhibits tenderness. She exhibits no edema.       Neck pain  Neurological: She is alert and oriented to person, place, and time. She displays normal reflexes. She exhibits normal muscle tone. Coordination normal.  Skin: Skin is warm and dry.  Psychiatric: She has a normal mood and affect.          Assessment & Plan:  Assessment: Neck pain, degenerative disc disease  Plan: Obtain an up-to-date x-ray of the C-spine. Medrol Dosepak as directed. Ice and heat to the affected area. Patient call the office symptoms worsen or persist. Recheck a schedule, and when necessary.

## 2012-03-20 NOTE — Addendum Note (Signed)
Addended by: Raj Janus on: 03/20/2012 02:50 PM   Modules accepted: Orders

## 2012-03-20 NOTE — Patient Instructions (Addendum)
Muscle Strain A muscle strain, or pulled muscle, occurs when a muscle is over-stretched. A small number of muscle fibers may also be torn. This is especially common in athletes. This happens when a sudden violent force placed on a muscle pushes it past its capacity. Usually, recovery from a pulled muscle takes 1 to 2 weeks. But complete healing will take 5 to 6 weeks. There are millions of muscle fibers. Following injury, your body will usually return to normal quickly. HOME CARE INSTRUCTIONS   While awake, apply ice to the sore muscle for 15 to 20 minutes each hour for the first 2 days. Put ice in a plastic bag and place a towel between the bag of ice and your skin.   Do not use the pulled muscle for several days. Do not use the muscle if you have pain.   You may wrap the injured area with an elastic bandage for comfort. Be careful not to bind it too tightly. This may interfere with blood circulation.   Only take over-the-counter or prescription medicines for pain, discomfort, or fever as directed by your caregiver. Do not use aspirin as this will increase bleeding (bruising) at injury site.   Warming up before exercise helps prevent muscle strains.  SEEK MEDICAL CARE IF:  There is increased pain or swelling in the affected area. MAKE SURE YOU:   Understand these instructions.   Will watch your condition.   Will get help right away if you are not doing well or get worse.  Document Released: 07/23/2005 Document Revised: 07/12/2011 Document Reviewed: 02/19/2007 Exit[employer] Patient Information 2012 [employer]. 

## 2012-03-25 DIAGNOSIS — M1710 Unilateral primary osteoarthritis, unspecified knee: Secondary | ICD-10-CM | POA: Diagnosis not present

## 2012-03-27 DIAGNOSIS — M1710 Unilateral primary osteoarthritis, unspecified knee: Secondary | ICD-10-CM | POA: Diagnosis not present

## 2012-04-01 DIAGNOSIS — Z96659 Presence of unspecified artificial knee joint: Secondary | ICD-10-CM | POA: Diagnosis not present

## 2012-04-01 DIAGNOSIS — M1710 Unilateral primary osteoarthritis, unspecified knee: Secondary | ICD-10-CM | POA: Diagnosis not present

## 2012-04-04 DIAGNOSIS — M1710 Unilateral primary osteoarthritis, unspecified knee: Secondary | ICD-10-CM | POA: Diagnosis not present

## 2012-04-08 DIAGNOSIS — M1710 Unilateral primary osteoarthritis, unspecified knee: Secondary | ICD-10-CM | POA: Diagnosis not present

## 2012-04-11 DIAGNOSIS — M1710 Unilateral primary osteoarthritis, unspecified knee: Secondary | ICD-10-CM | POA: Diagnosis not present

## 2012-04-14 DIAGNOSIS — M1710 Unilateral primary osteoarthritis, unspecified knee: Secondary | ICD-10-CM | POA: Diagnosis not present

## 2012-04-16 DIAGNOSIS — M1710 Unilateral primary osteoarthritis, unspecified knee: Secondary | ICD-10-CM | POA: Diagnosis not present

## 2012-04-17 ENCOUNTER — Ambulatory Visit (INDEPENDENT_AMBULATORY_CARE_PROVIDER_SITE_OTHER): Payer: Medicare Other | Admitting: Cardiology

## 2012-04-17 ENCOUNTER — Encounter: Payer: Self-pay | Admitting: Cardiology

## 2012-04-17 VITALS — BP 120/76 | HR 65 | Ht 64.0 in | Wt 141.0 lb

## 2012-04-17 DIAGNOSIS — I10 Essential (primary) hypertension: Secondary | ICD-10-CM

## 2012-04-17 DIAGNOSIS — R55 Syncope and collapse: Secondary | ICD-10-CM

## 2012-04-17 NOTE — Assessment & Plan Note (Signed)
She's had no recurrent syncope. No further workup.

## 2012-04-17 NOTE — Patient Instructions (Addendum)
Your physician recommends that you return for lab work in: fasting lipid when you can at the [place] office  Your physician wants you to follow-up in: 6 months.    You will receive a reminder letter in the mail two months in advance. If you don't receive a letter, please call our office to schedule the follow-up appointment.

## 2012-04-17 NOTE — Progress Notes (Signed)
HPI Patient is seen today for cardiology followup. She had a knee replacement and she looks great. She has lost some weight related to this.  Allergies  Allergen Reactions  . Codeine     Some sensitivity    Current Outpatient Prescriptions  Medication Sig Dispense Refill  . amitriptyline (ELAVIL) 25 MG tablet Take 25 mg by mouth at bedtime.       Marland Kitchen amLODipine (NORVASC) 5 MG tablet Take 5 mg by mouth daily with breakfast.      . aspirin 81 MG tablet Take 81 mg by mouth. 5 TIMES WEEKLY      . atorvastatin (LIPITOR) 10 MG tablet Take 10 mg by mouth daily with breakfast.      . Biotin 5000 MCG CAPS Take by mouth.      . Calcium Carbonate (CALTRATE 600 PO) Take by mouth.      . carvedilol (COREG) 12.5 MG tablet TAKE 1 AND 1/2 TABLETS     TWICE DAILY WITH A MEAL  270 tablet  1  . Cholecalciferol (D3-1000) 1000 UNITS capsule Take 1,000 Units by mouth daily.      . Doxycycline Hyclate (DORYX PO) Take by mouth. 3 times weekly      . enalapril (VASOTEC) 20 MG tablet Take 1 tablet (20 mg total) by mouth 2 (two) times daily.  180 tablet  3  . EVENING PRIMROSE OIL PO Take by mouth.      . furosemide (LASIX) 20 MG tablet Take 20 mg by mouth daily with breakfast.      . losartan (COZAAR) 50 MG tablet Take 50 mg by mouth daily with breakfast.      . meloxicam (MOBIC) 15 MG tablet Take 15 mg by mouth daily.      . methylcellulose packet Take by mouth daily.      . potassium chloride SA (K-DUR,KLOR-CON) 20 MEQ tablet Take 20-40 mEq by mouth 3 (three) times daily. Patient takes 2 tablets every morning and 2 at lunch and 1 tablet at dinner      . vitamin A 16109 UNIT capsule Take 10,000 Units by mouth daily.        History   Social History  . Marital Status: Married    Spouse Name: N/A    Number of Children: 1  . Years of Education: N/A   Occupational History  . Homemaker    Social History Main Topics  . Smoking status: Never Smoker   . Smokeless tobacco: Never Used  . Alcohol Use: No  .  Drug Use: No  . Sexually Active: No   Other Topics Concern  . Not on file   Social History Narrative   3 caffeine drinks daily MarriedRetired    Family History  Problem Relation Age of Onset  . Heart attack Mother 35  . Hypertension Mother   . Heart disease Mother   . Stroke Mother   . Heart attack Father 57  . Coronary artery disease Father     Multiple Family Members  . Hypertension Father   . Heart disease Father   . Esophageal cancer Paternal Grandmother   . Colon cancer Maternal Grandfather   . Heart disease Maternal Grandfather   . Irritable bowel syndrome Sister     More family members on father side of   . Hypertension Sister   . Heart disease Sister   . Breast cancer Paternal Aunt   . Heart disease Paternal Grandfather     Past Medical History  Diagnosis Date  . Dyslipidemia   . Polymyalgia rheumatica   . HTN (hypertension)   . Mitral valve prolapse     echo, January, 2011, moderate prolapse with your leaflet, trivial MR   Antibiotic required for procedures  . Tricuspid regurgitation     mild to moderate, echo, January, 2011, PA pressure 38 mm mercury  . 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 Hyponatremia   . History of skin cancer   . Rheumatoid arthritis   . Chronic cystitis   . Hemorrhoids   . Hx of colonoscopy   . IBS (irritable bowel syndrome)   . History of colon polyps   . Ejection fraction     EF 65%, echo, 2011  . Potassium (K) deficiency     5 potassium requirement over time  . Syncope     August, 200 weight, dehydration  . 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 S/P knee surgery     clearance done January, 2012  . CIN I (cervical intraepithelial neoplasia I)   . Arthritis   . Osteoporosis   . PONV (postoperative nausea and vomiting)   . Heart murmur     MVP   . Cancer     hx of basal cell carcinoma     Past Surgical History  Procedure Date  . Excisional  hemorrhoidectomy 2003  . Mohs surgery 2011  . Knee surgery 2012    Left   . Colonoscopy 12/23/2003    normal (indications: prior adenomas and grandfather with colon cancer)  . Colposcopy   . Tonsillectomy   . 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 All other systems are reviewed and are negative.  PHYSICAL EXAM Patient looks great. She is oriented to person time and place. Affect is normal. Lungs are clear. Respiratory effort is nonlabored. Cardiac exam reveals S1 and S2. There no clicks or significant murmurs. The abdomen is soft. There is no peripheral edema.  Filed Vitals:   04/17/12 1530  BP: 120/76  Pulse: 65  Height: 5\' 4"  (1.626 m)  Weight: 141 lb (63.957 kg)   EKG is done today and reviewed by me. She has old mild decrease anterior R wave progression. There is no change.  ASSESSMENT & PLAN

## 2012-04-17 NOTE — Assessment & Plan Note (Signed)
Blood pressure stable. No change in therapy. 

## 2012-04-22 NOTE — Addendum Note (Signed)
Addended by: Micki Riley C on: 04/22/2012 12:17 PM   Modules accepted: Orders

## 2012-04-25 DIAGNOSIS — D2360 Other benign neoplasm of skin of unspecified upper limb, including shoulder: Secondary | ICD-10-CM | POA: Diagnosis not present

## 2012-04-25 DIAGNOSIS — L719 Rosacea, unspecified: Secondary | ICD-10-CM | POA: Diagnosis not present

## 2012-04-25 DIAGNOSIS — L905 Scar conditions and fibrosis of skin: Secondary | ICD-10-CM | POA: Diagnosis not present

## 2012-04-25 DIAGNOSIS — L819 Disorder of pigmentation, unspecified: Secondary | ICD-10-CM | POA: Diagnosis not present

## 2012-04-25 DIAGNOSIS — Z85828 Personal history of other malignant neoplasm of skin: Secondary | ICD-10-CM | POA: Diagnosis not present

## 2012-04-25 DIAGNOSIS — L821 Other seborrheic keratosis: Secondary | ICD-10-CM | POA: Diagnosis not present

## 2012-05-08 ENCOUNTER — Telehealth: Payer: Self-pay | Admitting: *Deleted

## 2012-05-08 ENCOUNTER — Other Ambulatory Visit (INDEPENDENT_AMBULATORY_CARE_PROVIDER_SITE_OTHER): Payer: Medicare Other

## 2012-05-08 ENCOUNTER — Encounter: Payer: Self-pay | Admitting: *Deleted

## 2012-05-08 DIAGNOSIS — I10 Essential (primary) hypertension: Secondary | ICD-10-CM

## 2012-05-08 LAB — LIPID PANEL
Cholesterol: 164 mg/dL (ref 0–200)
HDL: 78.2 mg/dL (ref >39.00)
LDL Cholesterol: 69 mg/dL (ref 0–99)
Total CHOL/HDL Ratio: 2
Triglycerides: 82 mg/dL (ref 0.0–149.0)
VLDL: 16.4 mg/dL (ref 0.0–40.0)

## 2012-05-08 NOTE — Telephone Encounter (Signed)
Pt aware of cholesterol results. Copy mailed to her at her request. Mylo Red RN

## 2012-05-21 ENCOUNTER — Ambulatory Visit (INDEPENDENT_AMBULATORY_CARE_PROVIDER_SITE_OTHER): Payer: Medicare Other

## 2012-05-21 DIAGNOSIS — Z23 Encounter for immunization: Secondary | ICD-10-CM | POA: Diagnosis not present

## 2012-06-05 DIAGNOSIS — H40019 Open angle with borderline findings, low risk, unspecified eye: Secondary | ICD-10-CM | POA: Diagnosis not present

## 2012-07-10 ENCOUNTER — Other Ambulatory Visit: Payer: Self-pay

## 2012-07-10 MED ORDER — LOSARTAN POTASSIUM 50 MG PO TABS: 50.0000 mg | ORAL_TABLET | Freq: Every day | ORAL | Status: DC

## 2012-07-28 ENCOUNTER — Ambulatory Visit (INDEPENDENT_AMBULATORY_CARE_PROVIDER_SITE_OTHER): Payer: Medicare Other | Admitting: Obstetrics and Gynecology

## 2012-07-28 ENCOUNTER — Encounter: Payer: Self-pay | Admitting: Obstetrics and Gynecology

## 2012-07-28 VITALS — BP 130/70 | Ht 64.0 in | Wt 156.0 lb

## 2012-07-28 DIAGNOSIS — Z780 Asymptomatic menopausal state: Secondary | ICD-10-CM | POA: Diagnosis not present

## 2012-07-28 DIAGNOSIS — N952 Postmenopausal atrophic vaginitis: Secondary | ICD-10-CM

## 2012-07-28 DIAGNOSIS — N871 Moderate cervical dysplasia: Secondary | ICD-10-CM

## 2012-07-28 DIAGNOSIS — M810 Age-related osteoporosis without current pathological fracture: Secondary | ICD-10-CM

## 2012-07-28 NOTE — Patient Instructions (Addendum)
Call Sherrilyn Rist in May, 2014 to schedule IV Reclast in June, 2014. Schedule bone density and appointment with Dr. Adah Salvage in May, 2015 to decide about future treatment.

## 2012-07-28 NOTE — Progress Notes (Signed)
Patient came to see me today for further followup. For many years she was on hormone replacement therapy for menopausal symptoms. She did well on it but finally stopped it due to concern regarding longterm treatment side effects. Her menopausal symptoms have continued to resolve and she is fine without it. She does have atrophic vaginitis but is not sexually active due  to her husband's health and does not require treatment. After her HRT she developed bone loss and initially was treated with Fosamax. She continued to lose bone when it became generic. She has also had vertebral fractures and two stress fractures and although her worse T score is now -2.4 she is considered osteoporotic due to the fractures. She started IV Reclast in 2010. This spring we sent her to Dr. Adah Salvage in [place] to discuss continue treatment. She had a complete evaluation from him including a followup bone density. He recommended 2-3 more years of IV Reclast. She got her first of these in 2013 in June. She has had no further fractures. She has a history of cervical dysplasia of greater than 30 years ago. She continues to have normal Pap smears. Her last Pap smear was 2012. She is due for her yearly mammogram in January. She is having no vaginal bleeding. She is having no pelvic pain.  ROS: 12 system review done. Pertinent positives above.Other positives include hypertension,Skin cancer, osteoarthritis,And tricuspid regurgitation.  Physical examination:[name] present. HEENT within normal limits. Neck: Thyroid not large. No masses. Supraclavicular nodes: not enlarged. Breasts: Examined in both sitting and lying  position. No skin changes and no masses. Abdomen: Soft no guarding rebound or masses or hernia. Pelvic: External: Within normal limits. BUS: Within normal limits. Vaginal:within normal limits. Poor  estrogen effect. No evidence of cystocele rectocele or enterocele. Cervix: clean. Uterus: Normal size and shape. Adnexa:  No masses. Rectovaginal exam: Confirmatory and negative. Extremities: Within normal limits.  Assessment: #1. Osteoporosis with vertebral fracture and history of two stress fractures. #2. Atrophic vaginitis. #3. Menopausal symptoms now resolved. #4. CIN  Plan: Patient will call us in May to schedule IV Reclast in June. In May, 2015 patient will see Dr. Adah Salvage again for bone density and opinion regarding further treatment. Pap not done.The new Pap smear guidelines were discussed with the patient.

## 2012-08-04 DIAGNOSIS — Z1231 Encounter for screening mammogram for malignant neoplasm of breast: Secondary | ICD-10-CM | POA: Diagnosis not present

## 2012-08-05 ENCOUNTER — Encounter: Payer: Self-pay | Admitting: Gynecology

## 2012-08-08 DIAGNOSIS — M1710 Unilateral primary osteoarthritis, unspecified knee: Secondary | ICD-10-CM | POA: Diagnosis not present

## 2012-08-12 DIAGNOSIS — L570 Actinic keratosis: Secondary | ICD-10-CM | POA: Diagnosis not present

## 2012-08-12 DIAGNOSIS — L578 Other skin changes due to chronic exposure to nonionizing radiation: Secondary | ICD-10-CM | POA: Diagnosis not present

## 2012-08-12 DIAGNOSIS — D1801 Hemangioma of skin and subcutaneous tissue: Secondary | ICD-10-CM | POA: Diagnosis not present

## 2012-08-12 DIAGNOSIS — Z85828 Personal history of other malignant neoplasm of skin: Secondary | ICD-10-CM | POA: Diagnosis not present

## 2012-08-12 DIAGNOSIS — L259 Unspecified contact dermatitis, unspecified cause: Secondary | ICD-10-CM | POA: Diagnosis not present

## 2012-08-12 DIAGNOSIS — L821 Other seborrheic keratosis: Secondary | ICD-10-CM | POA: Diagnosis not present

## 2012-08-12 DIAGNOSIS — L719 Rosacea, unspecified: Secondary | ICD-10-CM | POA: Diagnosis not present

## 2012-08-15 ENCOUNTER — Other Ambulatory Visit: Payer: Self-pay

## 2012-08-15 MED ORDER — CARVEDILOL 12.5 MG PO TABS: 12.5000 mg | ORAL_TABLET | Freq: Two times a day (BID) | ORAL | Status: DC

## 2012-08-18 ENCOUNTER — Telehealth: Payer: Self-pay | Admitting: Cardiology

## 2012-08-18 NOTE — Telephone Encounter (Signed)
Pt needs refill of  Carvedilol called into [facility], was called to [facility] [address] and pt cxl that, also requesting all there other hear meds be called in as well, any questions call pt at [phone]

## 2012-08-19 MED ORDER — ENALAPRIL MALEATE 20 MG PO TABS: 20.0000 mg | ORAL_TABLET | Freq: Two times a day (BID) | ORAL | Status: DC

## 2012-08-19 MED ORDER — LOSARTAN POTASSIUM 50 MG PO TABS: 50.0000 mg | ORAL_TABLET | Freq: Every day | ORAL | Status: DC

## 2012-08-19 MED ORDER — ATORVASTATIN CALCIUM 10 MG PO TABS: 10.0000 mg | ORAL_TABLET | Freq: Every day | ORAL | Status: DC

## 2012-08-19 MED ORDER — FUROSEMIDE 20 MG PO TABS: 20.0000 mg | ORAL_TABLET | Freq: Every day | ORAL | Status: DC

## 2012-08-19 MED ORDER — POTASSIUM CHLORIDE CRYS ER 20 MEQ PO TBCR: EXTENDED_RELEASE_TABLET | ORAL | Status: DC

## 2012-08-19 MED ORDER — CARVEDILOL 12.5 MG PO TABS: 12.5000 mg | ORAL_TABLET | Freq: Two times a day (BID) | ORAL | Status: DC

## 2012-08-19 MED ORDER — AMLODIPINE BESYLATE 5 MG PO TABS: 5.0000 mg | ORAL_TABLET | Freq: Every day | ORAL | Status: DC

## 2012-09-29 DIAGNOSIS — K1370 Unspecified lesions of oral mucosa: Secondary | ICD-10-CM | POA: Diagnosis not present

## 2012-10-28 ENCOUNTER — Other Ambulatory Visit: Payer: Self-pay

## 2012-10-28 MED ORDER — FUROSEMIDE 20 MG PO TABS: 20.0000 mg | ORAL_TABLET | Freq: Every day | ORAL | Status: DC

## 2012-11-06 ENCOUNTER — Other Ambulatory Visit: Payer: Self-pay

## 2012-11-06 NOTE — Telephone Encounter (Signed)
furosemide (LASIX) 20 MG tablet 90 tablet 3 08/19/2012 10/28/2012 Sig - Route: Take 1 tablet (20 mg total) by mouth daily with breakfast. - Oral Class: Normal Authorizing Provider: Luis Abed, MD Ordering User: Sherri Bascom Levels

## 2012-12-03 DIAGNOSIS — H2510 Age-related nuclear cataract, unspecified eye: Secondary | ICD-10-CM | POA: Diagnosis not present

## 2012-12-03 DIAGNOSIS — H52209 Unspecified astigmatism, unspecified eye: Secondary | ICD-10-CM | POA: Diagnosis not present

## 2012-12-03 DIAGNOSIS — H40019 Open angle with borderline findings, low risk, unspecified eye: Secondary | ICD-10-CM | POA: Diagnosis not present

## 2012-12-05 ENCOUNTER — Encounter: Payer: Self-pay | Admitting: Internal Medicine

## 2012-12-05 ENCOUNTER — Ambulatory Visit (INDEPENDENT_AMBULATORY_CARE_PROVIDER_SITE_OTHER): Payer: Medicare Other | Admitting: Internal Medicine

## 2012-12-05 VITALS — BP 122/76 | HR 64 | Temp 98.2°F | Wt 147.0 lb

## 2012-12-05 DIAGNOSIS — R52 Pain, unspecified: Secondary | ICD-10-CM | POA: Diagnosis not present

## 2012-12-05 DIAGNOSIS — Z791 Long term (current) use of non-steroidal anti-inflammatories (NSAID): Secondary | ICD-10-CM | POA: Diagnosis not present

## 2012-12-05 DIAGNOSIS — R1013 Epigastric pain: Secondary | ICD-10-CM

## 2012-12-05 LAB — POCT URINALYSIS DIPSTICK
Bilirubin, UA: NEGATIVE
Blood, UA: NEGATIVE
Glucose, UA: NEGATIVE
Ketones, UA: NEGATIVE
Leukocytes, UA: NEGATIVE
Nitrite, UA: NEGATIVE
Protein, UA: NEGATIVE
Spec Grav, UA: 1.01
Urobilinogen, UA: 0.2
pH, UA: 5.5

## 2012-12-05 LAB — CBC WITH DIFFERENTIAL/PLATELET
Basophils Absolute: 0 10*3/uL (ref 0.0–0.1)
Basophils Relative: 0.4 % (ref 0.0–3.0)
Eosinophils Absolute: 0.1 10*3/uL (ref 0.0–0.7)
Eosinophils Relative: 0.8 % (ref 0.0–5.0)
HCT: 37.7 % (ref 36.0–46.0)
Hemoglobin: 12.5 g/dL (ref 12.0–15.0)
Lymphocytes Relative: 33.2 % (ref 12.0–46.0)
Lymphs Abs: 2.5 10*3/uL (ref 0.7–4.0)
MCHC: 33.2 g/dL (ref 30.0–36.0)
MCV: 82.9 fl (ref 78.0–100.0)
Monocytes Absolute: 0.7 10*3/uL (ref 0.1–1.0)
Monocytes Relative: 9.3 % (ref 3.0–12.0)
Neutro Abs: 4.3 10*3/uL (ref 1.4–7.7)
Neutrophils Relative %: 56.3 % (ref 43.0–77.0)
Platelets: 243 10*3/uL (ref 150.0–400.0)
RBC: 4.54 Mil/uL (ref 3.87–5.11)
RDW: 13.9 % (ref 11.5–14.6)
WBC: 7.6 10*3/uL (ref 4.5–10.5)

## 2012-12-05 MED ORDER — OMEPRAZOLE 20 MG PO CPDR: 20.0000 mg | DELAYED_RELEASE_CAPSULE | Freq: Every day | ORAL | Status: DC

## 2012-12-05 NOTE — Progress Notes (Signed)
Chief Complaint  Patient presents with  . Abdominal Pain    Started at 2 am.  Has taken Tums.    HPI: Patient comes in today for SDA for  new problem evaluation.  Was in her usual state of health until last night at 2 AM when she had the sudden onset of high epigastric pain that radiated to her back a level 8-10 severity lasting about 3 hours. It was associated with nausea wasn't positional couldn't get comfortable. She eventually took calms it eventually went away and now she just has a mild soreness.   Had a gb attack after child born.  Family history of gallbladder disease. Her mother had it young in her 38s when she was pregnant.  Ate  6 pm last night salmon   And spinach and  Corn  And broccholi   meloxicam usually takes before bed.  With citrocel. Takes doxycycline 3 times a week did not take it last night.  No history of active ulcer disease. Dizzy in am at times  ROS: See pertinent positives and negatives per HPI. No UTI symptoms current vomiting change in bowel habits blood in stool.  Past Medical History  Diagnosis Date  . Dyslipidemia   . Polymyalgia rheumatica   . HTN (hypertension)   . Mitral valve prolapse     echo, January, 2011, moderate prolapse with your leaflet, trivial MR   Antibiotic required for procedures  . Tricuspid regurgitation     mild to moderate, echo, January, 2011, PA pressure 38 mm mercury  . 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 Hyponatremia   . History of skin cancer   . Rheumatoid arthritis   . Chronic cystitis   . Hemorrhoids   . Hx of colonoscopy   . IBS (irritable bowel syndrome)   . History of colon polyps   . Ejection fraction     EF 65%, echo, 2011  . Potassium (K) deficiency     5 potassium requirement over time  . Syncope     August, 200 weight, dehydration  . 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 S/P knee surgery     clearance done January, 2012  .  CIN I (cervical intraepithelial neoplasia I)   . Arthritis   . Osteoporosis   . PONV (postoperative nausea and vomiting)   . Heart murmur     MVP   . Cancer     hx of basal cell carcinoma    Past Surgical History  Procedure Laterality Date  . Excisional hemorrhoidectomy  2003  . Mohs surgery  2011  . Knee surgery  2012    Left -Replacement  . Colonoscopy  12/23/2003    normal (indications: prior adenomas and grandfather with colon cancer)  . Colposcopy    . Tonsillectomy    . Total knee arthroplasty  02/25/2012    Procedure: TOTAL KNEE ARTHROPLASTY;  Surgeon: Loanne Drilling, MD;  Location: [facility];  Service: Orthopedics;  Laterality: Right;      Family History  Problem Relation Age of Onset  . Heart attack Mother 27  . Hypertension Mother   . Heart disease Mother   . Stroke Mother   . Heart attack Father 49  . Coronary artery disease Father     Multiple Family Members  . Hypertension Father   . Heart disease Father   . Esophageal cancer Paternal Grandmother   .  Colon cancer Maternal Grandfather   . Heart disease Maternal Grandfather   . Irritable bowel syndrome Sister     More family members on father side of   . Hypertension Sister   . Heart disease Sister   . Breast cancer Paternal Aunt     Age 87's  . Heart disease Paternal Grandfather     History   Social History  . Marital Status: Married    Spouse Name: N/A    Number of Children: 1  . Years of Education: N/A   Occupational History  . Homemaker    Social History Main Topics  . Smoking status: Never Smoker   . Smokeless tobacco: Never Used  . Alcohol Use: No  . Drug Use: No  . Sexually Active: No   Other Topics Concern  . None   Social History Narrative   3 caffeine drinks daily    Married   Retired          Outpatient Encounter Prescriptions as of 12/05/2012  Medication Sig Dispense Refill  . amitriptyline (ELAVIL) 25 MG tablet Take 25 mg by mouth at bedtime.       Marland Kitchen amLODipine  (NORVASC) 5 MG tablet Take 1 tablet (5 mg total) by mouth daily with breakfast.  90 tablet  3  . aspirin 81 MG tablet Take 81 mg by mouth. 5 TIMES WEEKLY      . atorvastatin (LIPITOR) 10 MG tablet Take 1 tablet (10 mg total) by mouth daily with breakfast.  90 tablet  3  . Biotin 5000 MCG CAPS Take by mouth.      . Calcium Carbonate (CALTRATE 600 PO) Take by mouth.      . carvedilol (COREG) 12.5 MG tablet Take 1 tablet (12.5 mg total) by mouth 2 (two) times daily with a meal.  180 tablet  3  . Cholecalciferol (D3-1000) 1000 UNITS capsule Take 1,000 Units by mouth daily.      . Doxycycline Hyclate (DORYX PO) Take by mouth. 3 times weekly      . enalapril (VASOTEC) 20 MG tablet Take 1 tablet (20 mg total) by mouth 2 (two) times daily.  180 tablet  3  . EVENING PRIMROSE OIL PO Take by mouth.      . furosemide (LASIX) 20 MG tablet Take 1 tablet (20 mg total) by mouth daily with breakfast.  90 tablet  2  . losartan (COZAAR) 50 MG tablet Take 1 tablet (50 mg total) by mouth daily with breakfast.  90 tablet  3  . meloxicam (MOBIC) 15 MG tablet Take 15 mg by mouth daily.      . methylcellulose packet Take by mouth daily.      . potassium chloride SA (K-DUR,KLOR-CON) 20 MEQ tablet Patient takes 2 tablets every morning and 2 at lunch and 1 tablet at dinner  320 tablet  3  . omeprazole (PRILOSEC) 20 MG capsule Take 1 capsule (20 mg total) by mouth daily.  30 capsule  0   No facility-administered encounter medications on file as of 12/05/2012.    EXAM:  BP 122/76  Pulse 64  Temp(Src) 98.2 F (36.8 C) (Oral)  Wt 147 lb (66.679 kg)  BMI 25.22 kg/m2  SpO2 98%  Body mass index is 25.22 kg/(m^2).  GENERAL: vitals reviewed and listed above, alert, oriented, appears well hydrated and in no acute distress  HEENT: atraumatic, conjunctiva  clear, no obvious abnormalities on inspection of external nose and ears OP : no lesion edema or  exudate   NECK: no obvious masses on inspection palpation no  adenopathy  LUNGS: clear to auscultation bilaterally, no wheezes, rales or rhonchi, good air movement  CV: HRRR, no clubbing cyanosis or  peripheral edema nl cap refill  Abdomen soft without organomegaly guarding or rebound no flank pain. Points to the area of high epigastrium with some minimal pain on palpation no rebound no masses. Negative psoas sign. MS: moves all extremities without noticeable focal  abnormality Skin no acute changes bruises or bleeding PSYCH: pleasant and cooperative, no obvious depression or anxiety  ASSESSMENT AND PLAN:  Discussed the following assessment and plan:  Acute epigastric pain - Consider biliary gastric esophageal does not sound vascular - Plan: Basic metabolic panel, CBC with Differential, Hepatic function panel, POCT urinalysis dipstick, US Abdomen Complete  NSAID long-term use She certainly on enough pills and medications that could cause symptoms her she's been on these a while and reviewed these with her and how she is taking them. Consider biliary pain with acute onset and resolution and family history of such although the symptoms are a bit high in the epigastrium.  Plan evaluation and followup and go from there we'll add Prilosec as she is on Mobic daily. -Patient advised to return or notify health care team  if symptoms worsen or persist or new concerns arise.  Patient Instructions  Lab work and urine today. Your pain could be from a gallbladder attack consider ulcers gastritis or esophageal spasm. For now would like you to add over-the-counter Prilosec to take one a day for the next 10-14 days. In the meantime you will be contact about getting an abdominal ultrasound to check for gallstones. Plan followup in 2 weeks or if recurring may need to seek care in the emergency room ;we may get Dr. Leone Payor involved     Neta Mends. Yvette Roark M.D.

## 2012-12-05 NOTE — Patient Instructions (Signed)
Lab work and urine today. Your pain could be from a gallbladder attack consider ulcers gastritis or esophageal spasm. For now would like you to add over-the-counter Prilosec to take one a day for the next 10-14 days. In the meantime you will be contact about getting an abdominal ultrasound to check for gallstones. Plan followup in 2 weeks or if recurring may need to seek care in the emergency room ;we may get Dr. Leone Payor involved

## 2012-12-08 LAB — HEPATIC FUNCTION PANEL
ALT: 34 U/L (ref 0–35)
AST: 48 U/L — ABNORMAL HIGH (ref 0–37)
Albumin: 4.1 g/dL (ref 3.5–5.2)
Alkaline Phosphatase: 95 U/L (ref 39–117)
Bilirubin, Direct: 0.2 mg/dL (ref 0.0–0.3)
Total Bilirubin: 0.9 mg/dL (ref 0.3–1.2)
Total Protein: 6.8 g/dL (ref 6.0–8.3)

## 2012-12-08 LAB — BASIC METABOLIC PANEL
BUN: 21 mg/dL (ref 6–23)
CO2: 26 mEq/L (ref 19–32)
Calcium: 9.7 mg/dL (ref 8.4–10.5)
Chloride: 105 mEq/L (ref 96–112)
Creatinine, Ser: 0.7 mg/dL (ref 0.4–1.2)
GFR: 90.03 mL/min (ref >60.00)
Glucose, Bld: 100 mg/dL — ABNORMAL HIGH (ref 70–99)
Potassium: 4.4 mEq/L (ref 3.5–5.1)
Sodium: 137 mEq/L (ref 135–145)

## 2012-12-11 ENCOUNTER — Ambulatory Visit
Admission: RE | Admit: 2012-12-11 | Discharge: 2012-12-11 | Disposition: A | Discharge status: Home / Self Care | Payer: Medicare Other | Source: Ambulatory Visit | Attending: Internal Medicine | Admitting: Internal Medicine

## 2012-12-11 DIAGNOSIS — R1013 Epigastric pain: Secondary | ICD-10-CM

## 2012-12-24 ENCOUNTER — Telehealth: Payer: Self-pay | Admitting: *Deleted

## 2012-12-24 DIAGNOSIS — M810 Age-related osteoporosis without current pathological fracture: Secondary | ICD-10-CM

## 2012-12-24 NOTE — Telephone Encounter (Signed)
LM for pt as she is due for Reclast after 01/09/13. Will need current labs.

## 2012-12-25 NOTE — Telephone Encounter (Signed)
Pt informed and will RTO labs 5/29 then will schedule Reclast for possibly 6/12 ~12pm. KW

## 2012-12-26 ENCOUNTER — Ambulatory Visit (INDEPENDENT_AMBULATORY_CARE_PROVIDER_SITE_OTHER): Payer: Medicare Other | Admitting: Internal Medicine

## 2012-12-26 ENCOUNTER — Encounter: Payer: Self-pay | Admitting: Internal Medicine

## 2012-12-26 VITALS — BP 118/68 | Temp 97.5°F | Wt 149.0 lb

## 2012-12-26 DIAGNOSIS — R52 Pain, unspecified: Secondary | ICD-10-CM

## 2012-12-26 DIAGNOSIS — R1013 Epigastric pain: Secondary | ICD-10-CM | POA: Diagnosis not present

## 2012-12-26 DIAGNOSIS — R7989 Other specified abnormal findings of blood chemistry: Secondary | ICD-10-CM | POA: Diagnosis not present

## 2012-12-26 DIAGNOSIS — Z87898 Personal history of other specified conditions: Secondary | ICD-10-CM

## 2012-12-26 DIAGNOSIS — I10 Essential (primary) hypertension: Secondary | ICD-10-CM

## 2012-12-26 DIAGNOSIS — R945 Abnormal results of liver function studies: Secondary | ICD-10-CM | POA: Insufficient documentation

## 2012-12-26 NOTE — Patient Instructions (Signed)
Get  Liver panel checked at your next blood tests. I will put in the order for this.  Probably not significant clinically ;your liver looks good on ultrasound.  Try taking the cozaar in the evening in case  All the meds at one time could give your transient dizziness.  Check Bp and pulses.   Will plan on having you see dr Leone Payor  If abd pain recurs.

## 2012-12-26 NOTE — Progress Notes (Signed)
Chief Complaint  Patient presents with  . 3 week follow up    HPI: Fu of abd pain  See last note  Since her last visit she's had no recurrence of her abdominal pain she's no longer on the omeprazole. Had lab work and ultrasound that she viewed on line. Lfts; never had abnormal liver tests no fever. See med list  Still gets some morning dizziness usually after her medications not related to exercise or anything specific no syncope neurologic symptoms. No palpitations racing heart. Her blood pressure has been okay.  She is currently on an ACE and an arm and a diuretic and potassium to control her blood pressure because of history of side effects of other medications ROS: See pertinent positives and negatives per HPI. No chest pain shortness of breath  Past Medical History  Diagnosis Date  . Dyslipidemia   . Polymyalgia rheumatica   . HTN (hypertension)   . Mitral valve prolapse     echo, January, 2011, moderate prolapse with your leaflet, trivial MR   Antibiotic required for procedures  . Tricuspid regurgitation     mild to moderate, echo, January, 2011, PA pressure 38 mm mercury  . 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 Hyponatremia   . History of skin cancer   . Rheumatoid arthritis   . Chronic cystitis   . Hemorrhoids   . Hx of colonoscopy   . IBS (irritable bowel syndrome)   . History of colon polyps   . Ejection fraction     EF 65%, echo, 2011  . Potassium (K) deficiency     5 potassium requirement over time  . Syncope     August, 200 weight, dehydration  . 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 S/P knee surgery     clearance done January, 2012  . CIN I (cervical intraepithelial neoplasia I)   . Arthritis   . Osteoporosis   . PONV (postoperative nausea and vomiting)   . Heart murmur     MVP   . Cancer     hx of basal cell carcinoma     Family History  Problem Relation Age of Onset  . Heart  attack Mother 3  . Hypertension Mother   . Heart disease Mother   . Stroke Mother   . Heart attack Father 2  . Coronary artery disease Father     Multiple Family Members  . Hypertension Father   . Heart disease Father   . Esophageal cancer Paternal Grandmother   . Colon cancer Maternal Grandfather   . Heart disease Maternal Grandfather   . Irritable bowel syndrome Sister     More family members on father side of   . Hypertension Sister   . Heart disease Sister   . Breast cancer Paternal Aunt     Age 50's  . Heart disease Paternal Grandfather     History   Social History  . Marital Status: Married    Spouse Name: N/A    Number of Children: 1  . Years of Education: N/A   Occupational History  . Homemaker    Social History Main Topics  . Smoking status: Never Smoker   . Smokeless tobacco: Never Used  . Alcohol Use: No  . Drug Use: No  . Sexually Active: No   Other Topics Concern  . None   Social History Narrative   3 caffeine  drinks daily    Married   Retired          Outpatient Encounter Prescriptions as of 12/26/2012  Medication Sig Dispense Refill  . amitriptyline (ELAVIL) 25 MG tablet Take 25 mg by mouth at bedtime.       Marland Kitchen amLODipine (NORVASC) 5 MG tablet Take 1 tablet (5 mg total) by mouth daily with breakfast.  90 tablet  3  . aspirin 81 MG tablet Take 81 mg by mouth. 5 TIMES WEEKLY      . atorvastatin (LIPITOR) 10 MG tablet Take 1 tablet (10 mg total) by mouth daily with breakfast.  90 tablet  3  . Biotin 5000 MCG CAPS Take by mouth.      . Calcium Carbonate (CALTRATE 600 PO) Take by mouth.      . carvedilol (COREG) 12.5 MG tablet Take 1 tablet (12.5 mg total) by mouth 2 (two) times daily with a meal.  180 tablet  3  . Cholecalciferol (D3-1000) 1000 UNITS capsule Take 1,000 Units by mouth daily.      . Doxycycline Hyclate (DORYX PO) Take by mouth. 3 times weekly      . enalapril (VASOTEC) 20 MG tablet Take 1 tablet (20 mg total) by mouth 2 (two)  times daily.  180 tablet  3  . EVENING PRIMROSE OIL PO Take by mouth.      . furosemide (LASIX) 20 MG tablet Take 1 tablet (20 mg total) by mouth daily with breakfast.  90 tablet  2  . losartan (COZAAR) 50 MG tablet Take 1 tablet (50 mg total) by mouth daily with breakfast.  90 tablet  3  . meloxicam (MOBIC) 15 MG tablet Take 15 mg by mouth daily.      . methylcellulose packet Take by mouth daily.      . potassium chloride SA (K-DUR,KLOR-CON) 20 MEQ tablet Patient takes 2 tablets every morning and 2 at lunch and 1 tablet at dinner  320 tablet  3  . omeprazole (PRILOSEC) 20 MG capsule Take 1 capsule (20 mg total) by mouth daily.  30 capsule  0   No facility-administered encounter medications on file as of 12/26/2012.    EXAM:  BP 118/68  Temp(Src) 97.5 F (36.4 C) (Oral)  Wt 149 lb (67.586 kg)  BMI 25.56 kg/m2  Body mass index is 25.56 kg/(m^2).  GENERAL: vitals reviewed and listed above, alert, oriented, appears well hydrated and in no acute distress  HEENT: atraumatic, conjunctiva  clear, no obvious abnormalities on inspection of external nose and ears  NECK: no obvious masses on inspection palpation   LUNGS: clear to auscultation bilaterally, no wheezes, rales or rhonchi, good air movement Abdomen:  Sof,t normal bowel sounds without hepatosplenomegaly, no guarding rebound or masses no CVA tenderness CV: HRRR, no gallops no clubbing cyanosis or  peripheral edema nl cap refill   MS: moves all extremities without noticeable focal  abnormality  PSYCH: pleasant and cooperative, no obvious depression or anxiety Lab Results  Component Value Date   WBC 7.6 12/05/2012   HGB 12.5 12/05/2012   HCT 37.7 12/05/2012   PLT 243.0 12/05/2012   GLUCOSE 100* 12/05/2012   CHOL 164 05/08/2012   TRIG 82.0 05/08/2012   HDL 78.20 05/08/2012   LDLCALC 69 05/08/2012   ALT 34 12/05/2012   AST 48* 12/05/2012   NA 137 12/05/2012   K 4.4 12/05/2012   CL 105 12/05/2012   CREATININE 0.7 12/05/2012   BUN 21 12/05/2012   CO2  26 12/05/2012   TSH 0.69 08/16/2011   INR 0.99 02/18/2012   HGBA1C 6.0 01/29/2007    ASSESSMENT AND PLAN:  Discussed the following assessment and plan:  Acute epigastric pain - no recurrence  [place] nl still oculd be biliary if recurs   Abnormal LFTs - minor alt  elevation many meds but new  nl [place] repeat next blood draw - Plan: Hepatic function panel  H/O dizziness - seems to be in am  ? realted to all meds at a time vs good exercises .  try spliting up meds if abnormal bp pulse consider med evaluation dr Myrtis Ser.   HTN (hypertension) - controlled    on multiple meds  per cards  Would not rule out gallbladder dysfunction if recurrent even though ultrasound normal currently no alarm symptoms  Dr. Myrtis Ser follows her lipids order put in for LFTs to be repeated in 4-6 months when she has her lipid panel drawn order placed for [facility] lab future order -Patient advised to return or notify health care team  if symptoms worsen or persist or new concerns arise.  Patient Instructions  Get  Liver panel checked at your next blood tests. I will put in the order for this.  Probably not significant clinically ;your liver looks good on ultrasound.  Try taking the cozaar in the evening in case  All the meds at one time could give your transient dizziness.  Check Bp and pulses.   Will plan on having you see dr Leone Payor  If abd pain recurs.      Neta Mends. Evrett Hakim M.D.

## 2013-01-01 ENCOUNTER — Other Ambulatory Visit: Payer: Medicare Other

## 2013-01-01 DIAGNOSIS — M810 Age-related osteoporosis without current pathological fracture: Secondary | ICD-10-CM

## 2013-01-01 LAB — CALCIUM: Calcium: 9.5 mg/dL (ref 8.4–10.5)

## 2013-01-01 LAB — CREATININE, SERUM: Creat: 0.73 mg/dL (ref 0.50–1.10)

## 2013-01-05 NOTE — Telephone Encounter (Signed)
Pt informed of Reclast infusion date 6/12 at 11am at [facility]. Instructions mailed to patient, reminded of the Tylenol ES tid starting the day before infusion for a total of 3 days, order faxed KW

## 2013-01-06 DIAGNOSIS — D692 Other nonthrombocytopenic purpura: Secondary | ICD-10-CM | POA: Diagnosis not present

## 2013-01-06 DIAGNOSIS — L919 Hypertrophic disorder of the skin, unspecified: Secondary | ICD-10-CM | POA: Diagnosis not present

## 2013-01-06 DIAGNOSIS — L909 Atrophic disorder of skin, unspecified: Secondary | ICD-10-CM | POA: Diagnosis not present

## 2013-01-06 DIAGNOSIS — L578 Other skin changes due to chronic exposure to nonionizing radiation: Secondary | ICD-10-CM | POA: Diagnosis not present

## 2013-01-06 DIAGNOSIS — L719 Rosacea, unspecified: Secondary | ICD-10-CM | POA: Diagnosis not present

## 2013-01-06 DIAGNOSIS — Z85828 Personal history of other malignant neoplasm of skin: Secondary | ICD-10-CM | POA: Diagnosis not present

## 2013-01-14 ENCOUNTER — Other Ambulatory Visit (HOSPITAL_COMMUNITY): Payer: Self-pay

## 2013-01-15 ENCOUNTER — Encounter (HOSPITAL_COMMUNITY)
Admission: RE | Admit: 2013-01-15 | Discharge: 2013-01-15 | Disposition: A | Discharge status: Home / Self Care | Payer: Medicare Other | Source: Ambulatory Visit | Attending: Gynecology | Admitting: Gynecology

## 2013-01-15 DIAGNOSIS — M810 Age-related osteoporosis without current pathological fracture: Secondary | ICD-10-CM | POA: Insufficient documentation

## 2013-01-15 MED ORDER — ZOLEDRONIC ACID 5 MG/100ML IV SOLN
5.0000 mg | Freq: Once | INTRAVENOUS | Status: AC
  Administered 2013-01-15: 5 mg via INTRAVENOUS

## 2013-01-15 MED ORDER — ZOLEDRONIC ACID 5 MG/100ML IV SOLN
INTRAVENOUS | Status: AC
  Filled 2013-01-15: qty 100

## 2013-01-27 ENCOUNTER — Ambulatory Visit (INDEPENDENT_AMBULATORY_CARE_PROVIDER_SITE_OTHER): Payer: Medicare Other | Admitting: Cardiology

## 2013-01-27 ENCOUNTER — Encounter: Payer: Self-pay | Admitting: Cardiology

## 2013-01-27 VITALS — BP 107/72 | HR 73 | Ht 64.0 in | Wt 140.1 lb

## 2013-01-27 DIAGNOSIS — Z87898 Personal history of other specified conditions: Secondary | ICD-10-CM

## 2013-01-27 DIAGNOSIS — I951 Orthostatic hypotension: Secondary | ICD-10-CM | POA: Diagnosis not present

## 2013-01-27 DIAGNOSIS — I10 Essential (primary) hypertension: Secondary | ICD-10-CM

## 2013-01-27 MED ORDER — CARVEDILOL 6.25 MG PO TABS: 6.2500 mg | ORAL_TABLET | Freq: Two times a day (BID) | ORAL | Status: DC

## 2013-01-27 MED ORDER — AMLODIPINE BESYLATE 2.5 MG PO TABS: 2.5000 mg | ORAL_TABLET | Freq: Every day | ORAL | Status: DC

## 2013-01-27 NOTE — Patient Instructions (Addendum)
Your physician has recommended you make the following change in your medication: decrease Norvasc to 2.5 mg at supper time daily, decrease Carvedilol to 6.25 mg twice, take Lasix after exercising daily and stop taking Cozaar.  Your physician has requested that you regularly monitor and record your blood pressure readings at home. Please use the same machine at the same time of day to check your readings and record them to bring to your follow-up visit. Please call Dr. Myrtis Ser Nurse, Larita Fife, on Monday to report blood pressure readings at [phone].  Your physician recommends that you schedule a follow-up appointment in: 6 weeks

## 2013-01-27 NOTE — Assessment & Plan Note (Signed)
There appears to be a mild component of orthostatic hypotension at this time. We will see how she does with the medicine changes.

## 2013-01-27 NOTE — Progress Notes (Signed)
HPI  Patient is seen to help assess morning dizziness. She is very active. She does have high blood pressure and is treated. Recently however she gets up in the morning. She to breakfast takes her medicines. That when she does exercise she feels very poorly. She's had dizziness. She's not had syncope. There has been no chest pain.  The patient tells me that the other day when she came home after feeling poorly her systolic blood pressure was 90.  Allergies  Allergen Reactions  . Codeine     Some sensitivity    Current Outpatient Prescriptions  Medication Sig Dispense Refill  . amitriptyline (ELAVIL) 25 MG tablet Take 25 mg by mouth at bedtime.       Marland Kitchen amLODipine (NORVASC) 2.5 MG tablet Take 1 tablet (2.5 mg total) by mouth daily with supper.  90 tablet  3  . aspirin 81 MG tablet Take 81 mg by mouth. 5 TIMES WEEKLY      . atorvastatin (LIPITOR) 10 MG tablet Take 10 mg by mouth at bedtime.      . Biotin 5000 MCG CAPS Take by mouth.      . Calcium Carbonate (CALTRATE 600 PO) Take by mouth.      . carvedilol (COREG) 6.25 MG tablet Take 1 tablet (6.25 mg total) by mouth 2 (two) times daily with a meal.  180 tablet  3  . Cholecalciferol (D3-1000) 1000 UNITS capsule Take 1,000 Units by mouth daily.      . Doxycycline Hyclate (DORYX PO) Take by mouth. 3 times weekly      . enalapril (VASOTEC) 20 MG tablet Take 1 tablet (20 mg total) by mouth 2 (two) times daily.  180 tablet  3  . EVENING PRIMROSE OIL PO Take by mouth.      . furosemide (LASIX) 20 MG tablet Take 1 tablet (20 mg total) by mouth daily with breakfast.  90 tablet  2  . meloxicam (MOBIC) 15 MG tablet Take 15 mg by mouth daily.      . methylcellulose packet Take by mouth daily.      . potassium chloride SA (K-DUR,KLOR-CON) 20 MEQ tablet Patient takes 2 tablets every morning and 2 at lunch and 1 tablet at dinner  320 tablet  3   No current facility-administered medications for this visit.    History   Social History  . Marital  Status: Married    Spouse Name: N/A    Number of Children: 1  . Years of Education: N/A   Occupational History  . Homemaker    Social History Main Topics  . Smoking status: Never Smoker   . Smokeless tobacco: Never Used  . Alcohol Use: No  . Drug Use: No  . Sexually Active: No   Other Topics Concern  . Not on file   Social History Narrative   3 caffeine drinks daily    Married   Retired          Family History  Problem Relation Age of Onset  . Heart attack Mother 54  . Hypertension Mother   . Heart disease Mother   . Stroke Mother   . Heart attack Father 47  . Coronary artery disease Father     Multiple Family Members  . Hypertension Father   . Heart disease Father   . Esophageal cancer Paternal Grandmother   . Colon cancer Maternal Grandfather   . Heart disease Maternal Grandfather   . Irritable bowel syndrome Sister  More family members on father side of   . Hypertension Sister   . Heart disease Sister   . Breast cancer Paternal Aunt     Age 57's  . Heart disease Paternal Grandfather     Past Medical History  Diagnosis Date  . Dyslipidemia   . Polymyalgia rheumatica   . HTN (hypertension)   . Mitral valve prolapse     echo, January, 2011, moderate prolapse with your leaflet, trivial MR   Antibiotic required for procedures  . Tricuspid regurgitation     mild to moderate, echo, January, 2011, PA pressure 38 mm mercury  . 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 Hyponatremia   . History of skin cancer   . Rheumatoid arthritis(714.0)   . Chronic cystitis   . Hemorrhoids   . Hx of colonoscopy   . IBS (irritable bowel syndrome)   . History of colon polyps   . Ejection fraction     EF 65%, echo, 2011  . Potassium (K) deficiency     5 potassium requirement over time  . Syncope     August, 200 weight, dehydration  . 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 S/P knee surgery      clearance done January, 2012  . CIN I (cervical intraepithelial neoplasia I)   . Arthritis   . Osteoporosis   . PONV (postoperative nausea and vomiting)   . Heart murmur     MVP   . Cancer     hx of basal cell carcinoma     Past Surgical History  Procedure Laterality Date  . Excisional hemorrhoidectomy  2003  . Mohs surgery  2011  . Knee surgery  2012    Left -Replacement  . Colonoscopy  12/23/2003    normal (indications: prior adenomas and grandfather with colon cancer)  . Colposcopy    . Tonsillectomy    . 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 Dyslipidemia     Priority: High  . Ejection fraction     Priority: High  . Potassium (K) deficiency     Priority: High  . H/O dizziness 12/26/2012  . Abnormal LFTs 12/26/2012  . NSAID long-term use 12/05/2012  . Acute epigastric pain 12/05/2012  . Pre-op evaluation 02/13/2012  . CIN I (cervical intraepithelial neoplasia I)   . Ringing in ears 05/09/2011  . HTN (hypertension)   . Mitral valve prolapse   . Tricuspid regurgitation   . Subdural hematoma   . Syncope   . Personal history of colonic polyps 02/06/2011  . Personal history of failed moderate sedation 02/06/2011  . RHINITIS 08/23/2010  . ARTHRITIS, KNEE 08/23/2010  . SKIN CANCER, HX OF 02/24/2010  . FX CLSD SKL VLT W/HEM NEC, CONCUSSION NOS 04/07/2007  . POLYMYALGIA RHEUMATICA 04/02/2007  . OSTEOPOROSIS 03/11/2007    ROS   Patient denies fever, chills, headache, sweats, rash, change in vision, change in hearing, chest pain, cough, nausea vomiting, urinary symptoms. All other systems are reviewed and are negative.  PHYSICAL EXAM  Patient is oriented to person time and place. Affect is normal. Very careful orthostatic blood pressure check was done. There was a mild component of  orthostasis. Lying blood pressure was  138/71. Her final standing blood pressure was 107/72. Her pulse rate stayed between 69 and 73. She did not have her symptoms with this.  Filed Vitals:   01/27/13 1553 01/27/13 1554 01/27/13 1555 01/27/13 1557  BP: 114/74 113/72 121/73 107/72  Pulse: 70 75 72 73  Height:      Weight:        EKG is done today and reviewed by me. She has normal sinus rhythm. There is no change in the QRS.  ASSESSMENT & PLAN By

## 2013-01-27 NOTE — Assessment & Plan Note (Signed)
This is her main complaint at this time. I suspect that there is an orthostatic component to it. I plan to lower her medications. Of course she does have a history of hypertension. I explained to her that the new guidelines state that it is okay to allow her pressure to drift up into the range of 150. I made some changes today to see how she feels. Her current amlodipine dose of 5 mg daily will be cut to 2.5 mg and she'll take this at suppertime. Her carvedilol dose will be cut from 12.5 mg twice a day to 6.25 twice a day. It is possible that she may have a component of chronotropic incompetence. Her Cozaar will be stopped. She'll remain on Vasotec. She will take her Lasix after she returns home from her exercise. She will check her pressure at home and call us and we will try to continue to modify this approach to see how she does.

## 2013-01-28 ENCOUNTER — Encounter: Payer: Self-pay | Admitting: Cardiology

## 2013-01-28 ENCOUNTER — Telehealth: Payer: Self-pay | Admitting: Cardiology

## 2013-01-28 NOTE — Telephone Encounter (Signed)
**Note De-Identified  Obfuscation** Pt is advised, per Dr Myrtis Ser, to take 12.5 mg of Carvedilol twice daily, she verbalized understanding.

## 2013-01-28 NOTE — Telephone Encounter (Signed)
New Problem  Pt is calling you regarding a medication.

## 2013-01-28 NOTE — Progress Notes (Signed)
   Be clarified medicine doses with the patient today. Up until yesterday she was taking 1-1/2 of her 12.5 mg tablets of carvedilol twice a day. As of today we have lowered this to 1 tablet of 12.5 mg carvedilol twice a day

## 2013-02-02 ENCOUNTER — Telehealth: Payer: Self-pay | Admitting: Cardiology

## 2013-02-02 NOTE — Telephone Encounter (Signed)
New Problem:    Patient called in stating that she was returning a call to you.  Please call back.

## 2013-02-02 NOTE — Telephone Encounter (Signed)
**Note De-Identified  Obfuscation** Pt is calling to report her BP readings since last OV on 6/24 with Dr. Myrtis Ser. At that visit pt was advised to stop taking Cozaar, decrease Norvasc to 2.5 mg daily and Carvedilol to 6.25 mg twice daily. BP are as follows:                AM                 PM 6/25       BP= 115/61   BP= 124/76 6/26       BP= 121/71   BP= 129/69 6/27       BP= 118/81   BP= 103/70 6/28       BP= 107/70   BP= 145/71 6/29       BP= 139/77   BP= 128/64 6/30       BP= 111/71 Pt state she is feeling better since medication changes.

## 2013-02-09 ENCOUNTER — Telehealth: Payer: Self-pay | Admitting: Cardiology

## 2013-02-09 ENCOUNTER — Other Ambulatory Visit: Payer: Self-pay

## 2013-02-09 MED ORDER — CARVEDILOL 12.5 MG PO TABS: 12.5000 mg | ORAL_TABLET | Freq: Two times a day (BID) | ORAL | Status: DC

## 2013-02-09 MED ORDER — AMLODIPINE BESYLATE 5 MG PO TABS: 5.0000 mg | ORAL_TABLET | Freq: Every day | ORAL | Status: DC

## 2013-02-09 NOTE — Telephone Encounter (Signed)
New problem   Pt need to discuss some medications concerns with nurse. Please call pt

## 2013-02-09 NOTE — Telephone Encounter (Signed)
**Note De-Identified  Obfuscation** Pt states that her BP was elevated over the weekend at 175/100 so she increased her Norvasc dose back to 5 mg daily. Per pt request RX for Carvedilol and Norvasc sent to [facility] to fill.

## 2013-02-12 DIAGNOSIS — M1710 Unilateral primary osteoarthritis, unspecified knee: Secondary | ICD-10-CM | POA: Diagnosis not present

## 2013-03-05 ENCOUNTER — Telehealth: Payer: Self-pay | Admitting: Internal Medicine

## 2013-03-05 DIAGNOSIS — M810 Age-related osteoporosis without current pathological fracture: Secondary | ICD-10-CM | POA: Diagnosis not present

## 2013-03-05 DIAGNOSIS — M159 Polyosteoarthritis, unspecified: Secondary | ICD-10-CM | POA: Diagnosis not present

## 2013-03-05 DIAGNOSIS — M353 Polymyalgia rheumatica: Secondary | ICD-10-CM | POA: Diagnosis not present

## 2013-03-05 NOTE — Telephone Encounter (Signed)
Error/njr °

## 2013-03-19 ENCOUNTER — Ambulatory Visit (INDEPENDENT_AMBULATORY_CARE_PROVIDER_SITE_OTHER): Payer: Medicare Other | Admitting: Cardiology

## 2013-03-19 ENCOUNTER — Encounter: Payer: Self-pay | Admitting: Cardiology

## 2013-03-19 VITALS — BP 130/75 | HR 68 | Ht 64.0 in | Wt 136.0 lb

## 2013-03-19 DIAGNOSIS — E785 Hyperlipidemia, unspecified: Secondary | ICD-10-CM

## 2013-03-19 DIAGNOSIS — Z87898 Personal history of other specified conditions: Secondary | ICD-10-CM | POA: Diagnosis not present

## 2013-03-19 DIAGNOSIS — I10 Essential (primary) hypertension: Secondary | ICD-10-CM | POA: Diagnosis not present

## 2013-03-19 NOTE — Assessment & Plan Note (Signed)
Blood pressure is controlled well on her current regimen. No change in therapy.

## 2013-03-19 NOTE — Progress Notes (Signed)
HPI  Patient is seen for followup. She is doing well. I saw her last January 27, 2013. At that time she was having significant warning dizziness and fatigue. This appeared to be related to blood pressure and fluid meds. We cut her medicines back. We also may plan for her to take her diuretic after she exercises. She then called back blood pressure values that were quite good. I reduced her medicines even further. She feels much better. One day she did have some increase in pressure. Her Norvasc dose was increased back from 2.5 up to 5 mg daily. She's feeling very well.  Allergies  Allergen Reactions  . Codeine     Some sensitivity    Current Outpatient Prescriptions  Medication Sig Dispense Refill  . amitriptyline (ELAVIL) 25 MG tablet Take 25 mg by mouth at bedtime.       Marland Kitchen amLODipine (NORVASC) 5 MG tablet Take 1 tablet (5 mg total) by mouth daily with supper.  90 tablet  3  . aspirin 81 MG tablet Take 81 mg by mouth. 5 TIMES WEEKLY      . atorvastatin (LIPITOR) 10 MG tablet Take 10 mg by mouth at bedtime.      . Biotin 5000 MCG CAPS Take by mouth.      . Calcium Carbonate (CALTRATE 600 PO) Take by mouth.      . carvedilol (COREG) 12.5 MG tablet Take 1 tablet (12.5 mg total) by mouth 2 (two) times daily with a meal.  180 tablet  3  . Cholecalciferol (D3-1000) 1000 UNITS capsule Take 1,000 Units by mouth daily.      . Doxycycline Hyclate (DORYX PO) Take by mouth. 3 times weekly      . enalapril (VASOTEC) 20 MG tablet Take 1 tablet (20 mg total) by mouth 2 (two) times daily.  180 tablet  3  . EVENING PRIMROSE OIL PO Take by mouth.      . furosemide (LASIX) 20 MG tablet Take 1 tablet (20 mg total) by mouth daily with breakfast.  90 tablet  2  . meloxicam (MOBIC) 15 MG tablet Take 15 mg by mouth daily.      . methylcellulose packet Take by mouth daily.      . potassium chloride SA (K-DUR,KLOR-CON) 20 MEQ tablet Patient takes 2 tablets every morning and 2 at lunch and 1 tablet at dinner  320  tablet  3   No current facility-administered medications for this visit.    History   Social History  . Marital Status: Married    Spouse Name: N/A    Number of Children: 1  . Years of Education: N/A   Occupational History  . Homemaker    Social History Main Topics  . Smoking status: Never Smoker   . Smokeless tobacco: Never Used  . Alcohol Use: No  . Drug Use: No  . Sexual Activity: No   Other Topics Concern  . Not on file   Social History Narrative   3 caffeine drinks daily    Married   Retired          Family History  Problem Relation Age of Onset  . Heart attack Mother 45  . Hypertension Mother   . Heart disease Mother   . Stroke Mother   . Heart attack Father 8  . Coronary artery disease Father     Multiple Family Members  . Hypertension Father   . Heart disease Father   . Esophageal cancer Paternal  Grandmother   . Colon cancer Maternal Grandfather   . Heart disease Maternal Grandfather   . Irritable bowel syndrome Sister     More family members on father side of   . Hypertension Sister   . Heart disease Sister   . Breast cancer Paternal Aunt     Age 94's  . Heart disease Paternal Grandfather     Past Medical History  Diagnosis Date  . Dyslipidemia   . Polymyalgia rheumatica   . HTN (hypertension)   . Mitral valve prolapse     echo, January, 2011, moderate prolapse with your leaflet, trivial MR   Antibiotic required for procedures  . Tricuspid regurgitation     mild to moderate, echo, January, 2011, PA pressure 38 mm mercury  . 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 Hyponatremia   . History of skin cancer   . Rheumatoid arthritis(714.0)   . Chronic cystitis   . Hemorrhoids   . Hx of colonoscopy   . IBS (irritable bowel syndrome)   . History of colon polyps   . Ejection fraction     EF 65%, echo, 2011  . Potassium (K) deficiency     5 potassium requirement over time  . Syncope     August, 200  weight, dehydration  . 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 S/P knee surgery     clearance done January, 2012  . CIN I (cervical intraepithelial neoplasia I)   . Arthritis   . Osteoporosis   . PONV (postoperative nausea and vomiting)   . Heart murmur     MVP   . Cancer     hx of basal cell carcinoma   . Orthostasis     Mild orthostatic change when she stands    Past Surgical History  Procedure Laterality Date  . Excisional hemorrhoidectomy  2003  . Mohs surgery  2011  . Knee surgery  2012    Left -Replacement  . Colonoscopy  12/23/2003    normal (indications: prior adenomas and grandfather with colon cancer)  . Colposcopy    . Tonsillectomy    . 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 Dyslipidemia     Priority: High  . Ejection fraction     Priority: High  . Potassium (K) deficiency     Priority: High  . Orthostasis   . H/O dizziness 12/26/2012  . Abnormal LFTs 12/26/2012  . NSAID long-term use 12/05/2012  . Acute epigastric pain 12/05/2012  . Pre-op evaluation 02/13/2012  . CIN I (cervical intraepithelial neoplasia I)   . Ringing in ears 05/09/2011  . HTN (hypertension)   . Mitral valve prolapse   . Tricuspid regurgitation   . Subdural hematoma   . Syncope   . Personal history of colonic polyps 02/06/2011  . Personal history of failed moderate sedation 02/06/2011  . RHINITIS 08/23/2010  . ARTHRITIS, KNEE 08/23/2010  . SKIN CANCER, HX OF 02/24/2010  . FX CLSD SKL VLT W/HEM NEC, CONCUSSION NOS 04/07/2007  . POLYMYALGIA RHEUMATICA 04/02/2007  . OSTEOPOROSIS 03/11/2007    ROS   Patient denies fever, chills, headache, sweats, rash, change in vision, change in hearing, chest pain, cough, nausea vomiting, urinary symptoms.  All other systems are  reviewed. She does mention some weight loss. She will plan to follow this and be in touch with her primary physician if he continues.  PHYSICAL EXAM  Filed Vitals:   03/19/13 1114  BP: 130/75  Pulse: 68  Height: 5\' 4"  (1.626 m)  Weight: 136 lb (61.689 kg)     ASSESSMENT & PLAN

## 2013-03-19 NOTE — Patient Instructions (Addendum)
Your physician recommends that you continue on your current medications as directed. Please refer to the Current Medication list given to you today.  Your physician wants you to follow-up in: 6 months. You will receive a reminder letter in the mail two months in advance. If you don't receive a letter, please call our office to schedule the follow-up appointment.  

## 2013-03-19 NOTE — Assessment & Plan Note (Signed)
The patient is feeling much better since her medicines were reduced and adjusted. She's not having the morning dizziness and fatigue. She is fully active. No further change in her medicines.

## 2013-05-06 DIAGNOSIS — L821 Other seborrheic keratosis: Secondary | ICD-10-CM | POA: Diagnosis not present

## 2013-05-06 DIAGNOSIS — Z85828 Personal history of other malignant neoplasm of skin: Secondary | ICD-10-CM | POA: Diagnosis not present

## 2013-05-06 DIAGNOSIS — L2089 Other atopic dermatitis: Secondary | ICD-10-CM | POA: Diagnosis not present

## 2013-05-06 DIAGNOSIS — L723 Sebaceous cyst: Secondary | ICD-10-CM | POA: Diagnosis not present

## 2013-05-06 DIAGNOSIS — L719 Rosacea, unspecified: Secondary | ICD-10-CM | POA: Diagnosis not present

## 2013-05-19 ENCOUNTER — Other Ambulatory Visit (INDEPENDENT_AMBULATORY_CARE_PROVIDER_SITE_OTHER): Payer: Medicare Other

## 2013-05-19 DIAGNOSIS — E785 Hyperlipidemia, unspecified: Secondary | ICD-10-CM

## 2013-05-19 LAB — HEPATIC FUNCTION PANEL
ALT: 24 U/L (ref 0–35)
AST: 20 U/L (ref 0–37)
Albumin: 3.5 g/dL (ref 3.5–5.2)
Alkaline Phosphatase: 65 U/L (ref 39–117)
Bilirubin, Direct: 0.1 mg/dL (ref 0.0–0.3)
Total Bilirubin: 0.6 mg/dL (ref 0.3–1.2)
Total Protein: 6.1 g/dL (ref 6.0–8.3)

## 2013-05-19 LAB — LIPID PANEL
Cholesterol: 112 mg/dL (ref 0–200)
HDL: 65.7 mg/dL (ref >39.00)
LDL Cholesterol: 32 mg/dL (ref 0–99)
Total CHOL/HDL Ratio: 2
Triglycerides: 72 mg/dL (ref 0.0–149.0)
VLDL: 14.4 mg/dL (ref 0.0–40.0)

## 2013-05-21 ENCOUNTER — Telehealth: Payer: Self-pay | Admitting: Cardiology

## 2013-05-21 NOTE — Telephone Encounter (Signed)
**Note De-identified  Obfuscation** Pt given lab results, she verbalized understanding. 

## 2013-05-21 NOTE — Telephone Encounter (Signed)
Follow up:  Pt states she is returning Lynn's call. Pt would like a call back.

## 2013-05-22 ENCOUNTER — Ambulatory Visit (INDEPENDENT_AMBULATORY_CARE_PROVIDER_SITE_OTHER): Payer: Medicare Other

## 2013-05-22 DIAGNOSIS — Z23 Encounter for immunization: Secondary | ICD-10-CM | POA: Diagnosis not present

## 2013-05-26 ENCOUNTER — Telehealth: Payer: Self-pay | Admitting: Cardiology

## 2013-05-26 NOTE — Telephone Encounter (Signed)
Spoke with patient and agreed to put another copy in the mail for her (recent lab results). Advised her that it goes through a [facility] with [facility] and may take a day or two more than regular mail. She verbalized appreciation and understanding.

## 2013-05-26 NOTE — Telephone Encounter (Signed)
Follow up       Pt calling about lab results being mail to her when she spoke to you last Thursday.   Pt still has not gotten it.

## 2013-06-17 ENCOUNTER — Inpatient Hospital Stay (HOSPITAL_COMMUNITY)
Admission: EM | Admit: 2013-06-17 | Discharge: 2013-06-20 | DRG: 281 | Disposition: A | Discharge status: Home / Self Care | Payer: Medicare Other | Attending: Cardiology | Admitting: Cardiology

## 2013-06-17 ENCOUNTER — Encounter (HOSPITAL_COMMUNITY): Payer: Self-pay | Admitting: Emergency Medicine

## 2013-06-17 ENCOUNTER — Encounter (HOSPITAL_COMMUNITY)
Admission: EM | Disposition: A | Discharge status: Home / Self Care | Payer: Self-pay | Source: Home / Self Care | Attending: Cardiology

## 2013-06-17 ENCOUNTER — Emergency Department (HOSPITAL_COMMUNITY): Payer: Medicare Other

## 2013-06-17 DIAGNOSIS — Z87820 Personal history of traumatic brain injury: Secondary | ICD-10-CM | POA: Diagnosis not present

## 2013-06-17 DIAGNOSIS — I4891 Unspecified atrial fibrillation: Secondary | ICD-10-CM | POA: Diagnosis not present

## 2013-06-17 DIAGNOSIS — I059 Rheumatic mitral valve disease, unspecified: Secondary | ICD-10-CM | POA: Diagnosis not present

## 2013-06-17 DIAGNOSIS — N3020 Other chronic cystitis without hematuria: Secondary | ICD-10-CM | POA: Diagnosis present

## 2013-06-17 DIAGNOSIS — I219 Acute myocardial infarction, unspecified: Secondary | ICD-10-CM

## 2013-06-17 DIAGNOSIS — Z79899 Other long term (current) drug therapy: Secondary | ICD-10-CM | POA: Diagnosis not present

## 2013-06-17 DIAGNOSIS — M810 Age-related osteoporosis without current pathological fracture: Secondary | ICD-10-CM | POA: Diagnosis present

## 2013-06-17 DIAGNOSIS — I369 Nonrheumatic tricuspid valve disorder, unspecified: Secondary | ICD-10-CM | POA: Diagnosis not present

## 2013-06-17 DIAGNOSIS — I2510 Atherosclerotic heart disease of native coronary artery without angina pectoris: Secondary | ICD-10-CM | POA: Diagnosis not present

## 2013-06-17 DIAGNOSIS — Z860100 Personal history of colon polyps, unspecified: Secondary | ICD-10-CM

## 2013-06-17 DIAGNOSIS — R9430 Abnormal result of cardiovascular function study, unspecified: Secondary | ICD-10-CM | POA: Diagnosis present

## 2013-06-17 DIAGNOSIS — F329 Major depressive disorder, single episode, unspecified: Secondary | ICD-10-CM | POA: Diagnosis present

## 2013-06-17 DIAGNOSIS — K589 Irritable bowel syndrome without diarrhea: Secondary | ICD-10-CM | POA: Diagnosis present

## 2013-06-17 DIAGNOSIS — F3289 Other specified depressive episodes: Secondary | ICD-10-CM | POA: Diagnosis present

## 2013-06-17 DIAGNOSIS — I079 Rheumatic tricuspid valve disease, unspecified: Secondary | ICD-10-CM | POA: Diagnosis present

## 2013-06-17 DIAGNOSIS — Z8249 Family history of ischemic heart disease and other diseases of the circulatory system: Secondary | ICD-10-CM

## 2013-06-17 DIAGNOSIS — Z96659 Presence of unspecified artificial knee joint: Secondary | ICD-10-CM

## 2013-06-17 DIAGNOSIS — M353 Polymyalgia rheumatica: Secondary | ICD-10-CM | POA: Diagnosis present

## 2013-06-17 DIAGNOSIS — Z803 Family history of malignant neoplasm of breast: Secondary | ICD-10-CM

## 2013-06-17 DIAGNOSIS — E785 Hyperlipidemia, unspecified: Secondary | ICD-10-CM

## 2013-06-17 DIAGNOSIS — E236 Other disorders of pituitary gland: Secondary | ICD-10-CM | POA: Diagnosis not present

## 2013-06-17 DIAGNOSIS — Z823 Family history of stroke: Secondary | ICD-10-CM | POA: Diagnosis not present

## 2013-06-17 DIAGNOSIS — F411 Generalized anxiety disorder: Secondary | ICD-10-CM | POA: Diagnosis present

## 2013-06-17 DIAGNOSIS — I5181 Takotsubo syndrome: Secondary | ICD-10-CM

## 2013-06-17 DIAGNOSIS — I214 Non-ST elevation (NSTEMI) myocardial infarction: Secondary | ICD-10-CM | POA: Diagnosis not present

## 2013-06-17 DIAGNOSIS — Z7982 Long term (current) use of aspirin: Secondary | ICD-10-CM

## 2013-06-17 DIAGNOSIS — Z85828 Personal history of other malignant neoplasm of skin: Secondary | ICD-10-CM

## 2013-06-17 DIAGNOSIS — S065X9A Traumatic subdural hemorrhage with loss of consciousness of unspecified duration, initial encounter: Secondary | ICD-10-CM | POA: Diagnosis present

## 2013-06-17 DIAGNOSIS — I10 Essential (primary) hypertension: Secondary | ICD-10-CM

## 2013-06-17 DIAGNOSIS — J9819 Other pulmonary collapse: Secondary | ICD-10-CM | POA: Diagnosis not present

## 2013-06-17 DIAGNOSIS — R51 Headache: Secondary | ICD-10-CM | POA: Diagnosis not present

## 2013-06-17 DIAGNOSIS — R079 Chest pain, unspecified: Secondary | ICD-10-CM | POA: Diagnosis not present

## 2013-06-17 DIAGNOSIS — J90 Pleural effusion, not elsewhere classified: Secondary | ICD-10-CM | POA: Diagnosis not present

## 2013-06-17 DIAGNOSIS — I341 Nonrheumatic mitral (valve) prolapse: Secondary | ICD-10-CM

## 2013-06-17 DIAGNOSIS — Z800 Family history of malignant neoplasm of digestive organs: Secondary | ICD-10-CM

## 2013-06-17 DIAGNOSIS — R072 Precordial pain: Secondary | ICD-10-CM | POA: Diagnosis not present

## 2013-06-17 HISTORY — DX: Atherosclerotic heart disease of native coronary artery without angina pectoris: I25.10

## 2013-06-17 HISTORY — DX: Basal cell carcinoma of skin of nose: C44.311

## 2013-06-17 HISTORY — DX: Takotsubo syndrome: I51.81

## 2013-06-17 HISTORY — DX: Unspecified atrial fibrillation: I48.91

## 2013-06-17 HISTORY — DX: Unspecified osteoarthritis, unspecified site: M19.90

## 2013-06-17 HISTORY — PX: CARDIAC CATHETERIZATION: SHX172

## 2013-06-17 HISTORY — PX: LEFT HEART CATHETERIZATION WITH CORONARY ANGIOGRAM: SHX5451

## 2013-06-17 LAB — PROTIME-INR
INR: 1.25 (ref 0.00–1.49)
Prothrombin Time: 15.4 seconds — ABNORMAL HIGH (ref 11.6–15.2)

## 2013-06-17 LAB — CBC WITH DIFFERENTIAL/PLATELET
Basophils Absolute: 0 10*3/uL (ref 0.0–0.1)
Basophils Relative: 0 % (ref 0–1)
Eosinophils Absolute: 0 10*3/uL (ref 0.0–0.7)
Eosinophils Relative: 1 % (ref 0–5)
HCT: 33.2 % — ABNORMAL LOW (ref 36.0–46.0)
Hemoglobin: 10.6 g/dL — ABNORMAL LOW (ref 12.0–15.0)
Lymphocytes Relative: 13 % (ref 12–46)
Lymphs Abs: 1.1 10*3/uL (ref 0.7–4.0)
MCH: 26.9 pg (ref 26.0–34.0)
MCHC: 31.9 g/dL (ref 30.0–36.0)
MCV: 84.3 fL (ref 78.0–100.0)
Monocytes Absolute: 0.9 10*3/uL (ref 0.1–1.0)
Monocytes Relative: 10 % (ref 3–12)
Neutro Abs: 6.7 10*3/uL (ref 1.7–7.7)
Neutrophils Relative %: 77 % (ref 43–77)
Platelets: 179 10*3/uL (ref 150–400)
RBC: 3.94 MIL/uL (ref 3.87–5.11)
RDW: 12.3 % (ref 11.5–15.5)
WBC: 8.8 10*3/uL (ref 4.0–10.5)

## 2013-06-17 LAB — POCT I-STAT TROPONIN I: Troponin i, poc: 0.44 ng/mL (ref 0.00–0.08)

## 2013-06-17 LAB — BASIC METABOLIC PANEL
BUN: 20 mg/dL (ref 6–23)
CO2: 20 mEq/L (ref 19–32)
Calcium: 9.4 mg/dL (ref 8.4–10.5)
Chloride: 107 mEq/L (ref 96–112)
Creatinine, Ser: 0.69 mg/dL (ref 0.50–1.10)
GFR calc Af Amer: >90 mL/min (ref >90)
GFR calc non Af Amer: 84 mL/min — ABNORMAL LOW (ref >90)
Glucose, Bld: 115 mg/dL — ABNORMAL HIGH (ref 70–99)
Potassium: 4.3 mEq/L (ref 3.5–5.1)
Sodium: 139 mEq/L (ref 135–145)

## 2013-06-17 LAB — CBC
HCT: 31.6 % — ABNORMAL LOW (ref 36.0–46.0)
Hemoglobin: 10.1 g/dL — ABNORMAL LOW (ref 12.0–15.0)
MCH: 27 pg (ref 26.0–34.0)
MCHC: 32 g/dL (ref 30.0–36.0)
MCV: 84.5 fL (ref 78.0–100.0)
Platelets: 175 10*3/uL (ref 150–400)
RBC: 3.74 MIL/uL — ABNORMAL LOW (ref 3.87–5.11)
RDW: 12.5 % (ref 11.5–15.5)
WBC: 12.8 10*3/uL — ABNORMAL HIGH (ref 4.0–10.5)

## 2013-06-17 LAB — MRSA PCR SCREENING: MRSA by PCR: NEGATIVE

## 2013-06-17 LAB — PRO B NATRIURETIC PEPTIDE: Pro B Natriuretic peptide (BNP): 11069 pg/mL — ABNORMAL HIGH (ref 0–125)

## 2013-06-17 LAB — CREATININE, SERUM
Creatinine, Ser: 0.8 mg/dL (ref 0.50–1.10)
GFR calc Af Amer: 83 mL/min — ABNORMAL LOW (ref >90)
GFR calc non Af Amer: 71 mL/min — ABNORMAL LOW (ref >90)

## 2013-06-17 LAB — TROPONIN I
Troponin I: 0.67 ng/mL (ref <0.30)
Troponin I: 0.97 ng/mL (ref <0.30)

## 2013-06-17 LAB — APTT: aPTT: 39 seconds — ABNORMAL HIGH (ref 24–37)

## 2013-06-17 LAB — D-DIMER, QUANTITATIVE (NOT AT ARMC): D-Dimer, Quant: 0.63 ug/mL-FEU — ABNORMAL HIGH (ref 0.00–0.48)

## 2013-06-17 SURGERY — LEFT HEART CATHETERIZATION WITH CORONARY ANGIOGRAM
Anesthesia: LOCAL

## 2013-06-17 MED ORDER — LIDOCAINE HCL (PF) 1 % IJ SOLN
INTRAMUSCULAR | Status: AC
  Filled 2013-06-17: qty 30

## 2013-06-17 MED ORDER — ATORVASTATIN CALCIUM 10 MG PO TABS
10.0000 mg | ORAL_TABLET | Freq: Every day | ORAL | Status: DC
  Administered 2013-06-17 – 2013-06-19 (×3): 10 mg via ORAL
  Filled 2013-06-17 (×4): qty 1

## 2013-06-17 MED ORDER — HEPARIN SODIUM (PORCINE) 1000 UNIT/ML IJ SOLN
INTRAMUSCULAR | Status: AC
  Filled 2013-06-17: qty 1

## 2013-06-17 MED ORDER — POTASSIUM CHLORIDE CRYS ER 20 MEQ PO TBCR: 20.0000 meq | EXTENDED_RELEASE_TABLET | Freq: Three times a day (TID) | ORAL | Status: DC

## 2013-06-17 MED ORDER — BIOTIN 5000 MCG PO CAPS: 1.0000 | ORAL_CAPSULE | Freq: Every day | ORAL | Status: DC

## 2013-06-17 MED ORDER — NITROGLYCERIN 0.2 MG/ML ON CALL CATH LAB
INTRAVENOUS | Status: AC
  Filled 2013-06-17: qty 1

## 2013-06-17 MED ORDER — METHYLCELLULOSE (LAXATIVE) PO PACK: 1.0000 | PACK | Freq: Every day | ORAL | Status: DC

## 2013-06-17 MED ORDER — POTASSIUM CHLORIDE CRYS ER 20 MEQ PO TBCR
40.0000 meq | EXTENDED_RELEASE_TABLET | Freq: Two times a day (BID) | ORAL | Status: DC
  Administered 2013-06-18 – 2013-06-20 (×6): 40 meq via ORAL
  Filled 2013-06-17 (×7): qty 2

## 2013-06-17 MED ORDER — FENTANYL CITRATE 0.05 MG/ML IJ SOLN
INTRAMUSCULAR | Status: AC
  Filled 2013-06-17: qty 2

## 2013-06-17 MED ORDER — CALCIUM CARBONATE 1500 (600 CA) MG PO TABS: 1.0000 | ORAL_TABLET | Freq: Every day | ORAL | Status: DC

## 2013-06-17 MED ORDER — ASPIRIN EC 81 MG PO TBEC
81.0000 mg | DELAYED_RELEASE_TABLET | Freq: Every day | ORAL | Status: DC
  Administered 2013-06-17 – 2013-06-20 (×4): 81 mg via ORAL
  Filled 2013-06-17 (×4): qty 1

## 2013-06-17 MED ORDER — ACETAMINOPHEN 325 MG PO TABS
650.0000 mg | ORAL_TABLET | ORAL | Status: DC | PRN
  Administered 2013-06-18 – 2013-06-20 (×3): 650 mg via ORAL
  Filled 2013-06-17 (×3): qty 2

## 2013-06-17 MED ORDER — HEPARIN (PORCINE) IN NACL 100-0.45 UNIT/ML-% IJ SOLN
700.0000 [IU]/h | Freq: Once | INTRAMUSCULAR | Status: AC
  Administered 2013-06-17: 700 [IU]/h via INTRAVENOUS
  Filled 2013-06-17: qty 250

## 2013-06-17 MED ORDER — ASPIRIN 81 MG PO TABS: 81.0000 mg | ORAL_TABLET | Freq: Every day | ORAL | Status: DC

## 2013-06-17 MED ORDER — NITROGLYCERIN 0.4 MG SL SUBL: 0.4000 mg | SUBLINGUAL_TABLET | SUBLINGUAL | Status: DC | PRN

## 2013-06-17 MED ORDER — ENALAPRIL MALEATE 20 MG PO TABS
20.0000 mg | ORAL_TABLET | Freq: Two times a day (BID) | ORAL | Status: DC
  Administered 2013-06-17 – 2013-06-20 (×6): 20 mg via ORAL
  Filled 2013-06-17 (×7): qty 1

## 2013-06-17 MED ORDER — MORPHINE SULFATE 2 MG/ML IJ SOLN
2.0000 mg | Freq: Once | INTRAMUSCULAR | Status: AC
  Administered 2013-06-17: 2 mg via INTRAVENOUS
  Filled 2013-06-17: qty 1

## 2013-06-17 MED ORDER — SODIUM CHLORIDE 0.9 % IJ SOLN
3.0000 mL | Freq: Two times a day (BID) | INTRAMUSCULAR | Status: DC
  Administered 2013-06-17 – 2013-06-19 (×4): 3 mL via INTRAVENOUS

## 2013-06-17 MED ORDER — TRIAMCINOLONE ACETONIDE 0.1 % EX CREA
1.0000 "application " | TOPICAL_CREAM | Freq: Two times a day (BID) | CUTANEOUS | Status: DC | PRN
  Administered 2013-06-18: 1 via TOPICAL
  Filled 2013-06-17: qty 15

## 2013-06-17 MED ORDER — VERAPAMIL HCL 2.5 MG/ML IV SOLN
INTRAVENOUS | Status: AC
  Filled 2013-06-17: qty 2

## 2013-06-17 MED ORDER — AMLODIPINE BESYLATE 5 MG PO TABS
5.0000 mg | ORAL_TABLET | Freq: Every day | ORAL | Status: DC
  Administered 2013-06-18 – 2013-06-20 (×3): 5 mg via ORAL
  Filled 2013-06-17 (×3): qty 1

## 2013-06-17 MED ORDER — POTASSIUM CHLORIDE CRYS ER 20 MEQ PO TBCR
20.0000 meq | EXTENDED_RELEASE_TABLET | Freq: Every day | ORAL | Status: DC
  Administered 2013-06-17 – 2013-06-20 (×4): 20 meq via ORAL
  Filled 2013-06-17 (×4): qty 1

## 2013-06-17 MED ORDER — SODIUM CHLORIDE 0.9 % IJ SOLN: 3.0000 mL | Freq: Two times a day (BID) | INTRAMUSCULAR | Status: DC

## 2013-06-17 MED ORDER — DIAZEPAM 5 MG PO TABS: 5.0000 mg | ORAL_TABLET | ORAL | Status: DC

## 2013-06-17 MED ORDER — CALCIUM CARBONATE ANTACID 500 MG PO CHEW
3.0000 | CHEWABLE_TABLET | Freq: Every day | ORAL | Status: DC
  Administered 2013-06-17 – 2013-06-20 (×4): 600 mg via ORAL
  Filled 2013-06-17 (×4): qty 3

## 2013-06-17 MED ORDER — ZOLPIDEM TARTRATE 5 MG PO TABS
5.0000 mg | ORAL_TABLET | Freq: Every evening | ORAL | Status: DC | PRN
  Administered 2013-06-17 – 2013-06-19 (×3): 5 mg via ORAL
  Filled 2013-06-17 (×3): qty 1

## 2013-06-17 MED ORDER — SODIUM CHLORIDE 0.9 % IV SOLN: 250.0000 mL | INTRAVENOUS | Status: DC | PRN

## 2013-06-17 MED ORDER — SODIUM CHLORIDE 0.9 % IV SOLN: 1.0000 mL/kg/h | INTRAVENOUS | Status: AC

## 2013-06-17 MED ORDER — VITAMIN D3 25 MCG (1000 UNIT) PO TABS
1000.0000 [IU] | ORAL_TABLET | Freq: Every day | ORAL | Status: DC
  Administered 2013-06-17 – 2013-06-20 (×4): 1000 [IU] via ORAL
  Filled 2013-06-17 (×4): qty 1

## 2013-06-17 MED ORDER — CHOLECALCIFEROL 25 MCG (1000 UT) PO CAPS: 1000.0000 [IU] | ORAL_CAPSULE | Freq: Every day | ORAL | Status: DC

## 2013-06-17 MED ORDER — SODIUM CHLORIDE 0.9 % IJ SOLN: 3.0000 mL | INTRAMUSCULAR | Status: DC | PRN

## 2013-06-17 MED ORDER — ALPRAZOLAM 0.25 MG PO TABS: 0.2500 mg | ORAL_TABLET | Freq: Two times a day (BID) | ORAL | Status: DC | PRN

## 2013-06-17 MED ORDER — PSYLLIUM 95 % PO PACK
1.0000 | PACK | Freq: Every day | ORAL | Status: DC
  Administered 2013-06-18: 1 via ORAL
  Administered 2013-06-20: 11:00:00 via ORAL
  Filled 2013-06-17 (×4): qty 1

## 2013-06-17 MED ORDER — METOPROLOL TARTRATE 1 MG/ML IV SOLN
2.5000 mg | Freq: Once | INTRAVENOUS | Status: AC
  Administered 2013-06-17: 2.5 mg via INTRAVENOUS
  Filled 2013-06-17: qty 5

## 2013-06-17 MED ORDER — HEPARIN SODIUM (PORCINE) 5000 UNIT/ML IJ SOLN
60.0000 [IU]/kg | Freq: Once | INTRAMUSCULAR | Status: AC
  Administered 2013-06-17: 3600 [IU] via INTRAVENOUS
  Filled 2013-06-17: qty 1

## 2013-06-17 MED ORDER — NITROGLYCERIN IN D5W 200-5 MCG/ML-% IV SOLN: 10.0000 ug/min | INTRAVENOUS | Status: DC

## 2013-06-17 MED ORDER — ONDANSETRON HCL 4 MG/2ML IJ SOLN
4.0000 mg | Freq: Once | INTRAMUSCULAR | Status: AC
  Administered 2013-06-17: 4 mg via INTRAVENOUS
  Filled 2013-06-17: qty 2

## 2013-06-17 MED ORDER — HEPARIN (PORCINE) IN NACL 2-0.9 UNIT/ML-% IJ SOLN
INTRAMUSCULAR | Status: AC
  Filled 2013-06-17: qty 1500

## 2013-06-17 MED ORDER — MIDAZOLAM HCL 2 MG/2ML IJ SOLN
INTRAMUSCULAR | Status: AC
  Filled 2013-06-17: qty 2

## 2013-06-17 MED ORDER — HEPARIN SODIUM (PORCINE) 5000 UNIT/ML IJ SOLN
5000.0000 [IU] | Freq: Three times a day (TID) | INTRAMUSCULAR | Status: DC
  Administered 2013-06-17 – 2013-06-18 (×2): 5000 [IU] via SUBCUTANEOUS

## 2013-06-17 MED ORDER — NITROGLYCERIN 2 % TD OINT
1.0000 [in_us] | TOPICAL_OINTMENT | Freq: Once | TRANSDERMAL | Status: AC
  Administered 2013-06-17: 1 [in_us] via TOPICAL
  Filled 2013-06-17: qty 1

## 2013-06-17 MED ORDER — ONDANSETRON HCL 4 MG/2ML IJ SOLN: 4.0000 mg | Freq: Four times a day (QID) | INTRAMUSCULAR | Status: DC | PRN

## 2013-06-17 MED ORDER — CARVEDILOL 12.5 MG PO TABS
12.5000 mg | ORAL_TABLET | Freq: Two times a day (BID) | ORAL | Status: DC
  Administered 2013-06-18 – 2013-06-20 (×6): 12.5 mg via ORAL
  Filled 2013-06-17 (×7): qty 1

## 2013-06-17 MED ORDER — CLINDAMYCIN PHOSPHATE 1 % EX LOTN: 1.0000 "application " | TOPICAL_LOTION | Freq: Every day | CUTANEOUS | Status: DC

## 2013-06-17 MED ORDER — AMITRIPTYLINE HCL 25 MG PO TABS
25.0000 mg | ORAL_TABLET | Freq: Every day | ORAL | Status: DC
  Administered 2013-06-17 – 2013-06-18 (×2): 25 mg via ORAL
  Filled 2013-06-17 (×4): qty 1

## 2013-06-17 MED ORDER — FUROSEMIDE 20 MG PO TABS
20.0000 mg | ORAL_TABLET | Freq: Every day | ORAL | Status: DC
  Administered 2013-06-18 – 2013-06-20 (×3): 20 mg via ORAL
  Filled 2013-06-17 (×4): qty 1

## 2013-06-17 MED ORDER — SODIUM CHLORIDE 0.9 % IV SOLN: 1.0000 mL/kg/h | INTRAVENOUS | Status: DC

## 2013-06-17 NOTE — Progress Notes (Signed)
ANTICOAGULATION CONSULT NOTE - Initial Consult  Pharmacy Consult for heparin Indication: chest pain/ACS  Allergies  Allergen Reactions  . Codeine Nausea And Vomiting    Some sensitivity    Patient Measurements: Height: 5\' 4"  (162.6 cm) Weight: 132 lb (59.875 kg) IBW/kg (Calculated) : 54.7 Heparin Dosing Weight: 60 kg  Vital Signs: Temp: 98.7 F (37.1 C) (11/12 0933) Temp src: Oral (11/12 0933) BP: 134/69 mmHg (11/12 0933) Pulse Rate: 98 (11/12 0933)  Labs:  Recent Labs  06/17/13 0939  HGB 10.6*  HCT 33.2*  PLT 179    Estimated Creatinine Clearance: 54.1 ml/min (by C-G formula based on Cr of 0.73).   Medical History: Past Medical History  Diagnosis Date  . Dyslipidemia   . Polymyalgia rheumatica   . HTN (hypertension)   . Mitral valve prolapse     echo, January, 2011, moderate prolapse with your leaflet, trivial MR   Antibiotic required for procedures  . Tricuspid regurgitation     mild to moderate, echo, January, 2011, PA pressure 38 mm mercury  . 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 Hyponatremia   . History of skin cancer   . Rheumatoid arthritis(714.0)   . Chronic cystitis   . Hemorrhoids   . Hx of colonoscopy   . IBS (irritable bowel syndrome)   . History of colon polyps   . Ejection fraction     EF 65%, echo, 2011  . Potassium (K) deficiency     5 potassium requirement over time  . Syncope     August, 200 weight, dehydration  . 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 S/P knee surgery     clearance done January, 2012  . CIN I (cervical intraepithelial neoplasia I)   . Arthritis   . Osteoporosis   . PONV (postoperative nausea and vomiting)   . Heart murmur     MVP   . Cancer     hx of basal cell carcinoma   . Orthostasis     Mild orthostatic change when she stands    Medications:  No anticoagulants  Assessment: 72 yo F to start heparin per pharmacy for ACS/Stemi.   Her wt it 60 kg. Her H/H is 10.6/32.2, pltc 179.  Her first troponin is 0.44.  EKG shows no dynamic changes.  Cards has been consulted.   Goal of Therapy:  Heparin level 0.3-0.7 units/ml Monitor platelets by anticoagulation protocol: Yes   Plan:  Heparin 3600 bolus and 700 units/hr ordered by EDP.  Doses are appropriate.  Give 3600 units bolus x 1 Start heparin infusion at 700 units/hr Check anti-Xa level in 8 hours and daily while on heparin Continue to monitor H&H and platelets  Herby Abraham, Pharm.D. [phone] 06/17/2013 10:49 AM

## 2013-06-17 NOTE — ED Notes (Signed)
Placed zoll pads on patient for pt is getting ready to go to the cath lab

## 2013-06-17 NOTE — ED Notes (Signed)
Repeat EKG done and given to Dr. Wilkie Aye. Patient states her pain changed from a dull sensation to a 7 in the past couple of minutes.

## 2013-06-17 NOTE — ED Notes (Signed)
Per pt, states she woke up this morning to chest pain in neck, radiating to central left chest and towards back. Denies SOB, dizziness, diaphoresis, denies N/V/D

## 2013-06-17 NOTE — ED Notes (Signed)
Phlebotomy at bedside.

## 2013-06-17 NOTE — ED Notes (Signed)
MD at bedside.  [name] PA with Cardiology at bedside.  Marchelle Folks RN from [employer] at bedside.

## 2013-06-17 NOTE — Interval H&P Note (Signed)
History and Physical Interval Note:  06/17/2013 12:54 PM  [name]  has presented today for surgery, with the diagnosis of urgent  The various methods of treatment have been discussed with the patient and family. After consideration of risks, benefits and other options for treatment, the patient has consented to  Procedure(s): LEFT HEART CATHETERIZATION WITH CORONARY ANGIOGRAM (N/A) as a surgical intervention .  The patient's history has been reviewed, patient examined, no change in status, stable for surgery.  I have reviewed the patient's chart and labs.  Questions were answered to the patient's satisfaction.   Cath Lab Visit (complete for each Cath Lab visit)  Clinical Evaluation Leading to the Procedure:   ACS: yes  Non-ACS:    Anginal Classification: CCS IV  Anti-ischemic medical therapy: No Therapy  Non-Invasive Test Results: No non-invasive testing performed  Prior CABG: No previous CABG        Theron Arista [facility] 06/17/2013 12:54 PM

## 2013-06-17 NOTE — ED Notes (Signed)
Patient transported to X-ray 

## 2013-06-17 NOTE — ED Notes (Signed)
Results of troponin given to primary nurse Christina on [address] [address]

## 2013-06-17 NOTE — Progress Notes (Signed)
PHARMACIST - PHYSICIAN ORDER COMMUNICATION  CONCERNING: P&T Medication Policy on Herbal Medications  DESCRIPTION:  This patient's order for:  Biotin  has been noted.  This product(s) is classified as an "herbal" or natural product. Due to a lack of definitive safety studies or FDA approval, nonstandard manufacturing practices, plus the potential risk of unknown drug-drug interactions while on inpatient medications, the Pharmacy and Therapeutics Committee does not permit the use of "herbal" or natural products of this type within [facility].   ACTION TAKEN: The pharmacy department is unable to verify this order at this time and your patient has been informed of this safety policy. Please reevaluate patient's clinical condition at discharge and address if the herbal or natural product(s) should be resumed at that time.  Tad Moore, BCPS  Clinical Pharmacist Pager [phone]  06/17/2013 6:13 PM

## 2013-06-17 NOTE — H&P (Signed)
History and Physical   Patient ID: [name] MRN: [id number], DOB/AGE: 1941/04/18 72 y.o. Date of Encounter: 06/17/2013  Primary Physician: Lorretta Harp, MD Primary Cardiologist: JK  Chief Complaint:  NSTEMI  HPI: [name] is a 72 y.o. female with no history of CAD. She is followed by Dr. Myrtis Ser for hypertension.   She woke up about 6 am today with pain in her left neck and shoulder. It was aching. She used a heating pad, with some improvement. She began feeling worse, the pain moved to the substernal area of her chest and she became nauseated, vomiting x 1. She may have been a little SOB.  She then called EMS and came to the ER. She took ASA 81 x 4 prior to EMS arrival. She received SL NTG x 1 en route to [facility], and got NTG paste in the ER. Initially a 5/10, she currently has pain at a 3/10. It is worse with deep inspiration, but does not change with palpation or cough. She has never had this before. She has no history of chest pain with activity. Her chest pain had initially improved but worsened acutely and her ECG has some minimal ST changes concerning for active ischemia.  Her initial enzymes were elevated but her ECG did not meet STEMI criteria. However, she has ongoing pain and there are evolving ST changes concerning for ischemia. Therefore, urgent cardiac catheterization was recommended. The patient and her husband agree.   Past Medical History  Diagnosis Date  . Dyslipidemia   . Polymyalgia rheumatica   . HTN (hypertension)   . Mitral valve prolapse     echo, January, 2011, moderate prolapse with your leaflet, trivial MR   Antibiotic required for procedures  . Tricuspid regurgitation     mild to moderate, echo, January, 2011, PA pressure 38 mm mercury  . 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 Hyponatremia   . History of skin cancer   . Rheumatoid arthritis(714.0)   . Chronic cystitis   . Hemorrhoids     . Hx of colonoscopy   . IBS (irritable bowel syndrome)   . History of colon polyps   . Ejection fraction     EF 65%, echo, 2011  . Potassium (K) deficiency     5 potassium requirement over time  . Syncope     August, 200 weight, dehydration  . 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 S/P knee surgery     clearance done January, 2012  . CIN I (cervical intraepithelial neoplasia I)   . Arthritis   . Osteoporosis   . PONV (postoperative nausea and vomiting)   . Heart murmur     MVP   . Cancer     hx of basal cell carcinoma   . Orthostasis     Mild orthostatic change when she stands    Surgical History:  Past Surgical History  Procedure Laterality Date  . Excisional hemorrhoidectomy  2003  . Mohs surgery  2011  . Knee surgery  2012    Left -Replacement  . Colonoscopy  12/23/2003    normal (indications: prior adenomas and grandfather with colon cancer)  . Colposcopy    . Tonsillectomy    . Total knee arthroplasty  02/25/2012    Procedure: TOTAL KNEE ARTHROPLASTY;  Surgeon: Loanne Drilling, MD;  Location: [facility];  Service: Orthopedics;  Laterality:  Right;     I have reviewed the patient's current medications. Prior to Admission medications   Medication Sig Start Date End Date Taking? Authorizing Provider  amitriptyline (ELAVIL) 25 MG tablet Take 25 mg by mouth at bedtime.    Yes Historical Provider, MD  amLODipine (NORVASC) 5 MG tablet Take 1 tablet (5 mg total) by mouth daily with supper. 02/09/13  Yes Luis Abed, MD  aspirin 81 MG tablet Take 81 mg by mouth. 5 TIMES WEEKLY   Yes Historical Provider, MD  atorvastatin (LIPITOR) 10 MG tablet Take 10 mg by mouth at bedtime. 08/19/12  Yes Luis Abed, MD  Biotin 5000 MCG CAPS Take 1 capsule by mouth daily.    Yes Historical Provider, MD  Calcium Carbonate (CALTRATE 600 PO) Take 1 tablet by mouth daily.    Yes Historical Provider, MD  carvedilol (COREG) 12.5 MG tablet Take 1 tablet (12.5 mg  total) by mouth 2 (two) times daily with a meal. 02/09/13  Yes Luis Abed, MD  Cholecalciferol (D3-1000) 1000 UNITS capsule Take 1,000 Units by mouth daily.   Yes Historical Provider, MD  clindamycin (CLEOCIN T) 1 % lotion Apply 1 application topically daily. 05/06/13  Yes Historical Provider, MD  enalapril (VASOTEC) 20 MG tablet Take 1 tablet (20 mg total) by mouth 2 (two) times daily. 08/19/12 08/19/13 Yes Luis Abed, MD  EVENING PRIMROSE OIL PO Take 1 capsule by mouth daily.    Yes Historical Provider, MD  furosemide (LASIX) 20 MG tablet Take 1 tablet (20 mg total) by mouth daily with breakfast. 10/28/12  Yes Luis Abed, MD  meloxicam (MOBIC) 15 MG tablet Take 15 mg by mouth daily.   Yes Historical Provider, MD  methylcellulose packet Take 1 each by mouth daily.    Yes Historical Provider, MD  potassium chloride SA (K-DUR,KLOR-CON) 20 MEQ tablet Patient takes 2 tablets every morning and 2 at lunch and 1 tablet at dinner 08/19/12  Yes Luis Abed, MD  triamcinolone cream (KENALOG) 0.1 % Apply 1 application topically 2 (two) times daily as needed (for irritation).   Yes Historical Provider, MD   Scheduled Meds: Continuous Infusions: heparin PRN Meds:.    Allergies:  Allergies  Allergen Reactions  . Codeine Nausea And Vomiting    Some sensitivity    History   Social History  . Marital Status: Married    Spouse Name: N/A    Number of Children: 1  . Years of Education: N/A   Occupational History  . Homemaker    Social History Main Topics  . Smoking status: Never Smoker   . Smokeless tobacco: Never Used  . Alcohol Use: No  . Drug Use: No  . Sexual Activity: No   Other Topics Concern  . Not on file   Social History Narrative   3 caffeine drinks daily    Married   Retired          Family History  Problem Relation Age of Onset  . Heart attack Mother 53  . Hypertension Mother   . Heart disease Mother   . Stroke Mother   . Heart attack Father 66  . Coronary  artery disease Father     Multiple Family Members  . Hypertension Father   . Heart disease Father   . Esophageal cancer Paternal Grandmother   . Colon cancer Maternal Grandfather   . Heart disease Maternal Grandfather   . Irritable bowel syndrome Sister     More  family members on father side of   . Hypertension Sister   . Heart disease Sister   . Breast cancer Paternal Aunt     Age 20's  . Heart disease Paternal Grandfather    Family Status  Relation Status Death Age  . Mother Deceased   . Father Deceased     Review of Systems:   Full 14-point review of systems otherwise negative except as noted above.  Physical Exam: Blood pressure 118/86, pulse 97, temperature 98.7 F (37.1 C), temperature source Oral, resp. rate 17, height 5\' 4"  (1.626 m), weight 132 lb (59.875 kg), SpO2 93.00%. General: Well developed, well nourished, slender elderly female in mild distress. Head: Normocephalic, atraumatic, sclera non-icteric, no xanthomas, nares are without discharge. Dentition: good Neck: No carotid bruits, + radiation of murmur, left > right. JVD not elevated. No thyromegally Lungs: Good expansion bilaterally. without wheezes or rhonchi.  Heart: Regular rate and rhythm with S1 S2.  No S3 or S4.  2-3/6 murmur LSB, no rubs, or gallops appreciated. Abdomen: Soft, non-tender, non-distended with normoactive bowel sounds. No hepatomegaly. No rebound/guarding. No obvious abdominal masses. Msk:  Strength and tone appear normal for age. No joint deformities or effusions, no spine or costo-vertebral angle tenderness. Extremities: No clubbing or cyanosis. No edema.  Distal pedal pulses are 2+ in 4 extrem Neuro: Alert and oriented X 3. Moves all extremities spontaneously. No focal deficits noted. Psych:  Responds to questions appropriately with a normal affect. Skin: No rashes or lesions noted  Labs:   Lab Results  Component Value Date   WBC 8.8 06/17/2013   HGB 10.6* 06/17/2013   HCT 33.2*  06/17/2013   MCV 84.3 06/17/2013   PLT 179 06/17/2013     Recent Labs Lab 06/17/13 0939  NA 139  K 4.3  CL 107  CO2 20  BUN 20  CREATININE 0.69  CALCIUM 9.4  GLUCOSE 115*    Recent Labs  06/17/13 1010  TROPIPOC 0.44*   Lab Results  Component Value Date   CHOL 112 05/19/2013   HDL 65.70 05/19/2013   LDLCALC 32 05/19/2013   TRIG 72.0 05/19/2013   Lab Results  Component Value Date   DDIMER 0.63* 06/17/2013   Radiology/Studies: Dg Chest 2 View 06/17/2013   CLINICAL DATA:  Chest pain.  EXAM: CHEST  2 VIEW  COMPARISON:  02/18/2012  FINDINGS: The heart size and pulmonary vascularity are normal. There is slight atelectasis at the right lung base laterally and at the left lung base posteriorly. The lungs are otherwise clear. No acute osseous abnormality.  IMPRESSION: Minimal atelectasis at the lung bases.   Electronically Signed   By: Geanie Cooley M.D.   On: 06/17/2013 10:41   Echo: None recently  ECG:  06/17/2013 ST Vent. rate 103 BPM PR interval 163 ms QRS duration 92 ms QT/QTc 334/437 ms P-R-T axes 74 -8 82  ASSESSMENT AND PLAN:  Principal Problem:   Acute MI, initial - cardiac catheterization indicated, further evaluation and treatment depending on the results. Let cath lab staff know she had propofol before her last colonoscopy. Continue current meds, give IV Lopressor now. Continue home BB and statin, check profile.  Otherwise, continue home medications. Active Problems:   Dyslipidemia   HTN (hypertension)   Mitral valve prolapse   Signed, Theodore Demark, PA-C 06/17/2013 12:11 PM Beeper [phone]  Patient seen and examined and history reviewed. Agree with above findings and plan. Pleasant 72 yo WF with history of HTN, HL, and MV  prolapse presents with symptoms of prolonged chest pain radiating to left neck and shoulder. Ecg shows subtle ST elevation in the inferior and precordial leads but does not meet STEMI criteria. troponins are elevated. Given ongoing  chest pain I have recommended proceeding with urgent cardiac cath +/- PCI.  Theron Arista [facility] 06/17/2013 12:51 PM

## 2013-06-17 NOTE — ED Notes (Signed)
Per ems, pt c/o cp, woken up by CP, 9/10, 530am this morning, radiates to back, hurts worse when taking deep breathe. Hx of HTN. Pt took 324 asa, no relief, 1 nitro by ems, no changes. 12 lead unremarkable. Vomit x1 before ems arrival.

## 2013-06-17 NOTE — ED Provider Notes (Signed)
CSN: [id number]     Arrival date & time 06/17/13  4098 History   First MD Initiated Contact with Patient 06/17/13 [phone]     Chief Complaint  Patient presents with  . Chest Pain   (Consider location/radiation/quality/duration/timing/severity/associated sxs/prior Treatment) HPI  This is a 72 year old female with a history of hypertension, hyperlipidemia, mitral valve prolapse who presents for chest pain. Patient reports that she woke up this morning at approximately 5:30 AM with left jaw and shoulder pain. It now has midsternal pain. She describes it as sharp and radiating to her back. She reports worsening of pain with inspiration. No exertional component.  She reports the pain is 6/10. She received 4 baby aspirin and nitroglycerin by EMS. Nitroglycerin did not improve her pain. She denies any shortness of breath or diaphoresis. She does have an early family history of heart disease. She's followed by [facility] cardiology but has not had a stress test recently.  Past Medical History  Diagnosis Date  . Dyslipidemia   . Polymyalgia rheumatica   . HTN (hypertension)   . Mitral valve prolapse     echo, January, 2011, moderate prolapse with your leaflet, trivial MR   Antibiotic required for procedures  . Tricuspid regurgitation     mild to moderate, echo, January, 2011, PA pressure 38 mm mercury  . 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 Hyponatremia   . History of skin cancer   . Rheumatoid arthritis(714.0)   . Chronic cystitis   . Hemorrhoids   . Hx of colonoscopy   . IBS (irritable bowel syndrome)   . History of colon polyps   . Ejection fraction     EF 65%, echo, 2011  . Potassium (K) deficiency     5 potassium requirement over time  . Syncope     August, 200 weight, dehydration  . 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 S/P knee surgery     clearance done January, 2012  . CIN I (cervical intraepithelial  neoplasia I)   . Arthritis   . Osteoporosis   . PONV (postoperative nausea and vomiting)   . Heart murmur     MVP   . Cancer     hx of basal cell carcinoma   . Orthostasis     Mild orthostatic change when she stands   Past Surgical History  Procedure Laterality Date  . Excisional hemorrhoidectomy  2003  . Mohs surgery  2011  . Knee surgery  2012    Left -Replacement  . Colonoscopy  12/23/2003    normal (indications: prior adenomas and grandfather with colon cancer)  . Colposcopy    . Tonsillectomy    . Total knee arthroplasty  02/25/2012    Procedure: TOTAL KNEE ARTHROPLASTY;  Surgeon: Loanne Drilling, MD;  Location: [facility];  Service: Orthopedics;  Laterality: Right;   Family History  Problem Relation Age of Onset  . Heart attack Mother 72  . Hypertension Mother   . Heart disease Mother   . Stroke Mother   . Heart attack Father 41  . Coronary artery disease Father     Multiple Family Members  . Hypertension Father   . Heart disease Father   . Esophageal cancer Paternal Grandmother   . Colon cancer Maternal Grandfather   . Heart disease Maternal Grandfather   . Irritable bowel syndrome Sister     More family members on father  side of   . Hypertension Sister   . Heart disease Sister   . Breast cancer Paternal Aunt     Age 71's  . Heart disease Paternal Grandfather    History  Substance Use Topics  . Smoking status: Never Smoker   . Smokeless tobacco: Never Used  . Alcohol Use: No   OB History   Grav Para Term Preterm Abortions TAB SAB Ect Mult Living   3 3 3       1      Review of Systems  Constitutional: Negative for fever.  Respiratory: Positive for chest tightness. Negative for cough and shortness of breath.   Cardiovascular: Positive for chest pain. Negative for leg swelling.  Gastrointestinal: Positive for nausea and vomiting. Negative for abdominal pain.  Genitourinary: Negative for dysuria.  Neurological: Negative for headaches.   Psychiatric/Behavioral: Negative for confusion.  All other systems reviewed and are negative.  Level 5 caveat  Allergies  Codeine  Home Medications   No current outpatient prescriptions on file. BP 129/67  Pulse 87  Temp(Src) 98.7 F (37.1 C) (Oral)  Resp 26  Ht 5\' 4"  (1.626 m)  Wt 132 lb (59.875 kg)  BMI 22.65 kg/m2  SpO2 96% Physical Exam  Nursing note and vitals reviewed. Constitutional: She is oriented to person, place, and time. She appears well-developed. No distress.  elderly  HENT:  Head: Normocephalic and atraumatic.  Mouth/Throat: Oropharynx is clear and moist.  Eyes: Pupils are equal, round, and reactive to light.  Neck: Neck supple.  Cardiovascular: Normal rate and regular rhythm.   Murmur heard. Pulmonary/Chest: Effort normal and breath sounds normal. No respiratory distress. She has no wheezes.  Abdominal: Soft. There is no tenderness.  Musculoskeletal: She exhibits no edema.  Neurological: She is alert and oriented to person, place, and time.  Skin: Skin is warm and dry.  Psychiatric: She has a normal mood and affect.    ED Course  Procedures (including critical care time) Labs Review Labs Reviewed  CBC WITH DIFFERENTIAL - Abnormal; Notable for the following:    Hemoglobin 10.6 (*)    HCT 33.2 (*)    All other components within normal limits  BASIC METABOLIC PANEL - Abnormal; Notable for the following:    Glucose, Bld 115 (*)    GFR calc non Af Amer 84 (*)    All other components within normal limits  D-DIMER, QUANTITATIVE - Abnormal; Notable for the following:    D-Dimer, Quant 0.63 (*)    All other components within normal limits  TROPONIN I - Abnormal; Notable for the following:    Troponin I 0.67 (*)    All other components within normal limits  POCT I-STAT TROPONIN I - Abnormal; Notable for the following:    Troponin i, poc 0.44 (*)    All other components within normal limits   Imaging Review Dg Chest 2 View  06/17/2013   CLINICAL  DATA:  Chest pain.  EXAM: CHEST  2 VIEW  COMPARISON:  02/18/2012  FINDINGS: The heart size and pulmonary vascularity are normal. There is slight atelectasis at the right lung base laterally and at the left lung base posteriorly. The lungs are otherwise clear. No acute osseous abnormality.  IMPRESSION: Minimal atelectasis at the lung bases.   Electronically Signed   By: Geanie Cooley M.D.   On: 06/17/2013 10:41    EKG Interpretation     Ventricular Rate:  94 PR Interval:  164 QRS Duration: 93 QT Interval:  346  QTC Calculation: 433 R Axis:   -11 Text Interpretation:  Sinus rhythm <1 mm ST elevation, inferior leads without reciprocal changes           11:39 AM Repeat EKG for chest pain: sinus tachycardia with a rate of 106, 1 mm ST elevation in inferior leads with morphology changes   CRITICAL CARE Performed by: Ross Marcus, F   Total critical care time: 35 min  Critical care time was exclusive of separately billable procedures and treating other patients.  Critical care was necessary to treat or prevent imminent or life-threatening deterioration.  Critical care was time spent personally by me on the following activities: development of treatment plan with patient and/or surrogate as well as nursing, discussions with consultants, evaluation of patient's response to treatment, examination of patient, obtaining history from patient or surrogate, ordering and performing treatments and interventions, ordering and review of laboratory studies, ordering and review of radiographic studies, pulse oximetry and re-evaluation of patient's condition.  MDM   1. Acute MI, initial              This is a 72 year old female who reports with chest pain. She is nontoxic-appearing and vital signs are reassuring. Initial EKG with wandering baseline which I have repeated. Second EKG shows minimal less than 1 mm upsloping ST elevation in the inferior leads without reciprocal changes.  Patient  has story is somewhat atypical for ACS but the patient does have risk factors.  Other considerations include PE given that he pleuritic components of the patient's pain. Workup was initiated. Patient was given morphine and nitroglycerin paste.   10:43 AM  Initial i-STAT troponin is 0.44. Repeat EKG shows no dynamic changes.  Patient reports no chest pain at this time with nitro paste. Cardiology was consulted for evaluation.  Troponin-I sent.  Heparin was started.  11:43 AM Informed by nursing the patient's chest pain went from 0 to 7/10. She's currently on a heparin drip and his nitroglycerin in place. She was just given morphine. Repeat EKG shows just at 1 mm ST elevation in the inferior leads which appears slightly more than before. Cardiology has yet to evaluate the patient. I spoke with the cardiologist on call who will send somebody emergently to evaluate patient.  EKG was evaluated by interventional left. Patient will be taken to the Cath Lab as an urgent Cath. She still does not technically meet STEMI criteria.    Shon Baton, MD 06/17/13 [phone]

## 2013-06-17 NOTE — CV Procedure (Signed)
    Cardiac Catheterization Procedure Note  Name: [name] MRN: [id number] DOB: 02-01-1941  Procedure: Left Heart Cath, Selective Coronary Angiography, LV angiography  Indication: 72 year old white female presents with abrupt onset of chest pain this morning. Pain has persisted throughout the day. ECG shows subtle ST elevation diffusely. Cardiac troponins are positive.   Procedural Details: The right wrist was prepped, draped, and anesthetized with 1% lidocaine. Using the modified Seldinger technique, a 5 French sheath was introduced into the right radial artery. 3 mg of verapamil was administered through the sheath, weight-based unfractionated heparin was administered intravenously. Standard Judkins catheters were used for selective coronary angiography and left ventriculography. Catheter exchanges were performed over an exchange length guidewire. There were no immediate procedural complications. A TR band was used for radial hemostasis at the completion of the procedure.  The patient was transferred to the post catheterization recovery area for further monitoring.  Procedural Findings: Hemodynamics: AO 119/60 with a mean of 85 mmHg LV 136 with an EDP of 21 mmHg  Coronary angiography: Coronary dominance: right  Left mainstem: Normal.  Left anterior descending (LAD): The LAD is mildly calcified in the proximal and mid vessel. In the mid vessel there is a 40% stenosis at the takeoff of the second diagonal. The second diagonal is relatively small with a 70% ostial stenosis.  Left circumflex (LCx): The left circumflex gives rise to a single large branching marginal branch. There is 20% disease in the proximal OM.  Right coronary artery (RCA): The right coronary is a large dominant vessel. It has mild disease in the mid vessel up to 20%.  Left ventriculography: Left ventricular systolic function is abnormal, there is severe hypokinesis of the mid to distal anterior wall, mid to  distal inferior wall, and apex. LVEF is estimated at 30-35% , there is no significant mitral regurgitation   Final Conclusions:   1. Nonobstructive coronary disease. 2. Cardiomyopathy with severe left ventricular dysfunction. 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  Recommendations: Medical management. Continue beta blocker therapy and ACE inhibitor therapy. We'll cycle cardiac enzymes. Check echocardiogram.  Theron Arista [facility] 06/17/2013, 1:31 PM

## 2013-06-17 NOTE — ED Notes (Signed)
Pt returned from xray

## 2013-06-17 NOTE — ED Notes (Signed)
Old Zolle pads placed on pt.

## 2013-06-17 NOTE — ED Notes (Addendum)
Transported to the cath lab,  Belongings given to husband to take home.

## 2013-06-17 NOTE — ED Notes (Signed)
Almon Register [phone] may be reached for pt and she is visiting family on "the spine floor room 9"

## 2013-06-18 DIAGNOSIS — I059 Rheumatic mitral valve disease, unspecified: Secondary | ICD-10-CM | POA: Diagnosis not present

## 2013-06-18 DIAGNOSIS — I5181 Takotsubo syndrome: Secondary | ICD-10-CM

## 2013-06-18 DIAGNOSIS — I214 Non-ST elevation (NSTEMI) myocardial infarction: Secondary | ICD-10-CM | POA: Diagnosis not present

## 2013-06-18 DIAGNOSIS — I219 Acute myocardial infarction, unspecified: Secondary | ICD-10-CM | POA: Diagnosis not present

## 2013-06-18 DIAGNOSIS — I369 Nonrheumatic tricuspid valve disorder, unspecified: Secondary | ICD-10-CM | POA: Diagnosis not present

## 2013-06-18 DIAGNOSIS — E236 Other disorders of pituitary gland: Secondary | ICD-10-CM | POA: Diagnosis not present

## 2013-06-18 LAB — COMPREHENSIVE METABOLIC PANEL
ALT: 44 U/L — ABNORMAL HIGH (ref 0–35)
AST: 43 U/L — ABNORMAL HIGH (ref 0–37)
Albumin: 2.8 g/dL — ABNORMAL LOW (ref 3.5–5.2)
Alkaline Phosphatase: 62 U/L (ref 39–117)
BUN: 17 mg/dL (ref 6–23)
CO2: 20 mEq/L (ref 19–32)
Calcium: 8.8 mg/dL (ref 8.4–10.5)
Chloride: 107 mEq/L (ref 96–112)
Creatinine, Ser: 0.77 mg/dL (ref 0.50–1.10)
GFR calc Af Amer: >90 mL/min (ref >90)
GFR calc non Af Amer: 81 mL/min — ABNORMAL LOW (ref >90)
Glucose, Bld: 110 mg/dL — ABNORMAL HIGH (ref 70–99)
Potassium: 4.1 mEq/L (ref 3.5–5.1)
Sodium: 137 mEq/L (ref 135–145)
Total Bilirubin: 0.6 mg/dL (ref 0.3–1.2)
Total Protein: 5.3 g/dL — ABNORMAL LOW (ref 6.0–8.3)

## 2013-06-18 LAB — LIPID PANEL
Cholesterol: 89 mg/dL (ref 0–200)
HDL: 64 mg/dL (ref >39)
LDL Cholesterol: 18 mg/dL (ref 0–99)
Total CHOL/HDL Ratio: 1.4 RATIO
Triglycerides: 34 mg/dL (ref <150)
VLDL: 7 mg/dL (ref 0–40)

## 2013-06-18 LAB — TSH: TSH: <0.008 u[IU]/mL — ABNORMAL LOW (ref 0.350–4.500)

## 2013-06-18 LAB — HEMOGLOBIN A1C
Hgb A1c MFr Bld: 5.9 % — ABNORMAL HIGH (ref <5.7)
Mean Plasma Glucose: 123 mg/dL — ABNORMAL HIGH (ref <117)

## 2013-06-18 LAB — TROPONIN I
Troponin I: 1.15 ng/mL (ref <0.30)
Troponin I: 1.14 ng/mL (ref <0.30)

## 2013-06-18 MED ORDER — ISOSORBIDE MONONITRATE ER 30 MG PO TB24
30.0000 mg | ORAL_TABLET | Freq: Every day | ORAL | Status: DC
  Administered 2013-06-18 – 2013-06-20 (×3): 30 mg via ORAL
  Filled 2013-06-18 (×3): qty 1

## 2013-06-18 NOTE — Care Management Note (Signed)
    Page 1 of 1   06/18/2013     8:50:13 AM   CARE MANAGEMENT NOTE 06/18/2013  Patient:  [name], [name]   Account Number:  [id number]  Date Initiated:  06/18/2013  Documentation initiated by:  Junius Creamer  Subjective/Objective Assessment:   adm w pos troponins     Action/Plan:   lives w husbband, pcp dr Berniece Andreas   Anticipated DC Date:     Anticipated DC Plan:        DC Planning Services  CM consult      Choice offered to / List presented to:             Status of service:   Medicare Important Message given?   (If response is "NO", the following Medicare IM given date fields will be blank) Date Medicare IM given:   Date Additional Medicare IM given:    Discharge Disposition:    Per UR Regulation:  Reviewed for med. necessity/level of care/duration of stay  If discussed at Long Length of Stay Meetings, dates discussed:    Comments:

## 2013-06-18 NOTE — Progress Notes (Addendum)
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    She is feeling better.   Lots of questions this am.  Objective:    Vital Signs:   Temp:  [98.4 F (36.9 C)-99.1 F (37.3 C)] 98.7 F (37.1 C) (11/13 0411) Pulse Rate:  [72-110] 76 (11/12 2000) Resp:  [16-27] 22 (11/12 2000) BP: (111-134)/(61-86) 111/61 mmHg (11/12 2000) SpO2:  [93 %-99 %] 95 % (11/12 2000) Weight:  [132 lb (59.875 kg)-139 lb 5.3 oz (63.2 kg)] 136 lb 14.5 oz (62.1 kg) (11/13 0500)      24-hour weight change: Weight change:   Weight trends: Filed Weights   06/17/13 0933 06/17/13 1800 06/18/13 0500  Weight: 132 lb (59.875 kg) 139 lb 5.3 oz (63.2 kg) 136 lb 14.5 oz (62.1 kg)    Intake/Output:  11/12 0701 - 11/13 0700 In: 460.6 [P.O.:100; I.V.:360.6] Out: -      Physical Exam: BP 111/61  Pulse 76  Temp(Src) 98.7 F (37.1 C) (Oral)  Resp 22  Ht 5\' 4"  (1.626 m)  Wt 136 lb 14.5 oz (62.1 kg)  BMI 23.49 kg/m2  SpO2 95%  General: Vital signs reviewed and noted.   Head: Normocephalic, atraumatic.  Eyes: conjunctivae/corneas clear.  EOM's intact.   Throat: normal  Neck:  normal   Lungs:    decreased breath sounds left base  Heart:  RR  Abdomen:  Soft, non-tender, non-distended    Extremities: No edema , radial cath site is ok   Neurologic: A&O X3, CN II - XII are grossly intact.   Psych: Normal     Labs: BMET:  Recent Labs  06/17/13 0939 06/17/13 1815 06/18/13 0415  NA 139  --  137  K 4.3  --  4.1  CL 107  --  107  CO2 20  --  20  GLUCOSE 115*  --  110*  BUN 20  --  17  CREATININE 0.69 0.80 0.77  CALCIUM 9.4  --  8.8    Liver function tests:  Recent Labs  06/18/13 0415  AST 43*  ALT 44*  ALKPHOS 62  BILITOT 0.6  PROT 5.3*  ALBUMIN 2.8*   No results found for this basename: LIPASE, AMYLASE,  in the last 72 hours  CBC:  Recent Labs  06/17/13 0939 06/17/13 1815  WBC 8.8 12.8*  NEUTROABS  6.7  --   HGB 10.6* 10.1*  HCT 33.2* 31.6*  MCV 84.3 84.5  PLT 179 175    Cardiac Enzymes:  Recent Labs  06/17/13 0939 06/17/13 1915 06/17/13 2309 06/18/13 0415  TROPONINI 0.67* 0.97* 1.15* 1.14*    Coagulation Studies:  Recent Labs  06/17/13 1815  LABPROT 15.4*  INR 1.25    Other: No components found with this basename: POCBNP,   Recent Labs  06/17/13 0939  DDIMER 0.63*    Recent Labs  06/17/13 1815  HGBA1C 5.9*    Recent Labs  06/18/13 0415  CHOL 89  HDL 64  LDLCALC 18  TRIG 34  CHOLHDL 1.4    Recent Labs  06/17/13 1815  TSH <0.008*   No results found for this basename: VITAMINB12, FOLATE, FERRITIN, TIBC, IRON, RETICCTPCT,  in the last 72 hours   Other results:  Tele:  NSR , PACs  Medications:    Infusions:    Scheduled Medications: . amitriptyline  25 mg Oral QHS  . amLODipine  5  mg Oral Q supper  . aspirin EC  81 mg Oral Daily  . atorvastatin  10 mg Oral QHS  . calcium carbonate  3 tablet Oral Daily  . carvedilol  12.5 mg Oral BID WC  . cholecalciferol  1,000 Units Oral Daily  . clindamycin  1 application Topical Daily  . enalapril  20 mg Oral BID  . furosemide  20 mg Oral Q breakfast  . heparin  5,000 Units Subcutaneous Q8H  . potassium chloride  20 mEq Oral Q supper  . potassium chloride  40 mEq Oral BID WC  . psyllium  1 packet Oral Daily  . sodium chloride  3 mL Intravenous Q12H    Assessment/ Plan:    1. CAD: Takotsubo syndrome.  She was on coreg and norvasc on admission.  Will add low dose Imudr 30 and transfer to tele.  Ambulate today.  anticpiate DC tomorrow .   2. Hyperlipidemia:  LDL was 18 yesterday.  Continue same meds. Further plans per Dr. Myrtis Ser.   3. HTN:  Cont. Home meds + Imdur.  Disposition:  Length of Stay: 1  Vesta Mixer, Montez Hageman., MD, [facility] 06/18/2013, 7:37 AM Office [phone] Pager [phone]

## 2013-06-18 NOTE — Progress Notes (Addendum)
CARDIAC REHAB PHASE I   PRE:  Rate/Rhythm: 79 SR  BP:  Supine:   Sitting: 112/66  Standing:    SaO2: 98 RA  MODE:  Ambulation: 700 ft   POST:  Rate/Rhythm: 106 ST  BP:  Supine:   Sitting: 137/60  Standing:     SaO2:   1100-1205  Assisted X 1 to ambulate. Gait steady. Pt able to walk without c/o of cp or SOB. Pt to recliner after walk with call light in reach. Stated MI education with pt and husband. I gave her MI and Takotsubo Cardiomyopathy information. Pt admits to being under a lot of stress and her husband being the source of it. He has Parkinson disease and I am unsure how much he understands. We discussed stress and ways to improve stress levels.Pt agrees to Outpt. CRP in [place], will send referral. Will follow pt tomorrow to continue education. Melina Copa RN 06/18/2013 12:11 PM

## 2013-06-18 NOTE — Progress Notes (Signed)
  Echocardiogram 2D Echocardiogram has been performed.  [name] 06/18/2013, 10:59 AM

## 2013-06-19 ENCOUNTER — Inpatient Hospital Stay (HOSPITAL_COMMUNITY): Payer: Medicare Other

## 2013-06-19 DIAGNOSIS — I5181 Takotsubo syndrome: Secondary | ICD-10-CM | POA: Diagnosis not present

## 2013-06-19 DIAGNOSIS — J90 Pleural effusion, not elsewhere classified: Secondary | ICD-10-CM | POA: Diagnosis not present

## 2013-06-19 DIAGNOSIS — I219 Acute myocardial infarction, unspecified: Secondary | ICD-10-CM | POA: Diagnosis not present

## 2013-06-19 DIAGNOSIS — J9819 Other pulmonary collapse: Secondary | ICD-10-CM | POA: Diagnosis not present

## 2013-06-19 DIAGNOSIS — I4891 Unspecified atrial fibrillation: Secondary | ICD-10-CM

## 2013-06-19 NOTE — Clinical Social Work Psychosocial (Signed)
Clinical Social Work Department BRIEF PSYCHOSOCIAL ASSESSMENT 06/19/2013  Patient:  [name], [name]     Account Number:  [id number]     Admit date:  06/17/2013  Clinical Social Worker:  Varney Biles  Date/Time:  06/19/2013 03:45 PM  Referred by:  Physician  Date Referred:  06/19/2013 Referred for  Other - See comment   Other Referral:   "no support"   Interview type:  Patient Other interview type:   Pt's daughter also at bedside.    PSYCHOSOCIAL DATA Living Status:  FAMILY Admitted from facility:   Level of care:   Primary support name:  [name] Primary support relationship to patient:  SPOUSE Degree of support available:   Good--pt's daughter was at bedside and lives in [place], and pt was living home with her husband before hospital admission.    CURRENT CONCERNS Current Concerns  Other - See comment   Other Concerns:   CSW consulted because pt has "no support"    SOCIAL WORK ASSESSMENT / PLAN CSW introduced herself and ask where pt was living before she came to hospital. Pt says she lives with her husband. Pt's daughter, [name], was at bedside and says she lives in [place] also. CSW verified that pt has support system in place--pt says she is going home with husband when she leaves the hospital and she is well supported.   Assessment/plan status:  No Further Intervention Required Other assessment/ plan:   Information/referral to community resources:   None. Pt has adequate support.    PATIENT'S/FAMILY'S RESPONSE TO PLAN OF CARE: Pt receptive to CSW visit, and both pt and daughter participated in conversation with CSW. CSW signing off as no CSW needs apparent.       [name], MSW, [facility] Clinical Social Worker [phone]

## 2013-06-19 NOTE — Progress Notes (Signed)
Nutrition Brief Note  Patient identified on the Malnutrition Screening Tool (MST) Report for recent weight lost without trying.  Per readings below, patient has had a 7% weight loss since May 2014 (not significant for time frame).  Wt Readings from Last 15 Encounters:  06/19/13 137 lb 5.6 oz (62.3 kg)  06/19/13 137 lb 5.6 oz (62.3 kg)  03/19/13 136 lb (61.689 kg)  01/27/13 140 lb 1.9 oz (63.558 kg)  01/15/13 143 lb (64.864 kg)  12/26/12 149 lb (67.586 kg)  12/05/12 147 lb (66.679 kg)  07/28/12 156 lb (70.761 kg)  04/17/12 141 lb (63.957 kg)  03/20/12 162 lb (73.483 kg)  02/25/12 167 lb (75.751 kg)  02/25/12 167 lb (75.751 kg)  02/18/12 167 lb (75.751 kg)  02/13/12 164 lb (74.39 kg)  09/14/11 164 lb (74.39 kg)    Body mass index is 23.56 kg/(m^2). Patient meets criteria for Normal based on current BMI.   Current diet order is Heart Healthy, patient is consuming approximately 75-100% of meals at this time. Labs and medications reviewed.   No nutrition interventions warranted at this time. If nutrition issues arise, please consult RD.   Maureen Chatters, RD, LDN Pager #: [phone] After-Hours Pager #: [phone]

## 2013-06-19 NOTE — Progress Notes (Signed)
Patient ID: [name], female   DOB: Jun 09, 1941, 72 y.o.   MRN: [id number]    SUBJECTIVE: The patient feels good this morning. I was pleased that she's had fairly rapid return of normal function after LV function was abnormal in the cath lab 2 days ago with her Takotsubo event.. On telemetry yesterday in the late afternoon she had some atrial fibrillation. She did feel this. There is no prior history of atrial fibrillation. There is no prior history of significant palpitations. There was no evidence of emboli in the coronary arteries (arguing against atrial fibrillation causing her presentation on June 17, 2013).   Filed Vitals:   06/18/13 1133 06/18/13 1215 06/18/13 2012 06/19/13 0444  BP: 112/66 117/68 102/64 134/76  Pulse:  64 69 87  Temp: 98.1 F (36.7 C) 98.1 F (36.7 C) 97.8 F (36.6 C) 98.4 F (36.9 C)  TempSrc: Oral Oral Oral Oral  Resp:  17 18 18   Height:  5\' 4"  (1.626 m)    Weight:  134 lb 7.7 oz (61 kg)  137 lb 5.6 oz (62.3 kg)  SpO2: 98% 97% 92% 91%     Intake/Output Summary (Last 24 hours) at 06/19/13 1040 Last data filed at 06/19/13 4098  Gross per 24 hour  Intake    480 ml  Output    350 ml  Net    130 ml    LABS: Basic Metabolic Panel:  Recent Labs  11/91/47 0939 06/17/13 1815 06/18/13 0415  NA 139  --  137  K 4.3  --  4.1  CL 107  --  107  CO2 20  --  20  GLUCOSE 115*  --  110*  BUN 20  --  17  CREATININE 0.69 0.80 0.77  CALCIUM 9.4  --  8.8   Liver Function Tests:  Recent Labs  06/18/13 0415  AST 43*  ALT 44*  ALKPHOS 62  BILITOT 0.6  PROT 5.3*  ALBUMIN 2.8*   No results found for this basename: LIPASE, AMYLASE,  in the last 72 hours CBC:  Recent Labs  06/17/13 0939 06/17/13 1815  WBC 8.8 12.8*  NEUTROABS 6.7  --   HGB 10.6* 10.1*  HCT 33.2* 31.6*  MCV 84.3 84.5  PLT 179 175   Cardiac Enzymes:  Recent Labs  06/17/13 1915 06/17/13 2309 06/18/13 0415  TROPONINI 0.97* 1.15* 1.14*   BNP: No components found  with this basename: POCBNP,  D-Dimer:  Recent Labs  06/17/13 0939  DDIMER 0.63*   Hemoglobin A1C:  Recent Labs  06/17/13 1815  HGBA1C 5.9*   Fasting Lipid Panel:  Recent Labs  06/18/13 0415  CHOL 89  HDL 64  LDLCALC 18  TRIG 34  CHOLHDL 1.4   Thyroid Function Tests:  Recent Labs  06/17/13 1815  TSH <0.008*    RADIOLOGY: Dg Chest 2 View  06/19/2013   CLINICAL DATA:  Decreased lung sounds  EXAM: CHEST  2 VIEW  COMPARISON:  06/17/2013  FINDINGS: Cardiac shadow is stable. The lungs are well aerated bilaterally. Mild left basilar atelectasis is now seen. Small pleural effusions are noted as well. No acute bony abnormality is seen.  IMPRESSION: New small effusions and left basilar atelectasis.   Electronically Signed   By: Alcide Clever M.D.   On: 06/19/2013 07:32   Dg Chest 2 View  06/17/2013   CLINICAL DATA:  Chest pain.  EXAM: CHEST  2 VIEW  COMPARISON:  02/18/2012  FINDINGS: The heart size and pulmonary  vascularity are normal. There is slight atelectasis at the right lung base laterally and at the left lung base posteriorly. The lungs are otherwise clear. No acute osseous abnormality.  IMPRESSION: Minimal atelectasis at the lung bases.   Electronically Signed   By: Geanie Cooley M.D.   On: 06/17/2013 10:41    PHYSICAL EXAM   patient is oriented to person time and place. Affect is normal. There is no jugulovenous distention. Lungs are clear. Respiratory effort is nonlabored. Cardiac exam reveals S1 and S2. There no clicks or significant murmurs. The abdomen is soft. There is no peripheral edema.   TELEMETRY: I reviewed telemetry today June 19, 2013. There is normal sinus rhythm. The patient did have a prolonged episode of atrial fibrillation yesterday afternoon. This has not been a previously documented problem. It converted spontaneously.    ASSESSMENT AND PLAN:    Dyslipidemia     There is excellent control of her lipids.    Ejection fraction     Historically  she has normal left ventricular function. It was decreased in the cath lab with her Takotsubo event. There was rapid normalization of LV function.    HTN (hypertension)    Mitral valve prolapse     There is mild to moderate posterior prolapse. There is only mild MR. I have followed this for many years and it has been stable.    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 I'm not sure that this represents a contraindication to anticoagulation. However this will have to be researched further    Takotsubo syndrome      Patient is recovering nicely. She'll be ready to go home tomorrow.    Atrial fibrillation     Because the patient had atrial fib with a moderately fast response yesterday I have decided to keep her in the hospital for observation for one further day. At this time I do not plan anticoagulation. This issue will have to be addressed more completely as she is followed as an outpatient.    Willa Rough 06/19/2013 10:40 AM

## 2013-06-19 NOTE — Progress Notes (Signed)
CARDIAC REHAB PHASE I   PRE:  Rate/Rhythm: 65 SR  BP:  Supine:   Sitting: 122/70  Standing:    SaO2:   MODE:  Ambulation: 700 ft   POST:  Rate/Rhythm: 88 SR  BP:  Supine:   Sitting: 126/76  Standing:    SaO2:   Tolerated ambulation well independently without angina or distress.  Instructed to walk 2 more times today by herself or family.  Daughter present during education, very supportive.  Completed home exercise instructions, NTG usage, angina symptoms, activity restrictions, and possible councilling for stress reduction.  Patient receptive. 1005-1050 Cindra Eves RN, BSN 06/19/2013 10:42 AM

## 2013-06-20 ENCOUNTER — Inpatient Hospital Stay (HOSPITAL_COMMUNITY): Payer: Medicare Other

## 2013-06-20 ENCOUNTER — Encounter (HOSPITAL_COMMUNITY): Payer: Self-pay | Admitting: Physician Assistant

## 2013-06-20 DIAGNOSIS — I4891 Unspecified atrial fibrillation: Secondary | ICD-10-CM | POA: Diagnosis not present

## 2013-06-20 DIAGNOSIS — I5181 Takotsubo syndrome: Secondary | ICD-10-CM | POA: Diagnosis not present

## 2013-06-20 DIAGNOSIS — R51 Headache: Secondary | ICD-10-CM | POA: Diagnosis not present

## 2013-06-20 MED ORDER — NITROGLYCERIN 0.4 MG SL SUBL: 0.4000 mg | SUBLINGUAL_TABLET | SUBLINGUAL | Status: DC | PRN

## 2013-06-20 MED ORDER — ASPIRIN 325 MG PO TBEC: 325.0000 mg | DELAYED_RELEASE_TABLET | Freq: Every day | ORAL | Status: DC

## 2013-06-20 MED ORDER — CITALOPRAM HYDROBROMIDE 10 MG PO TABS: 10.0000 mg | ORAL_TABLET | Freq: Every day | ORAL | Status: DC

## 2013-06-20 NOTE — Discharge Summary (Signed)
Discharge Summary   Patient ID: [name], MRN: [id number], DOB/AGE: 12/03/1940 72 y.o.  Admit date: 06/17/2013 Discharge date: 06/20/2013   Primary Care Physician:  Lorretta Harp   Primary Cardiologist:  Dr. Zackery Barefoot   Reason for Admission:  Non-STEMI   Primary Discharge Diagnoses:  1. 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 Paroxysmal Atrial Fibrillation 4. Hypertension  5. S/p Subdural Hematoma in 2008 after a fall 6. Anxiety/Depression 7. Mitral Valve Prolapse   Wt Readings from Last 3 Encounters:  06/20/13 134 lb 0.6 oz (60.8 kg)  06/20/13 134 lb 0.6 oz (60.8 kg)  03/19/13 136 lb (61.689 kg)    Secondary Discharge Diagnoses:   Past Medical History  Diagnosis Date  . Dyslipidemia   . Polymyalgia rheumatica   . HTN (hypertension)   . Mitral valve prolapse     echo, January, 2011, moderate prolapse with your leaflet, trivial MR   Antibiotic required for procedures  . Tricuspid regurgitation     mild to moderate, echo, January, 2011, PA pressure 38 mm mercury  . 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 Hyponatremia   . Chronic cystitis   . Hemorrhoids   . Hx of colonoscopy   . IBS (irritable bowel syndrome)   . History of colon polyps   . Ejection fraction     EF 65%, echo, 2011  . Potassium (K) deficiency     5 potassium requirement over time  . Syncope     August, 200 weight, dehydration  . 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 CIN I (cervical intraepithelial neoplasia I)   . Osteoporosis   . PONV (postoperative nausea and vomiting)   . Heart murmur     MVP   . Orthostasis     Mild orthostatic change when she stands  . Basal cell carcinoma of nose     removed w/MOHs  . Osteoarthritis     "do not have RA" (06/17/2013)  . CAD (coronary artery disease)     a. NSTEMI 06/2013 => LHC:  mLAD 40,  oD2 70 (small), pOM 20, mRCA 20, mid to dist ant HK, EF 30-35% (c/w Tako-Tsubo CM)  . 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 EF 30-35% at [facility] 06/2013;  b.  f/u Echo (06/18/13):  EF 60-65%, normal wall motion, Gr 1 DD, mild MVP of post leaflet, mod TR, PASP 63  . Atrial fibrillation       Allergies:    Allergies  Allergen Reactions  . Codeine Nausea And Vomiting    Some sensitivity      Procedures Performed This Admission:   1.  Cardiac Catheterization 06/17/13: Coronary angiography:  Coronary dominance: right  Left mainstem: Normal.  Left anterior descending (LAD): The LAD is mildly calcified in the proximal and mid vessel. In the mid vessel there is a 40% stenosis at the takeoff of the second diagonal. The second diagonal is relatively small with a 70% ostial stenosis.  Left circumflex (LCx): The left circumflex gives rise to a single large branching marginal branch. There is 20% disease in the proximal OM.  Right coronary artery (RCA): The right coronary is a large dominant vessel. It has mild disease in the mid vessel up to 20%.  Left ventriculography: Left ventricular systolic function is abnormal, there is severe hypokinesis of the mid to distal anterior  wall, mid to distal inferior wall, and apex. LVEF is estimated at 30-35% , there is no significant mitral regurgitation  Final Conclusions:  1. Nonobstructive coronary disease.  2. Cardiomyopathy with severe left ventricular dysfunction. 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  Recommendations: Medical management. Continue beta blocker therapy and ACE inhibitor therapy. We'll cycle cardiac enzymes. Check echocardiogram.   Hospital Course:  [name] is a 72 y.o. female with a hx of HTN, HL, MVP, tricuspid regurgitation, rheumatoid arthritis, prior subdural hematoma.  She presented to the ED with left neck, shoulder and chest pain with assoc n/v x 1 and slight dyspnea.  She had minimal ST  changes concerning for active ischemia and initial enzymes were elevated.  However, her ECG did not meet criteria for STEMI.  However, with ongoing chest pain she was taken urgently to the cardiac cath lab for diagnostic angiography.  LHC demonstrated non-obstructive CAD and severe LV dysfunction with pattern of contraction c/w Tako-Tsubo CM.  Medical Rx was initiated.  Follow up echo on 06/18/13 demonstrated rapid return of LV function to normal.  She did have an episode of AFib on 11/13 that returned to NSR spontaneously.  Given her hx of chronic subdural hematoma, anticoagulation was held and she was observed x 1 more day.  She was evaluated by Dr. Arvilla Meres this AM and felt to be stable and ready for d/c to home.  She had no further AFib on tele.  CHADS2=2.  Risk of stroke was explained to the patient.  History was noted to be significant for traumatic SDH in 2008 with resolution by f/u CT.  Decision was made to pursue head CT prior to d/c which was negtive for any recurrent SDH or other pathology. Patient will discuss anticoagulation further with Dr. Zackery Barefoot as an outpatient.  She is sent home on ASA 325 QD.  Celexa 10 mg QD was also started at d/c.  Patient has been asked to f/u with her PCP for consideration of treatment with benzodiazepines.     Discharge Vitals:   Blood pressure 108/49, pulse 81, temperature 98 F (36.7 C), temperature source Oral, resp. rate 18, height 5\' 4"  (1.626 m), weight 134 lb 0.6 oz (60.8 kg), SpO2 96.00%.   Labs:   Recent Labs  06/17/13 1815  WBC 12.8*  HGB 10.1*  HCT 31.6*  MCV 84.5  PLT 175     Recent Labs  06/17/13 1815 06/18/13 0415  NA  --  137  K  --  4.1  CL  --  107  CO2  --  20  BUN  --  17  CREATININE 0.80 0.77  CALCIUM  --  8.8  PROT  --  5.3*  BILITOT  --  0.6  ALKPHOS  --  62  ALT  --  44*  AST  --  43*     Recent Labs  06/17/13 1915 06/17/13 2309 06/18/13 0415  TROPONINI 0.97* 1.15* 1.14*    Lab Results    Component Value Date   CHOL 89 06/18/2013   HDL 64 06/18/2013   LDLCALC 18 06/18/2013   TRIG 34 06/18/2013    Lab Results  Component Value Date   DDIMER 0.63* 06/17/2013    Lab Results  Component Value Date   TSH <0.008* 06/17/2013     Recent Labs  06/17/13 1815  INR 1.25     Diagnostic Procedures and Studies:  Dg Chest 2 View  06/19/2013  IMPRESSION: New small effusions and left basilar atelectasis.   Electronically Signed   By: Alcide Clever M.D.   On: 06/19/2013 07:32   Dg Chest 2 View  06/17/2013    IMPRESSION: Minimal atelectasis at the lung bases.   Electronically Signed   By: Geanie Cooley M.D.   On: 06/17/2013 10:41   Ct Head Wo Contrast  06/20/2013      IMPRESSION: 1. No evidence of recurrent subdural hematoma. 2. No explanation for headache.   Electronically Signed   By: Jeronimo Greaves M.D.   On: 06/20/2013 15:12    2D Echocardiogram 06/18/2013: Study Conclusions  - Left ventricle: The cavity size was normal. Wall thickness was normal. Systolic function was normal. The estimated ejection fraction was in the range of 60% to 65%. Wall motion was normal; there were no regional wall motion abnormalities. Doppler parameters are consistent with abnormal left ventricular relaxation (grade 1 diastolic dysfunction). - Mitral valve: Prolapse. Prolapse. Mild, late systolicprolapse, involving the posterior leaflet. - Atrial septum: No defect or patent foramen ovale was identified. - Tricuspid valve: Moderate regurgitation. - Pulmonary arteries: PA peak pressure: 63mm Hg (S).    Disposition:   Pt is being discharged home today in good condition.  Follow-up Plans & Appointments      Follow-up Information   Follow up with Willa Rough, MD In 2 weeks. (office will call for appointment)    Specialty:  Cardiology   Contact information:   [address] [place] [address] [phone]       Follow up with Lorretta Harp, MD. (as  directed)    Specialty:  Internal Medicine   Contact information:   [address] [place] [address] [phone]       Discharge Medications    Medication List    STOP taking these medications       aspirin 81 MG tablet  Replaced by:  aspirin 325 MG EC tablet      TAKE these medications       amitriptyline 25 MG tablet  Commonly known as:  ELAVIL  Take 25 mg by mouth at bedtime.     amLODipine 5 MG tablet  Commonly known as:  NORVASC  Take 1 tablet (5 mg total) by mouth daily with supper.     aspirin 325 MG EC tablet  Take 1 tablet (325 mg total) by mouth daily.     atorvastatin 10 MG tablet  Commonly known as:  LIPITOR  Take 10 mg by mouth at bedtime.     Biotin 5000 MCG Caps  Take 1 capsule by mouth daily.     CALTRATE 600 PO  Take 1 tablet by mouth daily.     carvedilol 12.5 MG tablet  Commonly known as:  COREG  Take 1 tablet (12.5 mg total) by mouth 2 (two) times daily with a meal.     citalopram 10 MG tablet  Commonly known as:  CELEXA  Take 1 tablet (10 mg total) by mouth daily.     clindamycin 1 % lotion  Commonly known as:  CLEOCIN T  Apply 1 application topically daily.     D3-1000 1000 UNITS capsule  Generic drug:  Cholecalciferol  Take 1,000 Units by mouth daily.     enalapril 20 MG tablet  Commonly known as:  VASOTEC  Take 1 tablet (20 mg total) by mouth 2 (two) times daily.     EVENING PRIMROSE OIL PO  Take 1 capsule  by mouth daily.     furosemide 20 MG tablet  Commonly known as:  LASIX  Take 1 tablet (20 mg total) by mouth daily with breakfast.     meloxicam 15 MG tablet  Commonly known as:  MOBIC  Take 15 mg by mouth daily.     methylcellulose packet  Take 1 each by mouth daily.     nitroGLYCERIN 0.4 MG SL tablet  Commonly known as:  NITROSTAT  Place 1 tablet (0.4 mg total) under the tongue every 5 (five) minutes as needed for chest pain.     potassium chloride SA 20 MEQ tablet  Commonly known as:   K-DUR,KLOR-CON  Patient takes 2 tablets every morning and 2 at lunch and 1 tablet at dinner     triamcinolone cream 0.1 %  Commonly known as:  KENALOG  Apply 1 application topically 2 (two) times daily as needed (for irritation).         Outstanding Labs/Studies  1. None   Duration of Discharge Encounter: Greater than 30 minutes including physician and PA time.  Signed, Tereso Newcomer, PA-C   06/20/2013 4:51 PM

## 2013-06-20 NOTE — Progress Notes (Signed)
Patient ID: [name], female   DOB: Jul 07, 1941, 72 y.o.   MRN: [id number]    SUBJECTIVE: The patient feels good this morning. I was pleased that she's had fairly rapid return of normal function after LV function was abnormal in the cath lab 2 days ago with her Takotsubo event.. On telemetry 2 days ago  she had some atrial fibrillation. She did feel this. There is no prior history of atrial fibrillation.   Feels fine. Remains very anxious. No CP or SOB.   Past Medical History  Diagnosis Date  . Dyslipidemia   . Polymyalgia rheumatica   . HTN (hypertension)   . Mitral valve prolapse     echo, January, 2011, moderate prolapse with your leaflet, trivial MR   Antibiotic required for procedures  . Tricuspid regurgitation     mild to moderate, echo, January, 2011, PA pressure 38 mm mercury  . 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 Hyponatremia   . Chronic cystitis   . Hemorrhoids   . Hx of colonoscopy   . IBS (irritable bowel syndrome)   . History of colon polyps   . Ejection fraction     EF 65%, echo, 2011  . Potassium (K) deficiency     5 potassium requirement over time  . Syncope     August, 200 weight, dehydration  . 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 CIN I (cervical intraepithelial neoplasia I)   . Osteoporosis   . PONV (postoperative nausea and vomiting)   . Heart murmur     MVP   . Orthostasis     Mild orthostatic change when she stands  . Basal cell carcinoma of nose     removed w/MOHs  . Osteoarthritis     "do not have RA" (06/17/2013)    Filed Vitals:   06/19/13 0444 06/19/13 1429 06/19/13 1956 06/20/13 0439  BP: 134/76 112/40 105/43 119/73  Pulse: 87 93 77 72  Temp: 98.4 F (36.9 C) 99.2 F (37.3 C) 98.1 F (36.7 C) 97.4 F (36.3 C)  TempSrc: Oral Oral Oral Oral  Resp: 18 18 18 18   Height:      Weight: 62.3 kg (137 lb 5.6 oz)   60.8 kg (134 lb 0.6 oz)  SpO2: 91% 97% 95%  95%     Intake/Output Summary (Last 24 hours) at 06/20/13 1308 Last data filed at 06/20/13 0813  Gross per 24 hour  Intake    480 ml  Output      0 ml  Net    480 ml    LABS: Basic Metabolic Panel:  Recent Labs  40/98/11 1815 06/18/13 0415  NA  --  137  K  --  4.1  CL  --  107  CO2  --  20  GLUCOSE  --  110*  BUN  --  17  CREATININE 0.80 0.77  CALCIUM  --  8.8   Liver Function Tests:  Recent Labs  06/18/13 0415  AST 43*  ALT 44*  ALKPHOS 62  BILITOT 0.6  PROT 5.3*  ALBUMIN 2.8*   No results found for this basename: LIPASE, AMYLASE,  in the last 72 hours CBC:  Recent Labs  06/17/13 1815  WBC 12.8*  HGB 10.1*  HCT 31.6*  MCV 84.5  PLT 175   Cardiac Enzymes:  Recent Labs  06/17/13 1915 06/17/13 2309 06/18/13 0415  TROPONINI 0.97*  1.15* 1.14*   BNP: No components found with this basename: POCBNP,  D-Dimer: No results found for this basename: DDIMER,  in the last 72 hours Hemoglobin A1C:  Recent Labs  06/17/13 1815  HGBA1C 5.9*   Fasting Lipid Panel:  Recent Labs  06/18/13 0415  CHOL 89  HDL 64  LDLCALC 18  TRIG 34  CHOLHDL 1.4   Thyroid Function Tests:  Recent Labs  06/17/13 1815  TSH <0.008*    RADIOLOGY: Dg Chest 2 View  06/19/2013   CLINICAL DATA:  Decreased lung sounds  EXAM: CHEST  2 VIEW  COMPARISON:  06/17/2013  FINDINGS: Cardiac shadow is stable. The lungs are well aerated bilaterally. Mild left basilar atelectasis is now seen. Small pleural effusions are noted as well. No acute bony abnormality is seen.  IMPRESSION: New small effusions and left basilar atelectasis.   Electronically Signed   By: Alcide Clever M.D.   On: 06/19/2013 07:32   Dg Chest 2 View  06/17/2013   CLINICAL DATA:  Chest pain.  EXAM: CHEST  2 VIEW  COMPARISON:  02/18/2012  FINDINGS: The heart size and pulmonary vascularity are normal. There is slight atelectasis at the right lung base laterally and at the left lung base posteriorly. The lungs are  otherwise clear. No acute osseous abnormality.  IMPRESSION: Minimal atelectasis at the lung bases.   Electronically Signed   By: Geanie Cooley M.D.   On: 06/17/2013 10:41    PHYSICAL EXAM   patient is oriented to person time and place. Affect is stressedThere is no jugulovenous distention. Lungs are clear. Respiratory effort is nonlabored. Cardiac exam reveals S1 and S2. There no clicks or significant murmurs. The abdomen is soft. There is no peripheral edema.   TELEMETRY: NSR. No further AF   ASSESSMENT AND PLAN:  1) Takotsubo CM with recovered EF 2) HTN 3) PAF - new dx. CHADS 2 =2  4) h/o fall with concussion and SDH in 2008.  5) Anxiety, severe 6) MVP  Doing well from cardiac standpoint. Very long talk about AF and risk of stroke. Given CHADS2 = 2 and chadsvasc of almost 3 (71 y/o) would consider anticoagulation with apixaban (if she can afford) or warfarin. She is unsure she wants to do this. She had traumatic SDH in 2008 and last CT shows resolution of bleed. Will get f/u CT today and she will continue the discussion with Dr. Myrtis Ser as an outpatient. Use ECASA 325 daily until that point. Will start celexa 10 daily for depression/anxiety. Can f/u with PCP with regard to script for benzos.   Continue ACE and b-blocker.   Javaria Knapke,MD 1:15 PM

## 2013-06-26 ENCOUNTER — Telehealth: Payer: Self-pay

## 2013-06-26 ENCOUNTER — Encounter: Payer: Self-pay | Admitting: Cardiology

## 2013-06-26 ENCOUNTER — Other Ambulatory Visit (INDEPENDENT_AMBULATORY_CARE_PROVIDER_SITE_OTHER): Payer: Medicare Other

## 2013-06-26 ENCOUNTER — Ambulatory Visit (INDEPENDENT_AMBULATORY_CARE_PROVIDER_SITE_OTHER): Payer: Medicare Other | Admitting: Cardiology

## 2013-06-26 VITALS — BP 142/76 | HR 50 | Ht 64.0 in | Wt 130.0 lb

## 2013-06-26 DIAGNOSIS — I341 Nonrheumatic mitral (valve) prolapse: Secondary | ICD-10-CM

## 2013-06-26 DIAGNOSIS — IMO0002 Reserved for concepts with insufficient information to code with codable children: Secondary | ICD-10-CM

## 2013-06-26 DIAGNOSIS — I10 Essential (primary) hypertension: Secondary | ICD-10-CM | POA: Diagnosis not present

## 2013-06-26 DIAGNOSIS — I059 Rheumatic mitral valve disease, unspecified: Secondary | ICD-10-CM

## 2013-06-26 DIAGNOSIS — S065X9A Traumatic subdural hemorrhage with loss of consciousness of unspecified duration, initial encounter: Secondary | ICD-10-CM

## 2013-06-26 DIAGNOSIS — R6889 Other general symptoms and signs: Secondary | ICD-10-CM | POA: Diagnosis not present

## 2013-06-26 DIAGNOSIS — S065XAA Traumatic subdural hemorrhage with loss of consciousness status unknown, initial encounter: Secondary | ICD-10-CM

## 2013-06-26 DIAGNOSIS — R9430 Abnormal result of cardiovascular function study, unspecified: Secondary | ICD-10-CM

## 2013-06-26 DIAGNOSIS — R9431 Abnormal electrocardiogram [ECG] [EKG]: Secondary | ICD-10-CM

## 2013-06-26 DIAGNOSIS — I4891 Unspecified atrial fibrillation: Secondary | ICD-10-CM

## 2013-06-26 DIAGNOSIS — I5181 Takotsubo syndrome: Secondary | ICD-10-CM

## 2013-06-26 DIAGNOSIS — I6200 Nontraumatic subdural hemorrhage, unspecified: Secondary | ICD-10-CM

## 2013-06-26 DIAGNOSIS — R7989 Other specified abnormal findings of blood chemistry: Secondary | ICD-10-CM

## 2013-06-26 DIAGNOSIS — R0989 Other specified symptoms and signs involving the circulatory and respiratory systems: Secondary | ICD-10-CM | POA: Diagnosis not present

## 2013-06-26 DIAGNOSIS — Z8639 Personal history of other endocrine, nutritional and metabolic disease: Secondary | ICD-10-CM | POA: Insufficient documentation

## 2013-06-26 LAB — TSH: TSH: 0.03 u[IU]/mL — ABNORMAL LOW (ref 0.35–5.50)

## 2013-06-26 LAB — T3, FREE: T3, Free: 6.4 pg/mL — ABNORMAL HIGH (ref 2.3–4.2)

## 2013-06-26 LAB — MAGNESIUM: Magnesium: 1.9 mg/dL (ref 1.5–2.5)

## 2013-06-26 LAB — T4, FREE: Free T4: 2.38 ng/dL — ABNORMAL HIGH (ref 0.60–1.60)

## 2013-06-26 NOTE — Assessment & Plan Note (Signed)
CT scan was done to reassess the prior history of a subdural hematoma. The CT reveals no residua. This would no longer be a reason to withhold anticoagulation if needed.

## 2013-06-26 NOTE — Assessment & Plan Note (Addendum)
With Hospital data review today I realize that her TSH was markedly reduced. TSH will be repeated along with T3 and T4.  As part of today's evaluation I spent greater than one hour with her total assessment. This involves reevaluation of all the hospital data. It involves a long discussion with the patient about all aspects of her care. It also involves review of her EKG with other physicians in the group.

## 2013-06-26 NOTE — Assessment & Plan Note (Addendum)
Patient had excellent return of LV function within 48 hours of her Takotsubo event. I'm concerned about the continued EKG abnormality. However this EKG looks very similar to what she had in the hospital. I will consider an additional followup echo at some point.

## 2013-06-26 NOTE — Assessment & Plan Note (Signed)
Blood pressures control. No change in therapy. 

## 2013-06-26 NOTE — Telephone Encounter (Signed)
LMTCB. Per Dr Myrtis Ser the pt needs to have a magnesium level, TSH, Free T3 and Free T4 drawn soon.

## 2013-06-26 NOTE — Assessment & Plan Note (Signed)
The patient had one episode of atrial fibrillation in the hospital. She and I discussed fully the question of anticoagulation. Other team members have suggested anticoagulation to her. I continued to review all of the issues. Today I have also realize that her TSH was dramatically low in the hospital. It is possible that she is hyperthyroid. An argument against this would be ongoing sinus bradycardia at this time. I will continue to delay the question of full anticoagulation until these issues are resolved.

## 2013-06-26 NOTE — Assessment & Plan Note (Signed)
We know from her recent echo that her LV function remains normal. She is mild to moderate mitral regurgitation. There was no significant change. She has moderate posterior prolapse.

## 2013-06-26 NOTE — Assessment & Plan Note (Signed)
Clinically the patient has had an excellent response. Her left ventricular function rapidly normalized. I'm concerned about the ongoing EKG change.

## 2013-06-26 NOTE — Patient Instructions (Signed)
**Note De-identified  Obfuscation** Your physician recommends that you continue on your current medications as directed. Please refer to the Current Medication list given to you today.  Your physician recommends that you schedule a follow-up appointment in: 3 weeks  

## 2013-06-26 NOTE — Progress Notes (Signed)
HPI   The patient is seen post hospitalization. She presented on June 17, 2013 with chest pain. Her initial EKG raised the question of slight ST elevation. Urgent cardiac catheterization was done. Her coronary arteries revealed no significant epicardial disease. Wall motion in the cath lab suggested Takotsubo syndrome. On June 18, 2013 her EKG revealed marked T wave inversions in leads 23 aVF and V3 to V6. Two-dimensional echo at 48 hours after her presentation showed returned to normal wall motion. While in the hospital she did have one episode of atrial fibrillation with a rate of approximately 140. This resolved spontaneously and she had no further episodes. While reviewing all aspects of her hospitalization today, I realize that we have not commented previously on a TSH value from June 17, 2013 of  <0008.  This is very low. He will be repeated immediately along with T3 and T4.  Since being home from the hospital she's feeling well. She is not having any significant problems. She has not felt any significant palpitations.  Allergies  Allergen Reactions  . Codeine Nausea And Vomiting    Some sensitivity    Current Outpatient Prescriptions  Medication Sig Dispense Refill  . amitriptyline (ELAVIL) 25 MG tablet Take 25 mg by mouth at bedtime.       Marland Kitchen amLODipine (NORVASC) 5 MG tablet Take 1 tablet (5 mg total) by mouth daily with supper.  90 tablet  3  . aspirin EC 325 MG EC tablet Take 1 tablet (325 mg total) by mouth daily.      Marland Kitchen atorvastatin (LIPITOR) 10 MG tablet Take 10 mg by mouth at bedtime.      . Biotin 5000 MCG CAPS Take 1 capsule by mouth daily.       . Calcium Carbonate (CALTRATE 600 PO) Take 1 tablet by mouth daily.       . carvedilol (COREG) 12.5 MG tablet Take 1 tablet (12.5 mg total) by mouth 2 (two) times daily with a meal.  180 tablet  3  . Cholecalciferol (D3-1000) 1000 UNITS capsule Take 1,000 Units by mouth daily.      . citalopram (CELEXA) 10 MG tablet Take  1 tablet (10 mg total) by mouth daily.  30 tablet  1  . clindamycin (CLEOCIN T) 1 % lotion Apply 1 application topically daily.      . enalapril (VASOTEC) 20 MG tablet Take 1 tablet (20 mg total) by mouth 2 (two) times daily.  180 tablet  3  . EVENING PRIMROSE OIL PO Take 1 capsule by mouth daily.       . furosemide (LASIX) 20 MG tablet Take 1 tablet (20 mg total) by mouth daily with breakfast.  90 tablet  2  . meloxicam (MOBIC) 15 MG tablet Take 15 mg by mouth daily.      . methylcellulose packet Take 1 each by mouth daily.       . nitroGLYCERIN (NITROSTAT) 0.4 MG SL tablet Place 1 tablet (0.4 mg total) under the tongue every 5 (five) minutes as needed for chest pain.  25 tablet  12  . potassium chloride SA (K-DUR,KLOR-CON) 20 MEQ tablet Patient takes 2 tablets every morning and 2 at lunch and 1 tablet at dinner  320 tablet  3   No current facility-administered medications for this visit.    History   Social History  . Marital Status: Married    Spouse Name: N/A    Number of Children: 1  . Years of Education:  N/A   Occupational History  . Homemaker    Social History Main Topics  . Smoking status: Never Smoker   . Smokeless tobacco: Never Used  . Alcohol Use: No  . Drug Use: No  . Sexual Activity: No   Other Topics Concern  . Not on file   Social History Narrative   3 caffeine drinks daily    Married   Retired          Family History  Problem Relation Age of Onset  . Heart attack Mother 26  . Hypertension Mother   . Heart disease Mother   . Stroke Mother   . Heart attack Father 20  . Coronary artery disease Father     Multiple Family Members  . Hypertension Father   . Heart disease Father   . Esophageal cancer Paternal Grandmother   . Colon cancer Maternal Grandfather   . Heart disease Maternal Grandfather   . Irritable bowel syndrome Sister     More family members on father side of   . Hypertension Sister   . Heart disease Sister   . Breast cancer Paternal  Aunt     Age 74's  . Heart disease Paternal Grandfather     Past Medical History  Diagnosis Date  . Dyslipidemia   . Polymyalgia rheumatica   . HTN (hypertension)   . Mitral valve prolapse     echo, January, 2011, moderate prolapse with your leaflet, trivial MR   Antibiotic required for procedures  . Tricuspid regurgitation     mild to moderate, echo, January, 2011, PA pressure 38 mm mercury  . 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 Hyponatremia   . Chronic cystitis   . Hemorrhoids   . Hx of colonoscopy   . IBS (irritable bowel syndrome)   . History of colon polyps   . Ejection fraction     EF 65%, echo, 2011  . Potassium (K) deficiency     5 potassium requirement over time  . Syncope     August, 200 weight, dehydration  . 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 CIN I (cervical intraepithelial neoplasia I)   . Osteoporosis   . PONV (postoperative nausea and vomiting)   . Heart murmur     MVP   . Orthostasis     Mild orthostatic change when she stands  . Basal cell carcinoma of nose     removed w/MOHs  . Osteoarthritis     "do not have RA" (06/17/2013)  . CAD (coronary artery disease)     a. NSTEMI 06/2013 => LHC:  mLAD 40, oD2 70 (small), pOM 20, mRCA 20, mid to dist ant HK, EF 30-35% (c/w Tako-Tsubo CM)  . 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 EF 30-35% at [facility] 06/2013;  b.  f/u Echo (06/18/13):  EF 60-65%, normal wall motion, Gr 1 DD, mild MVP of post leaflet, mod TR, PASP 63  . Atrial fibrillation     Past Surgical History  Procedure Laterality Date  . Excisional hemorrhoidectomy  2003  . Mohs surgery Left 2011    "side of my nose" (06/17/2013)  . Total knee arthroplasty  08/2010  . Colonoscopy  12/23/2003    normal (indications: prior adenomas and grandfather with colon cancer)  . Colposcopy    . Tonsillectomy    . Total knee arthroplasty  02/25/2012    Procedure: TOTAL  KNEE  ARTHROPLASTY;  Surgeon: Loanne Drilling, MD;  Location: [facility];  Service: Orthopedics;  Laterality: Right;  . Cardiac catheterization  06/17/2013    Patient Active Problem List   Diagnosis Date Noted  . Dyslipidemia     Priority: High  . Ejection fraction     Priority: High  . Potassium (K) deficiency     Priority: High  . Takotsubo syndrome 06/19/2013  . Atrial fibrillation 06/19/2013  . Orthostasis   . H/O dizziness 12/26/2012  . Abnormal LFTs 12/26/2012  . NSAID long-term use 12/05/2012  . Acute epigastric pain 12/05/2012  . CIN I (cervical intraepithelial neoplasia I)   . Ringing in ears 05/09/2011  . HTN (hypertension)   . Mitral valve prolapse   . Tricuspid regurgitation   . Subdural hematoma   . Syncope   . Personal history of colonic polyps 02/06/2011  . Personal history of failed moderate sedation 02/06/2011  . RHINITIS 08/23/2010  . ARTHRITIS, KNEE 08/23/2010  . SKIN CANCER, HX OF 02/24/2010  . FX CLSD SKL VLT W/HEM NEC, CONCUSSION NOS 04/07/2007  . POLYMYALGIA RHEUMATICA 04/02/2007  . OSTEOPOROSIS 03/11/2007    ROS   Patient denies fever, chills, headache, sweats, rash, change in vision, change in hearing, chest pain, cough, nausea vomiting, urinary symptoms. All other systems are reviewed and are negative.  PHYSICAL EXAM  Patient is stable. She is oriented to person time and place. Affect is normal. There is no jugulovenous distention. Lungs are clear. Respiratory effort is nonlabored. Cardiac exam reveals S1 and S2. Abdomen is soft. There is no peripheral edema. There no musculoskeletal deformities. There are no skin rashes.  Filed Vitals:   06/26/13 1117  BP: 142/76  Pulse: 50  Height: 5\' 4"  (1.626 m)  Weight: 130 lb (58.968 kg)   EKG is done today and reviewed by me. There is sinus bradycardia with a rate of 50. Her corrected QT interval is 470 ms. There is marked T-wave inversion in leads 2,3, aVF and V3 to V6. This is similar to the tracing in the  hospital at June 18, 2013.  ASSESSMENT & PLAN

## 2013-06-26 NOTE — Assessment & Plan Note (Addendum)
The patient's EKG continues to show marked T wave changes. She is one week out from her event. I will continue to follow. It is most likely that the changes are evolution from her event one week ago. Magnesium will be checked along with her thyroid functions

## 2013-06-26 NOTE — Telephone Encounter (Signed)
Pt called back and agreed to have labs drawn at the [place] office today. Labs already in chart.

## 2013-06-27 ENCOUNTER — Encounter: Payer: Self-pay | Admitting: Cardiology

## 2013-06-27 NOTE — Progress Notes (Signed)
Patient ID: [name], female   DOB: Jun 29, 1941, 71 y.o.   MRN: [id number]  I saw the patient in the office yesterday. After reviewing all of her information further I decided to repeat her TSH and obtain a T3 and T4. Her TSH was quite low and that T3 and T4 were elevated. This is consistent with hyperthyroidism. This may well explain her weight loss. It is unusual that she has resting bradycardia in setting of tachycardia, but this is not unheard of.  Today I spoke with Dr. Fabian Sharp, the patient's primary physician. We discussed the situation at length. Dr. Fabian Sharp has arranged to see the patient at 9:30 on June 29, 2013.  I called and spoke with the patient at length. She is very appreciative of the care. She will see Dr. Fabian Sharp for followup.  Also, the patient had been started on Celexa very recently when she left the hospital. I informed the patient that Dr. Fabian Sharp and I want her to discontinue this medicine at this time.  Jerral Bonito, MD

## 2013-06-29 ENCOUNTER — Ambulatory Visit (INDEPENDENT_AMBULATORY_CARE_PROVIDER_SITE_OTHER): Payer: Medicare Other | Admitting: Internal Medicine

## 2013-06-29 ENCOUNTER — Encounter: Payer: Self-pay | Admitting: Internal Medicine

## 2013-06-29 VITALS — BP 134/70 | HR 52 | Temp 97.7°F | Wt 132.0 lb

## 2013-06-29 DIAGNOSIS — I4891 Unspecified atrial fibrillation: Secondary | ICD-10-CM | POA: Diagnosis not present

## 2013-06-29 DIAGNOSIS — E0590 Thyrotoxicosis, unspecified without thyrotoxic crisis or storm: Secondary | ICD-10-CM

## 2013-06-29 DIAGNOSIS — I5181 Takotsubo syndrome: Secondary | ICD-10-CM

## 2013-06-29 NOTE — Progress Notes (Signed)
Chief Complaint  Patient presents with  . Follow-up    hosp thyroid abnormal    HPI: Patient comes in today referred by cardiology Dr. Myrtis Ser because of abnormal thyroid tests discovered on and after hospitalization for takotsubo  cardiomyopathy diagnosed with chest pain elevated markers and cardiac catheterization. She was also noted at some point to have atrial fibrillation. Decisions about anticoagulations are in considerations  . At one point  was noted to be anxious and placed on Celexa. Which Dr Myrtis Ser and I  decided to have her not take until further notice. Dr. Myrtis Ser repeated her thyroid test last week which showed suppressed TSH and elevated T3 and T4. Her pulse has been in the 50s. Her LV function has improved. Admits  to more fatigue and .  Poor concentration and perhaps some tremors over the last month. She has had some weight loss and some loose stools. She has been on currently a law for a while. She has an extreme stress because of her husband's medical conditions managing this. He has Parkinson's. ROS: See pertinent positives and negatives per HPI. There is no family history of thyroid disease blood relative syncope recently racing heart. No new supplements. No thyroid supplements.   Past Medical History  Diagnosis Date  . Dyslipidemia   . Polymyalgia rheumatica   . HTN (hypertension)   . Mitral valve prolapse     echo, January, 2011, moderate prolapse with your leaflet, trivial MR   Antibiotic required for procedures  . Tricuspid regurgitation     mild to moderate, echo, January, 2011, PA pressure 38 mm mercury  . 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 Hyponatremia   . Chronic cystitis   . Hemorrhoids   . Hx of colonoscopy   . IBS (irritable bowel syndrome)   . History of colon polyps   . Ejection fraction     EF 65%, echo, 2011  . Potassium (K) deficiency     5 potassium requirement over time  . Syncope     August, 200 weight,  dehydration  . 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 CIN I (cervical intraepithelial neoplasia I)   . Osteoporosis   . PONV (postoperative nausea and vomiting)   . Heart murmur     MVP   . Orthostasis     Mild orthostatic change when she stands  . Basal cell carcinoma of nose     removed w/MOHs  . Osteoarthritis     "do not have RA" (06/17/2013)  . CAD (coronary artery disease)     a. NSTEMI 06/2013 => LHC:  mLAD 40, oD2 70 (small), pOM 20, mRCA 20, mid to dist ant HK, EF 30-35% (c/w Tako-Tsubo CM)  . 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 EF 30-35% at [facility] 06/2013;  b.  f/u Echo (06/18/13):  EF 60-65%, normal wall motion, Gr 1 DD, mild MVP of post leaflet, mod TR, PASP 63  . Atrial fibrillation   . Abnormal EKG     Family History  Problem Relation Age of Onset  . Heart attack Mother 58  . Hypertension Mother   . Heart disease Mother   . Stroke Mother   . Heart attack Father 60  . Coronary artery disease Father     Multiple Family Members  . Hypertension Father   . Heart disease Father   . Esophageal cancer Paternal Grandmother   .  Colon cancer Maternal Grandfather   . Heart disease Maternal Grandfather   . Irritable bowel syndrome Sister     More family members on father side of   . Hypertension Sister   . Heart disease Sister   . Breast cancer Paternal Aunt     Age 75's  . Heart disease Paternal Grandfather     History   Social History  . Marital Status: Married    Spouse Name: N/A    Number of Children: 1  . Years of Education: N/A   Occupational History  . Homemaker    Social History Main Topics  . Smoking status: Never Smoker   . Smokeless tobacco: Never Used  . Alcohol Use: No  . Drug Use: No  . Sexual Activity: No   Other Topics Concern  . None   Social History Narrative   3 caffeine drinks daily    Married   Retired          Outpatient Encounter Prescriptions as of 06/29/2013  Medication Sig  .  amitriptyline (ELAVIL) 25 MG tablet Take 25 mg by mouth at bedtime.   Marland Kitchen amLODipine (NORVASC) 5 MG tablet Take 1 tablet (5 mg total) by mouth daily with supper.  Marland Kitchen aspirin EC 325 MG EC tablet Take 1 tablet (325 mg total) by mouth daily.  Marland Kitchen atorvastatin (LIPITOR) 10 MG tablet Take 10 mg by mouth at bedtime.  . Biotin 5000 MCG CAPS Take 1 capsule by mouth daily.   . Calcium Carbonate (CALTRATE 600 PO) Take 1 tablet by mouth daily.   . carvedilol (COREG) 12.5 MG tablet Take 1 tablet (12.5 mg total) by mouth 2 (two) times daily with a meal.  . Cholecalciferol (D3-1000) 1000 UNITS capsule Take 1,000 Units by mouth daily.  . clindamycin (CLEOCIN T) 1 % lotion Apply 1 application topically daily.  . enalapril (VASOTEC) 20 MG tablet Take 1 tablet (20 mg total) by mouth 2 (two) times daily.  Marland Kitchen EVENING PRIMROSE OIL PO Take 1 capsule by mouth daily.   . furosemide (LASIX) 20 MG tablet Take 1 tablet (20 mg total) by mouth daily with breakfast.  . meloxicam (MOBIC) 15 MG tablet Take 15 mg by mouth daily.  . methylcellulose packet Take 1 each by mouth daily.   . potassium chloride SA (K-DUR,KLOR-CON) 20 MEQ tablet Patient takes 2 tablets every morning and 2 at lunch and 1 tablet at dinner  . nitroGLYCERIN (NITROSTAT) 0.4 MG SL tablet Place 1 tablet (0.4 mg total) under the tongue every 5 (five) minutes as needed for chest pain.  . [DISCONTINUED] citalopram (CELEXA) 10 MG tablet Take 1 tablet (10 mg total) by mouth daily.    EXAM:  BP 134/70  Pulse 52  Temp(Src) 97.7 F (36.5 C) (Oral)  Wt 132 lb (59.875 kg)  SpO2 98%  Body mass index is 22.65 kg/(m^2).  GENERAL: vitals reviewed and listed above, alert, oriented, appears well hydrated and in no acute distress she appears quite well and nontoxic HEENT: atraumatic, conjunctiva  clear, no obvious abnormalities on inspection of external nose and ears  NECK: no obvious masses on inspection palpation I don't feel a thyroid nodule LUNGS: clear to  auscultation bilaterally, no wheezes, rales or rhonchi, good air movement CV: HRRR, no clubbing cyanosis or  peripheral edema nl cap refill  MS: moves all extremities without noticeable focal  abnormality Abdomen soft without organomegaly Neurologic grossly nonfocal no obvious tremor. PSYCH: pleasant and cooperative, no obvious depression or anxiety normal  speech and cognition. Lab Results  Component Value Date   TSH 0.03* 06/26/2013   Wt Readings from Last 3 Encounters:  06/29/13 132 lb (59.875 kg)  06/26/13 130 lb (58.968 kg)  06/20/13 134 lb 0.6 oz (60.8 kg)  Free t3 6.4 free t4 2.38 ASSESSMENT AND PLAN:  Discussed the following assessment and plan:  Hyperthyroidism - No history of same or family history could have been contributing to many of her symptoms.  Atrial fibrillation - Not sustained consideration for anticoagulation ;could be triggered by thyroid excess.  Takotsubo syndrome - LV function improved back to normal on echo Discussed with Dr. cerci on the phone today she can be seen tomorrow at 8:15 appointment patient can contact his can keep that appointment and can come in at 3. Iodine treatment will have to be delayed because of her contrast exposure.but candidate for Tapazole. Information given to patient. -Patient advised to return or notify health care team  if symptoms worsen or persist or new concerns arise.  Patient Instructions  We are going to arrange  Endocrinology to see you asap  To plan appropriate treatment,   Hyperthyroidism The thyroid is a large gland located in the lower front part of your neck. The thyroid helps control metabolism. Metabolism is how your body uses food. It controls metabolism with the hormone thyroxine. When the thyroid is overactive, it produces too much hormone. When this happens, these following problems may occur:   Nervousness  Heat intolerance  Weight loss (in spite of increase food intake)  Diarrhea  Change in hair  or skin texture  Palpitations (heart skipping or having extra beats)  Tachycardia (rapid heart rate)  Loss of menstruation (amenorrhea)  Shaking of the hands CAUSES  Grave's Disease (the immune system attacks the thyroid gland). This is the most common cause.  Inflammation of the thyroid gland.  Tumor (usually benign) in the thyroid gland or elsewhere.  Excessive use of thyroid medications (both prescription and 'natural').  Excessive ingestion of Iodine. DIAGNOSIS  To prove hyperthyroidism, your caregiver may do blood tests and ultrasound tests. Sometimes the signs are hidden. It may be necessary for your caregiver to watch this illness with blood tests, either before or after diagnosis and treatment. TREATMENT Short-term treatment There are several treatments to control symptoms. Drugs called beta blockers may give some relief. Drugs that decrease hormone production will provide temporary relief in many people. These measures will usually not give permanent relief. Definitive therapy There are treatments available which can be discussed between you and your caregiver which will permanently treat the problem. These treatments range from surgery (removal of the thyroid), to the use of radioactive iodine (destroys the thyroid by radiation), to the use of antithyroid drugs (interfere with hormone synthesis). The first two treatments are permanent and usually successful. They most often require hormone replacement therapy for life. This is because it is impossible to remove or destroy the exact amount of thyroid required to make a person euthyroid (normal). HOME CARE INSTRUCTIONS  See your caregiver if the problems you are being treated for get worse. Examples of this would be the problems listed above. SEEK MEDICAL CARE IF: Your general condition worsens. MAKE SURE YOU:   Understand these instructions.  Will watch your condition.  Will get help right away if you are not doing well  or get worse. Document Released: 07/23/2005 Document Revised: 10/15/2011 Document Reviewed: 12/04/2006 [facility] Patient Information 2014 [place], [place].   Neta Mends. Panosh M.D.  Total visit  40 mins > 50% spent counseling and coordinating care

## 2013-06-29 NOTE — Patient Instructions (Signed)
We are going to arrange  Endocrinology to see you asap  To plan appropriate treatment,   Hyperthyroidism The thyroid is a large gland located in the lower front part of your neck. The thyroid helps control metabolism. Metabolism is how your body uses food. It controls metabolism with the hormone thyroxine. When the thyroid is overactive, it produces too much hormone. When this happens, these following problems may occur:   Nervousness  Heat intolerance  Weight loss (in spite of increase food intake)  Diarrhea  Change in hair or skin texture  Palpitations (heart skipping or having extra beats)  Tachycardia (rapid heart rate)  Loss of menstruation (amenorrhea)  Shaking of the hands CAUSES  Grave's Disease (the immune system attacks the thyroid gland). This is the most common cause.  Inflammation of the thyroid gland.  Tumor (usually benign) in the thyroid gland or elsewhere.  Excessive use of thyroid medications (both prescription and 'natural').  Excessive ingestion of Iodine. DIAGNOSIS  To prove hyperthyroidism, your caregiver may do blood tests and ultrasound tests. Sometimes the signs are hidden. It may be necessary for your caregiver to watch this illness with blood tests, either before or after diagnosis and treatment. TREATMENT Short-term treatment There are several treatments to control symptoms. Drugs called beta blockers may give some relief. Drugs that decrease hormone production will provide temporary relief in many people. These measures will usually not give permanent relief. Definitive therapy There are treatments available which can be discussed between you and your caregiver which will permanently treat the problem. These treatments range from surgery (removal of the thyroid), to the use of radioactive iodine (destroys the thyroid by radiation), to the use of antithyroid drugs (interfere with hormone synthesis). The first two treatments are permanent and  usually successful. They most often require hormone replacement therapy for life. This is because it is impossible to remove or destroy the exact amount of thyroid required to make a person euthyroid (normal). HOME CARE INSTRUCTIONS  See your caregiver if the problems you are being treated for get worse. Examples of this would be the problems listed above. SEEK MEDICAL CARE IF: Your general condition worsens. MAKE SURE YOU:   Understand these instructions.  Will watch your condition.  Will get help right away if you are not doing well or get worse. Document Released: 07/23/2005 Document Revised: 10/15/2011 Document Reviewed: 12/04/2006 [facility] Patient Information 2014 [place], [place].

## 2013-06-30 ENCOUNTER — Ambulatory Visit (INDEPENDENT_AMBULATORY_CARE_PROVIDER_SITE_OTHER): Payer: Medicare Other | Admitting: Internal Medicine

## 2013-06-30 ENCOUNTER — Encounter: Payer: Self-pay | Admitting: Internal Medicine

## 2013-06-30 ENCOUNTER — Telehealth: Payer: Self-pay

## 2013-06-30 ENCOUNTER — Encounter: Payer: Self-pay | Admitting: Cardiology

## 2013-06-30 ENCOUNTER — Other Ambulatory Visit: Payer: Self-pay

## 2013-06-30 VITALS — BP 116/72 | HR 55 | Temp 97.6°F | Resp 10 | Wt 132.7 lb

## 2013-06-30 DIAGNOSIS — E0590 Thyrotoxicosis, unspecified without thyrotoxic crisis or storm: Secondary | ICD-10-CM

## 2013-06-30 MED ORDER — METHIMAZOLE 5 MG PO TABS: 5.0000 mg | ORAL_TABLET | Freq: Two times a day (BID) | ORAL | Status: DC

## 2013-06-30 MED ORDER — APIXABAN 2.5 MG PO TABS: 2.5000 mg | ORAL_TABLET | Freq: Two times a day (BID) | ORAL | Status: DC

## 2013-06-30 NOTE — Telephone Encounter (Signed)
Per Dr Myrtis Ser the pt is advised to start taking Eliquis 2.5 mg BID, she verbalized understanding and said that if she can afford to take she will. I called pts pharmacy, [facility], to get cost of this medication for a 90 day supply and was told the estimated cost is $60.00. The pt was advised of cost and she agrees to try Eliquis. RX sent to [facility] to fill. Also, Eliquis samples have been left at the front desk for pt to pick up, she is aware.

## 2013-06-30 NOTE — Progress Notes (Addendum)
The patient has now been seen by endocrinology and internal medicine. The diagnosis of hyperthyroidism has been confirmed. She cannot be treated yet with radioactive iodine because she received iodine at the time of cardiac catheterization. Other medicines are being started. Eventually, she will probably receive radioactive iodine.  It is possible that the atrial fibrillation that we saw in the hospital is related to her hyperthyroidism. I've decided to anticoagulate her during this period of hyperthyroidism with the potential atrial fibrillation risk. When her thyroid is completely stable I will consider monitor analysis to see if any other atrial fibrillation is present. After that I will make a final decision about whether longer-term anticoagulation is needed. I am hopeful that it will not be needed.  The patient had a subdural in the past it is completely gone. I carefully considered the dosing of medications. I decided to use a lower dose of Eliquis. 2.5 mg twice a day. I discussed this with the patient. We decided to look into the dosing of Xarelto and Eliquis and Coumadin and then decide which medicine we will use.  Jerral Bonito, MD

## 2013-06-30 NOTE — Progress Notes (Signed)
Patient ID: [name], female   DOB: 08-18-1940, 72 y.o.   MRN: [id number]   HPI  [name] is a 72 y.o.-year-old female, referred by her PCP, Dr. Fabian Sharp, for evaluation for thyrotoxicosis (urgent consult).  Patient was recently admitted to the hospital with Takotsubo cardiomyopathy, and a low TSH was found incidentally. Patient had the cardiac catheterization on 06/17/2013 (after blood for TSH was drawn). She was also found to have atrial fibrillation in the hospital, now resolved (was on Coreg in the past and continues now). The patient's EF also normalized before discharge.  She was scheduled to have a hospital followup appointment with her PCP. Dr. Fabian Sharp repeated a TSH level and added a free T4 and a free T3 and these returned abnormal, suggesting thyrotoxicosis. Of note, her pulse was in the 50s and she was not in atrial fibrillation at the time of the appointment yesterday. Patient was referred to endocrinology for for further management.  I reviewed pt's thyroid tests: Lab Results  Component Value Date   TSH 0.03* 06/26/2013   TSH <0.008* 06/17/2013   TSH 0.69 08/16/2011   TSH 2.12 10/08/2006   FREET4 2.38* 06/26/2013   FREET4 1.0 10/08/2006  fT3 on 06/26/2013: 6.4 (2.3 - 4.2)   Pt c/o: - + excessive sweating/heat intolerance - + tremors - + anxiety - + palpitations - + hyperdefecation - + weight loss (from 164 lbs in 02/2012 to 137 lbs this month) - + fatigue  Pt denies feeling nodules in neck, hoarseness, dysphagia/odynophagia, SOB with lying down.  Pt does not have a FH of thyroid ds. No FH of thyroid cancer. No h/o radiation tx to head or neck.  No seaweed or kelp, no iodine supplements. No steroid use recently - last inj in knee 1 year ago. + herbal supplements - Biotin and Evening Primrose.  She did have a recent catheterization, so she recently got a high iodine load.    I reviewed her chart and she also had higher LFTs during last admission. Last  set: Lab Results  Component Value Date   ALT 44* 06/18/2013   AST 43* 06/18/2013   ALKPHOS 62 06/18/2013   BILITOT 0.6 06/18/2013  Previous LFTs were normal.   She also has a h/o hypertension, hyperlipidemia, tricuspid regurgitation, mitral valve prolapse, PMR. ROS: Constitutional: see HPI Eyes: no blurry vision, no xerophthalmia ENT: no sore throat, no nodules palpated in throat, no dysphagia/odynophagia, no hoarseness Cardiovascular: no CP/SOB/palpitations/leg swelling Respiratory: no cough/SOB Gastrointestinal: no N/V/+D/no C Musculoskeletal: + muscle aches/no joint aches Skin: no rashes, + itching Neurological: + tremors/no numbness/tingling/+ dizziness (this summer - when she was on higher dose of Coreg = 3 tablets a day, now resolved) Psychiatric: + depression/improved anxiety  Past Medical History  Diagnosis Date  . Dyslipidemia   . Polymyalgia rheumatica   . HTN (hypertension)   . Mitral valve prolapse     echo, January, 2011, moderate prolapse with your leaflet, trivial MR   Antibiotic required for procedures  . Tricuspid regurgitation     mild to moderate, echo, January, 2011, PA pressure 38 mm mercury  . 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 Hyponatremia   . Chronic cystitis   . Hemorrhoids   . Hx of colonoscopy   . IBS (irritable bowel syndrome)   . History of colon polyps   . Ejection fraction     EF 65%, echo, 2011  . Potassium (K) deficiency  5 potassium requirement over time  . Syncope     August, 200 weight, dehydration  . 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 CIN I (cervical intraepithelial neoplasia I)   . Osteoporosis   . PONV (postoperative nausea and vomiting)   . Heart murmur     MVP   . Orthostasis     Mild orthostatic change when she stands  . Basal cell carcinoma of nose     removed w/MOHs  . Osteoarthritis     "do not have RA" (06/17/2013)  . CAD (coronary  artery disease)     a. NSTEMI 06/2013 => LHC:  mLAD 40, oD2 70 (small), pOM 20, mRCA 20, mid to dist ant HK, EF 30-35% (c/w Tako-Tsubo CM)  . 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 EF 30-35% at [facility] 06/2013;  b.  f/u Echo (06/18/13):  EF 60-65%, normal wall motion, Gr 1 DD, mild MVP of post leaflet, mod TR, PASP 63  . Atrial fibrillation   . Abnormal EKG    Past Surgical History  Procedure Laterality Date  . Excisional hemorrhoidectomy  2003  . Mohs surgery Left 2011    "side of my nose" (06/17/2013)  . Total knee arthroplasty  08/2010  . Colonoscopy  12/23/2003    normal (indications: prior adenomas and grandfather with colon cancer)  . Colposcopy    . Tonsillectomy    . Total knee arthroplasty  02/25/2012    Procedure: TOTAL KNEE ARTHROPLASTY;  Surgeon: Loanne Drilling, MD;  Location: [facility];  Service: Orthopedics;  Laterality: Right;  . Cardiac catheterization  06/17/2013   History   Social History  . Marital Status: Married    Spouse Name: N/A    Number of Children: 1   Occupational History  . Homemaker    Social History Main Topics  . Smoking status: Never Smoker   . Smokeless tobacco: Never Used  . Alcohol Use: No  . Drug Use: No  . Sexual Activity: No  Exercising 3 times a week - Silver sneakers  Social History Narrative   3 caffeine drinks daily    Married   Retired   Current Outpatient Prescriptions on File Prior to Visit  Medication Sig Dispense Refill  . amitriptyline (ELAVIL) 25 MG tablet Take 25 mg by mouth at bedtime.       Marland Kitchen amLODipine (NORVASC) 5 MG tablet Take 1 tablet (5 mg total) by mouth daily with supper.  90 tablet  3  . aspirin EC 325 MG EC tablet Take 1 tablet (325 mg total) by mouth daily.      Marland Kitchen atorvastatin (LIPITOR) 10 MG tablet Take 10 mg by mouth at bedtime.      . Biotin 5000 MCG CAPS Take 1 capsule by mouth daily.       . Calcium Carbonate (CALTRATE 600 PO) Take 1 tablet by mouth daily.       . carvedilol (COREG) 12.5 MG tablet Take 1  tablet (12.5 mg total) by mouth 2 (two) times daily with a meal.  180 tablet  3  . Cholecalciferol (D3-1000) 1000 UNITS capsule Take 1,000 Units by mouth daily.      . clindamycin (CLEOCIN T) 1 % lotion Apply 1 application topically daily.      . enalapril (VASOTEC) 20 MG tablet Take 1 tablet (20 mg total) by mouth 2 (two) times daily.  180 tablet  3  . EVENING PRIMROSE OIL PO Take 1  capsule by mouth daily.       . furosemide (LASIX) 20 MG tablet Take 1 tablet (20 mg total) by mouth daily with breakfast.  90 tablet  2  . meloxicam (MOBIC) 15 MG tablet Take 15 mg by mouth daily.      . methylcellulose packet Take 1 each by mouth daily.       . nitroGLYCERIN (NITROSTAT) 0.4 MG SL tablet Place 1 tablet (0.4 mg total) under the tongue every 5 (five) minutes as needed for chest pain.  25 tablet  12  . potassium chloride SA (K-DUR,KLOR-CON) 20 MEQ tablet Patient takes 2 tablets every morning and 2 at lunch and 1 tablet at dinner  320 tablet  3   No current facility-administered medications on file prior to visit.   Allergies  Allergen Reactions  . Codeine Nausea And Vomiting    Some sensitivity   Family History  Problem Relation Age of Onset  . Heart attack Mother 23  . Hypertension Mother   . Heart disease Mother   . Stroke Mother   . Heart attack Father 3  . Coronary artery disease Father     Multiple Family Members  . Hypertension Father   . Heart disease Father   . Esophageal cancer Paternal Grandmother   . Colon cancer Maternal Grandfather   . Heart disease Maternal Grandfather   . Irritable bowel syndrome Sister     More family members on father side of   . Hypertension Sister   . Heart disease Sister   . Breast cancer Paternal Aunt     Age 65's  . Heart disease Paternal Grandfather    PE: BP 116/72  Pulse 55  Temp(Src) 97.6 F (36.4 C) (Oral)  Resp 10  Wt 132 lb 11.2 oz (60.192 kg)  SpO2 97% Wt Readings from Last 3 Encounters:  06/30/13 132 lb 11.2 oz (60.192 kg)   06/29/13 132 lb (59.875 kg)  06/26/13 130 lb (58.968 kg)  Body mass index is 22.77 kg/(m^2).  Constitutional: normal weight (appears thin), in NAD Eyes: PERRLA, EOMI, no exophthalmos, no lid lag, no stare ENT: moist mucous membranes, no thyromegaly, no thyroid bruits, no cervical lymphadenopathy Cardiovascular: RRR, No MRG Respiratory: CTA B Gastrointestinal: abdomen soft, NT, ND, BS+ Musculoskeletal: no deformities, strength intact in all 4 Skin: moist, warm, no rashes Neurological: + tremor with outstretched hands, could not perform DTR - patient with TKR in both knees and she refused DTR in the right arm do to recent catheterization)  ASSESSMENT: 1. Thyrotoxycosis  PLAN:  1. Patient with a recently found thyrotoxicosis, with thyrotoxic sxs: weight loss, heat intolerance, hyperdefecation, palpitations, anxiety.  - she does not appear to have exogenous causes for the low TSH (the cardiac catheterization was performed after the first abnormal TSH was drawn)  - We discussed that possible causes of thyrotoxicosis are:  Graves ds   Thyroiditis toxic multinodular goiter/ toxic adenoma (I cannot feel nodules at palpation of his thyroid). - the only way to differentiate between the above is to perform an uptake and scan, however due to her recent high iodine load during her heart catheterization, we cannot perform the uptake and scan yet. She would probably eliminate the iodine in the next one to 2 months, so this would need to be done at that time - we discussed about possible modalities of treatment for the above conditions, to include methimazole use, radioactive iodine ablation or surgery. As of now, I would suggest that we start  methimazole, which we can always stop to perform the uptake and scan if still necessary in the future. We will start at 5 mg bid. Advised about possible SEs - and also when to stop MMI and call us (see pt instr). - she is on a beta blocker >> pulse normal today -  continue Coreg - RTC in 1.5 months - we will check thyroid tests, TSI, and spot urinary iodine at that time, and decide if we can/need to do the thyroid uptake and scan.

## 2013-06-30 NOTE — Patient Instructions (Signed)
Please start Methimazole 5 mg 2x a day: with b'fast and dinner.  Please stop the Methimazole (Tapazole) and call us or your primary care doctor if you develop: - sore throat - fever - yellow skin - dark urine - light colored stools As we will then need to check your blood counts and liver tests.  Please return in 2 months for labs, before the next appointment. I will order the labs and you can go to [facility] to have labs drawn, if easier for you.

## 2013-07-06 ENCOUNTER — Other Ambulatory Visit: Payer: Self-pay | Admitting: Cardiology

## 2013-07-08 ENCOUNTER — Ambulatory Visit: Payer: Medicare Other | Admitting: Cardiology

## 2013-07-09 ENCOUNTER — Encounter (HOSPITAL_COMMUNITY)
Admission: RE | Admit: 2013-07-09 | Discharge: 2013-07-09 | Disposition: A | Discharge status: Home / Self Care | Payer: Medicare Other | Source: Ambulatory Visit | Attending: Cardiology | Admitting: Cardiology

## 2013-07-09 ENCOUNTER — Telehealth: Payer: Self-pay | Admitting: Cardiology

## 2013-07-09 DIAGNOSIS — I214 Non-ST elevation (NSTEMI) myocardial infarction: Secondary | ICD-10-CM | POA: Insufficient documentation

## 2013-07-09 DIAGNOSIS — Z5189 Encounter for other specified aftercare: Secondary | ICD-10-CM | POA: Insufficient documentation

## 2013-07-09 NOTE — Telephone Encounter (Signed)
New message     Refill carvedilol 12.5------[facility]

## 2013-07-09 NOTE — Progress Notes (Signed)
Cardiac Rehab Medication Review by a Pharmacist  Does the patient  feel that his/her medications are working for him/her?  yes  Has the patient been experiencing any side effects to the medications prescribed?  no  Does the patient measure his/her own blood pressure or blood glucose at home?  no   Does the patient have any problems obtaining medications due to transportation or finances?   no  Understanding of regimen: good Understanding of indications: good Potential of compliance: excellent    Pharmacist comments:  42 yoF who presents in good spirits this morning with list of medications, up to date. Patient complains of no side effects or concerns. Newly dx hyperthyroidism on methimazole. Other new medications include apixaban 2.5mg  BID. Patient is very well educated and can use medical terminology in conversation.  I have updated all medications and allergies.   [name]. [name], PharmD Clinical Pharmacist - Resident Pager: [phone] Phone: [phone] 07/09/2013 9:28 AM

## 2013-07-10 NOTE — Telephone Encounter (Signed)
Called pharmacy and they stated that they had the rx on file for patient that was sent to them back in July. The patient needs to call them to let them know that she needs an order shipped. Patient aware.

## 2013-07-13 ENCOUNTER — Encounter (HOSPITAL_COMMUNITY)
Admission: RE | Admit: 2013-07-13 | Discharge: 2013-07-13 | Disposition: A | Discharge status: Home / Self Care | Payer: Medicare Other | Source: Ambulatory Visit | Attending: Cardiology | Admitting: Cardiology

## 2013-07-13 ENCOUNTER — Encounter (HOSPITAL_COMMUNITY): Payer: Medicare Other

## 2013-07-13 ENCOUNTER — Telehealth: Payer: Self-pay | Admitting: Physician Assistant

## 2013-07-13 DIAGNOSIS — I214 Non-ST elevation (NSTEMI) myocardial infarction: Secondary | ICD-10-CM | POA: Diagnosis not present

## 2013-07-13 DIAGNOSIS — Z5189 Encounter for other specified aftercare: Secondary | ICD-10-CM | POA: Diagnosis not present

## 2013-07-13 NOTE — Telephone Encounter (Signed)
[name] with cardiac rehab called because pt's HR 40s at times.   Orthostatics not strongly positive. Pt denies dizziness, presyncope.   Lowest observed HR is 47.   Pt has appt with JK 12/10.  Spoke with pt by phone. She denies symptoms.  Continue current therapies. Keep f/u appt.

## 2013-07-13 NOTE — Progress Notes (Signed)
[name] is here today for her first day of exercise at cardiac rehab.  Telemetry rhythm is sinus brady with T wave inversion. I am unable to pull up the 12 lead ECG from [name]'s office visit on 06/26/2013 with Dr Myrtis Ser. [name]'s heart rate was 50 standing and 47 sitting at rest. [name] was asymptomatic. Sitting blood pressure 124/62 Standing blood pressure 118/60.  [name] PAC called and notified of bradycardia and T wave inversion. [name] said as long as [name] is asymptomatic, she could exercise today. [name]'s blood pressure were stable today. [name] exercise without difficulty. [name] did mention having some soreness in her lower legs which she noticed yesterday. Will continue to monitor the patient throughout  the program.

## 2013-07-14 ENCOUNTER — Other Ambulatory Visit: Payer: Self-pay | Admitting: *Deleted

## 2013-07-14 ENCOUNTER — Telehealth: Payer: Self-pay | Admitting: Cardiology

## 2013-07-14 MED ORDER — APIXABAN 2.5 MG PO TABS: 2.5000 mg | ORAL_TABLET | Freq: Two times a day (BID) | ORAL | Status: DC

## 2013-07-14 NOTE — Telephone Encounter (Deleted)
error 

## 2013-07-15 ENCOUNTER — Encounter (HOSPITAL_COMMUNITY)
Admission: RE | Admit: 2013-07-15 | Discharge: 2013-07-15 | Disposition: A | Discharge status: Home / Self Care | Payer: Medicare Other | Source: Ambulatory Visit | Attending: Cardiology | Admitting: Cardiology

## 2013-07-15 ENCOUNTER — Encounter (HOSPITAL_COMMUNITY): Payer: Medicare Other

## 2013-07-16 NOTE — Progress Notes (Signed)
Reviewed [name]'s quality of life questionnaire with the patient. [name] has low scores in overall, psychosocial spiritual and family. [name] would like to talk with hospital chaplain/ Rabbi. Hospital Rabbi to contact the patient to set up an appointment for  A meeting in the near future. Emotional support given. Will forward questionnaire to Dr Myrtis Ser for review.

## 2013-07-17 ENCOUNTER — Encounter (HOSPITAL_COMMUNITY): Payer: Medicare Other

## 2013-07-17 ENCOUNTER — Encounter (HOSPITAL_COMMUNITY)
Admission: RE | Admit: 2013-07-17 | Discharge: 2013-07-17 | Disposition: A | Discharge status: Home / Self Care | Payer: Medicare Other | Source: Ambulatory Visit | Attending: Cardiology | Admitting: Cardiology

## 2013-07-17 ENCOUNTER — Telehealth: Payer: Self-pay

## 2013-07-17 ENCOUNTER — Encounter: Payer: Self-pay | Admitting: Cardiology

## 2013-07-17 MED ORDER — CARVEDILOL 12.5 MG PO TABS: 6.2500 mg | ORAL_TABLET | Freq: Two times a day (BID) | ORAL | Status: DC

## 2013-07-17 NOTE — Progress Notes (Signed)
    After review cardiac rehabilitation notes I have decided to lower her carvedilol dose from 12.5 twice a day to 6.25 twice a day.  Jerral Bonito, MD

## 2013-07-17 NOTE — Telephone Encounter (Signed)
pt instructed per Dr.Katz  reduce carvedilol  to 6.25 bid.pt will score here 12.5mg  and take 0.5 tablet bid. pt verbalized understanding

## 2013-07-20 ENCOUNTER — Encounter: Payer: Self-pay | Admitting: Cardiology

## 2013-07-20 ENCOUNTER — Encounter (HOSPITAL_COMMUNITY)
Admission: RE | Admit: 2013-07-20 | Discharge: 2013-07-20 | Disposition: A | Discharge status: Home / Self Care | Payer: Medicare Other | Source: Ambulatory Visit | Attending: Cardiology | Admitting: Cardiology

## 2013-07-20 ENCOUNTER — Encounter (HOSPITAL_COMMUNITY): Payer: Medicare Other

## 2013-07-20 ENCOUNTER — Ambulatory Visit (INDEPENDENT_AMBULATORY_CARE_PROVIDER_SITE_OTHER): Payer: Medicare Other | Admitting: Cardiology

## 2013-07-20 VITALS — BP 164/74 | HR 59 | Ht 64.0 in | Wt 134.0 lb

## 2013-07-20 DIAGNOSIS — I5181 Takotsubo syndrome: Secondary | ICD-10-CM

## 2013-07-20 DIAGNOSIS — I951 Orthostatic hypotension: Secondary | ICD-10-CM | POA: Diagnosis not present

## 2013-07-20 DIAGNOSIS — I4891 Unspecified atrial fibrillation: Secondary | ICD-10-CM | POA: Diagnosis not present

## 2013-07-20 DIAGNOSIS — E0590 Thyrotoxicosis, unspecified without thyrotoxic crisis or storm: Secondary | ICD-10-CM | POA: Diagnosis not present

## 2013-07-20 DIAGNOSIS — I10 Essential (primary) hypertension: Secondary | ICD-10-CM

## 2013-07-20 DIAGNOSIS — Z7901 Long term (current) use of anticoagulants: Secondary | ICD-10-CM

## 2013-07-20 MED ORDER — AMLODIPINE BESYLATE 5 MG PO TABS: 5.0000 mg | ORAL_TABLET | Freq: Every day | ORAL | Status: DC

## 2013-07-20 NOTE — Assessment & Plan Note (Signed)
The patient is taking a low dose of anticoagulation at this time. I will consider stopping it after her thyroid is completely stabilized.

## 2013-07-20 NOTE — Assessment & Plan Note (Signed)
At this time she's not having any signs or significant orthostatic hypotension.

## 2013-07-20 NOTE — Progress Notes (Signed)
HPI Patient is seen today to followup her episode of Takotsubo cardiomyopathy. When I saw her last in the office on June 26, 2013, she was post hospitalization. Her left ventricular function had normalized. She had marked diffuse ST-T wave changes. I also noted that her thyroid functions were markedly abnormal. She saw her primary physician and she saw endocrinology. She is receiving methimazole. She cannot yet received radioactive iodine because she received iodine at the time of her catheterization. There will be further decisions about possible thyroid scanning in radioactive iodine.  Despite her hyperthyroidism she had significant resting bradycardia. Recently I chose to lower her carvedilol dose from 12.5 twice a day to 6.5 twice a day. Her heart rate came up to a reasonable resting range and she is doing well. Also she has stopped losing weight.  Allergies  Allergen Reactions  . Codeine Nausea And Vomiting    Current Outpatient Prescriptions  Medication Sig Dispense Refill  . amitriptyline (ELAVIL) 25 MG tablet Take 25 mg by mouth at bedtime.       Marland Kitchen amLODipine (NORVASC) 5 MG tablet Take 5 mg by mouth daily with supper. Takes 2.5      . apixaban (ELIQUIS) 2.5 MG TABS tablet Take 1 tablet (2.5 mg total) by mouth 2 (two) times daily.  180 tablet  2  . aspirin EC 325 MG EC tablet Take 1 tablet (325 mg total) by mouth daily.      Marland Kitchen atorvastatin (LIPITOR) 10 MG tablet Take 10 mg by mouth at bedtime.      . Biotin 5000 MCG CAPS Take 1 capsule by mouth daily.       . Calcium Carbonate (CALTRATE 600 PO) Take 1 tablet by mouth daily.       . carvedilol (COREG) 12.5 MG tablet Take 0.5 tablets (6.25 mg total) by mouth 2 (two) times daily with a meal.      . Cholecalciferol (D3-1000) 1000 UNITS capsule Take 1,000 Units by mouth daily.      . clindamycin (CLEOCIN T) 1 % lotion Apply 1 application topically daily.      . enalapril (VASOTEC) 20 MG tablet Take 1 tablet (20 mg total) by mouth 2  (two) times daily.  180 tablet  3  . EVENING PRIMROSE OIL PO Take 1 capsule by mouth daily.       . furosemide (LASIX) 20 MG tablet Take 1 tablet (20 mg total) by mouth daily with breakfast.  90 tablet  2  . meloxicam (MOBIC) 15 MG tablet Take 15 mg by mouth daily.      . methimazole (TAPAZOLE) 5 MG tablet Take 1 tablet (5 mg total) by mouth 2 (two) times daily.  100 tablet  1  . methylcellulose packet Take 1 each by mouth daily.       . nitroGLYCERIN (NITROSTAT) 0.4 MG SL tablet Place 1 tablet (0.4 mg total) under the tongue every 5 (five) minutes as needed for chest pain.  25 tablet  12  . potassium chloride SA (K-DUR,KLOR-CON) 20 MEQ tablet Patient takes 2 tablets every morning and 2 at lunch and 1 tablet at dinner  320 tablet  3   No current facility-administered medications for this visit.    History   Social History  . Marital Status: Married    Spouse Name: N/A    Number of Children: 1  . Years of Education: N/A   Occupational History  . Homemaker    Social History Main Topics  .  Smoking status: Never Smoker   . Smokeless tobacco: Never Used  . Alcohol Use: No  . Drug Use: No  . Sexual Activity: No   Other Topics Concern  . Not on file   Social History Narrative   3 caffeine drinks daily    Married   Retired          Family History  Problem Relation Age of Onset  . Heart attack Mother 46  . Hypertension Mother   . Heart disease Mother   . Stroke Mother   . Heart attack Father 23  . Coronary artery disease Father     Multiple Family Members  . Hypertension Father   . Heart disease Father   . Esophageal cancer Paternal Grandmother   . Colon cancer Maternal Grandfather   . Heart disease Maternal Grandfather   . Irritable bowel syndrome Sister     More family members on father side of   . Hypertension Sister   . Heart disease Sister   . Breast cancer Paternal Aunt     Age 53's  . Heart disease Paternal Grandfather     Past Medical History  Diagnosis  Date  . Dyslipidemia   . Polymyalgia rheumatica   . HTN (hypertension)   . Mitral valve prolapse     echo, January, 2011, moderate prolapse with your leaflet, trivial MR   Antibiotic required for procedures  . Tricuspid regurgitation     mild to moderate, echo, January, 2011, PA pressure 38 mm mercury  . 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 Hyponatremia   . Chronic cystitis   . Hemorrhoids   . Hx of colonoscopy   . IBS (irritable bowel syndrome)   . History of colon polyps   . Ejection fraction     EF 65%, echo, 2011  . Potassium (K) deficiency     5 potassium requirement over time  . Syncope     August, 200 weight, dehydration  . 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 CIN I (cervical intraepithelial neoplasia I)   . Osteoporosis   . PONV (postoperative nausea and vomiting)   . Heart murmur     MVP   . Orthostasis     Mild orthostatic change when she stands  . Basal cell carcinoma of nose     removed w/MOHs  . Osteoarthritis     "do not have RA" (06/17/2013)  . CAD (coronary artery disease)     a. NSTEMI 06/2013 => LHC:  mLAD 40, oD2 70 (small), pOM 20, mRCA 20, mid to dist ant HK, EF 30-35% (c/w Tako-Tsubo CM)  . 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 EF 30-35% at [facility] 06/2013;  b.  f/u Echo (06/18/13):  EF 60-65%, normal wall motion, Gr 1 DD, mild MVP of post leaflet, mod TR, PASP 63  . Atrial fibrillation   . Abnormal EKG     Past Surgical History  Procedure Laterality Date  . Excisional hemorrhoidectomy  2003  . Mohs surgery Left 2011    "side of my nose" (06/17/2013)  . Total knee arthroplasty  08/2010  . Colonoscopy  12/23/2003    normal (indications: prior adenomas and grandfather with colon cancer)  . Colposcopy    . Tonsillectomy    . Total knee arthroplasty  02/25/2012    Procedure: TOTAL KNEE ARTHROPLASTY;  Surgeon: Loanne Drilling, MD;  Location: [facility];  Service: Orthopedics;   Laterality: Right;  . Cardiac catheterization  06/17/2013    Patient Active Problem List   Diagnosis Date Noted  . Dyslipidemia     Priority: High  . Ejection fraction     Priority: High  . Potassium (K) deficiency     Priority: High  . Hyperthyroidism 06/29/2013  . 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 Abnormal EKG   . Takotsubo syndrome 06/19/2013  . Atrial fibrillation 06/19/2013  . Orthostasis   . H/O dizziness 12/26/2012  . Abnormal LFTs 12/26/2012  . NSAID long-term use 12/05/2012  . Acute epigastric pain 12/05/2012  . CIN I (cervical intraepithelial neoplasia I)   . Ringing in ears 05/09/2011  . HTN (hypertension)   . Mitral valve prolapse   . Tricuspid regurgitation   . Subdural hematoma   . Syncope   . Personal history of colonic polyps 02/06/2011  . Personal history of failed moderate sedation 02/06/2011  . RHINITIS 08/23/2010  . ARTHRITIS, KNEE 08/23/2010  . SKIN CANCER, HX OF 02/24/2010  . FX CLSD SKL VLT W/HEM NEC, CONCUSSION NOS 04/07/2007  . POLYMYALGIA RHEUMATICA 04/02/2007  . OSTEOPOROSIS 03/11/2007    ROS   Patient denies fever, chills, headache, sweats, rash, change in vision, change in hearing, chest pain, cough, nausea vomiting, urinary symptoms. All other systems are reviewed and are negative.  PHYSICAL EXAM  Patient is oriented to person time and place. Affect is normal. There is no jugulovenous distention. Lungs are clear. Respiratory effort is not labored. Cardiac exam her vitals S1 and S2. There no clicks or significant murmurs. The abdomen is soft. There is no peripheral edema.  Filed Vitals:   07/20/13 1515  BP: 164/74  Pulse: 59  Height: 5\' 4"  (1.626 m)  Weight: 134 lb (60.782 kg)   EKG is done today and reviewed by me. She has sinus rhythm. She has persistent marked diffuse ST-T wave changes. However they look slightly better than the prior tracing.  ASSESSMENT & PLAN

## 2013-07-20 NOTE — Assessment & Plan Note (Signed)
Blood pressure is a little higher today after we lowered her carvedilol dose. She will go back to her amlodipine 5 mg. I explained to her that we will adjust meds over time based on her heart rate and her pressure as she is being treated for her hyperthyroidism.

## 2013-07-20 NOTE — Patient Instructions (Signed)
Your physician recommends that you schedule a follow-up appointment in: about 4 weeks.   Amlodipine dose is 5 mg by mouth daily

## 2013-07-20 NOTE — Assessment & Plan Note (Signed)
She had normalization of LV function shortly after her episode. Her EKG remains abnormal. There is no reason to do a followup echo at this time. I will consider this a later date.

## 2013-07-20 NOTE — Assessment & Plan Note (Signed)
She had very brief episode while hospitalized. While she is hyperthyroid I'm using low-dose anticoagulation. I will consider stopping this later.

## 2013-07-20 NOTE — Assessment & Plan Note (Signed)
Patient is receiving excellent treatment following with endocrinology.

## 2013-07-22 ENCOUNTER — Encounter (HOSPITAL_COMMUNITY): Payer: Medicare Other

## 2013-07-22 ENCOUNTER — Encounter (HOSPITAL_COMMUNITY)
Admission: RE | Admit: 2013-07-22 | Discharge: 2013-07-22 | Disposition: A | Discharge status: Home / Self Care | Payer: Medicare Other | Source: Ambulatory Visit | Attending: Cardiology | Admitting: Cardiology

## 2013-07-22 NOTE — Progress Notes (Signed)
Reviewed home exercise with pt today.  Pt plans to walk at home and to return to silver sneakers for exercise.  Pt expressed that she was nervous/scared to exercise on her own since her knee replacements.  She felt things may be to hard or painful.  I encouraged her to try and return to walking outside as well as her aerobics class while still in the program so that we could help her monitor progression.  Reviewed THR, pulse, RPE, sign and symptoms, NTG use, and when to call 911 or MD.  Also, pt was encouraged to make sure that she takes her NTG with her everywhere.  Pt voiced understanding. Fabio Pierce, MA, ACSM RCEP

## 2013-07-22 NOTE — Telephone Encounter (Signed)
New problem   Pt need samples of Eliquis 2.5 mg. Please advise pt

## 2013-07-22 NOTE — Telephone Encounter (Signed)
I spoke with pt & she has still not received her Eliquis from [facility]. I called [facility] & they would neither confirm nor deny that we went in a prescription for the Eliquis on 07/14/13.   Darrius, [facility], stated that without the pt permission he could not tell us anything!  I returned call to pt & she had been told by [facility] they could not fill her Eliquis until she gave Medicare permission. She was unaware of this & received no notification about this. She did give them permission. They have " just stressed me out" with this new rule  I have placed samples for pt at the desk for pick up on Friday. Mylo Red RN

## 2013-07-24 ENCOUNTER — Encounter (HOSPITAL_COMMUNITY): Payer: Medicare Other

## 2013-07-24 ENCOUNTER — Encounter (HOSPITAL_COMMUNITY)
Admission: RE | Admit: 2013-07-24 | Discharge: 2013-07-24 | Disposition: A | Discharge status: Home / Self Care | Payer: Medicare Other | Source: Ambulatory Visit | Attending: Cardiology | Admitting: Cardiology

## 2013-07-24 NOTE — Telephone Encounter (Signed)
Has this issue been resolved?

## 2013-07-24 NOTE — Telephone Encounter (Signed)
I spoke with pt & she has picked up Eliquis  samples from our office. She has still not received the mail order prescription yet. She will call back if continues to have difficulty with this matter Mylo Red RN

## 2013-07-24 NOTE — Telephone Encounter (Signed)
OK 

## 2013-07-24 NOTE — Progress Notes (Signed)
[name] 72 y.o. female Nutrition Note Spoke with pt.  Nutrition Plan and Nutrition Survey goals reviewed with pt. Pt is following Step 2 of the Therapeutic Lifestyle Changes diet. Pt reports she has lost 30 lb over the past year due to undiagnosed hypothyroidism. Pt expects she will gain some of the wt she lost back when she starts her thyroid medication. Pt would like to maintain her current wt if possible. Pt expressed understanding of the information reviewed. Pt aware of nutrition education classes offered and plans on attending nutrition classes. Nutrition Diagnosis   Food-and nutrition-related knowledge deficit related to lack of exposure to information as related to diagnosis of: ? CVD   Nutrition RX/ Estimated Daily Nutrition Needs for: wt maintenance 1600-1800 Kcal, 50-60 gm fat, 10-12 gm sat fat, 1.5-1.8 gm trans-fat, <1500 mg sodium   Nutrition Intervention   Pt's individual nutrition plan including cholesterol goals reviewed with pt.   Benefits of adopting Therapeutic Lifestyle Changes discussed when Medficts reviewed.   Pt to attend the Portion Distortion class   Pt to attend the  ? Nutrition I class                     ? Nutrition II class   Continue client-centered nutrition education by RD, as part of interdisciplinary care.  Goal(s)   Pt to describe the benefit of including fruits, vegetables, whole grains, and low-fat dairy products in a heart healthy meal plan.  Monitor and Evaluate progress toward nutrition goal with team. Nutrition Risk:  Low   Mickle Plumb, M.Ed, RD, LDN, CDE 07/24/2013 10:34 AM

## 2013-07-27 ENCOUNTER — Encounter (HOSPITAL_COMMUNITY): Payer: Medicare Other

## 2013-07-27 ENCOUNTER — Encounter (HOSPITAL_COMMUNITY)
Admission: RE | Admit: 2013-07-27 | Discharge: 2013-07-27 | Disposition: A | Discharge status: Home / Self Care | Payer: Medicare Other | Source: Ambulatory Visit | Attending: Cardiology | Admitting: Cardiology

## 2013-07-27 NOTE — Addendum Note (Signed)
Addended by: Eustace Moore on: 07/27/2013 01:08 PM   Modules accepted: Orders

## 2013-07-27 NOTE — Progress Notes (Signed)
Nutrition Note Spoke with pt. Pt requests handouts for Nutrition I and II classes. Handouts given. Continue client-centered nutrition education by RD as part of interdisciplinary care.  Monitor and evaluate progress toward nutrition goal with team.  Mickle Plumb, M.Ed, RD, LDN, CDE 07/27/2013 11:10 AM

## 2013-07-29 ENCOUNTER — Encounter (HOSPITAL_COMMUNITY): Payer: Medicare Other

## 2013-07-29 ENCOUNTER — Ambulatory Visit (INDEPENDENT_AMBULATORY_CARE_PROVIDER_SITE_OTHER): Payer: Medicare Other | Admitting: Gynecology

## 2013-07-29 ENCOUNTER — Encounter (HOSPITAL_COMMUNITY)
Admission: RE | Admit: 2013-07-29 | Discharge: 2013-07-29 | Disposition: A | Discharge status: Home / Self Care | Payer: Medicare Other | Source: Ambulatory Visit | Attending: Cardiology | Admitting: Cardiology

## 2013-07-29 ENCOUNTER — Encounter: Payer: Self-pay | Admitting: Gynecology

## 2013-07-29 VITALS — BP 106/70 | Ht 64.5 in | Wt 134.8 lb

## 2013-07-29 DIAGNOSIS — M810 Age-related osteoporosis without current pathological fracture: Secondary | ICD-10-CM | POA: Diagnosis not present

## 2013-07-29 DIAGNOSIS — N952 Postmenopausal atrophic vaginitis: Secondary | ICD-10-CM | POA: Diagnosis not present

## 2013-07-29 NOTE — Progress Notes (Signed)
[name] 08-20-1940 [id number]        72 y.o.  G3P3001 for followup exam.  Former patient of Dr. Eda Paschal. Several issues noted below.  Past medical history,surgical history, problem list, medications, allergies, family history and social history were all reviewed and documented in the EPIC chart.  ROS:  Performed and pertinent positives and negatives are included in the history, assessment and plan .  Exam: Sherrilyn Rist assistant Filed Vitals:   07/29/13 1253  BP: 106/70  Height: 5' 4.5" (1.638 m)  Weight: 134 lb 12.8 oz (61.145 kg)   General appearance  Normal Skin grossly normal Head/Neck normal with no cervical or supraclavicular adenopathy thyroid normal Lungs  clear Cardiac RR, without RMG Abdominal  soft, nontender, without masses, organomegaly or hernia Breasts  examined lying and sitting without masses, retractions, discharge or axillary adenopathy. Pelvic  Ext/BUS/vagina  Normal with generalized atrophic changes  Cervix  Normal with atrophic changes  Uterus  anteverted, normal size, shape and contour, midline and mobile nontender   Adnexa  Without masses or tenderness    Anus and perineum  Normal   Rectovaginal  Normal sphincter tone without palpated masses or tenderness.    Assessment/Plan:  72 y.o. G3P3001 female for followup exam.   1. Postmenopausal/atrophic genital changes. Had previously been on HRT a number of years ago but has discontinued and doing well without significant hot flashes, night sweats or vaginal dryness. She is not sexually active. No vaginal bleeding. Will continue to monitor. Call if any vaginal bleeding. 2. Osteoporosis. Followed by Dr. Adah Salvage in [place]. DEXA 12/2011 T score -2.4 but does have clinical fracture history leading to diagnosis of osteoporosis. Had transiently been on oral bisphosphate but continued to have bone loss and now is on IV Reclast started 2010 per Dr. Verl Dicker note. Recommend patient followup with Dr. Adah Salvage  this spring for repeat DEXA and his recommendation as far as continuing Reclast or not. Patient agrees to arrange. Increase calcium vitamin D reviewed. 3. Pap smear 2012 normal. Dr. Eda Paschal has listed in medical history CIN-1 history but in his last annual exam noted diagnoses it lists CIN-2. Regardless this was 30+ years ago per his history. She's had normal Pap smears since then. Options to stop screening altogether as she is over the age of 42 versus less frequent screening interval reviewed and we'll readdress on an annual basis. 4. Mammography scheduled next week. Patient will followup for this. SBE monthly reviewed. 5. Colonoscopy 2012. Repeat at their recommended interval. 6. No blood work done as this is all done through her other physician's offices. Followup one year, sooner as needed.   Note: This document was prepared with digital dictation and possible smart phrase technology. Any transcriptional errors that result from this process are unintentional.   Dara Lords MD, 1:23 PM 07/29/2013

## 2013-07-29 NOTE — Patient Instructions (Signed)
follow up in one year for annual exam 

## 2013-07-30 ENCOUNTER — Other Ambulatory Visit: Payer: Self-pay | Admitting: Cardiology

## 2013-07-30 LAB — URINALYSIS W MICROSCOPIC + REFLEX CULTURE
Bacteria, UA: NONE SEEN
Bilirubin Urine: NEGATIVE
Casts: NONE SEEN
Crystals: NONE SEEN
Glucose, UA: NEGATIVE mg/dL
Hgb urine dipstick: NEGATIVE
Ketones, ur: NEGATIVE mg/dL
Leukocytes, UA: NEGATIVE
Nitrite: NEGATIVE
Protein, ur: NEGATIVE mg/dL
Specific Gravity, Urine: 1.007 (ref 1.005–1.030)
Squamous Epithelial / LPF: NONE SEEN
Urobilinogen, UA: 0.2 mg/dL (ref 0.0–1.0)
pH: 6 (ref 5.0–8.0)

## 2013-07-31 ENCOUNTER — Encounter (HOSPITAL_COMMUNITY): Payer: Medicare Other

## 2013-08-03 ENCOUNTER — Encounter (HOSPITAL_COMMUNITY): Payer: Medicare Other

## 2013-08-03 ENCOUNTER — Encounter (HOSPITAL_COMMUNITY)
Admission: RE | Admit: 2013-08-03 | Discharge: 2013-08-03 | Disposition: A | Discharge status: Home / Self Care | Payer: Medicare Other | Source: Ambulatory Visit | Attending: Cardiology | Admitting: Cardiology

## 2013-08-05 ENCOUNTER — Encounter (HOSPITAL_COMMUNITY): Payer: Medicare Other

## 2013-08-05 ENCOUNTER — Encounter (HOSPITAL_COMMUNITY)
Admission: RE | Admit: 2013-08-05 | Discharge: 2013-08-05 | Disposition: A | Discharge status: Home / Self Care | Payer: Medicare Other | Source: Ambulatory Visit | Attending: Cardiology | Admitting: Cardiology

## 2013-08-07 ENCOUNTER — Encounter (HOSPITAL_COMMUNITY)
Admission: RE | Admit: 2013-08-07 | Discharge: 2013-08-07 | Disposition: A | Discharge status: Home / Self Care | Payer: Medicare Other | Source: Ambulatory Visit | Attending: Cardiology | Admitting: Cardiology

## 2013-08-07 ENCOUNTER — Encounter (HOSPITAL_COMMUNITY): Payer: Medicare Other

## 2013-08-07 DIAGNOSIS — H40019 Open angle with borderline findings, low risk, unspecified eye: Secondary | ICD-10-CM | POA: Diagnosis not present

## 2013-08-07 DIAGNOSIS — H2510 Age-related nuclear cataract, unspecified eye: Secondary | ICD-10-CM | POA: Diagnosis not present

## 2013-08-07 DIAGNOSIS — Z5189 Encounter for other specified aftercare: Secondary | ICD-10-CM | POA: Diagnosis not present

## 2013-08-07 DIAGNOSIS — I214 Non-ST elevation (NSTEMI) myocardial infarction: Secondary | ICD-10-CM | POA: Insufficient documentation

## 2013-08-10 ENCOUNTER — Encounter (HOSPITAL_COMMUNITY): Payer: Medicare Other

## 2013-08-10 ENCOUNTER — Encounter (HOSPITAL_COMMUNITY)
Admission: RE | Admit: 2013-08-10 | Discharge: 2013-08-10 | Disposition: A | Discharge status: Home / Self Care | Payer: Medicare Other | Source: Ambulatory Visit | Attending: Cardiology | Admitting: Cardiology

## 2013-08-10 DIAGNOSIS — Z1231 Encounter for screening mammogram for malignant neoplasm of breast: Secondary | ICD-10-CM | POA: Diagnosis not present

## 2013-08-11 ENCOUNTER — Encounter: Payer: Self-pay | Admitting: Gynecology

## 2013-08-11 DIAGNOSIS — L03039 Cellulitis of unspecified toe: Secondary | ICD-10-CM | POA: Diagnosis not present

## 2013-08-11 DIAGNOSIS — M2040 Other hammer toe(s) (acquired), unspecified foot: Secondary | ICD-10-CM | POA: Diagnosis not present

## 2013-08-12 ENCOUNTER — Encounter (HOSPITAL_COMMUNITY): Payer: Medicare Other

## 2013-08-12 ENCOUNTER — Encounter (HOSPITAL_COMMUNITY)
Admission: RE | Admit: 2013-08-12 | Discharge: 2013-08-12 | Disposition: A | Discharge status: Home / Self Care | Payer: Medicare Other | Source: Ambulatory Visit | Attending: Cardiology | Admitting: Cardiology

## 2013-08-14 ENCOUNTER — Encounter (HOSPITAL_COMMUNITY): Payer: Medicare Other

## 2013-08-14 ENCOUNTER — Encounter (HOSPITAL_COMMUNITY)
Admission: RE | Admit: 2013-08-14 | Discharge: 2013-08-14 | Disposition: A | Discharge status: Home / Self Care | Payer: Medicare Other | Source: Ambulatory Visit | Attending: Cardiology | Admitting: Cardiology

## 2013-08-17 ENCOUNTER — Ambulatory Visit (INDEPENDENT_AMBULATORY_CARE_PROVIDER_SITE_OTHER): Payer: Medicare Other | Admitting: Cardiology

## 2013-08-17 ENCOUNTER — Encounter: Payer: Self-pay | Admitting: Cardiology

## 2013-08-17 ENCOUNTER — Encounter (HOSPITAL_COMMUNITY)
Admission: RE | Admit: 2013-08-17 | Discharge: 2013-08-17 | Disposition: A | Discharge status: Home / Self Care | Payer: Medicare Other | Source: Ambulatory Visit | Attending: Cardiology | Admitting: Cardiology

## 2013-08-17 ENCOUNTER — Encounter (HOSPITAL_COMMUNITY): Payer: Medicare Other

## 2013-08-17 VITALS — BP 111/80 | HR 66 | Ht 64.5 in | Wt 138.0 lb

## 2013-08-17 DIAGNOSIS — E785 Hyperlipidemia, unspecified: Secondary | ICD-10-CM

## 2013-08-17 DIAGNOSIS — I4891 Unspecified atrial fibrillation: Secondary | ICD-10-CM | POA: Diagnosis not present

## 2013-08-17 DIAGNOSIS — I951 Orthostatic hypotension: Secondary | ICD-10-CM | POA: Diagnosis not present

## 2013-08-17 DIAGNOSIS — R9431 Abnormal electrocardiogram [ECG] [EKG]: Secondary | ICD-10-CM

## 2013-08-17 DIAGNOSIS — R001 Bradycardia, unspecified: Secondary | ICD-10-CM

## 2013-08-17 DIAGNOSIS — I5181 Takotsubo syndrome: Secondary | ICD-10-CM | POA: Diagnosis not present

## 2013-08-17 DIAGNOSIS — I498 Other specified cardiac arrhythmias: Secondary | ICD-10-CM

## 2013-08-17 DIAGNOSIS — I10 Essential (primary) hypertension: Secondary | ICD-10-CM

## 2013-08-17 NOTE — Assessment & Plan Note (Signed)
We have carefully adjusted her medications over time and her blood pressure is well controlled currently.

## 2013-08-17 NOTE — Assessment & Plan Note (Signed)
Heart rate today is 51. She's not having symptoms. Her heart rate increases appropriately with rehabilitation. No change in medicines. I would like to stop her beta blocker. However this is being used for her hyperthyroidism.

## 2013-08-17 NOTE — Assessment & Plan Note (Signed)
Currently she is not having any symptoms of orthostatic hypotension. No change in therapy.

## 2013-08-17 NOTE — Assessment & Plan Note (Signed)
Lipid levels have been controlled on a small dose of atorvastatin. She does have mild coronary disease. I am not inclined to change her statin dose at this time.

## 2013-08-17 NOTE — Assessment & Plan Note (Signed)
She had a brief episode of atrial fibrillation while hospitalized with her Takotsubo event. She was hyperthyroid at that time also. She is holding sinus rhythm. I've chosen to anticoagulate her with a low dose. I may stop this in the future. When she is completely euthyroid, I may have her wear an event recorder. If she has absolutely no atrial fibrillation I may consider stopping her anticoagulation. For now she is on both aspirin and Eliquis. Today I have instructed her to stop her aspirin. I will restart low-dose aspirin a later date if we stop the anticoagulation

## 2013-08-17 NOTE — Assessment & Plan Note (Signed)
Patient had normalization of LV function rapidly after her event. No further workup now.

## 2013-08-17 NOTE — Assessment & Plan Note (Signed)
I'm pleased that her EKG looks much better at this time.

## 2013-08-17 NOTE — Progress Notes (Signed)
HPI  Patient is seen today to followup episode of Takotsubo cardiomyopathy. I saw her post hospitalization in November and December of 2014. At the time of catheterization she had slight coronary disease. She had left ventricular dysfunction. She had rapid return of left ventricular function by echo. It was interesting that she had EKG changes that were marked and persisted. She was also found to be significantly hyperthyroid. She is receiving methimazole. She will be following up with endocrinology on January 26. Once she is far enough post cath, consideration can be given to radioactive iodine. Despite hyperthyroidism she has sinus bradycardia. I have kept her on a small dose of beta blocker because of her hyperthyroidism. She is not symptomatic with her bradycardia. At rehabilitation she can increase her heart rate appropriately. She had been losing weight from hyperthyroidism. This has now stopped and she has gained weight.  Allergies  Allergen Reactions  . Codeine Nausea And Vomiting    Current Outpatient Prescriptions  Medication Sig Dispense Refill  . amitriptyline (ELAVIL) 25 MG tablet Take 25 mg by mouth at bedtime.       Marland Kitchen amLODipine (NORVASC) 5 MG tablet Take 1 tablet (5 mg total) by mouth daily with supper.  90 tablet  3  . apixaban (ELIQUIS) 2.5 MG TABS tablet Take 1 tablet (2.5 mg total) by mouth 2 (two) times daily.  180 tablet  2  . aspirin EC 325 MG EC tablet Take 1 tablet (325 mg total) by mouth daily.      Marland Kitchen atorvastatin (LIPITOR) 10 MG tablet Take 10 mg by mouth at bedtime.      . Biotin 5000 MCG CAPS Take 1 capsule by mouth daily.       . Calcium Carbonate (CALTRATE 600 PO) Take 1 tablet by mouth daily.       . carvedilol (COREG) 12.5 MG tablet Take 0.5 tablets (6.25 mg total) by mouth 2 (two) times daily with a meal.      . Cholecalciferol (D3-1000) 1000 UNITS capsule Take 1,000 Units by mouth daily.      . clindamycin (CLEOCIN T) 1 % lotion Apply 1 application  topically daily.      . enalapril (VASOTEC) 20 MG tablet Take 1 tablet (20 mg total) by mouth 2 (two) times daily.  180 tablet  3  . EVENING PRIMROSE OIL PO Take 1 capsule by mouth daily.       . furosemide (LASIX) 20 MG tablet Take 1 tablet (20 mg total) by mouth daily with breakfast.  90 tablet  2  . KLOR-CON M20 20 MEQ tablet TAKE 2 TABLETS EVERY       MORNING, TAKE 2 TABLETS AT LUNCH AND TAKE 1 TABLET AT DINNER  320 tablet  3  . meloxicam (MOBIC) 15 MG tablet Take 15 mg by mouth daily.      . methimazole (TAPAZOLE) 5 MG tablet Take 1 tablet (5 mg total) by mouth 2 (two) times daily.  100 tablet  1  . methylcellulose packet Take 1 each by mouth daily. CITRICEL      . nitroGLYCERIN (NITROSTAT) 0.4 MG SL tablet Place 1 tablet (0.4 mg total) under the tongue every 5 (five) minutes as needed for chest pain.  25 tablet  12   No current facility-administered medications for this visit.    History   Social History  . Marital Status: Married    Spouse Name: N/A    Number of Children: 1  . Years of  Education: N/A   Occupational History  . Homemaker    Social History Main Topics  . Smoking status: Never Smoker   . Smokeless tobacco: Never Used  . Alcohol Use: No  . Drug Use: No  . Sexual Activity: No   Other Topics Concern  . Not on file   Social History Narrative   3 caffeine drinks daily    Married   Retired          Family History  Problem Relation Age of Onset  . Heart attack Mother 81  . Hypertension Mother   . Heart disease Mother   . Stroke Mother   . Heart attack Father 24  . Coronary artery disease Father     Multiple Family Members  . Hypertension Father   . Heart disease Father   . Esophageal cancer Paternal Grandmother   . Colon cancer Maternal Grandfather   . Heart disease Maternal Grandfather   . Irritable bowel syndrome Sister     More family members on father side of   . Hypertension Sister   . Heart disease Sister   . Breast cancer Paternal Aunt       Age 15's  . Heart disease Paternal Grandfather     Past Medical History  Diagnosis Date  . Dyslipidemia   . Polymyalgia rheumatica   . HTN (hypertension)   . Mitral valve prolapse     echo, January, 2011, moderate prolapse with your leaflet, trivial MR   Antibiotic required for procedures  . Tricuspid regurgitation     mild to moderate, echo, January, 2011, PA pressure 38 mm mercury  . 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 Hyponatremia   . Chronic cystitis   . Hemorrhoids   . Hx of colonoscopy   . IBS (irritable bowel syndrome)   . History of colon polyps   . Ejection fraction     EF 65%, echo, 2011  . Potassium (K) deficiency     5 potassium requirement over time  . Syncope     August, 200 weight, dehydration  . 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 CIN I (cervical intraepithelial neoplasia I)   . Osteoporosis   . PONV (postoperative nausea and vomiting)   . Heart murmur     MVP   . Orthostasis     Mild orthostatic change when she stands  . Basal cell carcinoma of nose     removed w/MOHs  . Osteoarthritis     "do not have RA" (06/17/2013)  . CAD (coronary artery disease)     a. NSTEMI 06/2013 => LHC:  mLAD 40, oD2 70 (small), pOM 20, mRCA 20, mid to dist ant HK, EF 30-35% (c/w Tako-Tsubo CM)  . 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 EF 30-35% at [facility] 06/2013;  b.  f/u Echo (06/18/13):  EF 60-65%, normal wall motion, Gr 1 DD, mild MVP of post leaflet, mod TR, PASP 63  . Atrial fibrillation   . Abnormal EKG     Past Surgical History  Procedure Laterality Date  . Excisional hemorrhoidectomy  2003  . Mohs surgery Left 2011    "side of my nose" (06/17/2013)  . Total knee arthroplasty  08/2010  . Colonoscopy  12/23/2003    normal (indications: prior adenomas and grandfather with colon cancer)  . Colposcopy    . Tonsillectomy    . Total knee arthroplasty  02/25/2012    Procedure: TOTAL KNEE  ARTHROPLASTY;  Surgeon: Gearlean Alf, MD;  Location: [facility];  Service: Orthopedics;  Laterality: Right;  . Cardiac catheterization  06/17/2013    Patient Active Problem List   Diagnosis Date Noted  . Dyslipidemia     Priority: High  . Ejection fraction     Priority: High  . Potassium (K) deficiency     Priority: High  . Anticoagulation adequate 07/20/2013  . Hyperthyroidism 06/29/2013  . 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 Abnormal EKG   . Takotsubo syndrome 06/19/2013  . Atrial fibrillation 06/19/2013  . Orthostasis   . H/O dizziness 12/26/2012  . Abnormal LFTs 12/26/2012  . NSAID long-term use 12/05/2012  . Acute epigastric pain 12/05/2012  . CIN I (cervical intraepithelial neoplasia I)   . Ringing in ears 05/09/2011  . HTN (hypertension)   . Mitral valve prolapse   . Tricuspid regurgitation   . Subdural hematoma   . Syncope   . Personal history of colonic polyps 02/06/2011  . Personal history of failed moderate sedation 02/06/2011  . RHINITIS 08/23/2010  . ARTHRITIS, KNEE 08/23/2010  . SKIN CANCER, HX OF 02/24/2010  . FX CLSD SKL VLT W/HEM NEC, CONCUSSION NOS 04/07/2007  . POLYMYALGIA RHEUMATICA 04/02/2007  . OSTEOPOROSIS 03/11/2007    ROS   Patient denies fever, chills, headache, sweats, rash, change in vision, change in hearing, chest pain, cough, nausea vomiting, urinary symptoms. All other systems are reviewed and are negative.  PHYSICAL EXAM  She looks quite good. She has gained some weight. She seems more relaxed today. She is oriented to person time and place. Affect is normal. There is no jugular venous distention. Lungs are clear. Respiratory effort is nonlabored. Cardiac exam reveals an S1-S2. There no clicks or significant murmurs. The abdomen is soft. There is no peripheral edema.  Filed Vitals:   08/17/13 1208  BP: 111/80  Pulse: 66  Height: 5' 4.5" (1.638 m)  Weight: 138 lb (62.596 kg)   EKG is done today and reviewed by me. Her marked ST-T wave  changes have definitely improved finally. She does have sinus bradycardia.  ASSESSMENT & PLAN

## 2013-08-17 NOTE — Patient Instructions (Signed)
Your physician recommends that you continue on your current medications as directed. Please refer to the Current Medication list given to you today.  Your physician recommends that you schedule a follow-up appointment in: 2 months 

## 2013-08-19 ENCOUNTER — Encounter (HOSPITAL_COMMUNITY): Payer: Medicare Other

## 2013-08-19 ENCOUNTER — Encounter (HOSPITAL_COMMUNITY)
Admission: RE | Admit: 2013-08-19 | Discharge: 2013-08-19 | Disposition: A | Discharge status: Home / Self Care | Payer: Medicare Other | Source: Ambulatory Visit | Attending: Cardiology | Admitting: Cardiology

## 2013-08-21 ENCOUNTER — Encounter (HOSPITAL_COMMUNITY)
Admission: RE | Admit: 2013-08-21 | Discharge: 2013-08-21 | Disposition: A | Discharge status: Home / Self Care | Payer: Medicare Other | Source: Ambulatory Visit | Attending: Cardiology | Admitting: Cardiology

## 2013-08-21 ENCOUNTER — Encounter (HOSPITAL_COMMUNITY): Payer: Medicare Other

## 2013-08-24 ENCOUNTER — Encounter (HOSPITAL_COMMUNITY)
Admission: RE | Admit: 2013-08-24 | Discharge: 2013-08-24 | Disposition: A | Discharge status: Home / Self Care | Payer: Medicare Other | Source: Ambulatory Visit | Attending: Cardiology | Admitting: Cardiology

## 2013-08-24 ENCOUNTER — Encounter (HOSPITAL_COMMUNITY): Payer: Medicare Other

## 2013-08-25 ENCOUNTER — Other Ambulatory Visit (INDEPENDENT_AMBULATORY_CARE_PROVIDER_SITE_OTHER): Payer: Medicare Other

## 2013-08-25 ENCOUNTER — Other Ambulatory Visit: Payer: Medicare Other

## 2013-08-25 DIAGNOSIS — E0590 Thyrotoxicosis, unspecified without thyrotoxic crisis or storm: Secondary | ICD-10-CM

## 2013-08-25 LAB — T4, FREE: Free T4: 0.48 ng/dL — ABNORMAL LOW (ref 0.60–1.60)

## 2013-08-25 LAB — T3, FREE: T3, Free: 2.1 pg/mL — ABNORMAL LOW (ref 2.3–4.2)

## 2013-08-25 LAB — TSH: TSH: 0.13 u[IU]/mL — ABNORMAL LOW (ref 0.35–5.50)

## 2013-08-26 ENCOUNTER — Encounter (HOSPITAL_COMMUNITY)
Admission: RE | Admit: 2013-08-26 | Discharge: 2013-08-26 | Disposition: A | Discharge status: Home / Self Care | Payer: Medicare Other | Source: Ambulatory Visit | Attending: Cardiology | Admitting: Cardiology

## 2013-08-26 ENCOUNTER — Encounter (HOSPITAL_COMMUNITY): Payer: Medicare Other

## 2013-08-28 ENCOUNTER — Encounter (HOSPITAL_COMMUNITY): Payer: Medicare Other

## 2013-08-28 ENCOUNTER — Encounter (HOSPITAL_COMMUNITY)
Admission: RE | Admit: 2013-08-28 | Discharge: 2013-08-28 | Disposition: A | Discharge status: Home / Self Care | Payer: Medicare Other | Source: Ambulatory Visit | Attending: Cardiology | Admitting: Cardiology

## 2013-08-31 ENCOUNTER — Encounter (HOSPITAL_COMMUNITY): Payer: Medicare Other

## 2013-08-31 ENCOUNTER — Encounter: Payer: Self-pay | Admitting: Internal Medicine

## 2013-08-31 ENCOUNTER — Encounter (HOSPITAL_COMMUNITY)
Admission: RE | Admit: 2013-08-31 | Discharge: 2013-08-31 | Disposition: A | Discharge status: Home / Self Care | Payer: Medicare Other | Source: Ambulatory Visit | Attending: Cardiology | Admitting: Cardiology

## 2013-08-31 ENCOUNTER — Ambulatory Visit (INDEPENDENT_AMBULATORY_CARE_PROVIDER_SITE_OTHER): Payer: Medicare Other | Admitting: Internal Medicine

## 2013-08-31 VITALS — BP 102/70 | HR 55 | Temp 98.2°F | Resp 12 | Wt 139.7 lb

## 2013-08-31 DIAGNOSIS — E0590 Thyrotoxicosis, unspecified without thyrotoxic crisis or storm: Secondary | ICD-10-CM

## 2013-08-31 NOTE — Progress Notes (Signed)
Patient ID: [name], female   DOB: 02/15/1941, 73 y.o.   MRN: [id number]   HPI  [name] is a 73 y.o.-year-old female, returning for f/u for thyrotoxicosis. Last visit 3 mo ago.  Reviewed hx; Patient was admitted to the hospital in the past fall with Takotsubo cardiomyopathy, and a low TSH was found incidentally. Patient had cardiac catheterization on 06/17/2013 (after blood for TSH was drawn). She was also found to have atrial fibrillation in the hospital, now resolved (was on Coreg in the past and continues now). The patient's EF also normalized before discharge.  She was scheduled to have a hospital followup appointment with her PCP. Dr. Regis Bill repeated a TSH level and added a free T4 and a free T3 and these returned abnormal, suggesting thyrotoxicosis. Of note, her pulse was in the 50s and she was not in atrial fibrillation at the time of the appointment yesterday. Patient was referred to endocrinology for for further management.  I reviewed pt's thyroid tests: Lab Results  Component Value Date   TSH 0.13* 08/25/2013   TSH 0.03* 06/26/2013   TSH <0.008* 06/17/2013   TSH 0.69 08/16/2011   TSH 2.12 10/08/2006   FREET4 0.48* 08/25/2013   FREET4 2.38* 06/26/2013   FREET4 1.0 10/08/2006  fT3 on 06/26/2013: 6.4 (2.3 - 4.2)   We started 5 mg bid of MMI at last visit, as we could not check an Uptake and scan (pt was post catheterization, >> high iodine load). She had a great response to this dose:  Component     Latest Ref Rng 06/26/2013 08/25/2013  T3, Free     2.3 - 4.2 pg/mL 6.4 (H) 2.1 (L)  Free T4     0.60 - 1.60 ng/dL 2.38 (H) 0.48 (L)  TSH     0.35 - 5.50 uIU/mL 0.03 (L) 0.13 (L)   She feels much better. Pt c/o: - + weight gain: 5 lbs - resolved excessive sweating/heat intolerance - resolved  tremors - resolved  anxiety - resolved  palpitations - resolved  hyperdefecation - resolved  weight loss (from 164 lbs in 02/2012 to 137 lbs this month) - resolved   fatigue  Pt denies feeling nodules in neck, hoarseness, dysphagia/odynophagia, SOB with lying down.  I reviewed her chart and she also had higher LFTs during last admission. Last set: Lab Results  Component Value Date   ALT 44* 06/18/2013   AST 43* 06/18/2013   ALKPHOS 62 06/18/2013   BILITOT 0.6 06/18/2013  Previous LFTs were normal.   She also has a h/o hypertension, hyperlipidemia, tricuspid regurgitation, mitral valve prolapse, PMR.  ROS: Constitutional: + weight gain, no fatigue, no subjective hyperthermia/hypothermia Eyes: no blurry vision, no xerophthalmia ENT: no sore throat, no nodules palpated in throat, no dysphagia/odynophagia, no hoarseness Cardiovascular: no CP/SOB/palpitations/leg swelling Respiratory: no cough/SOB Gastrointestinal: no N/V/D/C Musculoskeletal: no muscle/joint aches Skin: no rashes Neurological: no tremors/numbness/tingling/dizziness, + HA  I reviewed pt's medications, allergies, PMH, social hx, family hx and no changes required, except as mentioned above.  PE: BP 102/70  Pulse 55  Temp(Src) 98.2 F (36.8 C) (Oral)  Resp 12  Wt 139 lb 11.2 oz (63.368 kg)  SpO2 98% Wt Readings from Last 3 Encounters:  08/31/13 139 lb 11.2 oz (63.368 kg)  08/17/13 138 lb (62.596 kg)  07/29/13 134 lb 12.8 oz (61.145 kg)  Body mass index is 23.62 kg/(m^2).  Constitutional: normal weight, in NAD Eyes: PERRLA, EOMI, no exophthalmos, no lid lag, no stare ENT:  moist mucous membranes, slight thyromegaly, no thyroid bruits, no cervical lymphadenopathy Cardiovascular: RRR, No MRG Respiratory: CTA B Gastrointestinal: abdomen soft, NT, ND, BS+ Musculoskeletal: no deformities, strength intact in all 4 Skin: moist, warm, no rashes Neurological: no tremor with outstretched hands, could not perform DTR - patient with TKR in both knees   ASSESSMENT: 1. Thyrotoxycosis  PLAN:  1. Patient with a recently found thyrotoxicosis, initially with thyrotoxic sxs: weight  loss, heat intolerance, hyperdefecation, palpitations, anxiety, now much better on MMI, with low free t4 and free t3. - the only way to differentiate between causes of hypothyroidism is to perform an uptake and scan, however due to her recent high iodine load during her heart catheterization, we could not perform the uptake and scan yet. At this time, as her tests are much improved on MMI, I am not convinced we still need it - for now, I advised her to: Patient Instructions  Please stop the pm dose of Methimazole. Please return in 4 weeks for thyroid tests. Please come back for a follow-up appointment in 3 months.  - she is on a beta blocker >> pulse low today - continue Coreg - RTC in 3 months

## 2013-08-31 NOTE — Patient Instructions (Signed)
Please stop the pm dose of Methimazole. Please return in 4 weeks for thyroid tests. Please come back for a follow-up appointment in 3 months.

## 2013-09-02 ENCOUNTER — Encounter (HOSPITAL_COMMUNITY): Payer: Medicare Other

## 2013-09-02 ENCOUNTER — Encounter (HOSPITAL_COMMUNITY)
Admission: RE | Admit: 2013-09-02 | Discharge: 2013-09-02 | Disposition: A | Discharge status: Home / Self Care | Payer: Medicare Other | Source: Ambulatory Visit | Attending: Cardiology | Admitting: Cardiology

## 2013-09-04 ENCOUNTER — Encounter (HOSPITAL_COMMUNITY)
Admission: RE | Admit: 2013-09-04 | Discharge: 2013-09-04 | Disposition: A | Discharge status: Home / Self Care | Payer: Medicare Other | Source: Ambulatory Visit | Attending: Cardiology | Admitting: Cardiology

## 2013-09-04 ENCOUNTER — Encounter (HOSPITAL_COMMUNITY): Payer: Medicare Other

## 2013-09-07 ENCOUNTER — Encounter (HOSPITAL_COMMUNITY): Payer: Medicare Other

## 2013-09-07 ENCOUNTER — Encounter (HOSPITAL_COMMUNITY)
Admission: RE | Admit: 2013-09-07 | Discharge: 2013-09-07 | Disposition: A | Discharge status: Home / Self Care | Payer: Medicare Other | Source: Ambulatory Visit | Attending: Cardiology | Admitting: Cardiology

## 2013-09-07 DIAGNOSIS — Z5189 Encounter for other specified aftercare: Secondary | ICD-10-CM | POA: Diagnosis not present

## 2013-09-07 DIAGNOSIS — I214 Non-ST elevation (NSTEMI) myocardial infarction: Secondary | ICD-10-CM | POA: Insufficient documentation

## 2013-09-09 ENCOUNTER — Encounter (HOSPITAL_COMMUNITY): Payer: Medicare Other

## 2013-09-09 ENCOUNTER — Encounter (HOSPITAL_COMMUNITY)
Admission: RE | Admit: 2013-09-09 | Discharge: 2013-09-09 | Disposition: A | Discharge status: Home / Self Care | Payer: Medicare Other | Source: Ambulatory Visit | Attending: Cardiology | Admitting: Cardiology

## 2013-09-11 ENCOUNTER — Encounter (HOSPITAL_COMMUNITY): Payer: Medicare Other

## 2013-09-11 ENCOUNTER — Encounter (HOSPITAL_COMMUNITY)
Admission: RE | Admit: 2013-09-11 | Discharge: 2013-09-11 | Disposition: A | Discharge status: Home / Self Care | Payer: Medicare Other | Source: Ambulatory Visit | Attending: Cardiology | Admitting: Cardiology

## 2013-09-14 ENCOUNTER — Encounter (HOSPITAL_COMMUNITY): Payer: Medicare Other

## 2013-09-14 ENCOUNTER — Encounter (HOSPITAL_COMMUNITY)
Admission: RE | Admit: 2013-09-14 | Discharge: 2013-09-14 | Disposition: A | Discharge status: Home / Self Care | Payer: Medicare Other | Source: Ambulatory Visit | Attending: Cardiology | Admitting: Cardiology

## 2013-09-16 ENCOUNTER — Encounter (HOSPITAL_COMMUNITY): Payer: Medicare Other

## 2013-09-16 ENCOUNTER — Encounter (HOSPITAL_COMMUNITY)
Admission: RE | Admit: 2013-09-16 | Discharge: 2013-09-16 | Disposition: A | Discharge status: Home / Self Care | Payer: Medicare Other | Source: Ambulatory Visit | Attending: Cardiology | Admitting: Cardiology

## 2013-09-18 ENCOUNTER — Encounter (HOSPITAL_COMMUNITY)
Admission: RE | Admit: 2013-09-18 | Discharge: 2013-09-18 | Disposition: A | Discharge status: Home / Self Care | Payer: Medicare Other | Source: Ambulatory Visit | Attending: Cardiology | Admitting: Cardiology

## 2013-09-18 ENCOUNTER — Encounter (HOSPITAL_COMMUNITY): Payer: Medicare Other

## 2013-09-21 ENCOUNTER — Encounter (HOSPITAL_COMMUNITY): Payer: Medicare Other

## 2013-09-21 ENCOUNTER — Encounter (HOSPITAL_COMMUNITY)
Admission: RE | Admit: 2013-09-21 | Discharge: 2013-09-21 | Disposition: A | Discharge status: Home / Self Care | Payer: Medicare Other | Source: Ambulatory Visit | Attending: Cardiology | Admitting: Cardiology

## 2013-09-23 ENCOUNTER — Encounter (HOSPITAL_COMMUNITY)
Admission: RE | Admit: 2013-09-23 | Discharge: 2013-09-23 | Disposition: A | Discharge status: Home / Self Care | Payer: Medicare Other | Source: Ambulatory Visit | Attending: Cardiology | Admitting: Cardiology

## 2013-09-23 ENCOUNTER — Encounter (HOSPITAL_COMMUNITY): Payer: Medicare Other

## 2013-09-25 ENCOUNTER — Encounter (HOSPITAL_COMMUNITY): Payer: Medicare Other

## 2013-09-25 ENCOUNTER — Encounter (HOSPITAL_COMMUNITY)
Admission: RE | Admit: 2013-09-25 | Discharge: 2013-09-25 | Disposition: A | Discharge status: Home / Self Care | Payer: Medicare Other | Source: Ambulatory Visit | Attending: Cardiology | Admitting: Cardiology

## 2013-09-28 ENCOUNTER — Other Ambulatory Visit (INDEPENDENT_AMBULATORY_CARE_PROVIDER_SITE_OTHER): Payer: Medicare Other

## 2013-09-28 ENCOUNTER — Encounter (HOSPITAL_COMMUNITY): Payer: Medicare Other

## 2013-09-28 ENCOUNTER — Encounter (HOSPITAL_COMMUNITY)
Admission: RE | Admit: 2013-09-28 | Discharge: 2013-09-28 | Disposition: A | Discharge status: Home / Self Care | Payer: Medicare Other | Source: Ambulatory Visit | Attending: Cardiology | Admitting: Cardiology

## 2013-09-28 DIAGNOSIS — E0590 Thyrotoxicosis, unspecified without thyrotoxic crisis or storm: Secondary | ICD-10-CM | POA: Diagnosis not present

## 2013-09-28 LAB — T3, FREE: T3, Free: 3.1 pg/mL (ref 2.3–4.2)

## 2013-09-28 LAB — T4, FREE: Free T4: 1.96 ng/dL — ABNORMAL HIGH (ref 0.60–1.60)

## 2013-09-28 LAB — TSH: TSH: 0.02 u[IU]/mL — ABNORMAL LOW (ref 0.35–5.50)

## 2013-09-29 ENCOUNTER — Other Ambulatory Visit: Payer: Self-pay | Admitting: Internal Medicine

## 2013-09-29 DIAGNOSIS — E0590 Thyrotoxicosis, unspecified without thyrotoxic crisis or storm: Secondary | ICD-10-CM

## 2013-09-30 ENCOUNTER — Encounter (HOSPITAL_COMMUNITY): Payer: Medicare Other

## 2013-09-30 ENCOUNTER — Encounter (HOSPITAL_COMMUNITY)
Admission: RE | Admit: 2013-09-30 | Discharge: 2013-09-30 | Disposition: A | Discharge status: Home / Self Care | Payer: Medicare Other | Source: Ambulatory Visit | Attending: Cardiology | Admitting: Cardiology

## 2013-09-30 DIAGNOSIS — M6580 Other synovitis and tenosynovitis, unspecified site: Secondary | ICD-10-CM | POA: Diagnosis not present

## 2013-09-30 DIAGNOSIS — Z96659 Presence of unspecified artificial knee joint: Secondary | ICD-10-CM | POA: Diagnosis not present

## 2013-10-02 ENCOUNTER — Encounter (HOSPITAL_COMMUNITY)
Admission: RE | Admit: 2013-10-02 | Discharge: 2013-10-02 | Disposition: A | Discharge status: Home / Self Care | Payer: Medicare Other | Source: Ambulatory Visit | Attending: Cardiology | Admitting: Cardiology

## 2013-10-02 ENCOUNTER — Encounter (HOSPITAL_COMMUNITY): Payer: Medicare Other

## 2013-10-05 ENCOUNTER — Encounter (HOSPITAL_COMMUNITY)
Admission: RE | Admit: 2013-10-05 | Discharge: 2013-10-05 | Disposition: A | Discharge status: Home / Self Care | Payer: Medicare Other | Source: Ambulatory Visit | Attending: Cardiology | Admitting: Cardiology

## 2013-10-05 ENCOUNTER — Encounter (HOSPITAL_COMMUNITY): Payer: Medicare Other

## 2013-10-05 DIAGNOSIS — I214 Non-ST elevation (NSTEMI) myocardial infarction: Secondary | ICD-10-CM | POA: Diagnosis not present

## 2013-10-05 DIAGNOSIS — Z5189 Encounter for other specified aftercare: Secondary | ICD-10-CM | POA: Insufficient documentation

## 2013-10-05 NOTE — Progress Notes (Signed)
Bethena Roys graduates today and plans to continue exercise at the gym.

## 2013-10-07 ENCOUNTER — Encounter (HOSPITAL_COMMUNITY): Payer: Medicare Other

## 2013-10-07 DIAGNOSIS — M6580 Other synovitis and tenosynovitis, unspecified site: Secondary | ICD-10-CM | POA: Diagnosis not present

## 2013-10-09 ENCOUNTER — Encounter (HOSPITAL_COMMUNITY): Payer: Medicare Other

## 2013-10-12 ENCOUNTER — Encounter (HOSPITAL_COMMUNITY): Payer: Medicare Other

## 2013-10-14 ENCOUNTER — Encounter (HOSPITAL_COMMUNITY): Payer: Medicare Other

## 2013-10-14 DIAGNOSIS — M6580 Other synovitis and tenosynovitis, unspecified site: Secondary | ICD-10-CM | POA: Diagnosis not present

## 2013-10-16 ENCOUNTER — Encounter (HOSPITAL_COMMUNITY): Payer: Medicare Other

## 2013-10-16 DIAGNOSIS — M6580 Other synovitis and tenosynovitis, unspecified site: Secondary | ICD-10-CM | POA: Diagnosis not present

## 2013-10-19 ENCOUNTER — Encounter: Payer: Self-pay | Admitting: Cardiology

## 2013-10-19 ENCOUNTER — Encounter (HOSPITAL_COMMUNITY): Payer: Medicare Other

## 2013-10-19 ENCOUNTER — Ambulatory Visit (INDEPENDENT_AMBULATORY_CARE_PROVIDER_SITE_OTHER): Payer: Medicare Other | Admitting: Cardiology

## 2013-10-19 VITALS — BP 142/82 | HR 80 | Ht 64.5 in | Wt 141.0 lb

## 2013-10-19 DIAGNOSIS — I10 Essential (primary) hypertension: Secondary | ICD-10-CM

## 2013-10-19 DIAGNOSIS — E785 Hyperlipidemia, unspecified: Secondary | ICD-10-CM | POA: Diagnosis not present

## 2013-10-19 DIAGNOSIS — E876 Hypokalemia: Secondary | ICD-10-CM

## 2013-10-19 DIAGNOSIS — R001 Bradycardia, unspecified: Secondary | ICD-10-CM

## 2013-10-19 DIAGNOSIS — I5181 Takotsubo syndrome: Secondary | ICD-10-CM

## 2013-10-19 DIAGNOSIS — E0590 Thyrotoxicosis, unspecified without thyrotoxic crisis or storm: Secondary | ICD-10-CM

## 2013-10-19 DIAGNOSIS — Z7901 Long term (current) use of anticoagulants: Secondary | ICD-10-CM

## 2013-10-19 DIAGNOSIS — I951 Orthostatic hypotension: Secondary | ICD-10-CM

## 2013-10-19 DIAGNOSIS — I498 Other specified cardiac arrhythmias: Secondary | ICD-10-CM

## 2013-10-19 LAB — BASIC METABOLIC PANEL
BUN: 20 mg/dL (ref 6–23)
CO2: 25 mEq/L (ref 19–32)
Calcium: 9.4 mg/dL (ref 8.4–10.5)
Chloride: 106 mEq/L (ref 96–112)
Creatinine, Ser: 0.9 mg/dL (ref 0.4–1.2)
GFR: 65 mL/min (ref >60.00)
Glucose, Bld: 89 mg/dL (ref 70–99)
Potassium: 4.4 mEq/L (ref 3.5–5.1)
Sodium: 139 mEq/L (ref 135–145)

## 2013-10-19 NOTE — Assessment & Plan Note (Signed)
For now anticoagulation will continue until I'm sure she is euthyroid.

## 2013-10-19 NOTE — Assessment & Plan Note (Signed)
Her thyroid is being treated very carefully by endocrinology. Her methimazole dose had been lowered and then re\re increased.

## 2013-10-19 NOTE — Assessment & Plan Note (Signed)
She's not having any significant orthostatic symptoms. No change in therapy.

## 2013-10-19 NOTE — Progress Notes (Signed)
HPI  Patient is seen today for followup her episode of Takotsubo cardiomyopathy. I saw her last January, 2015. Her marked EKG changes had improved. Her thyroid is being followed very carefully by endocrinology. There was thinking at one point did her methimazole might be able to be titrated down without need for radioactive iodine. However as the methimazole those came down, her thyroid functions became elevated again. Her methimazole dose was adjusted back up and she is being followed carefully by endocrinology. I continue her beta blocker on this basis even though her heart rate is borderline low. Am also continuing her anticoagulation at this time until she is fully euthyroid. She is fully active and not having problems.  Allergies  Allergen Reactions  . Codeine Nausea And Vomiting    Current Outpatient Prescriptions  Medication Sig Dispense Refill  . amitriptyline (ELAVIL) 25 MG tablet Take 25 mg by mouth at bedtime.       Marland Kitchen amLODipine (NORVASC) 5 MG tablet Take 1 tablet (5 mg total) by mouth daily with supper.  90 tablet  3  . apixaban (ELIQUIS) 2.5 MG TABS tablet Take 1 tablet (2.5 mg total) by mouth 2 (two) times daily.  180 tablet  2  . atorvastatin (LIPITOR) 10 MG tablet Take 10 mg by mouth at bedtime.      . Biotin 5000 MCG CAPS Take 1 capsule by mouth daily.       . Calcium Carbonate (CALTRATE 600 PO) Take 1 tablet by mouth daily.       . carvedilol (COREG) 12.5 MG tablet Take 0.5 tablets (6.25 mg total) by mouth 2 (two) times daily with a meal.      . Cholecalciferol (D3-1000) 1000 UNITS capsule Take 1,000 Units by mouth daily.      . clindamycin (CLEOCIN T) 1 % lotion Apply 1 application topically daily.      Marland Kitchen EVENING PRIMROSE OIL PO Take 1 capsule by mouth daily.       . furosemide (LASIX) 20 MG tablet Take 1 tablet (20 mg total) by mouth daily with breakfast.  90 tablet  2  . KLOR-CON M20 20 MEQ tablet TAKE 2 TABLETS EVERY       MORNING, TAKE 2 TABLETS AT LUNCH AND TAKE 1  TABLET AT DINNER  320 tablet  3  . meloxicam (MOBIC) 15 MG tablet Take 15 mg by mouth daily.      . methimazole (TAPAZOLE) 5 MG tablet Take 1 tablet (5 mg total) by mouth 2 (two) times daily.  100 tablet  1  . methylcellulose packet Take 1 each by mouth daily. CITRICEL      . nitroGLYCERIN (NITROSTAT) 0.4 MG SL tablet Place 1 tablet (0.4 mg total) under the tongue every 5 (five) minutes as needed for chest pain.  25 tablet  12  . enalapril (VASOTEC) 20 MG tablet Take 1 tablet (20 mg total) by mouth 2 (two) times daily.  180 tablet  3   No current facility-administered medications for this visit.    History   Social History  . Marital Status: Married    Spouse Name: N/A    Number of Children: 1  . Years of Education: N/A   Occupational History  . Homemaker    Social History Main Topics  . Smoking status: Never Smoker   . Smokeless tobacco: Never Used  . Alcohol Use: No  . Drug Use: No  . Sexual Activity: No   Other Topics Concern  .  Not on file   Social History Narrative   3 caffeine drinks daily    Married   Retired          Family History  Problem Relation Age of Onset  . Heart attack Mother 43  . Hypertension Mother   . Heart disease Mother   . Stroke Mother   . Heart attack Father 57  . Coronary artery disease Father     Multiple Family Members  . Hypertension Father   . Heart disease Father   . Esophageal cancer Paternal Grandmother   . Colon cancer Maternal Grandfather   . Heart disease Maternal Grandfather   . Irritable bowel syndrome Sister     More family members on father side of   . Hypertension Sister   . Heart disease Sister   . Breast cancer Paternal Aunt     Age 74's  . Heart disease Paternal Grandfather     Past Medical History  Diagnosis Date  . Dyslipidemia   . Polymyalgia rheumatica   . HTN (hypertension)   . Mitral valve prolapse     echo, January, 2011, moderate prolapse with your leaflet, trivial MR   Antibiotic required for  procedures  . Tricuspid regurgitation     mild to moderate, echo, January, 2011, PA pressure 38 mm mercury  . 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 Hyponatremia   . Chronic cystitis   . Hemorrhoids   . Hx of colonoscopy   . IBS (irritable bowel syndrome)   . History of colon polyps   . Ejection fraction     EF 65%, echo, 2011  . Potassium (K) deficiency     5 potassium requirement over time  . Syncope     August, 200 weight, dehydration  . 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 CIN I (cervical intraepithelial neoplasia I)   . Osteoporosis   . PONV (postoperative nausea and vomiting)   . Heart murmur     MVP   . Orthostasis     Mild orthostatic change when she stands  . Basal cell carcinoma of nose     removed w/MOHs  . Osteoarthritis     "do not have RA" (06/17/2013)  . CAD (coronary artery disease)     a. NSTEMI 06/2013 => LHC:  mLAD 40, oD2 70 (small), pOM 20, mRCA 20, mid to dist ant HK, EF 30-35% (c/w Tako-Tsubo CM)  . 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 EF 30-35% at [facility] 06/2013;  b.  f/u Echo (06/18/13):  EF 60-65%, normal wall motion, Gr 1 DD, mild MVP of post leaflet, mod TR, PASP 63  . Atrial fibrillation   . Abnormal EKG     Past Surgical History  Procedure Laterality Date  . Excisional hemorrhoidectomy  2003  . Mohs surgery Left 2011    "side of my nose" (06/17/2013)  . Total knee arthroplasty  08/2010  . Colonoscopy  12/23/2003    normal (indications: prior adenomas and grandfather with colon cancer)  . Colposcopy    . Tonsillectomy    . Total knee arthroplasty  02/25/2012    Procedure: TOTAL KNEE ARTHROPLASTY;  Surgeon: Gearlean Alf, MD;  Location: [facility];  Service: Orthopedics;  Laterality: Right;  . Cardiac catheterization  06/17/2013    Patient Active Problem List   Diagnosis Date Noted  . Dyslipidemia     Priority: High  .  Ejection fraction     Priority: High  .  Potassium (K) deficiency     Priority: High  . Sinus bradycardia 08/17/2013  . Anticoagulation adequate 07/20/2013  . 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 Abnormal EKG   . Takotsubo syndrome 06/19/2013  . Atrial fibrillation 06/19/2013  . Orthostasis   . H/O dizziness 12/26/2012  . Abnormal LFTs 12/26/2012  . NSAID long-term use 12/05/2012  . Acute epigastric pain 12/05/2012  . CIN I (cervical intraepithelial neoplasia I)   . Ringing in ears 05/09/2011  . HTN (hypertension)   . Mitral valve prolapse   . Tricuspid regurgitation   . Subdural hematoma   . Syncope   . Personal history of colonic polyps 02/06/2011  . Personal history of failed moderate sedation 02/06/2011  . RHINITIS 08/23/2010  . ARTHRITIS, KNEE 08/23/2010  . SKIN CANCER, HX OF 02/24/2010  . FX CLSD SKL VLT W/HEM NEC, CONCUSSION NOS 04/07/2007  . POLYMYALGIA RHEUMATICA 04/02/2007  . OSTEOPOROSIS 03/11/2007    ROS   Patient denies fever, chills, headache, sweats, rash, change in vision, change in hearing, chest pain, cough, nausea vomiting, urinary symptoms. All other systems are reviewed and are negative.  PHYSICAL EXAM  Patient is oriented to person time and place. Affect is normal. There is no jugulovenous distention. Lungs are clear. Respiratory effort is nonlabored. Cardiac exam reveals S1 and S2. There no clicks or significant murmurs. The abdomen is soft. There is no peripheral edema.  Filed Vitals:   10/19/13 1158  BP: 142/82  Pulse: 80  Height: 5' 4.5" (1.638 m)  Weight: 141 lb (63.957 kg)     ASSESSMENT & PLAN

## 2013-10-19 NOTE — Assessment & Plan Note (Signed)
Fortunately she continues to recover quite well from this.

## 2013-10-19 NOTE — Assessment & Plan Note (Signed)
Her rhythm is stable on the current dose of beta blocker that I'm using because of her hyperthyroidism.

## 2013-10-19 NOTE — Assessment & Plan Note (Signed)
Historically she has had variation in her potassium. We've not checked her basic chemistries in 6 months. Chemistry level be checked today.

## 2013-10-19 NOTE — Assessment & Plan Note (Signed)
Blood pressure is stable. No change in therapy. 

## 2013-10-19 NOTE — Assessment & Plan Note (Signed)
Her cholesterol has actually gone down while she was hyperthyroid.

## 2013-10-19 NOTE — Patient Instructions (Signed)
**Note De-Identified  Obfuscation** Your physician recommends that you continue on your current medications as directed. Please refer to the Current Medication list given to you today.  Your physician recommends that you return for lab work in: today  Your physician recommends that you schedule a follow-up appointment in: 3 months

## 2013-10-20 ENCOUNTER — Telehealth: Payer: Self-pay | Admitting: Internal Medicine
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
## 2013-10-21 DIAGNOSIS — M6580 Other synovitis and tenosynovitis, unspecified site: Secondary | ICD-10-CM | POA: Diagnosis not present

## 2013-10-23 DIAGNOSIS — M6580 Other synovitis and tenosynovitis, unspecified site: Secondary | ICD-10-CM | POA: Diagnosis not present

## 2013-10-24 ENCOUNTER — Other Ambulatory Visit: Payer: Self-pay | Admitting: Internal Medicine

## 2013-10-26 ENCOUNTER — Other Ambulatory Visit (INDEPENDENT_AMBULATORY_CARE_PROVIDER_SITE_OTHER): Payer: Medicare Other

## 2013-10-26 DIAGNOSIS — E0590 Thyrotoxicosis, unspecified without thyrotoxic crisis or storm: Secondary | ICD-10-CM | POA: Diagnosis not present

## 2013-10-26 LAB — T3, FREE: T3, Free: 3 pg/mL (ref 2.3–4.2)

## 2013-10-26 LAB — TSH: TSH: 0 u[IU]/mL — ABNORMAL LOW (ref 0.35–5.50)

## 2013-10-26 LAB — T4, FREE: Free T4: 1.64 ng/dL — ABNORMAL HIGH (ref 0.60–1.60)

## 2013-10-28 ENCOUNTER — Ambulatory Visit: Payer: Medicare Other | Admitting: Cardiology

## 2013-10-28 DIAGNOSIS — IMO0002 Reserved for concepts with insufficient information to code with codable children: Secondary | ICD-10-CM | POA: Diagnosis not present

## 2013-11-02 DIAGNOSIS — M6580 Other synovitis and tenosynovitis, unspecified site: Secondary | ICD-10-CM | POA: Diagnosis not present

## 2013-11-04 DIAGNOSIS — M6580 Other synovitis and tenosynovitis, unspecified site: Secondary | ICD-10-CM | POA: Diagnosis not present

## 2013-11-10 DIAGNOSIS — M6580 Other synovitis and tenosynovitis, unspecified site: Secondary | ICD-10-CM | POA: Diagnosis not present

## 2013-11-11 DIAGNOSIS — Z85828 Personal history of other malignant neoplasm of skin: Secondary | ICD-10-CM | POA: Diagnosis not present

## 2013-11-11 DIAGNOSIS — L739 Follicular disorder, unspecified: Secondary | ICD-10-CM | POA: Diagnosis not present

## 2013-11-11 DIAGNOSIS — L719 Rosacea, unspecified: Secondary | ICD-10-CM | POA: Diagnosis not present

## 2013-11-11 DIAGNOSIS — L723 Sebaceous cyst: Secondary | ICD-10-CM | POA: Diagnosis not present

## 2013-11-12 DIAGNOSIS — M6580 Other synovitis and tenosynovitis, unspecified site: Secondary | ICD-10-CM | POA: Diagnosis not present

## 2013-11-16 ENCOUNTER — Other Ambulatory Visit: Payer: Self-pay | Admitting: *Deleted

## 2013-11-16 ENCOUNTER — Telehealth: Payer: Self-pay | Admitting: Internal Medicine

## 2013-11-16 MED ORDER — METHIMAZOLE 5 MG PO TABS: 5.0000 mg | ORAL_TABLET | Freq: Every day | ORAL | Status: DC

## 2013-11-16 MED ORDER — METHIMAZOLE 5 MG PO TABS: 7.5000 mg | ORAL_TABLET | Freq: Every day | ORAL | Status: DC

## 2013-11-16 NOTE — Telephone Encounter (Signed)
[name], Please tell her to hold the pm dose of MMI. I do not normally recommend this type of change this fast, but let's try it. I will check the tests when she comes back in 2 weeks.

## 2013-11-16 NOTE — Telephone Encounter (Signed)
Called pt and advised her per Dr Arman Filter note below. Also, pt let me know that she was taking 2 tabs 2 x daily. Changed the rx and resent into pharmacy. Pt understood.

## 2013-11-16 NOTE — Telephone Encounter (Signed)
I will sent an updated rx of pt's methimazole to her pharmacy. Please read note below and advise.

## 2013-11-16 NOTE — Telephone Encounter (Signed)
Pt needs [place] to submit the updated rx for with the most recent dosage of methimazole so she can get the rest of her medicine. Patient wanted to let us know she is very tired what can she do about that?

## 2013-11-17 DIAGNOSIS — M6580 Other synovitis and tenosynovitis, unspecified site: Secondary | ICD-10-CM | POA: Diagnosis not present

## 2013-11-19 DIAGNOSIS — M6580 Other synovitis and tenosynovitis, unspecified site: Secondary | ICD-10-CM | POA: Diagnosis not present

## 2013-11-24 DIAGNOSIS — IMO0002 Reserved for concepts with insufficient information to code with codable children: Secondary | ICD-10-CM | POA: Diagnosis not present

## 2013-11-24 DIAGNOSIS — M1710 Unilateral primary osteoarthritis, unspecified knee: Secondary | ICD-10-CM | POA: Diagnosis not present

## 2013-11-26 ENCOUNTER — Other Ambulatory Visit: Payer: Self-pay | Admitting: Cardiology

## 2013-12-01 ENCOUNTER — Ambulatory Visit (INDEPENDENT_AMBULATORY_CARE_PROVIDER_SITE_OTHER): Payer: Medicare Other | Admitting: Internal Medicine

## 2013-12-01 ENCOUNTER — Encounter: Payer: Self-pay | Admitting: Internal Medicine

## 2013-12-01 VITALS — BP 120/84 | HR 62 | Temp 97.3°F | Resp 12 | Wt 143.2 lb

## 2013-12-01 DIAGNOSIS — E0590 Thyrotoxicosis, unspecified without thyrotoxic crisis or storm: Secondary | ICD-10-CM

## 2013-12-01 LAB — T4, FREE: Free T4: 1.44 ng/dL (ref 0.60–1.60)

## 2013-12-01 LAB — TSH: TSH: 0.01 u[IU]/mL — ABNORMAL LOW (ref 0.35–5.50)

## 2013-12-01 LAB — T3, FREE: T3, Free: 3.2 pg/mL (ref 2.3–4.2)

## 2013-12-01 NOTE — Progress Notes (Signed)
Patient ID: [name], female   DOB: 1941-02-12, 73 y.o.   MRN: [id number]   HPI  [name] is a 73 y.o.-year-old female, returning for f/u for thyrotoxicosis. Last visit 3 mo ago.  Reviewed hx: Patient was admitted to the hospital 06/2013 with Takotsubo cardiomyopathy, and a low TSH was found incidentally. She had cardiac catheterization on 06/17/2013 (after blood for TSH was drawn). She was also found to have atrial fibrillation in the hospital, now resolved (was on Coreg in the past and continues now). The patient's EF also normalized before discharge.  She was scheduled to have a hospital followup appointment with her PCP. Dr. Regis Bill repeated a TSH level and added a free T4 and a free T3 and these returned abnormal, suggesting thyrotoxicosis. Of note, her pulse was in the 50s and she was not in atrial fibrillation at the time of the appointment. Patient was referred to endocrinology for for further management.  I reviewed pt's thyroid tests: Lab Results  Component Value Date   TSH 0.00* 10/26/2013   TSH 0.02* 09/28/2013   TSH 0.13* 08/25/2013   TSH 0.03* 06/26/2013   TSH <0.008* 06/17/2013   TSH 0.69 08/16/2011   TSH 2.12 10/08/2006   FREET4 1.64* 10/26/2013   FREET4 1.96* 09/28/2013   FREET4 0.48* 08/25/2013   FREET4 2.38* 06/26/2013   FREET4 1.0 10/08/2006  fT3 on 06/26/2013: 6.4 (2.3 - 4.2)   We started 5 mg bid of MMI as we could not check an Uptake and scan (pt was post catheterization, >> high iodine load). She had a great response to this dose:   Component     Latest Ref Rng 06/26/2013 08/25/2013  T3, Free     2.3 - 4.2 pg/mL 6.4 (H) 2.1 (L)  Free T4     0.60 - 1.60 ng/dL 2.38 (H) 0.48 (L)  TSH     0.35 - 5.50 uIU/mL 0.03 (L) 0.13 (L)   We decreased the MMI dose from  5 mg in am and 2.5 mg in pm:  Component     Latest Ref Rng 09/28/2013  TSH     0.35 - 5.50 uIU/mL 0.02 (L)  Free T4     0.60 - 1.60 ng/dL 1.96 (H)  T3, Free     2.3 - 4.2 pg/mL 3.1   We  then had to increase the dose back to 5 mg bid:  Component     Latest Ref Rng 10/26/2013  T3, Free     2.3 - 4.2 pg/mL 3.0  Free T4     0.60 - 1.60 ng/dL 1.64 (H)  TSH     0.35 - 5.50 uIU/mL 0.00 (L)   Since she started to feel fatigued, we stopped the pm dose 1 mo ago.   She feels much better. Pt c/o: - + weight gain: 4 lbs - resolved excessive sweating/heat intolerance - resolved  tremors - resolved  anxiety - resolved  palpitations - resolved  hyperdefecation - resolved  weight loss (from 164 lbs in 02/2012 to 137 lbs this month) - resolved  fatigue  Pt denies feeling nodules in neck, hoarseness, dysphagia/odynophagia, SOB with lying down.  She also has a h/o hypertension, hyperlipidemia, tricuspid regurgitation, mitral valve prolapse, PMR.  ROS: Constitutional: + weight gain, no fatigue, no subjective hyperthermia/hypothermia Eyes: no blurry vision, no xerophthalmia ENT: no sore throat, no nodules palpated in throat, no dysphagia/odynophagia, no hoarseness Cardiovascular: no CP/SOB/palpitations/leg swelling Respiratory: no cough/SOB Gastrointestinal: no N/V/D/C Musculoskeletal: no muscle/joint  aches Skin: no rashes Neurological: no tremors/numbness/tingling/dizziness  I reviewed pt's medications, allergies, PMH, social hx, family hx and no changes required, except as mentioned above.  PE: BP 120/84  Pulse 62  Temp(Src) 97.3 F (36.3 C) (Oral)  Resp 12  Wt 143 lb 3.2 oz (64.955 kg)  SpO2 98% Wt Readings from Last 3 Encounters:  12/01/13 143 lb 3.2 oz (64.955 kg)  10/19/13 141 lb (63.957 kg)  08/31/13 139 lb 11.2 oz (63.368 kg)  There is no weight on file to calculate BMI.  Constitutional: normal weight, in NAD Eyes: PERRLA, EOMI, no exophthalmos, no lid lag, no stare ENT: moist mucous membranes, slight thyromegaly, no thyroid bruits, no cervical lymphadenopathy Cardiovascular: RRR, No MRG Respiratory: CTA B Gastrointestinal: abdomen soft, NT, ND,  BS+ Musculoskeletal: no deformities, strength intact in all 4 Skin: moist, warm, no rashes Neurological: no tremor with outstretched hands, could not perform DTR - patient with TKR in both knees   ASSESSMENT: 1. Thyrotoxycosis  PLAN:  1. Patient with recently found thyrotoxicosis, initially with thyrotoxic sxs: weight loss, heat intolerance, hyperdefecation, palpitations, anxiety, which improved on MMI - the only way to differentiate between causes of hypothyroidism is to perform an uptake and scan, however due to her high iodine load during her heart catheterization before last visit, we could not perform the uptake and scan yet. We can order it now.  - I will recheck her TFTs first, now, 1 mo after decreasing the MMI from 7.5 mg to 5 mg daily - I advised her to: Patient Instructions  Please stop at the lab. Please come back for a follow-up appointment in 3 months. Stop the Methimazole 4 days before the scan. - she is on a beta blocker >> continue Coreg - RTC in 3 months   Office Visit on 12/01/2013  Component Date Value Ref Range Status  . TSH 12/01/2013 0.01* 0.35 - 5.50 uIU/mL Final  . Free T4 12/01/2013 1.44  0.60 - 1.60 ng/dL Final  . T3, Free 12/01/2013 3.2  2.3 - 4.2 pg/mL Final   Free T4 and Free T3 normalized, continue current regimen: 5 mg MMI in am >> will check thyroid uptake and scan.  12/23/2013 CLINICAL DATA: Thyrotoxicosis  EXAM: THYROID SCAN AND UPTAKE - 24 HOURS  TECHNIQUE: Following the per oral administration of I-131 sodium iodide, the patient returned at 24 hours and uptake measurements were acquired with the uptake probe centered on the neck. Thyroid imaging was performed following the intravenous administration of the Tc-72m Pertechnetate.  RADIOPHARMACEUTICALS: 13.6 microCuries I-131 Sodium Iodide and 10.1 mCi TC-44m Pertechnetate  COMPARISON: None  FINDINGS: 24 hr radio iodine uptake calculated at 82%, well above normal range consistent with  hyperthyroidism.  Images of the thyroid gland in 3 projections demonstrate diffuse homogeneous tracer distribution throughout both lobes, with RIGHT lobe asymmetrically larger than LEFT.  No focal areas of increased or decreased tracer localization seen.  IMPRESSION: Markedly elevated 24 hr radio iodine uptake of 82%.  Normal thyroid scan.  Findings consistent with Graves disease.   Electronically Signed By: Lavonia Dana M.D. On: 12/23/2013 10:50  Will suggest RAI tx. D/w pt and agrees. I will see her back 1-1.5 mo after the tx.

## 2013-12-01 NOTE — Patient Instructions (Addendum)
Please stop at the lab.  Please come back for a follow-up appointment in 3 months.  Stop the Methimazole 4 days before the scan.

## 2013-12-22 ENCOUNTER — Encounter (HOSPITAL_COMMUNITY)
Admission: RE | Admit: 2013-12-22 | Discharge: 2013-12-22 | Disposition: A | Discharge status: Home / Self Care | Payer: Medicare Other | Source: Ambulatory Visit | Attending: Internal Medicine | Admitting: Internal Medicine

## 2013-12-22 DIAGNOSIS — E0590 Thyrotoxicosis, unspecified without thyrotoxic crisis or storm: Secondary | ICD-10-CM | POA: Insufficient documentation

## 2013-12-23 ENCOUNTER — Encounter (HOSPITAL_COMMUNITY)
Admission: RE | Admit: 2013-12-23 | Discharge: 2013-12-23 | Disposition: A | Discharge status: Home / Self Care | Payer: Medicare Other | Source: Ambulatory Visit | Attending: Internal Medicine | Admitting: Internal Medicine

## 2013-12-23 ENCOUNTER — Encounter: Payer: Self-pay | Admitting: Internal Medicine

## 2013-12-23 DIAGNOSIS — E0500 Thyrotoxicosis with diffuse goiter without thyrotoxic crisis or storm: Secondary | ICD-10-CM | POA: Diagnosis not present

## 2013-12-23 DIAGNOSIS — E0590 Thyrotoxicosis, unspecified without thyrotoxic crisis or storm: Secondary | ICD-10-CM | POA: Diagnosis not present

## 2013-12-23 MED ORDER — SODIUM PERTECHNETATE TC 99M INJECTION
10.1000 | Freq: Once | INTRAVENOUS | Status: AC | PRN
  Administered 2013-12-23: 10.1 via INTRAVENOUS

## 2013-12-23 MED ORDER — SODIUM IODIDE I 131 CAPSULE
13.6000 | Freq: Once | INTRAVENOUS | Status: AC | PRN
  Administered 2013-12-23: 13.6 via ORAL

## 2013-12-28 ENCOUNTER — Other Ambulatory Visit: Payer: Self-pay | Admitting: Cardiology

## 2013-12-30 DIAGNOSIS — I781 Nevus, non-neoplastic: Secondary | ICD-10-CM | POA: Diagnosis not present

## 2013-12-30 DIAGNOSIS — L821 Other seborrheic keratosis: Secondary | ICD-10-CM | POA: Diagnosis not present

## 2013-12-30 DIAGNOSIS — D239 Other benign neoplasm of skin, unspecified: Secondary | ICD-10-CM | POA: Diagnosis not present

## 2013-12-30 DIAGNOSIS — Z85828 Personal history of other malignant neoplasm of skin: Secondary | ICD-10-CM | POA: Diagnosis not present

## 2013-12-30 DIAGNOSIS — L739 Follicular disorder, unspecified: Secondary | ICD-10-CM | POA: Diagnosis not present

## 2013-12-30 DIAGNOSIS — D2360 Other benign neoplasm of skin of unspecified upper limb, including shoulder: Secondary | ICD-10-CM | POA: Diagnosis not present

## 2013-12-30 DIAGNOSIS — D1801 Hemangioma of skin and subcutaneous tissue: Secondary | ICD-10-CM | POA: Diagnosis not present

## 2013-12-30 DIAGNOSIS — D2370 Other benign neoplasm of skin of unspecified lower limb, including hip: Secondary | ICD-10-CM | POA: Diagnosis not present

## 2014-01-01 ENCOUNTER — Telehealth: Payer: Self-pay | Admitting: Internal Medicine

## 2014-01-01 NOTE — Telephone Encounter (Signed)
Patient states she has not received a call from radiology yet   Please advise patient   Thank You :)

## 2014-01-05 NOTE — Telephone Encounter (Signed)
I ordered RAI tx or her >> let's schedule this.

## 2014-01-05 NOTE — Telephone Encounter (Signed)
Please read note below and advise. I am unsure what pt is talking about. Is she waiting for results or is she waiting for an uptake and scan?

## 2014-01-06 ENCOUNTER — Other Ambulatory Visit: Payer: Self-pay | Admitting: Internal Medicine

## 2014-01-06 ENCOUNTER — Telehealth: Payer: Self-pay | Admitting: *Deleted

## 2014-01-06 NOTE — Telephone Encounter (Signed)
Waiting for Radiology to contact her to do the radio active iodine been about 2 wks and haven't heard anything can someone let her know what to do

## 2014-01-07 ENCOUNTER — Telehealth: Payer: Self-pay | Admitting: *Deleted

## 2014-01-07 NOTE — Telephone Encounter (Signed)
Called pt yesterday (01/06/14) and had to lvm with schedule of her RAI treatment. Advised her to stop taking her methimazole on 6/4 and begin a low iodine diet.  Advised her that I would be mailing her all the information she needed. Be advised.

## 2014-01-07 NOTE — Telephone Encounter (Signed)
They will give her all these instructions at the time of the appt. She can definitely be in the same house with her husband, but not in the same bed for the 3 following nights.

## 2014-01-07 NOTE — Telephone Encounter (Signed)
No low iodine needed for just RAI tx for hyperthyroidism.

## 2014-01-07 NOTE — Telephone Encounter (Signed)
Called pt and lvm advising her per Dr Gherghe's note.  

## 2014-01-07 NOTE — Telephone Encounter (Signed)
Called pt and advised her of when to stop her Methimazole. Pt concerned about being separate for 3 days after the RAI treatment. Pt wants to know if she can be in the same house as her husband, can she cook dinner, sleeping arrangements, etc. Please advise.

## 2014-01-11 DIAGNOSIS — M545 Low back pain, unspecified: Secondary | ICD-10-CM | POA: Diagnosis not present

## 2014-01-11 DIAGNOSIS — E0590 Thyrotoxicosis, unspecified without thyrotoxic crisis or storm: Secondary | ICD-10-CM | POA: Diagnosis not present

## 2014-01-11 DIAGNOSIS — M899 Disorder of bone, unspecified: Secondary | ICD-10-CM | POA: Diagnosis not present

## 2014-01-11 DIAGNOSIS — E559 Vitamin D deficiency, unspecified: Secondary | ICD-10-CM | POA: Diagnosis not present

## 2014-01-12 ENCOUNTER — Telehealth: Payer: Self-pay | Admitting: *Deleted

## 2014-01-12 DIAGNOSIS — M810 Age-related osteoporosis without current pathological fracture: Secondary | ICD-10-CM

## 2014-01-12 NOTE — Telephone Encounter (Addendum)
Pt informed okay to have urine done here, telopeptide should be urine as well.

## 2014-01-12 NOTE — Telephone Encounter (Signed)
Yes she can have a 24-hour urine for calcium excretion and the N telopeptide level

## 2014-01-12 NOTE — Telephone Encounter (Signed)
Pt would like results sent to Dr. Marcelino Scot office once back.

## 2014-01-12 NOTE — Telephone Encounter (Signed)
Pt saw Dr.Andrew laster in [place] for Osteoporosis and he would like pt to have 24 hour urine and a N-Telpeptide level. She asked if this could be done here? Please advise

## 2014-01-15 ENCOUNTER — Ambulatory Visit (INDEPENDENT_AMBULATORY_CARE_PROVIDER_SITE_OTHER): Payer: Medicare Other | Admitting: Cardiology

## 2014-01-15 ENCOUNTER — Encounter: Payer: Self-pay | Admitting: Cardiology

## 2014-01-15 ENCOUNTER — Other Ambulatory Visit: Payer: Self-pay

## 2014-01-15 VITALS — BP 115/64 | HR 107 | Ht 64.5 in | Wt 139.8 lb

## 2014-01-15 DIAGNOSIS — R7989 Other specified abnormal findings of blood chemistry: Secondary | ICD-10-CM

## 2014-01-15 DIAGNOSIS — Z7901 Long term (current) use of anticoagulants: Secondary | ICD-10-CM

## 2014-01-15 DIAGNOSIS — I498 Other specified cardiac arrhythmias: Secondary | ICD-10-CM

## 2014-01-15 DIAGNOSIS — I951 Orthostatic hypotension: Secondary | ICD-10-CM

## 2014-01-15 DIAGNOSIS — I5181 Takotsubo syndrome: Secondary | ICD-10-CM

## 2014-01-15 DIAGNOSIS — R9430 Abnormal result of cardiovascular function study, unspecified: Secondary | ICD-10-CM

## 2014-01-15 DIAGNOSIS — R945 Abnormal results of liver function studies: Secondary | ICD-10-CM

## 2014-01-15 DIAGNOSIS — E876 Hypokalemia: Secondary | ICD-10-CM

## 2014-01-15 DIAGNOSIS — R001 Bradycardia, unspecified: Secondary | ICD-10-CM

## 2014-01-15 DIAGNOSIS — E785 Hyperlipidemia, unspecified: Secondary | ICD-10-CM | POA: Diagnosis not present

## 2014-01-15 DIAGNOSIS — I10 Essential (primary) hypertension: Secondary | ICD-10-CM

## 2014-01-15 DIAGNOSIS — E0500 Thyrotoxicosis with diffuse goiter without thyrotoxic crisis or storm: Secondary | ICD-10-CM

## 2014-01-15 DIAGNOSIS — R0989 Other specified symptoms and signs involving the circulatory and respiratory systems: Secondary | ICD-10-CM

## 2014-01-15 MED ORDER — POTASSIUM CHLORIDE CRYS ER 20 MEQ PO TBCR: EXTENDED_RELEASE_TABLET | ORAL | Status: DC

## 2014-01-15 NOTE — Assessment & Plan Note (Signed)
She's had some minor elevation in her liver function studies in the past. These will be repeated we'll recheck her lipid functions.

## 2014-01-15 NOTE — Assessment & Plan Note (Signed)
Her ejection fraction normalized rapidly after her Takotsubo event. Her EKG was quite abnormal and took a long time to normalize. EKG today is completely normal.

## 2014-01-15 NOTE — Assessment & Plan Note (Signed)
I plan to continue her anticoagulation for now. It will be stopped eventually unless we see further atrial fibrillation.

## 2014-01-15 NOTE — Patient Instructions (Signed)
**Note De-Identified  Obfuscation** Your physician recommends that you continue on your current medications as directed. Please refer to the Current Medication list given to you today.  Your physician recommends that you return for lab work in: next week.Remember not to eat or drink after midnight the night before labs are draw.  Your physician wants you to follow-up in: 6 months. You will receive a reminder letter in the mail two months in advance. If you don't receive a letter, please call our office to schedule the follow-up appointment.

## 2014-01-15 NOTE — Assessment & Plan Note (Signed)
He was very interesting that she had significant sinus bradycardia when she was hyper thyroid. This is now stable.

## 2014-01-15 NOTE — Assessment & Plan Note (Signed)
The patient had significant decrease in her LDL while she was hyperthyroid. It is now time to recheck her labs.

## 2014-01-15 NOTE — Assessment & Plan Note (Signed)
Blood pressure is controlled. No change in therapy. 

## 2014-01-15 NOTE — Assessment & Plan Note (Signed)
She is required significant potassium over time. Chemistry will be checked today.

## 2014-01-15 NOTE — Assessment & Plan Note (Addendum)
The event occurred in November, 2014. She had rapid improvement in LV function. She has very delayed improvement in her EKG but it is now normal. We will never know if her hyperthyroidism played a role with this or not.  As part of today's evaluation I spent greater than 25 minutes with a total care. More than half of this time is been with direct contact with the patient. We have discussed all aspects of the multiple medical issues that she's had. Fortunately she is doing very well now.

## 2014-01-15 NOTE — Assessment & Plan Note (Signed)
She will be receiving radioactive iodine soon.

## 2014-01-15 NOTE — Assessment & Plan Note (Signed)
She has not been having any recent orthostatic changes. No further workup.

## 2014-01-15 NOTE — Progress Notes (Signed)
Patient ID: [name], female   DOB: Jul 21, 1941, 73 y.o.   MRN: [id number]    HPI  Patient is seen for followup her overall cardiac status. She is actually feeling well. She's been followed very carefully by Dr. Cruzita Lederer for her thyroid problem. She received contrast at the time of her cardiac catheterization, so there had to be a delay before she can have her thyroid fully assessed. Once this was done, it is proven that she does have hyperthyroidism. She will be receiving radioactive iodine treatment in the near future. Her presentation with hyperthyroidism was unusual. She was not tachycardic. In fact she had bradycardia. She did lose weight. She has done much better while being on treatment. I have continued her low dose of a beta blocker because of the diagnosis of hyperthyroidism. While in the hospital originally we had seen some atrial fibrillation. I felt that this could have been related to her hyperthyroidism. I have anticoagulated her. Once her thyroid is completely treated and stable, I plan to stop her anticoagulation unless we see further atrial fibrillation. She is recovered completely from her episode of Takotsubo.  The patient has osteoporosis. She's been having special urine studies done in combination with her doctors here in town and a physician in [place]. Allergies  Allergen Reactions  . Codeine Nausea And Vomiting    Current Outpatient Prescriptions  Medication Sig Dispense Refill  . amitriptyline (ELAVIL) 25 MG tablet Take 25 mg by mouth at bedtime.       Marland Kitchen amLODipine (NORVASC) 5 MG tablet Take 1 tablet (5 mg total) by mouth daily with supper.  90 tablet  3  . apixaban (ELIQUIS) 2.5 MG TABS tablet Take 1 tablet (2.5 mg total) by mouth 2 (two) times daily.  180 tablet  2  . atorvastatin (LIPITOR) 10 MG tablet Take 10 mg by mouth at bedtime.      . Biotin 5000 MCG CAPS Take 1 capsule by mouth daily.       . Calcium Carbonate (CALTRATE 600 PO) Take 1 tablet by mouth  daily.       . carvedilol (COREG) 12.5 MG tablet Take 0.5 tablets (6.25 mg total) by mouth 2 (two) times daily with a meal.      . Cholecalciferol (D3-1000) 1000 UNITS capsule Take 1,000 Units by mouth daily.      . clindamycin (CLEOCIN T) 1 % lotion Apply 1 application topically daily.      . enalapril (VASOTEC) 20 MG tablet TAKE 1 TABLET TWICE A DAY  180 tablet  1  . EVENING PRIMROSE OIL PO Take 1 capsule by mouth daily.       . furosemide (LASIX) 20 MG tablet Take 1 tablet (20 mg total) by mouth daily with breakfast.  90 tablet  2  . meloxicam (MOBIC) 15 MG tablet Take 15 mg by mouth daily.      . methimazole (TAPAZOLE) 5 MG tablet Take 1 tablet (5 mg total) by mouth daily.  30 tablet  2  . methylcellulose packet Take 1 each by mouth daily. CITRICEL      . nitroGLYCERIN (NITROSTAT) 0.4 MG SL tablet Place 1 tablet (0.4 mg total) under the tongue every 5 (five) minutes as needed for chest pain.  25 tablet  12  . potassium chloride SA (KLOR-CON M20) 20 MEQ tablet TAKE 2 TABLETS EVERY MORNING, TAKE 2 TABLETS AT LUNCH AND TAKE 1 TABLET AT DINNER  450 tablet  3   No current facility-administered medications  for this visit.    History   Social History  . Marital Status: Married    Spouse Name: N/A    Number of Children: 1  . Years of Education: N/A   Occupational History  . Homemaker    Social History Main Topics  . Smoking status: Never Smoker   . Smokeless tobacco: Never Used  . Alcohol Use: No  . Drug Use: No  . Sexual Activity: No   Other Topics Concern  . Not on file   Social History Narrative   3 caffeine drinks daily    Married   Retired          Family History  Problem Relation Age of Onset  . Heart attack Mother 93  . Hypertension Mother   . Heart disease Mother   . Stroke Mother   . Heart attack Father 92  . Coronary artery disease Father     Multiple Family Members  . Hypertension Father   . Heart disease Father   . Esophageal cancer Paternal Grandmother    . Colon cancer Maternal Grandfather   . Heart disease Maternal Grandfather   . Irritable bowel syndrome Sister     More family members on father side of   . Hypertension Sister   . Heart disease Sister   . Breast cancer Paternal Aunt     Age 75's  . Heart disease Paternal Grandfather     Past Medical History  Diagnosis Date  . Dyslipidemia   . Polymyalgia rheumatica   . HTN (hypertension)   . Mitral valve prolapse     echo, January, 2011, moderate prolapse with your leaflet, trivial MR   Antibiotic required for procedures  . Tricuspid regurgitation     mild to moderate, echo, January, 2011, PA pressure 38 mm mercury  . 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 Hyponatremia   . Chronic cystitis   . Hemorrhoids   . Hx of colonoscopy   . IBS (irritable bowel syndrome)   . History of colon polyps   . Ejection fraction     EF 65%, echo, 2011  . Potassium (K) deficiency     5 potassium requirement over time  . Syncope     August, 200 weight, dehydration  . 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 CIN I (cervical intraepithelial neoplasia I)   . Osteoporosis   . PONV (postoperative nausea and vomiting)   . Heart murmur     MVP   . Orthostasis     Mild orthostatic change when she stands  . Basal cell carcinoma of nose     removed w/MOHs  . Osteoarthritis     "do not have RA" (06/17/2013)  . CAD (coronary artery disease)     a. NSTEMI 06/2013 => LHC:  mLAD 40, oD2 70 (small), pOM 20, mRCA 20, mid to dist ant HK, EF 30-35% (c/w Tako-Tsubo CM)  . 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 EF 30-35% at [facility] 06/2013;  b.  f/u Echo (06/18/13):  EF 60-65%, normal wall motion, Gr 1 DD, mild MVP of post leaflet, mod TR, PASP 63  . Atrial fibrillation   . Abnormal EKG     Past Surgical History  Procedure Laterality Date  . Excisional hemorrhoidectomy  2003  . Mohs surgery Left 2011    "side of my nose"  (06/17/2013)  . Total knee arthroplasty  08/2010  . Colonoscopy  12/23/2003    normal (indications: prior adenomas and grandfather with colon cancer)  . Colposcopy    . Tonsillectomy    . Total knee arthroplasty  02/25/2012    Procedure: TOTAL KNEE ARTHROPLASTY;  Surgeon: Gearlean Alf, MD;  Location: [facility];  Service: Orthopedics;  Laterality: Right;  . Cardiac catheterization  06/17/2013    Patient Active Problem List   Diagnosis Date Noted  . Dyslipidemia     Priority: High  . Ejection fraction     Priority: High  . Potassium (K) deficiency     Priority: High  . Sinus bradycardia 08/17/2013  . Anticoagulation adequate 07/20/2013  . Graves disease 06/26/2013  . Abnormal EKG   . Takotsubo syndrome 06/19/2013  . Atrial fibrillation 06/19/2013  . Orthostasis   . H/O dizziness 12/26/2012  . Abnormal LFTs 12/26/2012  . NSAID long-term use 12/05/2012  . Acute epigastric pain 12/05/2012  . CIN I (cervical intraepithelial neoplasia I)   . Ringing in ears 05/09/2011  . HTN (hypertension)   . Mitral valve prolapse   . Tricuspid regurgitation   . Subdural hematoma   . Syncope   . Personal history of colonic polyps 02/06/2011  . Personal history of failed moderate sedation 02/06/2011  . RHINITIS 08/23/2010  . ARTHRITIS, KNEE 08/23/2010  . SKIN CANCER, HX OF 02/24/2010  . FX CLSD SKL VLT W/HEM NEC, CONCUSSION NOS 04/07/2007  . POLYMYALGIA RHEUMATICA 04/02/2007  . OSTEOPOROSIS 03/11/2007    ROS  Patient denies fever, chills, headache, sweats, rash, change in vision, change in hearing, chest pain, cough, nausea vomiting, urinary symptoms. All other systems are reviewed and are negative other than the history of present illness.  PHYSICAL EXAM  Filed Vitals:   01/15/14 1410  BP: 115/64  Pulse: 107  Height: 5' 4.5" (1.638 m)  Weight: 139 lb 12.8 oz (63.413 kg)   EKG is done today and reviewed by me. EKG has normalized since the past. It is completely normal.  ASSESSMENT &  PLAN

## 2014-01-18 ENCOUNTER — Other Ambulatory Visit: Payer: Self-pay | Admitting: Gynecology

## 2014-01-18 DIAGNOSIS — M810 Age-related osteoporosis without current pathological fracture: Secondary | ICD-10-CM | POA: Diagnosis not present

## 2014-01-19 ENCOUNTER — Other Ambulatory Visit (INDEPENDENT_AMBULATORY_CARE_PROVIDER_SITE_OTHER): Payer: Medicare Other

## 2014-01-19 DIAGNOSIS — E785 Hyperlipidemia, unspecified: Secondary | ICD-10-CM

## 2014-01-19 LAB — LIPID PANEL
Cholesterol: 130 mg/dL (ref 0–200)
HDL: 72.6 mg/dL (ref >39.00)
LDL Cholesterol: 43 mg/dL (ref 0–99)
NonHDL: 57.4
Total CHOL/HDL Ratio: 2
Triglycerides: 73 mg/dL (ref 0.0–149.0)
VLDL: 14.6 mg/dL (ref 0.0–40.0)

## 2014-01-19 LAB — HEPATIC FUNCTION PANEL
ALT: 17 U/L (ref 0–35)
AST: 24 U/L (ref 0–37)
Albumin: 4.2 g/dL (ref 3.5–5.2)
Alkaline Phosphatase: 71 U/L (ref 39–117)
Bilirubin, Direct: 0.1 mg/dL (ref 0.0–0.3)
Total Bilirubin: 0.6 mg/dL (ref 0.2–1.2)
Total Protein: 6.7 g/dL (ref 6.0–8.3)

## 2014-01-19 LAB — BASIC METABOLIC PANEL
BUN: 18 mg/dL (ref 6–23)
CO2: 24 mEq/L (ref 19–32)
Calcium: 9.6 mg/dL (ref 8.4–10.5)
Chloride: 107 mEq/L (ref 96–112)
Creatinine, Ser: 0.8 mg/dL (ref 0.4–1.2)
GFR: 74.41 mL/min (ref >60.00)
Glucose, Bld: 96 mg/dL (ref 70–99)
Potassium: 4 mEq/L (ref 3.5–5.1)
Sodium: 138 mEq/L (ref 135–145)

## 2014-01-19 LAB — CALCIUM, URINE, 24 HOUR
Calcium, 24 hour urine: 90 mg/d — ABNORMAL LOW (ref 100–250)
Calcium, Ur: 3 mg/dL

## 2014-01-21 ENCOUNTER — Other Ambulatory Visit: Payer: Self-pay | Admitting: Gynecology

## 2014-01-21 ENCOUNTER — Other Ambulatory Visit: Payer: Medicare Other

## 2014-01-21 DIAGNOSIS — M810 Age-related osteoporosis without current pathological fracture: Secondary | ICD-10-CM | POA: Diagnosis not present

## 2014-01-22 ENCOUNTER — Other Ambulatory Visit: Payer: Medicare Other

## 2014-01-23 LAB — COLLAGEN CROSS-LINKED, URINE
Creatinine, Urine: 35.9 mg/dL (ref 20–320)
N-Telopeptide, Urine: 36 (ref 4–64)

## 2014-01-25 NOTE — Telephone Encounter (Signed)
This was done on 01/19/14.

## 2014-01-26 ENCOUNTER — Ambulatory Visit (HOSPITAL_COMMUNITY)
Admission: RE | Admit: 2014-01-26 | Discharge: 2014-01-26 | Disposition: A | Discharge status: Home / Self Care | Payer: Medicare Other | Source: Ambulatory Visit | Attending: Internal Medicine | Admitting: Internal Medicine

## 2014-01-26 DIAGNOSIS — E0590 Thyrotoxicosis, unspecified without thyrotoxic crisis or storm: Secondary | ICD-10-CM | POA: Diagnosis not present

## 2014-01-26 MED ORDER — SODIUM IODIDE I 131 CAPSULE
12.0000 | Freq: Once | INTRAVENOUS | Status: AC | PRN
  Administered 2014-01-26: 12 via ORAL

## 2014-02-01 ENCOUNTER — Telehealth: Payer: Self-pay | Admitting: Internal Medicine

## 2014-02-01 NOTE — Telephone Encounter (Signed)
Please read note below and advise.  

## 2014-02-01 NOTE — Telephone Encounter (Signed)
Patient would like to know if Dr. Cruzita Lederer needs her to do labs? If so when   Thank you :)

## 2014-02-01 NOTE — Telephone Encounter (Signed)
I will check them at next visit (07/30). We need her to be at least 1 mo after the RAI tx.

## 2014-02-02 NOTE — Telephone Encounter (Signed)
Called pt advised her per Dr Arman Filter note. Pt understood.

## 2014-02-08 ENCOUNTER — Telehealth: Payer: Self-pay | Admitting: *Deleted

## 2014-02-08 NOTE — Telephone Encounter (Signed)
Pt asked that 24 hour labs results be re-faxed to Dr. Marcelino Scot office at [phone] this was done with confirmation as well

## 2014-02-16 ENCOUNTER — Telehealth: Payer: Self-pay | Admitting: *Deleted

## 2014-02-16 NOTE — Telephone Encounter (Signed)
Pt called and is ready to set up Reclast. She had labs with another provider in June. Insurance is checked and she has Medicare and an Nurse, children's. Medicare covers the Reclast and the supplement plan picks up what Medicare doesn't pay. Pt informed and wants to proceed. Reclast scheduled 02/18/14 at [facility] to arrive at 945am Main Entrance and go to admitting and staff will take the patient to 2nd Floor Medical day. Advised to take Tylenol Extra Strength three times a day starting tomorrow for a total of 3 days. Order will be faxed. KW CMA

## 2014-02-17 ENCOUNTER — Other Ambulatory Visit (HOSPITAL_COMMUNITY): Payer: Self-pay | Admitting: *Deleted

## 2014-02-18 ENCOUNTER — Encounter (HOSPITAL_COMMUNITY)
Admission: RE | Admit: 2014-02-18 | Discharge: 2014-02-18 | Disposition: A | Discharge status: Home / Self Care | Payer: Medicare Other | Source: Ambulatory Visit | Attending: Gynecology | Admitting: Gynecology

## 2014-02-18 DIAGNOSIS — M810 Age-related osteoporosis without current pathological fracture: Secondary | ICD-10-CM | POA: Diagnosis not present

## 2014-02-18 MED ORDER — ZOLEDRONIC ACID 5 MG/100ML IV SOLN: 5.0000 mg | Freq: Once | INTRAVENOUS | Status: DC

## 2014-02-18 MED ORDER — ZOLEDRONIC ACID 5 MG/100ML IV SOLN
INTRAVENOUS | Status: AC
  Administered 2014-02-18: 5 mg
  Filled 2014-02-18: qty 100

## 2014-02-18 MED ORDER — SODIUM CHLORIDE 0.9 % IV SOLN: Freq: Once | INTRAVENOUS | Status: DC

## 2014-02-22 DIAGNOSIS — H52209 Unspecified astigmatism, unspecified eye: Secondary | ICD-10-CM | POA: Diagnosis not present

## 2014-02-22 DIAGNOSIS — H2510 Age-related nuclear cataract, unspecified eye: Secondary | ICD-10-CM | POA: Diagnosis not present

## 2014-03-04 ENCOUNTER — Ambulatory Visit (INDEPENDENT_AMBULATORY_CARE_PROVIDER_SITE_OTHER): Payer: Medicare Other | Admitting: Internal Medicine

## 2014-03-04 ENCOUNTER — Encounter: Payer: Self-pay | Admitting: Internal Medicine

## 2014-03-04 VITALS — BP 104/64 | HR 57 | Temp 97.8°F | Resp 12 | Wt 142.0 lb

## 2014-03-04 DIAGNOSIS — E0500 Thyrotoxicosis with diffuse goiter without thyrotoxic crisis or storm: Secondary | ICD-10-CM | POA: Diagnosis not present

## 2014-03-04 LAB — T3, FREE: T3, Free: 2.4 pg/mL (ref 2.3–4.2)

## 2014-03-04 LAB — TSH: TSH: 0.06 u[IU]/mL — ABNORMAL LOW (ref 0.35–4.50)

## 2014-03-04 LAB — T4, FREE: Free T4: 1.77 ng/dL — ABNORMAL HIGH (ref 0.60–1.60)

## 2014-03-04 NOTE — Progress Notes (Signed)
Patient ID: [name], female   DOB: August 25, 1940, 73 y.o.   MRN: [id number]   HPI  [name] is a 73 y.o.-year-old female, returning for f/u for new dx of Graves ds (dx after last visit). Last visit 3 mo ago.  Reviewed hx: Patient was admitted to the hospital 06/2013 with Takotsubo cardiomyopathy, and a low TSH was found incidentally. She had cardiac catheterization on 06/17/2013 (after blood for TSH was drawn). She was also found to have atrial fibrillation in the hospital, now resolved (was on Coreg in the past and continues now). The patient's EF also normalized before discharge.  She was scheduled to have a hospital followup appointment with her PCP. Dr. Regis Bill repeated a TSH level and added a free T4 and a free T3 and these returned abnormal, suggesting thyrotoxicosis. Of note, her pulse was in the 50s and she was not in atrial fibrillation at the time of the appointment. Patient was referred to endocrinology for for further management.  I reviewed pt's thyroid tests: Lab Results  Component Value Date   TSH 0.01* 12/01/2013   TSH 0.00* 10/26/2013   TSH 0.02* 09/28/2013   TSH 0.13* 08/25/2013   TSH 0.03* 06/26/2013   TSH <0.008* 06/17/2013   TSH 0.69 08/16/2011   TSH 2.12 10/08/2006   FREET4 1.44 12/01/2013   FREET4 1.64* 10/26/2013   FREET4 1.96* 09/28/2013   FREET4 0.48* 08/25/2013   FREET4 2.38* 06/26/2013   FREET4 1.0 10/08/2006  fT3 on 06/26/2013: 6.4 (2.3 - 4.2)   We started 5 mg bid of MMI as we could not check an Uptake and scan (pt was post catheterization, >> high iodine load). She had a great response to this dose:   Component     Latest Ref Rng 06/26/2013 08/25/2013  T3, Free     2.3 - 4.2 pg/mL 6.4 (H) 2.1 (L)  Free T4     0.60 - 1.60 ng/dL 2.38 (H) 0.48 (L)  TSH     0.35 - 5.50 uIU/mL 0.03 (L) 0.13 (L)   We decreased the MMI dose from  5 mg in am and 2.5 mg in pm:  Component     Latest Ref Rng 09/28/2013  TSH     0.35 - 5.50 uIU/mL 0.02 (L)  Free T4      0.60 - 1.60 ng/dL 1.96 (H)  T3, Free     2.3 - 4.2 pg/mL 3.1   We then had to increase the dose back to 5 mg bid:  Component     Latest Ref Rng 10/26/2013  T3, Free     2.3 - 4.2 pg/mL 3.0  Free T4     0.60 - 1.60 ng/dL 1.64 (H)  TSH     0.35 - 5.50 uIU/mL 0.00 (L)   We obtained an Uptake and scan (12/28/2013): IMPRESSION: Markedly elevated 24 hr radio iodine uptake of 82% Normal thyroid scan. Findings consistent with Graves disease.  Pt had RAI Tx 01/26/2014.  She is still on MMI 5 mg daily, unfortunately as she did not understand she needs to stop it after the RAI tx (this was not communicated to her).  She feels much better. Pt c/o: - + weight gain: 2-3 lbs - resolved excessive sweating/heat intolerance - resolved  tremors - resolved  anxiety - resolved  palpitations - resolved  Hyperdefecation, has a little constipation - resolved  weight loss (from 164 lbs in 02/2012 to 137 lbs this month) - resolved  fatigue  Pt  denies feeling nodules in neck, hoarseness, dysphagia/odynophagia, SOB with lying down.  She also has a h/o hypertension, hyperlipidemia, tricuspid regurgitation, mitral valve prolapse, PMR.  ROS: Constitutional: + weight gain, no fatigue, no subjective hyperthermia/hypothermia Eyes: no blurry vision, no xerophthalmia ENT: no sore throat, no nodules palpated in throat, no dysphagia/odynophagia, no hoarseness Cardiovascular: no CP/SOB/palpitations/+ leg swelling Respiratory: no cough/SOB Gastrointestinal: no N/V/D/C Musculoskeletal: no muscle/joint aches Skin: no rashes Neurological: no tremors/numbness/tingling/dizziness  I reviewed pt's medications, allergies, PMH, social hx, family hx and no changes required, except as mentioned above.  PE: BP 104/64  Pulse 57  Temp(Src) 97.8 F (36.6 C) (Oral)  Resp 12  Wt 142 lb (64.411 kg)  SpO2 98% Wt Readings from Last 3 Encounters:  03/04/14 142 lb (64.411 kg)  02/18/14 140 lb (63.504 kg)   01/15/14 139 lb 12.8 oz (63.413 kg)  Body mass index is 24.01 kg/(m^2).  Constitutional: normal weight, in NAD Eyes: PERRLA, EOMI, no exophthalmos, no lid lag, no stare ENT: moist mucous membranes, slight thyromegaly, no cervical lymphadenopathy Cardiovascular: RRR, No MRG Respiratory: CTA B Gastrointestinal: abdomen soft, NT, ND, BS+ Musculoskeletal: no deformities, strength intact in all 4 Skin: moist, warm, no rashes Neurological: no tremor with outstretched hands, could not perform DTR - patient with TKR in both knees   ASSESSMENT: 1. Graves ds  PLAN:  1. Patient with Graves ds., s/p RAI tx 1.5 mo ago, still on low dose MMI 5 mg daily. - advised to stop MMI  - I will recheck her TFTs now and in 6 weeks - I advised her to: Patient Instructions  Please stop at the lab. Please stop methimazole. Please come back in 6 weeks for labs. Please come back for a follow-up appointment in 4 months - she is on a beta blocker >> continue Coreg - per cardiology - RTC in 4 months   Office Visit on 03/04/2014  Component Date Value Ref Range Status  . TSH 03/04/2014 0.06* 0.35 - 4.50 uIU/mL Final  . Free T4 03/04/2014 1.77* 0.60 - 1.60 ng/dL Final  . T3, Free 03/04/2014 2.4  2.3 - 4.2 pg/mL Final   Msg sent: Dear [name], Labs shows that you probably did a great thing taking the Methimazole as the thyroid tests increased a little after the RAI therapy. The TSH is still low, as the TSH lags behind the rest of the tests during recovery. You can still stop it >> will recheck in 5-6 weeks. Sincerely, Philemon Kingdom MD

## 2014-03-04 NOTE — Patient Instructions (Signed)
Please stop at the lab. Please stop methimazole. Please come back in 6 weeks for labs. Please come back for a follow-up appointment in 4 months.

## 2014-03-15 DIAGNOSIS — M353 Polymyalgia rheumatica: Secondary | ICD-10-CM | POA: Diagnosis not present

## 2014-03-15 DIAGNOSIS — IMO0002 Reserved for concepts with insufficient information to code with codable children: Secondary | ICD-10-CM | POA: Diagnosis not present

## 2014-03-15 DIAGNOSIS — M159 Polyosteoarthritis, unspecified: Secondary | ICD-10-CM | POA: Diagnosis not present

## 2014-03-15 DIAGNOSIS — M810 Age-related osteoporosis without current pathological fracture: Secondary | ICD-10-CM | POA: Diagnosis not present

## 2014-04-15 ENCOUNTER — Other Ambulatory Visit: Payer: Self-pay | Admitting: Internal Medicine

## 2014-04-15 ENCOUNTER — Other Ambulatory Visit (INDEPENDENT_AMBULATORY_CARE_PROVIDER_SITE_OTHER): Payer: Medicare Other

## 2014-04-15 DIAGNOSIS — E890 Postprocedural hypothyroidism: Secondary | ICD-10-CM | POA: Insufficient documentation

## 2014-04-15 DIAGNOSIS — E0500 Thyrotoxicosis with diffuse goiter without thyrotoxic crisis or storm: Secondary | ICD-10-CM | POA: Diagnosis not present

## 2014-04-15 LAB — T4, FREE: Free T4: 1.23 ng/dL (ref 0.60–1.60)

## 2014-04-15 LAB — TSH: TSH: 6.03 u[IU]/mL — ABNORMAL HIGH (ref 0.35–4.50)

## 2014-04-15 LAB — T3, FREE: T3, Free: 2 pg/mL — ABNORMAL LOW (ref 2.3–4.2)

## 2014-04-15 MED ORDER — LEVOTHYROXINE SODIUM 25 MCG PO TABS: 25.0000 ug | ORAL_TABLET | Freq: Every day | ORAL | Status: DC

## 2014-04-16 ENCOUNTER — Telehealth: Payer: Self-pay | Admitting: Internal Medicine

## 2014-04-16 NOTE — Telephone Encounter (Signed)
Patient would like to know if she can take the clorcon an hour after her thyroid medicine or how long should she wait   Please advise   Thank you

## 2014-04-16 NOTE — Telephone Encounter (Signed)
Called pt and advised her that she should be at least 4 hours after thyroid medication. Pt understood.

## 2014-04-19 ENCOUNTER — Telehealth: Payer: Self-pay | Admitting: Internal Medicine

## 2014-04-19 NOTE — Telephone Encounter (Signed)
Called pt and advised her per Dr Arman Filter note. Pt stated that this only the second day that she has felt this way. Pt questioned maybe it's a virus. She sees a new PCP on Thurs and will discuss with them. Advised pt maybe she should rest her body today. Pt agreed.

## 2014-04-19 NOTE — Telephone Encounter (Signed)
Please read note below and advise.  

## 2014-04-19 NOTE — Telephone Encounter (Signed)
Patient is taking the Radioactive Iodine treatment, it is making her nauseated, is that a reaction, should she be feeling this way.  Please advise.

## 2014-04-19 NOTE — Telephone Encounter (Signed)
I thinks she means she takes the Levothyroxine. It should not make her feel nauseated. Is there anything else that she recently started? If not, we may try to send 50 mcg tablets and have her cut them in 2. The 50 mcg does not have coloring agents or other excipients.

## 2014-04-22 DIAGNOSIS — M159 Polyosteoarthritis, unspecified: Secondary | ICD-10-CM | POA: Diagnosis not present

## 2014-04-22 DIAGNOSIS — M810 Age-related osteoporosis without current pathological fracture: Secondary | ICD-10-CM | POA: Diagnosis not present

## 2014-04-22 DIAGNOSIS — M353 Polymyalgia rheumatica: Secondary | ICD-10-CM | POA: Diagnosis not present

## 2014-04-22 DIAGNOSIS — I4891 Unspecified atrial fibrillation: Secondary | ICD-10-CM | POA: Diagnosis not present

## 2014-04-22 DIAGNOSIS — R609 Edema, unspecified: Secondary | ICD-10-CM | POA: Diagnosis not present

## 2014-05-01 ENCOUNTER — Other Ambulatory Visit: Payer: Self-pay | Admitting: Cardiology

## 2014-05-07 DIAGNOSIS — M4806 Spinal stenosis, lumbar region: Secondary | ICD-10-CM | POA: Diagnosis not present

## 2014-05-07 DIAGNOSIS — M5417 Radiculopathy, lumbosacral region: Secondary | ICD-10-CM | POA: Diagnosis not present

## 2014-05-11 ENCOUNTER — Telehealth: Payer: Self-pay | Admitting: *Deleted

## 2014-05-11 ENCOUNTER — Telehealth: Payer: Self-pay | Admitting: Cardiology

## 2014-05-11 MED ORDER — APIXABAN 2.5 MG PO TABS: 2.5000 mg | ORAL_TABLET | Freq: Two times a day (BID) | ORAL | Status: DC

## 2014-05-11 NOTE — Telephone Encounter (Signed)
New problem    Pt need a prescription called in to [facility] for Eliquis.

## 2014-05-11 NOTE — Telephone Encounter (Signed)
Refill for Eliquis sent to [facility] [address] to fill. # [phone] with 2 refills.

## 2014-05-11 NOTE — Telephone Encounter (Signed)
Eliquis samples placed at the front desk for pick up. 

## 2014-05-13 ENCOUNTER — Other Ambulatory Visit: Payer: Medicare Other

## 2014-05-13 ENCOUNTER — Other Ambulatory Visit (INDEPENDENT_AMBULATORY_CARE_PROVIDER_SITE_OTHER): Payer: Medicare Other

## 2014-05-13 DIAGNOSIS — E890 Postprocedural hypothyroidism: Secondary | ICD-10-CM

## 2014-05-13 DIAGNOSIS — M5430 Sciatica, unspecified side: Secondary | ICD-10-CM | POA: Diagnosis not present

## 2014-05-13 DIAGNOSIS — M4806 Spinal stenosis, lumbar region: Secondary | ICD-10-CM | POA: Diagnosis not present

## 2014-05-13 LAB — TSH: TSH: 15.69 u[IU]/mL — ABNORMAL HIGH (ref 0.35–4.50)

## 2014-05-13 LAB — T4, FREE: Free T4: 0.85 ng/dL (ref 0.60–1.60)

## 2014-05-16 DIAGNOSIS — Z23 Encounter for immunization: Secondary | ICD-10-CM | POA: Diagnosis not present

## 2014-05-19 ENCOUNTER — Other Ambulatory Visit: Payer: Self-pay | Admitting: Orthopedic Surgery

## 2014-05-19 DIAGNOSIS — M545 Low back pain, unspecified: Secondary | ICD-10-CM

## 2014-05-21 ENCOUNTER — Telehealth: Payer: Self-pay | Admitting: Internal Medicine

## 2014-05-21 ENCOUNTER — Other Ambulatory Visit: Payer: Self-pay | Admitting: *Deleted

## 2014-05-21 ENCOUNTER — Other Ambulatory Visit: Payer: Self-pay

## 2014-05-21 MED ORDER — LEVOTHYROXINE SODIUM 25 MCG PO TABS: 25.0000 ug | ORAL_TABLET | Freq: Every day | ORAL | Status: DC

## 2014-05-21 NOTE — Telephone Encounter (Signed)
Patient called and wanting levothyroxine 12mcg 2 tabs daily   Patient is concerned of her TSH   Pharmacy: [facility]   Thank you

## 2014-05-21 NOTE — Telephone Encounter (Signed)
Returned call to pt and advised her that we sent her rx refill in to the pharmacy. Pt understood.

## 2014-05-24 ENCOUNTER — Other Ambulatory Visit: Payer: Self-pay | Admitting: *Deleted

## 2014-05-24 MED ORDER — LEVOTHYROXINE SODIUM 25 MCG PO TABS: ORAL_TABLET | ORAL | Status: DC

## 2014-05-24 NOTE — Telephone Encounter (Signed)
Had to change to the appropriate dosage. Done.

## 2014-05-27 ENCOUNTER — Ambulatory Visit
Admission: RE | Admit: 2014-05-27 | Discharge: 2014-05-27 | Disposition: A | Discharge status: Home / Self Care | Payer: Medicare Other | Source: Ambulatory Visit | Attending: Orthopedic Surgery | Admitting: Orthopedic Surgery

## 2014-05-27 DIAGNOSIS — M47816 Spondylosis without myelopathy or radiculopathy, lumbar region: Secondary | ICD-10-CM | POA: Diagnosis not present

## 2014-05-27 DIAGNOSIS — M4806 Spinal stenosis, lumbar region: Secondary | ICD-10-CM | POA: Diagnosis not present

## 2014-05-27 DIAGNOSIS — M545 Low back pain, unspecified: Secondary | ICD-10-CM

## 2014-05-27 DIAGNOSIS — M5136 Other intervertebral disc degeneration, lumbar region: Secondary | ICD-10-CM | POA: Diagnosis not present

## 2014-05-28 ENCOUNTER — Other Ambulatory Visit: Payer: Self-pay | Admitting: Cardiology

## 2014-05-31 ENCOUNTER — Encounter: Payer: Self-pay | Admitting: Cardiology

## 2014-05-31 DIAGNOSIS — I10 Essential (primary) hypertension: Secondary | ICD-10-CM | POA: Diagnosis not present

## 2014-05-31 DIAGNOSIS — M4120 Other idiopathic scoliosis, site unspecified: Secondary | ICD-10-CM | POA: Diagnosis not present

## 2014-05-31 DIAGNOSIS — Z6826 Body mass index (BMI) 26.0-26.9, adult: Secondary | ICD-10-CM | POA: Diagnosis not present

## 2014-05-31 DIAGNOSIS — M5416 Radiculopathy, lumbar region: Secondary | ICD-10-CM | POA: Diagnosis not present

## 2014-05-31 DIAGNOSIS — M5126 Other intervertebral disc displacement, lumbar region: Secondary | ICD-10-CM | POA: Diagnosis not present

## 2014-05-31 DIAGNOSIS — M4806 Spinal stenosis, lumbar region: Secondary | ICD-10-CM | POA: Diagnosis not present

## 2014-05-31 DIAGNOSIS — M4316 Spondylolisthesis, lumbar region: Secondary | ICD-10-CM | POA: Diagnosis not present

## 2014-05-31 NOTE — Progress Notes (Signed)
Patient ID: [name], female   DOB: 10/30/1940, 73 y.o.   MRN: [id number]  I spoke with Dr. Vertell Limber neurosurgery today. He was calling to ask if it would be safe for the patient to hold her Eliquis for an injection. I told him that this would be fine. She will hold for 72 hours before the procedure.  Daryel November, MD

## 2014-06-07 ENCOUNTER — Encounter: Payer: Self-pay | Admitting: Internal Medicine

## 2014-06-17 DIAGNOSIS — M4120 Other idiopathic scoliosis, site unspecified: Secondary | ICD-10-CM | POA: Diagnosis not present

## 2014-06-17 DIAGNOSIS — M5416 Radiculopathy, lumbar region: Secondary | ICD-10-CM | POA: Diagnosis not present

## 2014-06-17 DIAGNOSIS — M4806 Spinal stenosis, lumbar region: Secondary | ICD-10-CM | POA: Diagnosis not present

## 2014-06-18 ENCOUNTER — Telehealth: Payer: Self-pay | Admitting: Cardiology

## 2014-06-18 NOTE — Telephone Encounter (Signed)
Error

## 2014-06-23 DIAGNOSIS — M5116 Intervertebral disc disorders with radiculopathy, lumbar region: Secondary | ICD-10-CM | POA: Diagnosis not present

## 2014-06-24 ENCOUNTER — Telehealth: Payer: Self-pay | Admitting: *Deleted

## 2014-06-24 NOTE — Telephone Encounter (Signed)
Eliquis samples placed at the front desk for patient. 

## 2014-06-29 DIAGNOSIS — M5116 Intervertebral disc disorders with radiculopathy, lumbar region: Secondary | ICD-10-CM | POA: Diagnosis not present

## 2014-07-05 ENCOUNTER — Ambulatory Visit (INDEPENDENT_AMBULATORY_CARE_PROVIDER_SITE_OTHER): Payer: Medicare Other | Admitting: Internal Medicine

## 2014-07-05 VITALS — BP 138/80 | HR 63 | Temp 97.3°F | Wt 155.6 lb

## 2014-07-05 DIAGNOSIS — E0500 Thyrotoxicosis with diffuse goiter without thyrotoxic crisis or storm: Secondary | ICD-10-CM

## 2014-07-05 DIAGNOSIS — E890 Postprocedural hypothyroidism: Secondary | ICD-10-CM

## 2014-07-05 LAB — TSH: TSH: 17.05 u[IU]/mL — ABNORMAL HIGH (ref 0.35–4.50)

## 2014-07-05 LAB — T4, FREE: Free T4: 0.96 ng/dL (ref 0.60–1.60)

## 2014-07-05 NOTE — Progress Notes (Signed)
Pre visit review using our clinic review tool, if applicable. No additional management support is needed unless otherwise documented below in the visit note. 

## 2014-07-05 NOTE — Patient Instructions (Signed)
Please stop at the lab.  Take the thyroid hormone every day, with water, >30 minutes before breakfast, separated by >4 hours from acid reflux medications, calcium, iron, multivitamins.  Please come back for a follow-up appointment in 6 months.

## 2014-07-05 NOTE — Progress Notes (Signed)
Patient ID: [name], female   DOB: 06-Jan-1941, 73 y.o.   MRN: [id number]   HPI  [name] is a 73 y.o.-year-old female, returning for f/u for new dx of Graves ds (dx after last visit). Last visit 3 mo ago.  She got 2 tapers of Prednisone, now on epidurals, last 3 weeks ago (for sciatica).   Reviewed hx: Patient was admitted to the hospital 06/2013 with Takotsubo cardiomyopathy, and a low TSH was found incidentally. She had cardiac catheterization on 06/17/2013 (after blood for TSH was drawn). She was also found to have atrial fibrillation in the hospital, now resolved (was on Coreg in the past and continues now). The patient's EF also normalized before discharge.  She was scheduled to have a hospital followup appointment with her PCP. Dr. Regis Bill repeated a TSH level and added a free T4 and a free T3 and these returned abnormal, suggesting thyrotoxicosis. Of note, her pulse was in the 50s and she was not in atrial fibrillation at the time of the appointment. Patient was referred to endocrinology for for further management.  We obtained an Uptake and scan (12/28/2013): Markedly elevated 24 hr radio iodine uptake of 82% Normal thyroid scan. Findings consistent with Graves disease.  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  We started LT4 25 mcg daily >> increased to 50 mcg daily after last TSH check.  She is taking the LT4: - at 4-5 am - with eater - goes back to sleep - eats b'fast at 8:30-9 am - no PPI - takes Ca with lunch and dinner - vit D at lunch - no MVI  I reviewed pt's thyroid tests: Lab Results  Component Value Date   TSH 15.69* 05/13/2014   TSH 6.03* 04/15/2014   TSH 0.06* 03/04/2014   TSH 0.01* 12/01/2013   TSH 0.00* 10/26/2013   TSH 0.02* 09/28/2013   TSH 0.13* 08/25/2013   TSH 0.03* 06/26/2013   TSH <0.008* 06/17/2013   TSH 0.69 08/16/2011   FREET4 0.85 05/13/2014   FREET4 1.23 04/15/2014   FREET4  1.77* 03/04/2014   FREET4 1.44 12/01/2013   FREET4 1.64* 10/26/2013   FREET4 1.96* 09/28/2013   FREET4 0.48* 08/25/2013   FREET4 2.38* 06/26/2013   FREET4 1.0 10/08/2006  fT3 on 06/26/2013: 6.4 (2.3 - 4.2)   Pt c/o: - + weight gain: 13 lbs since last visit - cold intolerance - fatigue - constipation  Pt denies feeling nodules in neck, hoarseness, dysphagia/odynophagia, SOB with lying down.  She also has a h/o hypertension, hyperlipidemia, tricuspid regurgitation, mitral valve prolapse, PMR.  ROS: Constitutional: see HPI Eyes: no blurry vision, no xerophthalmia ENT: no sore throat, no nodules palpated in throat, no dysphagia/odynophagia, no hoarseness Cardiovascular: no CP/SOB/palpitations/+ leg swelling Respiratory: no cough/SOB Gastrointestinal: no N/V/D/C Musculoskeletal: no muscle/+ joint aches Skin: no rashes Neurological: no tremors/numbness/tingling/dizziness  I reviewed pt's medications, allergies, PMH, social hx, family hx and no changes required, except as mentioned above.  PE: BP 138/80 mmHg  Pulse 63  Temp(Src) 97.3 F (36.3 C) (Oral)  Wt 155 lb 9.6 oz (70.58 kg)  SpO2 98% Body mass index is 26.31 kg/(m^2). Wt Readings from Last 3 Encounters:  07/05/14 155 lb 9.6 oz (70.58 kg)  03/04/14 142 lb (64.411 kg)  02/18/14 140 lb (63.504 kg)  Body mass index is 26.31 kg/(m^2).  Constitutional: normal weight, in NAD Eyes: PERRLA, EOMI, no exophthalmos, no lid lag, no stare ENT: moist mucous membranes, slight  thyromegaly, no cervical lymphadenopathy Cardiovascular: RRR, No MRG Respiratory: CTA B Gastrointestinal: abdomen soft, NT, ND, BS+ Musculoskeletal: no deformities, strength intact in all 4 Skin: moist, warm, no rashes Neurological: no tremor with outstretched hands, could not perform DTR - patient with TKR in both knees   ASSESSMENT: 1. H/o Graves ds  2. Postablative hypothyroidism  PLAN:  1. Patient with Graves ds., s/p RAI tx 5 mo ago, now with  postablative hypothyroidism. - I will recheck her TFTs now  - she is on a beta blocker >> continue Coreg - per cardiology - discussed about the fact that her chances of Graves recurrence are greatly reduced 2/2 her becoming hypothyroid post RAI tx  2. Postablative hypothyroidism - will check TFTs today - discussed to start taking LT4 at 8 am when she wakes up, not at 4 am - advised to take the thyroid hormone every day, with water, >30 minutes before breakfast, separated by >4 hours from acid reflux medications, calcium, iron, multivitamins. - RTC in 4 months   Office Visit on 07/05/2014  Component Date Value Ref Range Status  . TSH 07/05/2014 17.05* 0.35 - 4.50 uIU/mL Final  . Free T4 07/05/2014 0.96  0.60 - 1.60 ng/dL Final   TSH still high >> increase LT4 to 75 mcg daily, recheck labs in 5 weeks >> will likely need to increase further but would not like to do this abruptly.

## 2014-07-06 ENCOUNTER — Encounter: Payer: Self-pay | Admitting: Internal Medicine

## 2014-07-06 MED ORDER — LEVOTHYROXINE SODIUM 75 MCG PO TABS: 75.0000 ug | ORAL_TABLET | Freq: Every day | ORAL | Status: DC

## 2014-07-07 DIAGNOSIS — M5116 Intervertebral disc disorders with radiculopathy, lumbar region: Secondary | ICD-10-CM | POA: Diagnosis not present

## 2014-07-08 ENCOUNTER — Telehealth: Payer: Self-pay | Admitting: Cardiology

## 2014-07-08 ENCOUNTER — Telehealth: Payer: Self-pay

## 2014-07-08 NOTE — Telephone Encounter (Signed)
New Msg   Pt is returning a call. Please contact at [phone].

## 2014-07-08 NOTE — Telephone Encounter (Signed)
Returned patient call. Found samples up front for patient's medication eliquis 2.5 mg. Patient stated she would be leaving soon to pickup.

## 2014-07-08 NOTE — Telephone Encounter (Signed)
[facility]  ABOUT GETTING SAMPLES OF ELIQUIS 2.5 MG . WE DID NOT HAVE ANY SAMPLES

## 2014-07-12 DIAGNOSIS — M5116 Intervertebral disc disorders with radiculopathy, lumbar region: Secondary | ICD-10-CM | POA: Diagnosis not present

## 2014-07-13 ENCOUNTER — Other Ambulatory Visit: Payer: Self-pay | Admitting: Family Medicine

## 2014-07-13 ENCOUNTER — Encounter: Payer: Self-pay | Admitting: Internal Medicine

## 2014-07-14 ENCOUNTER — Telehealth: Payer: Self-pay | Admitting: Cardiology

## 2014-07-14 ENCOUNTER — Encounter: Payer: Self-pay | Admitting: Cardiology

## 2014-07-14 ENCOUNTER — Ambulatory Visit (INDEPENDENT_AMBULATORY_CARE_PROVIDER_SITE_OTHER): Payer: Medicare Other | Admitting: Cardiology

## 2014-07-14 ENCOUNTER — Other Ambulatory Visit: Payer: Self-pay | Admitting: *Deleted

## 2014-07-14 VITALS — BP 142/90 | HR 62 | Ht 64.5 in | Wt 154.0 lb

## 2014-07-14 DIAGNOSIS — I951 Orthostatic hypotension: Secondary | ICD-10-CM

## 2014-07-14 DIAGNOSIS — I480 Paroxysmal atrial fibrillation: Secondary | ICD-10-CM

## 2014-07-14 DIAGNOSIS — E0500 Thyrotoxicosis with diffuse goiter without thyrotoxic crisis or storm: Secondary | ICD-10-CM

## 2014-07-14 DIAGNOSIS — I5181 Takotsubo syndrome: Secondary | ICD-10-CM | POA: Diagnosis not present

## 2014-07-14 DIAGNOSIS — Z7901 Long term (current) use of anticoagulants: Secondary | ICD-10-CM

## 2014-07-14 DIAGNOSIS — I10 Essential (primary) hypertension: Secondary | ICD-10-CM | POA: Diagnosis not present

## 2014-07-14 MED ORDER — LEVOTHYROXINE SODIUM 75 MCG PO TABS: 75.0000 ug | ORAL_TABLET | Freq: Every day | ORAL | Status: DC

## 2014-07-14 NOTE — Telephone Encounter (Signed)
error 

## 2014-07-14 NOTE — Assessment & Plan Note (Signed)
She is not having any significant orthostatic symptoms. No change in therapy.

## 2014-07-14 NOTE — Progress Notes (Signed)
Patient ID: [name], female   DOB: 1940/09/11, 73 y.o.   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 Her LV normalize completely. Initially she had dramatic EKG changes. These improved completely also. At the same time she appeared to have hyperthyroidism. Her thyroid has now been treated with radioactive iodine. She is receiving thyroid replacement, but her TSH is still elevated. Doses being adjusted carefully by Dr. Letta Median. She is feeling well overall. She is not having any chest pain or palpitations.  During her hospitalization with her acute event, we saw a brief episode of atrial fibrillation. I decided to use anticoagulation until now. Since her hyperthyroidism is now completely treated, I feel comfortable stopping the anticoagulation. We have no evidence that she has atrial fibrillation at other times.  Allergies  Allergen Reactions  . Codeine Nausea And Vomiting    Current Outpatient Prescriptions  Medication Sig Dispense Refill  . amitriptyline (ELAVIL) 25 MG tablet Take 25 mg by mouth at bedtime.     Marland Kitchen amLODipine (NORVASC) 5 MG tablet Take 1 tablet (5 mg total) by mouth daily with supper. 90 tablet 3  . apixaban (ELIQUIS) 2.5 MG TABS tablet Take 1 tablet (2.5 mg total) by mouth 2 (two) times daily. 180 tablet 2  . atorvastatin (LIPITOR) 10 MG tablet TAKE 1 TABLET DAILY WITH   BREAKFAST 90 tablet 0  . Biotin 5000 MCG CAPS Take 1 capsule by mouth daily.     . Calcium Carbonate (CALTRATE 600 PO) Take 1 tablet by mouth daily.     . carvedilol (COREG) 12.5 MG tablet TAKE 1 TABLET TWICE A DAY  WITH A MEAL (Patient taking differently: TAKE 1 TABLET ONCE DAILY.) 180 tablet 0  . Cholecalciferol (D3-1000) 1000 UNITS capsule Take 1,000 Units by mouth daily.    . clindamycin (CLEOCIN T) 1 % lotion Apply 1 application topically daily.    . enalapril (VASOTEC) 20 MG tablet TAKE 1 TABLET TWICE A DAY 180 tablet 1  . EVENING PRIMROSE  OIL PO Take 1 capsule by mouth daily.     . furosemide (LASIX) 20 MG tablet TAKE 1 TABLET DAILY WITH   BREAKFAST 90 tablet 0  . levothyroxine (SYNTHROID, LEVOTHROID) 75 MCG tablet Take 1 tablet (75 mcg total) by mouth daily. 30 tablet 1  . meloxicam (MOBIC) 15 MG tablet Take 15 mg by mouth daily.    . methylcellulose packet Take 1 each by mouth daily. CITRICEL    . nitroGLYCERIN (NITROSTAT) 0.4 MG SL tablet Place 1 tablet (0.4 mg total) under the tongue every 5 (five) minutes as needed for chest pain. 25 tablet 12  . potassium chloride SA (KLOR-CON M20) 20 MEQ tablet TAKE 2 TABLETS EVERY MORNING, TAKE 2 TABLETS AT LUNCH AND TAKE 1 TABLET AT DINNER 450 tablet 3   No current facility-administered medications for this visit.    History   Social History  . Marital Status: Married    Spouse Name: N/A    Number of Children: 1  . Years of Education: N/A   Occupational History  . Homemaker    Social History Main Topics  . Smoking status: Never Smoker   . Smokeless tobacco: Never Used  . Alcohol Use: No  . Drug Use: No  . Sexual Activity: No   Other Topics Concern  . Not on file   Social History Narrative   3 caffeine drinks daily    Married   Retired  Family History  Problem Relation Age of Onset  . Heart attack Mother 8  . Hypertension Mother   . Heart disease Mother   . Stroke Mother   . Heart attack Father 5  . Coronary artery disease Father     Multiple Family Members  . Hypertension Father   . Heart disease Father   . Esophageal cancer Paternal Grandmother   . Colon cancer Maternal Grandfather   . Heart disease Maternal Grandfather   . Irritable bowel syndrome Sister     More family members on father side of   . Hypertension Sister   . Heart disease Sister   . Breast cancer Paternal Aunt     Age 15's  . Heart disease Paternal Grandfather     Past Medical History  Diagnosis Date  . Dyslipidemia   . Polymyalgia rheumatica   . HTN (hypertension)    . Mitral valve prolapse     echo, January, 2011, moderate prolapse with your leaflet, trivial MR   Antibiotic required for procedures  . Tricuspid regurgitation     mild to moderate, echo, January, 2011, PA pressure 38 mm mercury  . 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 Hyponatremia   . Chronic cystitis   . Hemorrhoids   . Hx of colonoscopy   . IBS (irritable bowel syndrome)   . History of colon polyps   . Ejection fraction     EF 65%, echo, 2011  . Potassium (K) deficiency     5 potassium requirement over time  . Syncope     August, 200 weight, dehydration  . 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 CIN I (cervical intraepithelial neoplasia I)   . Osteoporosis   . PONV (postoperative nausea and vomiting)   . Heart murmur     MVP   . Orthostasis     Mild orthostatic change when she stands  . Basal cell carcinoma of nose     removed w/MOHs  . Osteoarthritis     "do not have RA" (06/17/2013)  . CAD (coronary artery disease)     a. NSTEMI 06/2013 => LHC:  mLAD 40, oD2 70 (small), pOM 20, mRCA 20, mid to dist ant HK, EF 30-35% (c/w Tako-Tsubo CM)  . 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 EF 30-35% at [facility] 06/2013;  b.  f/u Echo (06/18/13):  EF 60-65%, normal wall motion, Gr 1 DD, mild MVP of post leaflet, mod TR, PASP 63  . Atrial fibrillation   . Abnormal EKG     Past Surgical History  Procedure Laterality Date  . Excisional hemorrhoidectomy  2003  . Mohs surgery Left 2011    "side of my nose" (06/17/2013)  . Total knee arthroplasty  08/2010  . Colonoscopy  12/23/2003    normal (indications: prior adenomas and grandfather with colon cancer)  . Colposcopy    . Tonsillectomy    . Total knee arthroplasty  02/25/2012    Procedure: TOTAL KNEE ARTHROPLASTY;  Surgeon: Gearlean Alf, MD;  Location: [facility];  Service: Orthopedics;  Laterality: Right;  . Cardiac catheterization  06/17/2013    Patient  Active Problem List   Diagnosis Date Noted  . Dyslipidemia     Priority: High  . Ejection fraction     Priority: High  . Potassium (K) deficiency     Priority: High  . 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 Sinus  bradycardia 08/17/2013  . Anticoagulation adequate 07/20/2013  . Graves disease 06/26/2013  . Abnormal EKG   . Takotsubo syndrome 06/19/2013  . Atrial fibrillation 06/19/2013  . Orthostasis   . H/O dizziness 12/26/2012  . Abnormal LFTs 12/26/2012  . NSAID long-term use 12/05/2012  . Acute epigastric pain 12/05/2012  . CIN I (cervical intraepithelial neoplasia I)   . Ringing in ears 05/09/2011  . HTN (hypertension)   . Mitral valve prolapse   . Tricuspid regurgitation   . Subdural hematoma   . Syncope   . Personal history of colonic polyps 02/06/2011  . Personal history of failed moderate sedation 02/06/2011  . RHINITIS 08/23/2010  . ARTHRITIS, KNEE 08/23/2010  . SKIN CANCER, HX OF 02/24/2010  . FX CLSD SKL VLT W/HEM NEC, CONCUSSION NOS 04/07/2007  . POLYMYALGIA RHEUMATICA 04/02/2007  . OSTEOPOROSIS 03/11/2007    ROS  Patient denies fever, chills, headache, sweats, rash, change in vision, change in hearing, chest pain, cough, nausea or vomiting, urinary symptoms. All other systems are reviewed and are negative.  PHYSICAL EXAM Patient is oriented to person time and place. Affect is normal. Head is atraumatic. Sclera and conjunctiva are normal. There is no jugular venous distention. Lungs are clear. Respiratory effort is nonlabored. Cardiac exam reveals an S1 and S2. Abdomen is soft. There is no peripheral edema.  Filed Vitals:   07/14/14 1122  BP: 142/90  Pulse: 62  Height: 5' 4.5" (1.638 m)  Weight: 154 lb (69.854 kg)   EKG is done today and reviewed by me. There is mild sinus bradycardia. The QRS remains completely normalized.  ASSESSMENT & PLAN

## 2014-07-14 NOTE — Patient Instructions (Signed)
**Note De-Identified  Obfuscation** Your physician has recommended you make the following change in your medication: stop taking Eliquis  Your physician wants you to follow-up in: 6 months. You will receive a reminder letter in the mail two months in advance. If you don't receive a letter, please call our office to schedule the follow-up appointment.

## 2014-07-14 NOTE — Assessment & Plan Note (Signed)
She has been treated with radioactive iodine. She is now receiving Synthroid replacement.

## 2014-07-14 NOTE — Assessment & Plan Note (Signed)
She has recovered completely. No change in therapy.

## 2014-07-14 NOTE — Assessment & Plan Note (Signed)
We saw brief atrial fibrillation when she was hospitalized with Takotsubo, and she was hyperthyroid at the time. She has received radioactive iodine. We can now stop her anticoagulation.

## 2014-07-15 ENCOUNTER — Encounter (HOSPITAL_COMMUNITY): Payer: Self-pay | Admitting: Cardiology

## 2014-07-15 DIAGNOSIS — M4806 Spinal stenosis, lumbar region: Secondary | ICD-10-CM | POA: Diagnosis not present

## 2014-07-15 DIAGNOSIS — M4120 Other idiopathic scoliosis, site unspecified: Secondary | ICD-10-CM | POA: Diagnosis not present

## 2014-07-15 DIAGNOSIS — M5416 Radiculopathy, lumbar region: Secondary | ICD-10-CM | POA: Diagnosis not present

## 2014-07-16 ENCOUNTER — Encounter: Payer: Self-pay | Admitting: Cardiology

## 2014-07-20 ENCOUNTER — Telehealth: Payer: Self-pay | Admitting: Cardiology

## 2014-07-20 NOTE — Telephone Encounter (Signed)
I thought I responded to same question the other day?  I am not recommending aspirin daily for her. If she wants to take it for other reasons, this is fine with me.

## 2014-07-20 NOTE — Telephone Encounter (Signed)
New message     Pt is off eliquis. Should she start aspirin?

## 2014-07-20 NOTE — Telephone Encounter (Signed)
Patient is off of Eliquis. Wanting to know if she can take a baby ASA.

## 2014-07-20 NOTE — Telephone Encounter (Signed)
Left message for pt to call me back to discuss the asa

## 2014-07-21 DIAGNOSIS — M4120 Other idiopathic scoliosis, site unspecified: Secondary | ICD-10-CM | POA: Diagnosis not present

## 2014-07-21 DIAGNOSIS — M4316 Spondylolisthesis, lumbar region: Secondary | ICD-10-CM | POA: Diagnosis not present

## 2014-07-21 DIAGNOSIS — Z6826 Body mass index (BMI) 26.0-26.9, adult: Secondary | ICD-10-CM | POA: Diagnosis not present

## 2014-07-21 DIAGNOSIS — M4806 Spinal stenosis, lumbar region: Secondary | ICD-10-CM | POA: Diagnosis not present

## 2014-07-21 DIAGNOSIS — I10 Essential (primary) hypertension: Secondary | ICD-10-CM | POA: Diagnosis not present

## 2014-07-21 DIAGNOSIS — M5416 Radiculopathy, lumbar region: Secondary | ICD-10-CM | POA: Diagnosis not present

## 2014-07-22 NOTE — Telephone Encounter (Signed)
Pt aware Dr Ron Parker is not recommending daily asa for her.

## 2014-07-22 NOTE — Telephone Encounter (Signed)
Follow up  Pt returning Rn phone call. Please call back and discuss.  

## 2014-08-03 ENCOUNTER — Encounter: Payer: Self-pay | Admitting: Gynecology

## 2014-08-03 ENCOUNTER — Ambulatory Visit (INDEPENDENT_AMBULATORY_CARE_PROVIDER_SITE_OTHER): Payer: Medicare Other | Admitting: Gynecology

## 2014-08-03 VITALS — BP 128/80 | Ht 64.5 in | Wt 153.0 lb

## 2014-08-03 DIAGNOSIS — M810 Age-related osteoporosis without current pathological fracture: Secondary | ICD-10-CM | POA: Diagnosis not present

## 2014-08-03 DIAGNOSIS — N952 Postmenopausal atrophic vaginitis: Secondary | ICD-10-CM

## 2014-08-03 NOTE — Patient Instructions (Signed)
Mail [place] a copy of the last bone density.  You may obtain a copy of any labs that were done today by logging onto MyChart as outlined in the instructions provided with your AVS (after visit summary). The office will not call with normal lab results but certainly if there are any significant abnormalities then we will contact you.   Health Maintenance, Female A healthy lifestyle and preventative care can promote health and wellness.  Maintain regular health, dental, and eye exams.  Eat a healthy diet. Foods like vegetables, fruits, whole grains, low-fat dairy products, and lean protein foods contain the nutrients you need without too many calories. Decrease your intake of foods high in solid fats, added sugars, and salt. Get information about a proper diet from your caregiver, if necessary.  Regular physical exercise is one of the most important things you can do for your health. Most adults should get at least 150 minutes of moderate-intensity exercise (any activity that increases your heart rate and causes you to sweat) each week. In addition, most adults need muscle-strengthening exercises on 2 or more days a week.   Maintain a healthy weight. The body mass index (BMI) is a screening tool to identify possible weight problems. It provides an estimate of body fat based on height and weight. Your caregiver can help determine your BMI, and can help you achieve or maintain a healthy weight. For adults 20 years and older:  A BMI below 18.5 is considered underweight.  A BMI of 18.5 to 24.9 is normal.  A BMI of 25 to 29.9 is considered overweight.  A BMI of 30 and above is considered obese.  Maintain normal blood lipids and cholesterol by exercising and minimizing your intake of saturated fat. Eat a balanced diet with plenty of fruits and vegetables. Blood tests for lipids and cholesterol should begin at age 8 and be repeated every 5 years. If your lipid or cholesterol levels are high, you are over  50, or you are a high risk for heart disease, you may need your cholesterol levels checked more frequently.Ongoing high lipid and cholesterol levels should be treated with medicines if diet and exercise are not effective.  If you smoke, find out from your caregiver how to quit. If you do not use tobacco, do not start.  Lung cancer screening is recommended for adults aged 32 80 years who are at high risk for developing lung cancer because of a history of smoking. Yearly low-dose computed tomography (CT) is recommended for people who have at least a 30-pack-year history of smoking and are a current smoker or have quit within the past 15 years. A pack year of smoking is smoking an average of 1 pack of cigarettes a day for 1 year (for example: 1 pack a day for 30 years or 2 packs a day for 15 years). Yearly screening should continue until the smoker has stopped smoking for at least 15 years. Yearly screening should also be stopped for people who develop a health problem that would prevent them from having lung cancer treatment.  If you are pregnant, do not drink alcohol. If you are breastfeeding, be very cautious about drinking alcohol. If you are not pregnant and choose to drink alcohol, do not exceed 1 drink per day. One drink is considered to be 12 ounces (355 mL) of beer, 5 ounces (148 mL) of wine, or 1.5 ounces (44 mL) of liquor.  Avoid use of street drugs. Do not share needles with anyone. Ask  for help if you need support or instructions about stopping the use of drugs.  High blood pressure causes heart disease and increases the risk of stroke. Blood pressure should be checked at least every 1 to 2 years. Ongoing high blood pressure should be treated with medicines, if weight loss and exercise are not effective.  If you are 48 to 75 years old, ask your caregiver if you should take aspirin to prevent strokes.  Diabetes screening involves taking a blood sample to check your fasting blood sugar level.  This should be done once every 3 years, after age 30, if you are within normal weight and without risk factors for diabetes. Testing should be considered at a younger age or be carried out more frequently if you are overweight and have at least 1 risk factor for diabetes.  Breast cancer screening is essential preventative care for women. You should practice "breast self-awareness." This means understanding the normal appearance and feel of your breasts and may include breast self-examination. Any changes detected, no matter how small, should be reported to a caregiver. Women in their 56s and 30s should have a clinical breast exam (CBE) by a caregiver as part of a regular health exam every 1 to 3 years. After age 9, women should have a CBE every year. Starting at age 77, women should consider having a mammogram (breast X-ray) every year. Women who have a family history of breast cancer should talk to their caregiver about genetic screening. Women at a high risk of breast cancer should talk to their caregiver about having an MRI and a mammogram every year.  Breast cancer gene (BRCA)-related cancer risk assessment is recommended for women who have family members with BRCA-related cancers. BRCA-related cancers include breast, ovarian, tubal, and peritoneal cancers. Having family members with these cancers may be associated with an increased risk for harmful changes (mutations) in the breast cancer genes BRCA1 and BRCA2. Results of the assessment will determine the need for genetic counseling and BRCA1 and BRCA2 testing.  The Pap test is a screening test for cervical cancer. Women should have a Pap test starting at age 61. Between ages 16 and 70, Pap tests should be repeated every 2 years. Beginning at age 50, you should have a Pap test every 3 years as long as the past 3 Pap tests have been normal. If you had a hysterectomy for a problem that was not cancer or a condition that could lead to cancer, then you no  longer need Pap tests. If you are between ages 52 and 38, and you have had normal Pap tests going back 10 years, you no longer need Pap tests. If you have had past treatment for cervical cancer or a condition that could lead to cancer, you need Pap tests and screening for cancer for at least 20 years after your treatment. If Pap tests have been discontinued, risk factors (such as a new sexual partner) need to be reassessed to determine if screening should be resumed. Some women have medical problems that increase the chance of getting cervical cancer. In these cases, your caregiver may recommend more frequent screening and Pap tests.  The human papillomavirus (HPV) test is an additional test that may be used for cervical cancer screening. The HPV test looks for the virus that can cause the cell changes on the cervix. The cells collected during the Pap test can be tested for HPV. The HPV test could be used to screen women aged 30 years and  older, and should be used in women of any age who have unclear Pap test results. After the age of 56, women should have HPV testing at the same frequency as a Pap test.  Colorectal cancer can be detected and often prevented. Most routine colorectal cancer screening begins at the age of 8 and continues through age 19. However, your caregiver may recommend screening at an earlier age if you have risk factors for colon cancer. On a yearly basis, your caregiver may provide home test kits to check for hidden blood in the stool. Use of a small camera at the end of a tube, to directly examine the colon (sigmoidoscopy or colonoscopy), can detect the earliest forms of colorectal cancer. Talk to your caregiver about this at age 43, when routine screening begins. Direct examination of the colon should be repeated every 5 to 10 years through age 42, unless early forms of pre-cancerous polyps or small growths are found.  Hepatitis C blood testing is recommended for all people born from  [birth year] through 1967 and any individual with known risks for hepatitis C.  Practice safe sex. Use condoms and avoid high-risk sexual practices to reduce the spread of sexually transmitted infections (STIs). Sexually active women aged 88 and younger should be checked for Chlamydia, which is a common sexually transmitted infection. Older women with new or multiple partners should also be tested for Chlamydia. Testing for other STIs is recommended if you are sexually active and at increased risk.  Osteoporosis is a disease in which the bones lose minerals and strength with aging. This can result in serious bone fractures. The risk of osteoporosis can be identified using a bone density scan. Women ages 34 and over and women at risk for fractures or osteoporosis should discuss screening with their caregivers. Ask your caregiver whether you should be taking a calcium supplement or vitamin D to reduce the rate of osteoporosis.  Menopause can be associated with physical symptoms and risks. Hormone replacement therapy is available to decrease symptoms and risks. You should talk to your caregiver about whether hormone replacement therapy is right for you.  Use sunscreen. Apply sunscreen liberally and repeatedly throughout the day. You should seek shade when your shadow is shorter than you. Protect yourself by wearing long sleeves, pants, a wide-brimmed hat, and sunglasses year round, whenever you are outdoors.  Notify your caregiver of new moles or changes in moles, especially if there is a change in shape or color. Also notify your caregiver if a mole is larger than the size of a pencil eraser.  Stay current with your immunizations. Document Released: 02/05/2011 Document Revised: 11/17/2012 Document Reviewed: 02/05/2011 [facility] Patient Information 2014 [place].

## 2014-08-03 NOTE — Progress Notes (Signed)
[name] May 04, 1941 [id number]        73 y.o.  G3P3001 for follow up exam. Several issues noted below.  Past medical history,surgical history, problem list, medications, allergies, family history and social history were all reviewed and documented as reviewed in the EPIC chart.  ROS:  Performed with pertinent positives and negatives included in the history, assessment and plan.   Additional significant findings :  none   Exam: Programmer, multimedia Vitals:   08/03/14 1054  BP: 128/80  Height: 5' 4.5" (1.638 m)  Weight: 153 lb (69.4 kg)   General appearance:  Normal affect, orientation and appearance. Skin: Grossly normal HEENT: Without gross lesions.  No cervical or supraclavicular adenopathy. Thyroid normal.  Lungs:  Clear without wheezing, rales or rhonchi Cardiac: RR, without RMG Abdominal:  Soft, nontender, without masses, guarding, rebound, organomegaly or hernia Breasts:  Examined lying and sitting without masses, retractions, discharge or axillary adenopathy. Pelvic:  Ext/BUS/vagina with generalized atrophic changes  Cervix with atrophic changes  Uterus axial to anteverted, normal size, shape and contour, midline and mobile nontender   Adnexa  Without masses or tenderness    Anus and perineum  Normal   Rectovaginal  Normal sphincter tone without palpated masses or tenderness.    Assessment/Plan:  73 y.o. G79P3001 female for follow up exam.   1. Postmenopausal/atrophic genital changes. Without significant hot flashes, night sweats, vaginal dryness or any vaginal bleeding. Continue to monitor and report any vaginal bleeding. 2. Osteoporosis. Patient continues on Reclast per Dr. Arletha Pili recommendation. She saw him earlier this year and had a bone density in June per her history. Was told by him to continue on the Reclast that she arranges through our office. Patient states she is due in June and she will call us to help arrange this.  She'll continue see Dr. Marcelino Scot  in follow up of her bone health. 3. Pap smear 2012. No Pap smear done today. History of dysplasia close to 40 years ago with normal Pap smears since then. Options to stop screening issues over the age of 5 versus less frequent screening intervals reviewed. Patient's comfortable with stop screening. 4. Colonoscopy 2012. Repeat at their recommend interval. 5. Mammography coming due this January and she knows to call to schedule. SBE monthly reviewed. 6. Health maintenance. No routine blood work done as she reports this done at her primary physician's office. Follow up 1 year, sooner as needed.     Anastasio Auerbach MD, 11:21 AM 08/03/2014

## 2014-08-04 ENCOUNTER — Encounter: Payer: Self-pay | Admitting: Gynecology

## 2014-08-09 ENCOUNTER — Other Ambulatory Visit (INDEPENDENT_AMBULATORY_CARE_PROVIDER_SITE_OTHER): Payer: Medicare Other

## 2014-08-09 ENCOUNTER — Other Ambulatory Visit: Payer: Medicare Other

## 2014-08-09 ENCOUNTER — Other Ambulatory Visit: Payer: Self-pay | Admitting: *Deleted

## 2014-08-09 DIAGNOSIS — E890 Postprocedural hypothyroidism: Secondary | ICD-10-CM | POA: Diagnosis not present

## 2014-08-09 DIAGNOSIS — I5181 Takotsubo syndrome: Secondary | ICD-10-CM

## 2014-08-09 LAB — TSH: TSH: 3.79 u[IU]/mL (ref 0.35–4.50)

## 2014-08-09 LAB — T4, FREE: Free T4: 1.36 ng/dL (ref 0.60–1.60)

## 2014-08-09 MED ORDER — CARVEDILOL 12.5 MG PO TABS: ORAL_TABLET | ORAL | Status: DC

## 2014-08-09 MED ORDER — FUROSEMIDE 20 MG PO TABS: ORAL_TABLET | ORAL | Status: DC

## 2014-08-09 MED ORDER — ATORVASTATIN CALCIUM 10 MG PO TABS: ORAL_TABLET | ORAL | Status: DC

## 2014-08-09 MED ORDER — AMLODIPINE BESYLATE 5 MG PO TABS: 5.0000 mg | ORAL_TABLET | Freq: Every day | ORAL | Status: DC

## 2014-08-10 ENCOUNTER — Encounter: Payer: Self-pay | Admitting: Internal Medicine

## 2014-08-11 ENCOUNTER — Other Ambulatory Visit: Payer: Self-pay | Admitting: Internal Medicine

## 2014-08-11 MED ORDER — LEVOTHYROXINE SODIUM 75 MCG PO TABS: 75.0000 ug | ORAL_TABLET | Freq: Every day | ORAL | Status: DC

## 2014-08-12 DIAGNOSIS — L720 Epidermal cyst: Secondary | ICD-10-CM | POA: Diagnosis not present

## 2014-08-12 DIAGNOSIS — I788 Other diseases of capillaries: Secondary | ICD-10-CM | POA: Diagnosis not present

## 2014-08-12 DIAGNOSIS — L738 Other specified follicular disorders: Secondary | ICD-10-CM | POA: Diagnosis not present

## 2014-08-12 DIAGNOSIS — D2262 Melanocytic nevi of left upper limb, including shoulder: Secondary | ICD-10-CM | POA: Diagnosis not present

## 2014-08-12 DIAGNOSIS — Z419 Encounter for procedure for purposes other than remedying health state, unspecified: Secondary | ICD-10-CM | POA: Diagnosis not present

## 2014-08-12 DIAGNOSIS — L821 Other seborrheic keratosis: Secondary | ICD-10-CM | POA: Diagnosis not present

## 2014-08-12 DIAGNOSIS — Z85828 Personal history of other malignant neoplasm of skin: Secondary | ICD-10-CM | POA: Diagnosis not present

## 2014-08-13 DIAGNOSIS — Z803 Family history of malignant neoplasm of breast: Secondary | ICD-10-CM | POA: Diagnosis not present

## 2014-08-13 DIAGNOSIS — Z1231 Encounter for screening mammogram for malignant neoplasm of breast: Secondary | ICD-10-CM | POA: Diagnosis not present

## 2014-08-16 ENCOUNTER — Encounter: Payer: Self-pay | Admitting: Gynecology

## 2014-08-17 DIAGNOSIS — H40013 Open angle with borderline findings, low risk, bilateral: Secondary | ICD-10-CM | POA: Diagnosis not present

## 2014-10-21 DIAGNOSIS — M5416 Radiculopathy, lumbar region: Secondary | ICD-10-CM | POA: Diagnosis not present

## 2014-11-22 ENCOUNTER — Encounter: Payer: Self-pay | Admitting: Internal Medicine

## 2014-11-22 ENCOUNTER — Ambulatory Visit (INDEPENDENT_AMBULATORY_CARE_PROVIDER_SITE_OTHER): Payer: Medicare Other | Admitting: Internal Medicine

## 2014-11-22 VITALS — BP 124/80 | HR 65 | Temp 98.1°F | Resp 12 | Wt 158.2 lb

## 2014-11-22 DIAGNOSIS — E890 Postprocedural hypothyroidism: Secondary | ICD-10-CM

## 2014-11-22 DIAGNOSIS — E0500 Thyrotoxicosis with diffuse goiter without thyrotoxic crisis or storm: Secondary | ICD-10-CM | POA: Diagnosis not present

## 2014-11-22 LAB — T4, FREE: Free T4: 1.42 ng/dL (ref 0.60–1.60)

## 2014-11-22 LAB — TSH: TSH: 0.84 u[IU]/mL (ref 0.35–4.50)

## 2014-11-22 NOTE — Patient Instructions (Signed)
Please stop at the lab.  Please come back for a follow-up appointment in 6 months.  

## 2014-11-22 NOTE — Progress Notes (Signed)
Patient ID: [name], female   DOB: 1941/01/31, 74 y.o.   MRN: [id number]   HPI  [name] is a 74 y.o.-year-old female, returning for f/u for h/o dx of Graves ds (dx after last visit) and now postsurgical hypothyroidism. Last visit 3 mo ago.  Reviewed hx: Patient was admitted to the hospital 06/2013 with Takotsubo cardiomyopathy, and a low TSH was found incidentally. She had cardiac catheterization on 06/17/2013 (after blood for TSH was drawn). She was also found to have atrial fibrillation in the hospital, now resolved (was on Coreg in the past and continues now). The patient's EF also normalized before discharge.  She was scheduled to have a hospital followup appointment with her PCP. Dr. Regis Bill repeated a TSH level and added a free T4 and a free T3 and these returned abnormal, suggesting thyrotoxicosis. Of note, her pulse was in the 50s and she was not in atrial fibrillation at the time of the appointment. Patient was referred to endocrinology for for further management.  We obtained an Uptake and scan (12/28/2013): Markedly elevated 24 hr radio iodine uptake of 82% Normal thyroid scan. Findings consistent with Graves disease.  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  We started LT4 25 mcg daily >> increased to 50 mcg, then 75 mcg daily after last TSH check.  She is taking the LT4: - in am - eats b'fast >30 min later - no PPI - takes Ca with lunch and dinner - no MVI  I reviewed pt's thyroid tests: Lab Results  Component Value Date   TSH 3.79 08/09/2014   TSH 17.05* 07/05/2014   TSH 15.69* 05/13/2014   TSH 6.03* 04/15/2014   TSH 0.06* 03/04/2014   TSH 0.01* 12/01/2013   TSH 0.00* 10/26/2013   TSH 0.02* 09/28/2013   TSH 0.13* 08/25/2013   TSH 0.03* 06/26/2013   FREET4 1.36 08/09/2014   FREET4 0.96 07/05/2014   FREET4 0.85 05/13/2014   FREET4 1.23 04/15/2014   FREET4 1.77* 03/04/2014   FREET4 1.44 12/01/2013   FREET4 1.64* 10/26/2013   FREET4 1.96* 09/28/2013   FREET4 0.48* 08/25/2013   FREET4 2.38* 06/26/2013  fT3 on 06/26/2013: 6.4 (2.3 - 4.2)   Pt c/o: - + weight gain: 4 lbs since last visit - no cold intolerance - no fatigue - no constipation - no dry skin  Pt denies feeling nodules in neck, hoarseness, dysphagia/odynophagia, SOB with lying down.  She also has a h/o hypertension, hyperlipidemia, tricuspid regurgitation, mitral valve prolapse, PMR.  ROS: Constitutional: see HPI Eyes: no blurry vision, no xerophthalmia ENT: no sore throat, no nodules palpated in throat, no dysphagia/odynophagia, no hoarseness, + tinnitus R ear Cardiovascular: no CP/SOB/palpitations/leg swelling Respiratory: no cough/SOB Gastrointestinal: no N/V/D/C Musculoskeletal: no muscle/joint aches Skin: no rashes Neurological: no tremors/numbness/tingling/dizziness  I reviewed pt's medications, allergies, PMH, social hx, family hx, and changes were documented in the history of present illness. Otherwise, unchanged from my initial visit note. She just stopped Eliquis.   PE: BP 124/80 mmHg  Pulse 65  Temp(Src) 98.1 F (36.7 C) (Oral)  Resp 12  Wt 158 lb 3.2 oz (71.759 kg)  SpO2 97% Body mass index is 26.75 kg/(m^2). Wt Readings from Last 3 Encounters:  11/22/14 158 lb 3.2 oz (71.759 kg)  08/03/14 153 lb (69.4 kg)  07/14/14 154 lb (69.854 kg)  Body mass index is 26.75 kg/(m^2).  Constitutional: normal weight, in NAD Eyes: PERRLA, EOMI, no exophthalmos ENT: moist mucous membranes,  no thyromegaly, no cervical lymphadenopathy Cardiovascular: RRR, No MRG Respiratory: CTA B Gastrointestinal: abdomen soft, NT, ND, BS+ Musculoskeletal: no deformities, strength intact in all 4 Skin: moist, warm, no rashes Neurological: no tremor with outstretched hands, could not perform DTR - patient with TKR in both knees   ASSESSMENT: 1. H/o Graves ds  2. Postablative hypothyroidism  PLAN:  1. Patient with Graves  ds., s/p RAI tx 10 mo ago, now with postablative hypothyroidism.  - I will recheck her TFTs today - she is on a beta blocker >> continue Coreg - per cardiology - discussed about the fact that her chances of Graves recurrence are greatly reduced 2/2 her becoming hypothyroid post RAI tx  2. Postablative hypothyroidism - will check TFTs today - I advised her to take the LT4 every day, with water, >30 minutes before breakfast, separated by >4 hours from acid reflux medications, calcium, iron, multivitamins. She is taking the LT4 correctly. - we discussed the difference b/w generic and brand name LT4 - will stay with generic for now - RTC in 6 months   Office Visit on 11/22/2014  Component Date Value Ref Range Status  . TSH 11/22/2014 0.84  0.35 - 4.50 uIU/mL Final  . Free T4 11/22/2014 1.42  0.60 - 1.60 ng/dL Final  Thyroid tests normal, continue current dose of levothyroxine, 75 g daily.

## 2014-11-25 DIAGNOSIS — Z6826 Body mass index (BMI) 26.0-26.9, adult: Secondary | ICD-10-CM | POA: Diagnosis not present

## 2014-11-25 DIAGNOSIS — M4120 Other idiopathic scoliosis, site unspecified: Secondary | ICD-10-CM | POA: Diagnosis not present

## 2014-11-25 DIAGNOSIS — M4806 Spinal stenosis, lumbar region: Secondary | ICD-10-CM | POA: Diagnosis not present

## 2014-11-25 DIAGNOSIS — M4316 Spondylolisthesis, lumbar region: Secondary | ICD-10-CM | POA: Diagnosis not present

## 2014-11-25 DIAGNOSIS — M5416 Radiculopathy, lumbar region: Secondary | ICD-10-CM | POA: Diagnosis not present

## 2014-12-01 DIAGNOSIS — H9311 Tinnitus, right ear: Secondary | ICD-10-CM | POA: Diagnosis not present

## 2014-12-06 DIAGNOSIS — H9193 Unspecified hearing loss, bilateral: Secondary | ICD-10-CM | POA: Diagnosis not present

## 2014-12-06 DIAGNOSIS — H903 Sensorineural hearing loss, bilateral: Secondary | ICD-10-CM | POA: Diagnosis not present

## 2014-12-06 DIAGNOSIS — H9311 Tinnitus, right ear: Secondary | ICD-10-CM | POA: Diagnosis not present

## 2014-12-08 ENCOUNTER — Other Ambulatory Visit: Payer: Self-pay | Admitting: Cardiology

## 2015-01-05 ENCOUNTER — Ambulatory Visit (INDEPENDENT_AMBULATORY_CARE_PROVIDER_SITE_OTHER): Payer: Medicare Other | Admitting: Cardiology

## 2015-01-05 ENCOUNTER — Encounter: Payer: Self-pay | Admitting: Cardiology

## 2015-01-05 VITALS — BP 112/78 | HR 65 | Ht 64.5 in | Wt 157.6 lb

## 2015-01-05 DIAGNOSIS — I5181 Takotsubo syndrome: Secondary | ICD-10-CM | POA: Diagnosis not present

## 2015-01-05 DIAGNOSIS — I480 Paroxysmal atrial fibrillation: Secondary | ICD-10-CM | POA: Diagnosis not present

## 2015-01-05 DIAGNOSIS — E785 Hyperlipidemia, unspecified: Secondary | ICD-10-CM | POA: Diagnosis not present

## 2015-01-05 DIAGNOSIS — E0500 Thyrotoxicosis with diffuse goiter without thyrotoxic crisis or storm: Secondary | ICD-10-CM

## 2015-01-05 NOTE — Assessment & Plan Note (Signed)
TSH has been normalized after she was treated with radioactive iodine. She is stable.

## 2015-01-05 NOTE — Assessment & Plan Note (Signed)
Patient was hospitalized in November, 2014. Catheterization revealed no significant coronary disease. Wall motion abnormalities suggested Takot-subo. Her LV function normalized very rapidly within a few days. However her EKG was quite abnormal and remained quite abnormal for several months. During this. Her diagnosis of hyperthyroidism was made. It was quite unusual that she was bradycardic at that time. All of these issues are now resolved.

## 2015-01-05 NOTE — Progress Notes (Signed)
Cardiology Office Note   Date:  01/05/2015   ID:  [name], [name] November 25, 1940, MRN [id number]  PCP:  Horatio Pel, MD  Cardiologist:  Dola Argyle, MD   Chief Complaint  Patient presents with  . Appointment    Follow-up Takot-subo episode.      History of Present Illness: [name] is a 74 y.o. female who presents for follow-up of her cardiac status. She had an episode of Takot-subo cardiomyopathy. Around the same time she was diagnosed as being hyperthyroid. The presentation was very unusual as she was bradycardic. Over time, LV function normalized. Her hyperthyroid has been treated with radioactive iodine and her thyroid status has now been stabilized. She had brief atrial fibrillation during some of this but we felt it was due to her hyperthyroidism. After her thyroid was stabilized her anticoagulation was stopped. She's also had some problems with orthostatic hypotension over time. It appears however that all of her symptoms have stabilized.  The patient is aware that I will be retiring at the end of September, 2016. She would like to follow-up with Dr. Ena Dawley.  Past Medical History  Diagnosis Date  . Dyslipidemia   . Polymyalgia rheumatica   . HTN (hypertension)   . Mitral valve prolapse     echo, January, 2011, moderate prolapse with your leaflet, trivial MR   Antibiotic required for procedures  . Tricuspid regurgitation     mild to moderate, echo, January, 2011, PA pressure 38 mm mercury  . 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 Hyponatremia   . Chronic cystitis   . Hemorrhoids   . Hx of colonoscopy   . IBS (irritable bowel syndrome)   . History of colon polyps   . Ejection fraction     EF 65%, echo, 2011  . Potassium (K) deficiency     5 potassium requirement over time  . Syncope     August, 200 weight, dehydration  . 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  CIN I (cervical intraepithelial neoplasia I)   . Osteoporosis 01/2014    T score -2.3 followed by Dr. Marcelino Scot  . PONV (postoperative nausea and vomiting)   . Heart murmur     MVP   . Orthostasis     Mild orthostatic change when she stands  . Basal cell carcinoma of nose     removed w/MOHs  . Osteoarthritis     "do not have RA" (06/17/2013)  . CAD (coronary artery disease)     a. NSTEMI 06/2013 => LHC:  mLAD 40, oD2 70 (small), pOM 20, mRCA 20, mid to dist ant HK, EF 30-35% (c/w Tako-Tsubo CM)  . 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 EF 30-35% at [facility] 06/2013;  b.  f/u Echo (06/18/13):  EF 60-65%, normal wall motion, Gr 1 DD, mild MVP of post leaflet, mod TR, PASP 63  . Atrial fibrillation   . Abnormal EKG     Past Surgical History  Procedure Laterality Date  . Excisional hemorrhoidectomy  2003  . Mohs surgery Left 2011    "side of my nose" (06/17/2013)  . Total knee arthroplasty  08/2010  . Colonoscopy  12/23/2003    normal (indications: prior adenomas and grandfather with colon cancer)  . Colposcopy    . Tonsillectomy    . Total knee arthroplasty  02/25/2012    Procedure: TOTAL KNEE ARTHROPLASTY;  Surgeon: Gearlean Alf, MD;  Location: [facility] ORS;  Service: Orthopedics;  Laterality: Right;  . Cardiac catheterization  06/17/2013  . Left heart catheterization with coronary angiogram N/A 06/17/2013    Procedure: LEFT HEART CATHETERIZATION WITH CORONARY ANGIOGRAM;  Surgeon: Peter M Martinique, MD;  Location: [facility] CATH LAB;  Service: Cardiovascular;  Laterality: N/A;    Patient Active Problem List   Diagnosis Date Noted  . Dyslipidemia     Priority: High  . Ejection fraction     Priority: High  . Potassium (K) deficiency     Priority: High  . 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 Sinus bradycardia 08/17/2013  . Anticoagulation adequate 07/20/2013  . Graves disease 06/26/2013  . Abnormal EKG   . Takotsubo syndrome 06/19/2013  . Atrial fibrillation 06/19/2013  . Orthostasis     . H/O dizziness 12/26/2012  . Abnormal LFTs 12/26/2012  . NSAID long-term use 12/05/2012  . Acute epigastric pain 12/05/2012  . CIN I (cervical intraepithelial neoplasia I)   . Ringing in ears 05/09/2011  . HTN (hypertension)   . Mitral valve prolapse   . Tricuspid regurgitation   . Subdural hematoma   . Syncope   . Personal history of colonic polyps 02/06/2011  . Personal history of failed moderate sedation 02/06/2011  . RHINITIS 08/23/2010  . ARTHRITIS, KNEE 08/23/2010  . SKIN CANCER, HX OF 02/24/2010  . FX CLSD SKL VLT W/HEM NEC, CONCUSSION NOS 04/07/2007  . POLYMYALGIA RHEUMATICA 04/02/2007  . OSTEOPOROSIS 03/11/2007      Current Outpatient Prescriptions  Medication Sig Dispense Refill  . amitriptyline (ELAVIL) 25 MG tablet Take 25 mg by mouth at bedtime.     Marland Kitchen amLODipine (NORVASC) 5 MG tablet Take 1 tablet (5 mg total) by mouth daily with supper. 90 tablet 1  . atorvastatin (LIPITOR) 10 MG tablet TAKE 1 TABLET DAILY WITH   BREAKFAST 90 tablet 1  . Biotin 5000 MCG CAPS Take 1 capsule by mouth daily.     . Calcium Carbonate (CALTRATE 600 PO) Take 1 tablet by mouth daily.     . carvedilol (COREG) 12.5 MG tablet TAKE 1 TABLET TWICE A DAY  WITH A MEAL 180 tablet 1  . Cholecalciferol (D3-1000) 1000 UNITS capsule Take 1,000 Units by mouth daily.    . clindamycin (CLEOCIN T) 1 % lotion Apply 1 application topically daily.    . enalapril (VASOTEC) 20 MG tablet TAKE 1 TABLET TWICE A DAY 180 tablet 0  . EVENING PRIMROSE OIL PO Take 1 capsule by mouth daily.     . furosemide (LASIX) 20 MG tablet TAKE 1 TABLET DAILY WITH   BREAKFAST 90 tablet 1  . levothyroxine (SYNTHROID, LEVOTHROID) 75 MCG tablet Take 1 tablet (75 mcg total) by mouth daily. 90 tablet 1  . meloxicam (MOBIC) 15 MG tablet Take 15 mg by mouth daily.    . methylcellulose packet Take 1 each by mouth daily. CITRICEL    . nitroGLYCERIN (NITROSTAT) 0.4 MG SL tablet Place 1 tablet (0.4 mg total) under the tongue every 5  (five) minutes as needed for chest pain. 25 tablet 12  . potassium chloride SA (KLOR-CON M20) 20 MEQ tablet TAKE 2 TABLETS EVERY MORNING, TAKE 2 TABLETS AT LUNCH AND TAKE 1 TABLET AT DINNER 450 tablet 3   No current facility-administered medications for this visit.    Allergies:   Codeine    Social History:  The patient  reports that she has never smoked. She has never used smokeless tobacco. She  reports that she does not drink alcohol or use illicit drugs.   Family History:  The patient's family history includes Breast cancer in her paternal aunt; Colon cancer in her maternal grandfather; Coronary artery disease in her father; Esophageal cancer in her paternal grandmother; Heart attack (age of onset: 51) in her mother; Heart attack (age of onset: 35) in her father; Heart disease in her father, maternal grandfather, mother, paternal grandfather, and sister; Hypertension in her father, mother, and sister; Irritable bowel syndrome in her sister; Stroke in her mother.    ROS:  Please see the history of present illness.     Patient denies fever, chills, headache, sweats, rash, change in vision, change in hearing, chest pain, cough, nausea or vomiting, urinary symptoms. All other systems are reviewed and are negative.  PHYSICAL EXAM: VS:  BP 112/78 mmHg  Pulse 65  Ht 5' 4.5" (1.638 m)  Wt 157 lb 9.6 oz (71.487 kg)  BMI 26.64 kg/m2  SpO2 97% , patient is oriented to person time and place. Affect is normal. Head is atraumatic. Sclera and conjunctiva are normal. There is no jugular venous distention. Lungs are clear. Respiratory effort is not labored. Cardiac exam reveals S1 and S2. The abdomen is soft. There is no peripheral edema. There are no musculoskeletal deformities. There are no skin rashes. Neurologic is grossly intact.  EKG:   EKG was not done today.   Recent Labs: 01/19/2014: ALT 17; BUN 18; Creatinine 0.8; Potassium 4.0; Sodium 138 11/22/2014: TSH 0.84    Lipid Panel    Component  Value Date/Time   CHOL 130 01/19/2014 0910   TRIG 73.0 01/19/2014 0910   TRIG 69 05/27/2006 0928   HDL 72.60 01/19/2014 0910   CHOLHDL 2 01/19/2014 0910   CHOLHDL 2.2 CALC 05/27/2006 0928   VLDL 14.6 01/19/2014 0910   LDLCALC 43 01/19/2014 0910      Wt Readings from Last 3 Encounters:  01/05/15 157 lb 9.6 oz (71.487 kg)  11/22/14 158 lb 3.2 oz (71.759 kg)  08/03/14 153 lb (69.4 kg)      Current medicines are reviewed  The patient understands her medications.     ASSESSMENT AND PLAN:

## 2015-01-05 NOTE — Patient Instructions (Signed)
**Note De-Identified  Obfuscation** Medication Instructions:  Same-no changes  Labwork: Lipids, BMET and CBCD (Please do not eat or drink after midnight the night before labs are to be drawn)  Testing/Procedures: None  Follow-Up: Your physician wants you to follow-up in: 6 months with Dr Meda Coffee. You will receive a reminder letter in the mail two months in advance. If you don't receive a letter, please call our office to schedule the follow-up appointment.

## 2015-01-05 NOTE — Assessment & Plan Note (Signed)
She had limited atrial fibrillation while she was hyperthyroid. She is euthyroid now. There is no need for anticoagulation any further and it has been stopped.

## 2015-01-06 ENCOUNTER — Telehealth: Payer: Self-pay | Admitting: Cardiology

## 2015-01-06 ENCOUNTER — Other Ambulatory Visit: Payer: Self-pay | Admitting: Internal Medicine

## 2015-01-06 MED ORDER — ATORVASTATIN CALCIUM 10 MG PO TABS: ORAL_TABLET | ORAL | Status: DC

## 2015-01-06 MED ORDER — FUROSEMIDE 20 MG PO TABS: ORAL_TABLET | ORAL | Status: DC

## 2015-01-06 NOTE — Telephone Encounter (Signed)
**Note De-Identified  Obfuscation** Lipitor and Furosemide refills sent to [facility] to fill.

## 2015-01-06 NOTE — Telephone Encounter (Signed)
Pt c/o medication issue:  New Refills. Pt states that she was advised to have this message sent directly to the nurse and to not go through the pharmacy/ refills dept.   Refills for atorvastatin (LIPITOR) 10 MG tablet and furosemide (LASIX) 20 MG tablet

## 2015-01-10 DIAGNOSIS — M545 Low back pain: Secondary | ICD-10-CM | POA: Diagnosis not present

## 2015-01-10 DIAGNOSIS — E559 Vitamin D deficiency, unspecified: Secondary | ICD-10-CM | POA: Diagnosis not present

## 2015-01-10 DIAGNOSIS — M810 Age-related osteoporosis without current pathological fracture: Secondary | ICD-10-CM | POA: Diagnosis not present

## 2015-01-10 DIAGNOSIS — Z791 Long term (current) use of non-steroidal anti-inflammatories (NSAID): Secondary | ICD-10-CM | POA: Diagnosis not present

## 2015-01-11 ENCOUNTER — Telehealth: Payer: Self-pay | Admitting: Internal Medicine

## 2015-01-11 MED ORDER — LEVOTHYROXINE SODIUM 75 MCG PO TABS: 75.0000 ug | ORAL_TABLET | Freq: Every day | ORAL | Status: DC

## 2015-01-11 NOTE — Telephone Encounter (Signed)
Pt called and needs refill on lavothyroxine for 90 days to [facility]

## 2015-01-13 ENCOUNTER — Other Ambulatory Visit (INDEPENDENT_AMBULATORY_CARE_PROVIDER_SITE_OTHER): Payer: Medicare Other

## 2015-01-13 DIAGNOSIS — E785 Hyperlipidemia, unspecified: Secondary | ICD-10-CM | POA: Diagnosis not present

## 2015-01-13 DIAGNOSIS — I480 Paroxysmal atrial fibrillation: Secondary | ICD-10-CM

## 2015-01-13 LAB — CBC WITH DIFFERENTIAL/PLATELET
Basophils Absolute: 0.1 10*3/uL (ref 0.0–0.1)
Basophils Relative: 1 % (ref 0.0–3.0)
Eosinophils Absolute: 0.2 10*3/uL (ref 0.0–0.7)
Eosinophils Relative: 2.7 % (ref 0.0–5.0)
HCT: 43.5 % (ref 36.0–46.0)
Hemoglobin: 14.3 g/dL (ref 12.0–15.0)
Lymphocytes Relative: 43.3 % (ref 12.0–46.0)
Lymphs Abs: 2.5 10*3/uL (ref 0.7–4.0)
MCHC: 32.8 g/dL (ref 30.0–36.0)
MCV: 89 fl (ref 78.0–100.0)
Monocytes Absolute: 0.4 10*3/uL (ref 0.1–1.0)
Monocytes Relative: 7.5 % (ref 3.0–12.0)
Neutro Abs: 2.7 10*3/uL (ref 1.4–7.7)
Neutrophils Relative %: 45.5 % (ref 43.0–77.0)
Platelets: 218 10*3/uL (ref 150.0–400.0)
RBC: 4.89 Mil/uL (ref 3.87–5.11)
RDW: 13.2 % (ref 11.5–15.5)
WBC: 5.9 10*3/uL (ref 4.0–10.5)

## 2015-01-13 LAB — LIPID PANEL
Cholesterol: 161 mg/dL (ref 0–200)
HDL: 74.7 mg/dL (ref >39.00)
LDL Cholesterol: 75 mg/dL (ref 0–99)
NonHDL: 86.3
Total CHOL/HDL Ratio: 2
Triglycerides: 58 mg/dL (ref 0.0–149.0)
VLDL: 11.6 mg/dL (ref 0.0–40.0)

## 2015-01-13 LAB — BASIC METABOLIC PANEL
BUN: 21 mg/dL (ref 6–23)
CO2: 28 mEq/L (ref 19–32)
Calcium: 9.5 mg/dL (ref 8.4–10.5)
Chloride: 105 mEq/L (ref 96–112)
Creatinine, Ser: 0.99 mg/dL (ref 0.40–1.20)
GFR: 58.03 mL/min — ABNORMAL LOW (ref >60.00)
Glucose, Bld: 92 mg/dL (ref 70–99)
Potassium: 4.1 mEq/L (ref 3.5–5.1)
Sodium: 138 mEq/L (ref 135–145)

## 2015-01-17 ENCOUNTER — Telehealth: Payer: Self-pay | Admitting: Cardiology

## 2015-01-17 ENCOUNTER — Telehealth: Payer: Self-pay

## 2015-01-17 NOTE — Telephone Encounter (Signed)
Pt returned call

## 2015-01-17 NOTE — Telephone Encounter (Signed)
The pt states that she has been having ringing in her right ear for about a month. She states that she read that Furosemide has a side effect of ringing of the ears. The pt has been taking Furosemide for about 30 years without complaint. She wants to know what to do.

## 2015-01-17 NOTE — Telephone Encounter (Signed)
The pt is advised of her recent lab results and she verbalized understanding.

## 2015-01-19 NOTE — Telephone Encounter (Signed)
I feel it is very unlikely that ringing in her ears is related to her Lasix dose. I would recommend no change from the cardiology viewpoint. She should check with her primary physician to see if any further evaluation is indicated

## 2015-01-20 NOTE — Telephone Encounter (Signed)
**Note De-identified  Obfuscation** The pt is advised and she verbalized understanding. 

## 2015-01-31 ENCOUNTER — Other Ambulatory Visit: Payer: Self-pay

## 2015-02-09 ENCOUNTER — Other Ambulatory Visit: Payer: Self-pay

## 2015-02-09 DIAGNOSIS — I5181 Takotsubo syndrome: Secondary | ICD-10-CM

## 2015-02-09 MED ORDER — AMLODIPINE BESYLATE 5 MG PO TABS: 5.0000 mg | ORAL_TABLET | Freq: Every day | ORAL | Status: DC

## 2015-02-10 ENCOUNTER — Other Ambulatory Visit: Payer: Self-pay | Admitting: *Deleted

## 2015-02-10 DIAGNOSIS — L720 Epidermal cyst: Secondary | ICD-10-CM | POA: Diagnosis not present

## 2015-02-10 DIAGNOSIS — D2262 Melanocytic nevi of left upper limb, including shoulder: Secondary | ICD-10-CM | POA: Diagnosis not present

## 2015-02-10 DIAGNOSIS — L700 Acne vulgaris: Secondary | ICD-10-CM | POA: Diagnosis not present

## 2015-02-10 DIAGNOSIS — Z85828 Personal history of other malignant neoplasm of skin: Secondary | ICD-10-CM | POA: Diagnosis not present

## 2015-02-10 MED ORDER — ENALAPRIL MALEATE 20 MG PO TABS: 20.0000 mg | ORAL_TABLET | Freq: Two times a day (BID) | ORAL | Status: DC

## 2015-02-10 MED ORDER — POTASSIUM CHLORIDE CRYS ER 20 MEQ PO TBCR: EXTENDED_RELEASE_TABLET | ORAL | Status: DC

## 2015-02-10 NOTE — Telephone Encounter (Signed)
Refill completed.

## 2015-02-14 DIAGNOSIS — H2513 Age-related nuclear cataract, bilateral: Secondary | ICD-10-CM | POA: Diagnosis not present

## 2015-02-14 DIAGNOSIS — H40013 Open angle with borderline findings, low risk, bilateral: Secondary | ICD-10-CM | POA: Diagnosis not present

## 2015-05-23 ENCOUNTER — Ambulatory Visit (INDEPENDENT_AMBULATORY_CARE_PROVIDER_SITE_OTHER): Payer: Medicare Other | Admitting: Internal Medicine

## 2015-05-23 ENCOUNTER — Encounter: Payer: Self-pay | Admitting: Internal Medicine

## 2015-05-23 VITALS — BP 104/62 | HR 58 | Temp 97.8°F | Resp 12 | Wt 156.8 lb

## 2015-05-23 DIAGNOSIS — Z8639 Personal history of other endocrine, nutritional and metabolic disease: Secondary | ICD-10-CM | POA: Diagnosis not present

## 2015-05-23 DIAGNOSIS — E890 Postprocedural hypothyroidism: Secondary | ICD-10-CM | POA: Diagnosis not present

## 2015-05-23 LAB — TSH: TSH: 0.63 u[IU]/mL (ref 0.35–4.50)

## 2015-05-23 LAB — T4, FREE: Free T4: 1.66 ng/dL — ABNORMAL HIGH (ref 0.60–1.60)

## 2015-05-23 LAB — T3, FREE: T3, Free: 3.2 pg/mL (ref 2.3–4.2)

## 2015-05-23 NOTE — Patient Instructions (Signed)
Please stop at the lab.  Continue Levothyroxine 75 mcg daily.  Do not take the Biotin before coming for labs.  Please come back for a follow-up appointment in 6 months.

## 2015-05-23 NOTE — Progress Notes (Signed)
Patient ID: [name], female   DOB: 01-04-1941, 74 y.o.   MRN: [id number]   HPI  [name] is a 74 y.o.-year-old female, returning for f/u for h/o dx of Graves ds (dx after last visit) and now postsurgical hypothyroidism. Last visit 6 mo ago.  Reviewed hx: Patient was admitted to the hospital 06/2013 with Takotsubo cardiomyopathy, and a low TSH was found incidentally. She had cardiac catheterization on 06/17/2013 (after blood for TSH was drawn). She was also found to have atrial fibrillation in the hospital, now resolved (was on Coreg in the past and continues now). The patient's EF also normalized before discharge.  She was scheduled to have a hospital followup appointment with her PCP. Dr. Regis Bill repeated a TSH level and added a free T4 and a free T3 and these returned abnormal, suggesting thyrotoxicosis. Of note, her pulse was in the 50s and she was not in atrial fibrillation at the time of the appointment. Patient was referred to endocrinology for for further management.  We obtained an Uptake and scan (12/28/2013): Markedly elevated 24 hr radio iodine uptake of 82% Normal thyroid scan. Findings consistent with Graves disease.  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  We started LT4 25 mcg daily >> increased to 50 mcg, then 75 mcg daily after last TSH check.  She is taking the LT4: - in am - eats b'fast >30 min later - no PPI - takes Ca with lunch and dinner - no MVI  I reviewed pt's thyroid tests: Lab Results  Component Value Date   TSH 0.84 11/22/2014   TSH 3.79 08/09/2014   TSH 17.05* 07/05/2014   TSH 15.69* 05/13/2014   TSH 6.03* 04/15/2014   TSH 0.06* 03/04/2014   TSH 0.01* 12/01/2013   TSH 0.00* 10/26/2013   TSH 0.02* 09/28/2013   TSH 0.13* 08/25/2013   FREET4 1.42 11/22/2014   FREET4 1.36 08/09/2014   FREET4 0.96 07/05/2014   FREET4 0.85 05/13/2014   FREET4 1.23 04/15/2014   FREET4 1.77* 03/04/2014   FREET4 1.44 12/01/2013   FREET4 1.64* 10/26/2013   FREET4 1.96* 09/28/2013   FREET4 0.48* 08/25/2013  fT3 on 06/26/2013: 6.4 (2.3 - 4.2)   Pt c/o: - no weight gain/loss - no cold intolerance - no fatigue - no constipation - no dry skin  Pt denies feeling nodules in neck, hoarseness, dysphagia/odynophagia, SOB with lying down.  She also has a h/o hypertension, hyperlipidemia, tricuspid regurgitation, mitral valve prolapse, PMR.  ROS: Constitutional: see HPI Eyes: no blurry vision, no xerophthalmia ENT: no sore throat, no nodules palpated in throat, no dysphagia/odynophagia, no hoarseness, + tinnitus R ear Cardiovascular: no CP/SOB/palpitations/leg swelling Respiratory: no cough/SOB Gastrointestinal: no N/V/D/C Musculoskeletal: no muscle/joint aches Skin: no rashes Neurological: no tremors/numbness/tingling/dizziness  I reviewed pt's medications, allergies, PMH, social hx, family hx, and changes were documented in the history of present illness. Otherwise, unchanged from my initial visit note.   PE: BP 104/62 mmHg  Pulse 58  Temp(Src) 97.8 F (36.6 C) (Oral)  Resp 12  Wt 156 lb 12.8 oz (71.124 kg)  SpO2 96% Body mass index is 26.51 kg/(m^2). Wt Readings from Last 3 Encounters:  05/23/15 156 lb 12.8 oz (71.124 kg)  01/05/15 157 lb 9.6 oz (71.487 kg)  11/22/14 158 lb 3.2 oz (71.759 kg)  Body mass index is 26.51 kg/(m^2).  Constitutional: normal weight, in NAD Eyes: PERRLA, EOMI, no exophthalmos ENT: moist mucous membranes, no thyromegaly, no cervical lymphadenopathy  Cardiovascular: RRR, No MRG Respiratory: CTA B Gastrointestinal: abdomen soft, NT, ND, BS+ Musculoskeletal: no deformities, strength intact in all 4 Skin: moist, warm, no rashes Neurological: no tremor with outstretched hands, could not perform DTR - patient with TKR in both knees   ASSESSMENT: 1. H/o Graves ds  2. Postablative hypothyroidism  PLAN:  1. Patient with Graves ds., s/p RAI tx, now with  postablative hypothyroidism.  - I will recheck her TFTs today - she is on a beta blocker >> continue Coreg - per cardiology - her chances of Graves recurrence are greatly reduced 2/2 her becoming hypothyroid post RAI tx - We discussed about Graves ophthalmopathy, and the fact that we cannot anticipate this complication, but she should let me know if she develops exophthalmos or pain with eye movements.  2. Postablative hypothyroidism - will check TFTs today, although she biotin this morning. I advised her to not take biotin the day she comes for labs in the future. - I advised her to take the LT4 every day, with water, >30 minutes before breakfast, separated by >4 hours from acid reflux medications, calcium, iron, multivitamins. She is taking the LT4 correctly. - RTC in 6 months   Needs refills when labs are back.  Component     Latest Ref Rng 05/23/2015  TSH     0.35 - 4.50 uIU/mL 0.63  T3, Free     2.3 - 4.2 pg/mL 3.2  Free T4     0.60 - 1.60 ng/dL 1.66 (H)  TSH is normal, but free T4 is slightly high, likely due to levothyroxine dosing before blood draw. I would like to repeat her thyroid tests in 3 months, rather than 6, since TSH is lower than before, and free T4 is slightly high.

## 2015-05-24 ENCOUNTER — Other Ambulatory Visit: Payer: Self-pay | Admitting: Internal Medicine

## 2015-05-24 ENCOUNTER — Ambulatory Visit: Payer: PRIVATE HEALTH INSURANCE | Admitting: Internal Medicine

## 2015-05-24 DIAGNOSIS — E890 Postprocedural hypothyroidism: Secondary | ICD-10-CM

## 2015-05-24 MED ORDER — LEVOTHYROXINE SODIUM 75 MCG PO TABS: 75.0000 ug | ORAL_TABLET | Freq: Every day | ORAL | Status: DC

## 2015-05-30 DIAGNOSIS — Z23 Encounter for immunization: Secondary | ICD-10-CM | POA: Diagnosis not present

## 2015-05-30 DIAGNOSIS — Z6826 Body mass index (BMI) 26.0-26.9, adult: Secondary | ICD-10-CM | POA: Diagnosis not present

## 2015-05-30 DIAGNOSIS — I10 Essential (primary) hypertension: Secondary | ICD-10-CM | POA: Diagnosis not present

## 2015-05-30 DIAGNOSIS — M159 Polyosteoarthritis, unspecified: Secondary | ICD-10-CM | POA: Diagnosis not present

## 2015-05-30 DIAGNOSIS — I5181 Takotsubo syndrome: Secondary | ICD-10-CM | POA: Diagnosis not present

## 2015-06-14 DIAGNOSIS — I10 Essential (primary) hypertension: Secondary | ICD-10-CM | POA: Diagnosis not present

## 2015-07-10 ENCOUNTER — Other Ambulatory Visit: Payer: Self-pay | Admitting: Cardiology

## 2015-07-11 ENCOUNTER — Other Ambulatory Visit: Payer: Self-pay | Admitting: *Deleted

## 2015-07-11 MED ORDER — CARVEDILOL 12.5 MG PO TABS: ORAL_TABLET | ORAL | Status: DC

## 2015-07-16 ENCOUNTER — Other Ambulatory Visit: Payer: Self-pay | Admitting: Cardiology

## 2015-07-25 ENCOUNTER — Encounter: Payer: Self-pay | Admitting: Cardiology

## 2015-07-25 ENCOUNTER — Ambulatory Visit (INDEPENDENT_AMBULATORY_CARE_PROVIDER_SITE_OTHER): Payer: Medicare Other | Admitting: Cardiology

## 2015-07-25 VITALS — BP 124/70 | HR 51 | Ht 64.5 in | Wt 159.0 lb

## 2015-07-25 DIAGNOSIS — E890 Postprocedural hypothyroidism: Secondary | ICD-10-CM | POA: Diagnosis not present

## 2015-07-25 DIAGNOSIS — I272 Other secondary pulmonary hypertension: Secondary | ICD-10-CM

## 2015-07-25 DIAGNOSIS — I341 Nonrheumatic mitral (valve) prolapse: Secondary | ICD-10-CM | POA: Diagnosis not present

## 2015-07-25 DIAGNOSIS — R9431 Abnormal electrocardiogram [ECG] [EKG]: Secondary | ICD-10-CM | POA: Diagnosis not present

## 2015-07-25 DIAGNOSIS — I5181 Takotsubo syndrome: Secondary | ICD-10-CM

## 2015-07-25 NOTE — Patient Instructions (Signed)
Medication Instructions:   Your physician recommends that you continue on your current medications as directed. Please refer to the Current Medication list given to you today.   Labwork:  PRIOR TO YOUR 6 MONTH FOLLOW-UP APPOINTMENT WITH DR Meda Coffee TO CHECK A ---CMET, CBC W DIFF, TSH, AND LIPIDS---PLEASE COME FASTING TO THIS LAB APPOINTMENT    Testing/Procedures:  Your physician has requested that you have an echocardiogram. Echocardiography is a painless test that uses sound waves to create images of your heart. It provides your doctor with information about the size and shape of your heart and how well your heart's chambers and valves are working. This procedure takes approximately one hour. There are no restrictions for this procedure.    Follow-Up:  Your physician wants you to follow-up in: [place] will receive a reminder letter in the mail two months in advance. If you don't receive a letter, please call our office to schedule the follow-up appointment.   PLEASE HAVE YOUR LABS DONE PRIOR TO THIS APPOINTMENT     If you need a refill on your cardiac medications before your next appointment, please call your pharmacy.

## 2015-07-25 NOTE — Progress Notes (Signed)
Patient ID: [name], female   DOB: 1940-08-14, 74 y.o.   MRN: [id number]      Cardiology Office Note  Date:  07/25/2015   ID:  [name], [name] October 28, 1940, MRN [id number]  PCP:  Horatio Pel, MD  Cardiologist:  Dorothy Spark, MD   Chief complain: 6 months follow up   History of Present Illness: [name] is a 74 y.o. 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 She was treated with radioactive iodine and stable since then. Her LVEF has improved. Echocardiogram in 2014 showed severe pulmonary HTN with RVSP 63 mmHg. She feels great, she exercises on a regular basis and denies CP, DOE, palpitations, syncope, Complaint with her meds. No dizziness, orthopnea, PND.  She has on and off LE edema controlled by lasix 40 mg po daily.   Past Medical History  Diagnosis Date  . Dyslipidemia   . Polymyalgia rheumatica ([place])   . HTN (hypertension)   . Mitral valve prolapse     echo, January, 2011, moderate prolapse with your leaflet, trivial MR   Antibiotic required for procedures  . Tricuspid regurgitation     mild to moderate, echo, January, 2011, PA pressure 38 mm mercury  . 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 Hyponatremia   . Chronic cystitis   . Hemorrhoids   . Hx of colonoscopy   . IBS (irritable bowel syndrome)   . History of colon polyps   . Ejection fraction     EF 65%, echo, 2011  . Potassium (K) deficiency     5 potassium requirement over time  . Syncope     August, 200 weight, dehydration  . 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 CIN I (cervical intraepithelial neoplasia I)   . Osteoporosis 01/2014    T score -2.3 followed by Dr. Marcelino Scot  . PONV (postoperative nausea and vomiting)   . Heart murmur     MVP   . Orthostasis     Mild orthostatic change when she stands  . Basal cell carcinoma of nose    removed w/MOHs  . Osteoarthritis     "do not have RA" (06/17/2013)  . CAD (coronary artery disease)     a. NSTEMI 06/2013 => LHC:  mLAD 40, oD2 70 (small), pOM 20, mRCA 20, mid to dist ant HK, EF 30-35% (c/w Tako-Tsubo CM)  . 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 EF 30-35% at [facility] 06/2013;  b.  f/u Echo (06/18/13):  EF 60-65%, normal wall motion, Gr 1 DD, mild MVP of post leaflet, mod TR, PASP 63  . Atrial fibrillation ([place])   . Abnormal EKG    Past Surgical History  Procedure Laterality Date  . Excisional hemorrhoidectomy  2003  . Mohs surgery Left 2011    "side of my nose" (06/17/2013)  . Total knee arthroplasty  08/2010  . Colonoscopy  12/23/2003    normal (indications: prior adenomas and grandfather with colon cancer)  . Colposcopy    . Tonsillectomy    . Total knee arthroplasty  02/25/2012    Procedure: TOTAL KNEE ARTHROPLASTY;  Surgeon: Gearlean Alf, MD;  Location: [facility];  Service: Orthopedics;  Laterality: Right;  . Cardiac catheterization  06/17/2013  . Left heart catheterization with coronary angiogram N/A 06/17/2013    Procedure: LEFT HEART CATHETERIZATION WITH  CORONARY ANGIOGRAM;  Surgeon: Peter M Martinique, MD;  Location: [facility] C[facility] LAB;  Service: Cardiovascular;  Laterality: N/A;   Current Outpatient Prescriptions  Medication Sig Dispense Refill  . amitriptyline (ELAVIL) 25 MG tablet Take 25 mg by mouth at bedtime.     Marland Kitchen amLODipine (NORVASC) 5 MG tablet Take 1 tablet (5 mg total) by mouth daily with supper. 90 tablet 1  . atorvastatin (LIPITOR) 10 MG tablet TAKE 1 TABLET DAILY WITH   BREAKFAST 90 tablet 3  . Biotin 5000 MCG CAPS Take 1 capsule by mouth daily.     . Calcium Carbonate (CALTRATE 600 PO) Take 1 tablet by mouth daily.     . carvedilol (COREG) 12.5 MG tablet Take 6.25 mg by mouth 2 (two) times daily with a meal.    . Cholecalciferol (D3-1000) 1000 UNITS capsule Take 1,000 Units by mouth daily.    . clindamycin (CLEOCIN T) 1 % lotion Apply 1 application  topically daily.    . enalapril (VASOTEC) 20 MG tablet Take 1 tablet (20 mg total) by mouth 2 (two) times daily. 180 tablet 3  . EVENING PRIMROSE OIL PO Take 1 capsule by mouth daily.     . furosemide (LASIX) 20 MG tablet TAKE 1 TABLET DAILY WITH   BREAKFAST 90 tablet 3  . KLOR-CON M20 20 MEQ tablet TAKE 2 TABLETS EVERY       MORNING, 2 TABLETS AT      LUNCH, AND 1 TABLET AT     DINNER 450 tablet 1  . levothyroxine (SYNTHROID, LEVOTHROID) 75 MCG tablet Take 1 tablet (75 mcg total) by mouth daily. 90 tablet 1  . meloxicam (MOBIC) 15 MG tablet Take 15 mg by mouth daily.    . methylcellulose packet Take 1 each by mouth daily. CITRICEL    . nitroGLYCERIN (NITROSTAT) 0.4 MG SL tablet Place 1 tablet (0.4 mg total) under the tongue every 5 (five) minutes as needed for chest pain. 25 tablet 12  . omeprazole (PRILOSEC OTC) 20 MG tablet Take 20 mg by mouth daily. Taking until the first of the year then will take Pantoprazole.    . potassium chloride SA (KLOR-CON M20) 20 MEQ tablet TAKE 2 TABLETS EVERY MORNING, TAKE 2 TABLETS AT LUNCH AND TAKE 1 TABLET AT DINNER 450 tablet 3   No current facility-administered medications for this visit.   Allergies:   Codeine   Social History:  The patient  reports that she has never smoked. She has never used smokeless tobacco. She reports that she does not drink alcohol or use illicit drugs.   Family History:  The patient's family history includes Breast cancer in her paternal aunt; Colon cancer in her maternal grandfather; Coronary artery disease in her father; Esophageal cancer in her paternal grandmother; Heart attack (age of onset: 74) in her mother; Heart attack (age of onset: 59) in her father; Heart disease in her father, maternal grandfather, mother, paternal grandfather, and sister; Hypertension in her father, mother, and sister; Irritable bowel syndrome in her sister; Stroke in her mother.   ROS:  Please see the history of present illness.   Otherwise, review of  systems are positive for none.   All other systems are reviewed and negative.   PHYSICAL EXAM: VS:  BP 124/70 mmHg  Pulse 51  Ht 5' 4.5" (1.638 m)  Wt 159 lb (72.122 kg)  BMI 26.88 kg/m2 , BMI Body mass index is 26.88 kg/(m^2). GEN: Well nourished, well developed, in no acute distress HEENT:  normal Neck: no JVD, carotid bruits, or masses Cardiac: RRR; no murmurs, rubs, or gallops,no edema  Respiratory:  clear to auscultation bilaterally, normal work of breathing GI: soft, nontender, nondistended, + BS MS: no deformity or atrophy Skin: warm and dry, no rash Neuro:  Strength and sensation are intact Psych: euthymic mood, full affect  EKG:  EKG is ordered today. The ekg ordered today demonstrates sinus bradycardia, otherwise normal, unchanged from prior.  Recent Labs: 01/13/2015: BUN 21; Creatinine, Ser 0.99; Hemoglobin 14.3; Platelets 218.0; Potassium 4.1; Sodium 138 05/23/2015: TSH 0.63   Lipid Panel    Component Value Date/Time   CHOL 161 01/13/2015 0918   TRIG 58.0 01/13/2015 0918   TRIG 69 05/27/2006 0928   HDL 74.70 01/13/2015 0918   CHOLHDL 2 01/13/2015 0918   CHOLHDL 2.2 CALC 05/27/2006 0928   VLDL 11.6 01/13/2015 0918   LDLCALC 75 01/13/2015 0918   Wt Readings from Last 3 Encounters:  07/25/15 159 lb (72.122 kg)  05/23/15 156 lb 12.8 oz (71.124 kg)  01/05/15 157 lb 9.6 oz (71.487 kg)    TTE: 06/18/13 - Left ventricle: The cavity size was normal. Wall thickness was normal. Systolic function was normal. The estimated ejection fraction was in the range of 60% to 65%. Wall motion was normal; there were no regional wall motion abnormalities. Doppler parameters are consistent with abnormal left ventricular relaxation (grade 1 diastolic dysfunction). - Mitral valve: Prolapse. Prolapse. Mild, late systolicprolapse, involving the posterior leaflet. - Atrial septum: No defect or patent foramen ovale was identified. - Tricuspid valve: Moderate  regurgitation. - Pulmonary arteries: PA peak pressure: 48mm Hg (S).    ASSESSMENT AND PLAN:  1. Atrial fibrillation - She had limited atrial fibrillation while she was hyperthyroid. She is euthyroid now. There is no need for anticoagulation any further and it has been stopped.  2. H/o graves disease - TSH has been normalized after she was treated with radioactive iodine. She is stable.  3. Takotsubo CMP - Patient was hospitalized in November, 2014. Catheterization revealed no significant coronary disease. Wall motion abnormalities suggested Takot-subo. Her LV function normalized very rapidly within a few days. However her EKG was quite abnormal and remained quite abnormal for several months. During this. Her diagnosis of hyperthyroidism was made. It was quite unusual that she was bradycardic at that time. All of these issues are now resolved. On and off LE edema, continue lasix 40 mg po daily.  4. Pulmonary HTN - on echo in 2014, she denies DOE, we will recheck echo, she is on amlodipine already.  5. Hyperlipidemia - on atorvastatin, all lipids at goal in June 2016.  Follow up in 6 months, lipids, CMP, CBC.  Signed, Dorothy Spark, MD  07/25/2015 4:06 PM    [facility] [address], [place], [place]  [address] Phone: [phone]; Fax: [phone]

## 2015-07-26 ENCOUNTER — Other Ambulatory Visit: Payer: Self-pay | Admitting: Internal Medicine

## 2015-08-02 ENCOUNTER — Ambulatory Visit (HOSPITAL_COMMUNITY): Payer: Medicare Other | Attending: Cardiovascular Disease

## 2015-08-02 ENCOUNTER — Other Ambulatory Visit: Payer: Self-pay

## 2015-08-02 DIAGNOSIS — I340 Nonrheumatic mitral (valve) insufficiency: Secondary | ICD-10-CM | POA: Diagnosis not present

## 2015-08-02 DIAGNOSIS — E785 Hyperlipidemia, unspecified: Secondary | ICD-10-CM | POA: Insufficient documentation

## 2015-08-02 DIAGNOSIS — I272 Other secondary pulmonary hypertension: Secondary | ICD-10-CM | POA: Diagnosis not present

## 2015-08-02 DIAGNOSIS — I10 Essential (primary) hypertension: Secondary | ICD-10-CM | POA: Diagnosis not present

## 2015-08-02 DIAGNOSIS — E890 Postprocedural hypothyroidism: Secondary | ICD-10-CM

## 2015-08-02 DIAGNOSIS — I071 Rheumatic tricuspid insufficiency: Secondary | ICD-10-CM | POA: Diagnosis not present

## 2015-08-02 DIAGNOSIS — I341 Nonrheumatic mitral (valve) prolapse: Secondary | ICD-10-CM | POA: Diagnosis not present

## 2015-08-02 DIAGNOSIS — R9431 Abnormal electrocardiogram [ECG] [EKG]: Secondary | ICD-10-CM | POA: Insufficient documentation

## 2015-08-04 DIAGNOSIS — L720 Epidermal cyst: Secondary | ICD-10-CM | POA: Diagnosis not present

## 2015-08-04 DIAGNOSIS — D1801 Hemangioma of skin and subcutaneous tissue: Secondary | ICD-10-CM | POA: Diagnosis not present

## 2015-08-04 DIAGNOSIS — L814 Other melanin hyperpigmentation: Secondary | ICD-10-CM | POA: Diagnosis not present

## 2015-08-04 DIAGNOSIS — L738 Other specified follicular disorders: Secondary | ICD-10-CM | POA: Diagnosis not present

## 2015-08-04 DIAGNOSIS — Z85828 Personal history of other malignant neoplasm of skin: Secondary | ICD-10-CM | POA: Diagnosis not present

## 2015-08-04 DIAGNOSIS — D2262 Melanocytic nevi of left upper limb, including shoulder: Secondary | ICD-10-CM | POA: Diagnosis not present

## 2015-08-04 DIAGNOSIS — D2372 Other benign neoplasm of skin of left lower limb, including hip: Secondary | ICD-10-CM | POA: Diagnosis not present

## 2015-08-04 DIAGNOSIS — L821 Other seborrheic keratosis: Secondary | ICD-10-CM | POA: Diagnosis not present

## 2015-08-04 DIAGNOSIS — D2271 Melanocytic nevi of right lower limb, including hip: Secondary | ICD-10-CM | POA: Diagnosis not present

## 2015-08-04 DIAGNOSIS — D2362 Other benign neoplasm of skin of left upper limb, including shoulder: Secondary | ICD-10-CM | POA: Diagnosis not present

## 2015-08-05 ENCOUNTER — Encounter: Payer: Self-pay | Admitting: Gynecology

## 2015-08-05 ENCOUNTER — Ambulatory Visit (INDEPENDENT_AMBULATORY_CARE_PROVIDER_SITE_OTHER): Payer: Medicare Other | Admitting: Gynecology

## 2015-08-05 VITALS — BP 140/80 | Ht 64.0 in | Wt 157.0 lb

## 2015-08-05 DIAGNOSIS — N952 Postmenopausal atrophic vaginitis: Secondary | ICD-10-CM

## 2015-08-05 DIAGNOSIS — Z01419 Encounter for gynecological examination (general) (routine) without abnormal findings: Secondary | ICD-10-CM | POA: Diagnosis not present

## 2015-08-05 DIAGNOSIS — M810 Age-related osteoporosis without current pathological fracture: Secondary | ICD-10-CM

## 2015-08-05 NOTE — Patient Instructions (Signed)

## 2015-08-05 NOTE — Progress Notes (Signed)
[name] 05/18/1941 [id number]        74 y.o.  G3P3001  for breast and pelvic exam. Several issues noted below  Past medical history,surgical history, problem list, medications, allergies, family history and social history were all reviewed and documented as reviewed in the EPIC chart.  ROS:  Performed with pertinent positives and negatives included in the history, assessment and plan.   Additional significant findings :  none   Exam: Blanca assistant Filed Vitals:   08/05/15 1431  BP: 140/80  Height: 5' 4" (1.626 m)  Weight: 157 lb (71.215 kg)   General appearance:  Normal affect, orientation and appearance. Skin: Grossly normal HEENT: Without gross lesions.  No cervical or supraclavicular adenopathy. Thyroid normal.  Lungs:  Clear without wheezing, rales or rhonchi Cardiac: RR, without RMG Abdominal:  Soft, nontender, without masses, guarding, rebound, organomegaly or hernia Breasts:  Examined lying and sitting without masses, retractions, discharge or axillary adenopathy. Pelvic:  Ext/BUS/vagina with atrophic changes  Cervix with atrophic changes  Uterus anteverted, normal size, shape and contour, midline and mobile nontender   Adnexa  Without masses or tenderness    Anus and perineum  Normal   Rectovaginal  Normal sphincter tone without palpated masses or tenderness.    Assessment/Plan:  74 y.o. G3P3001 female for breast and pelvic exam.  1. Postmenopausal/atrophic genital changes. Patient doing well without significant offices, night sweats, vaginal dryness or any vaginal bleeding. Continue to monitor and report any issues or vaginal bleeding. 2. Osteoporosis. Had been on Reclast by Dr. Laster but discontinued last year.  DEXA 2015 T score -2.3. She's going to follow up with Dr. Laster for continued management and recommendations for repeat DEXA. 3. Pap smear 2012. No Pap smear done today. History of low-grade dysplasia over 40 years ago. We discussed current  screening guidelines and she is comfortable with stop screening. 4. Colonoscopy 2012. She notes that she was not given a guaiac checked by her primary.  She was given a OC Light kit. She'll return this and she knows to call 2 weeks afterwards to make sure that we've received it and it is negative. 5. Mammography due next month and she knows to schedule this. SBE monthly reviewed. 6. Health maintenance. No routine blood work done as patient does this at her primary physician's office. Follow up 1 year, sooner as needed.   , P MD, 2:54 PM 08/05/2015      

## 2015-08-15 ENCOUNTER — Other Ambulatory Visit (HOSPITAL_COMMUNITY): Payer: Medicare Other

## 2015-08-22 ENCOUNTER — Other Ambulatory Visit (INDEPENDENT_AMBULATORY_CARE_PROVIDER_SITE_OTHER): Payer: PPO

## 2015-08-22 DIAGNOSIS — E890 Postprocedural hypothyroidism: Secondary | ICD-10-CM

## 2015-08-22 LAB — TSH: TSH: 1.24 u[IU]/mL (ref 0.35–4.50)

## 2015-08-22 LAB — T4, FREE: Free T4: 1.02 ng/dL (ref 0.60–1.60)

## 2015-08-25 DIAGNOSIS — Z803 Family history of malignant neoplasm of breast: Secondary | ICD-10-CM | POA: Diagnosis not present

## 2015-08-25 DIAGNOSIS — Z1231 Encounter for screening mammogram for malignant neoplasm of breast: Secondary | ICD-10-CM | POA: Diagnosis not present

## 2015-08-26 ENCOUNTER — Encounter: Payer: Self-pay | Admitting: Gynecology

## 2015-08-31 DIAGNOSIS — R928 Other abnormal and inconclusive findings on diagnostic imaging of breast: Secondary | ICD-10-CM | POA: Diagnosis not present

## 2015-08-31 DIAGNOSIS — R922 Inconclusive mammogram: Secondary | ICD-10-CM | POA: Diagnosis not present

## 2015-08-31 DIAGNOSIS — Z1231 Encounter for screening mammogram for malignant neoplasm of breast: Secondary | ICD-10-CM | POA: Diagnosis not present

## 2015-08-31 DIAGNOSIS — Z803 Family history of malignant neoplasm of breast: Secondary | ICD-10-CM | POA: Diagnosis not present

## 2015-09-01 ENCOUNTER — Encounter: Payer: Self-pay | Admitting: Gynecology

## 2015-09-02 DIAGNOSIS — H2513 Age-related nuclear cataract, bilateral: Secondary | ICD-10-CM | POA: Diagnosis not present

## 2015-09-02 DIAGNOSIS — H40011 Open angle with borderline findings, low risk, right eye: Secondary | ICD-10-CM | POA: Diagnosis not present

## 2015-09-02 DIAGNOSIS — H40012 Open angle with borderline findings, low risk, left eye: Secondary | ICD-10-CM | POA: Diagnosis not present

## 2015-09-19 ENCOUNTER — Other Ambulatory Visit: Payer: Self-pay | Admitting: *Deleted

## 2015-09-19 DIAGNOSIS — I5181 Takotsubo syndrome: Secondary | ICD-10-CM

## 2015-09-19 MED ORDER — AMLODIPINE BESYLATE 5 MG PO TABS: 5.0000 mg | ORAL_TABLET | Freq: Every day | ORAL | Status: DC

## 2015-12-20 ENCOUNTER — Telehealth: Payer: Self-pay

## 2015-12-20 NOTE — Telephone Encounter (Signed)
Pt left a voicemail on refill depts phone, requesting a refill of her coreg 25 mg po bid.  According to the pts current med list, a historical provider prescribed this pt carvedilol 6.25 mg po bid.  Will need Dr Meda Coffee to clarify if this pt should be taking this medication, and how much she should be taking.  Will refill this medication accordingly, based on Dr Francesca Oman clarification order on this medication.

## 2015-12-21 MED ORDER — CARVEDILOL 6.25 MG PO TABS: 6.2500 mg | ORAL_TABLET | Freq: Two times a day (BID) | ORAL | Status: DC

## 2015-12-21 NOTE — Telephone Encounter (Signed)
Please refill, but only 6.25 mg po BID, thank you

## 2015-12-21 NOTE — Telephone Encounter (Signed)
MED REFILLED  AS  INSTRUCTED  PER  DR  NELSON./CY

## 2015-12-23 ENCOUNTER — Telehealth: Payer: Self-pay | Admitting: Cardiology

## 2015-12-23 MED ORDER — CARVEDILOL 6.25 MG PO TABS: 6.2500 mg | ORAL_TABLET | Freq: Two times a day (BID) | ORAL | Status: DC

## 2015-12-23 NOTE — Telephone Encounter (Signed)
Pt's Rx was resent with 90 supply to pt's pharmacy as requested. Confirmation received.

## 2015-12-23 NOTE — Telephone Encounter (Signed)
New message       *STAT* If patient is at the pharmacy, call can be transferred to refill team.   1. Which medications need to be refilled? (please list name of each medication and dose if known) coreg 2. Which pharmacy/location (including street and city if local pharmacy) is medication to be sent to?[facility] 3. Do they need a 30 day or 90 day supply? Need to change to 90 day supply----30 day supply was called in

## 2016-01-09 DIAGNOSIS — M810 Age-related osteoporosis without current pathological fracture: Secondary | ICD-10-CM | POA: Diagnosis not present

## 2016-01-13 ENCOUNTER — Telehealth: Payer: Self-pay | Admitting: Internal Medicine

## 2016-01-13 MED ORDER — LEVOTHYROXINE SODIUM 75 MCG PO TABS: 75.0000 ug | ORAL_TABLET | Freq: Every day | ORAL | Status: DC

## 2016-01-13 NOTE — Telephone Encounter (Signed)
Patient need a refill of Levothyroxine send to  [facility] [place], [place] [phone] (Phone) [phone] (Fax)

## 2016-01-13 NOTE — Telephone Encounter (Signed)
Rx submitted per pt's request.  

## 2016-01-14 ENCOUNTER — Other Ambulatory Visit: Payer: Self-pay | Admitting: Cardiology

## 2016-02-02 ENCOUNTER — Encounter: Payer: Self-pay | Admitting: Cardiology

## 2016-02-02 ENCOUNTER — Ambulatory Visit (INDEPENDENT_AMBULATORY_CARE_PROVIDER_SITE_OTHER): Payer: PPO | Admitting: Cardiology

## 2016-02-02 ENCOUNTER — Telehealth: Payer: Self-pay | Admitting: Cardiology

## 2016-02-02 VITALS — BP 122/72 | HR 53 | Ht 64.0 in | Wt 157.0 lb

## 2016-02-02 DIAGNOSIS — I272 Other secondary pulmonary hypertension: Secondary | ICD-10-CM

## 2016-02-02 DIAGNOSIS — I5181 Takotsubo syndrome: Secondary | ICD-10-CM

## 2016-02-02 DIAGNOSIS — I10 Essential (primary) hypertension: Secondary | ICD-10-CM | POA: Diagnosis not present

## 2016-02-02 DIAGNOSIS — I480 Paroxysmal atrial fibrillation: Secondary | ICD-10-CM

## 2016-02-02 DIAGNOSIS — E785 Hyperlipidemia, unspecified: Secondary | ICD-10-CM | POA: Diagnosis not present

## 2016-02-02 DIAGNOSIS — E0500 Thyrotoxicosis with diffuse goiter without thyrotoxic crisis or storm: Secondary | ICD-10-CM

## 2016-02-02 MED ORDER — HYDROCHLOROTHIAZIDE 25 MG PO TABS: 25.0000 mg | ORAL_TABLET | Freq: Every day | ORAL | Status: DC

## 2016-02-02 MED ORDER — CARVEDILOL 3.125 MG PO TABS: 3.1250 mg | ORAL_TABLET | Freq: Two times a day (BID) | ORAL | Status: DC

## 2016-02-02 NOTE — Patient Instructions (Addendum)
Medication Instructions:   STOP TAKING LASIX NOW  STOP TAKING POTASSIUM SUPPLEMENT NOW  DECREASE YOUR CARVEDILOL TO 3.125 MG BY MOUTH TWICE DAILY  START TAKING HYDROCHLOROTHIAZIDE 25 MG ONCE DAILY   Labwork:  2 WEEKS TO CHECK A --CMET, CBC W DIFF, TSH, AND BNP    Follow-Up:  Your physician wants you to follow-up in: [place] will receive a reminder letter in the mail two months in advance. If you don't receive a letter, please call our office to schedule the follow-up appointment.        If you need a refill on your cardiac medications before your next appointment, please call your pharmacy.

## 2016-02-02 NOTE — Telephone Encounter (Signed)
Per pt call:   Pt called and [facility]t [facility].

## 2016-02-02 NOTE — Progress Notes (Signed)
Patient ID: [name], female   DOB: 1940-10-17, 75 y.o.   MRN: [id number]      Cardiology Office Note  Date:  02/02/2016   ID:  Alexis, [name] 10/16/1940, MRN [id number]  PCP:  Horatio Pel, MD  Cardiologist:  Ena Dawley, MD   Chief complain: 6 months follow up   History of Present Illness: [name] is a 75 y.o. 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 She was treated with radioactive iodine and stable since then. Her LVEF has improved. Echocardiogram in 2014 showed severe pulmonary HTN with RVSP 63 mmHg. She feels great, she exercises on a regular basis and denies CP, DOE, palpitations, syncope, Complaint with her meds. No dizziness, orthopnea, PND.  She has on and off LE edema controlled by lasix 40 mg po daily.   02/02/2016 - this is a 6 months follow-up, patient is feeling overwhelmed she denies any chest pain or shortness of breath she goes to the gym as a part of sober sneakers several times a week and has no complaints no palpitations or syncope. Her only concern is that she takes arm quite a lot of potassium pill that she recently cut in half into 40 mEq daily. She hasn't had any lower extremity edema likely.  Past Medical History  Diagnosis Date  . Dyslipidemia   . Polymyalgia rheumatica ([place])   . HTN (hypertension)   . Mitral valve prolapse     echo, January, 2011, moderate prolapse with your leaflet, trivial MR   Antibiotic required for procedures  . Tricuspid regurgitation     mild to moderate, echo, January, 2011, PA pressure 38 mm mercury  . 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 Hyponatremia   . Chronic cystitis   . Hemorrhoids   . Hx of colonoscopy   . IBS (irritable bowel syndrome)   . History of colon polyps   . Ejection fraction     EF 65%, echo, 2011  . Potassium (K) deficiency     5 potassium requirement over  time  . Syncope     August, 200 weight, dehydration  . 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 CIN I (cervical intraepithelial neoplasia I)   . Osteoporosis 01/2014    T score -2.3 followed by Dr. Marcelino Scot  . PONV (postoperative nausea and vomiting)   . Heart murmur     MVP   . Orthostasis     Mild orthostatic change when she stands  . Basal cell carcinoma of nose     removed w/MOHs  . Osteoarthritis     "do not have RA" (06/17/2013)  . CAD (coronary artery disease)     a. NSTEMI 06/2013 => LHC:  mLAD 40, oD2 70 (small), pOM 20, mRCA 20, mid to dist ant HK, EF 30-35% (c/w Tako-Tsubo CM)  . 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 EF 30-35% at [facility] 06/2013;  b.  f/u Echo (06/18/13):  EF 60-65%, normal wall motion, Gr 1 DD, mild MVP of post leaflet, mod TR, PASP 63  . Atrial fibrillation ([place])   . Abnormal EKG    Past Surgical History  Procedure Laterality Date  . Excisional hemorrhoidectomy  2003  . Mohs surgery Left 2011    "side of my nose" (06/17/2013)  . Total knee arthroplasty  08/2010  . Colonoscopy  12/23/2003    normal (indications: prior adenomas and grandfather with colon cancer)  . Colposcopy    . Tonsillectomy    . Total knee arthroplasty  02/25/2012    Procedure: TOTAL KNEE ARTHROPLASTY;  Surgeon: Gearlean Alf, MD;  Location: [facility];  Service: Orthopedics;  Laterality: Right;  . Cardiac catheterization  06/17/2013  . Left heart catheterization with coronary angiogram N/A 06/17/2013    Procedure: LEFT HEART CATHETERIZATION WITH CORONARY ANGIOGRAM;  Surgeon: Peter M Martinique, MD;  Location: [facility] CATH LAB;  Service: Cardiovascular;  Laterality: N/A;   Current Outpatient Prescriptions  Medication Sig Dispense Refill  . amitriptyline (ELAVIL) 25 MG tablet Take 25 mg by mouth at bedtime.     Marland Kitchen amLODipine (NORVASC) 5 MG tablet Take 1 tablet (5 mg total) by mouth daily with supper. 90 tablet 2  . atorvastatin (LIPITOR) 10 MG tablet Take 1 tablet  by mouth every day with breakfast. 90 tablet 3  . Biotin 5000 MCG CAPS Take 1 capsule by mouth daily.     . Calcium Carbonate (CALTRATE 600 PO) Take 1 tablet by mouth daily.     . carvedilol (COREG) 6.25 MG tablet Take 1 tablet (6.25 mg total) by mouth 2 (two) times daily with a meal. 180 tablet 3  . Cholecalciferol (D3-1000) 1000 UNITS capsule Take 1,000 Units by mouth daily.    . enalapril (VASOTEC) 20 MG tablet Take 1 tablet (20 mg total) by mouth 2 (two) times daily. 180 tablet 3  . EVENING PRIMROSE OIL PO Take 1 capsule by mouth daily.     . furosemide (LASIX) 20 MG tablet Take 1 tablet by mouth every day with breakfast. 90 tablet 3  . levothyroxine (SYNTHROID, LEVOTHROID) 75 MCG tablet Take 1 tablet (75 mcg total) by mouth daily. 90 tablet 0  . meloxicam (MOBIC) 15 MG tablet Take 15 mg by mouth daily.    . methylcellulose packet Take 1 each by mouth daily. CITRICEL    . pantoprazole (PROTONIX) 40 MG tablet Take 1 tablet by mouth daily.    . potassium chloride SA (K-DUR,KLOR-CON) 20 MEQ tablet Take 40 mEq by mouth 2 (two) times daily.    . clindamycin (CLEOCIN T) 1 % lotion Apply 1 application topically daily. Reported on 02/02/2016     No current facility-administered medications for this visit.   Allergies:   Codeine   Social History:  The patient  reports that she has never smoked. She has never used smokeless tobacco. She reports that she does not drink alcohol or use illicit drugs.   Family History:  The patient's family history includes Breast cancer in her paternal aunt; Colon cancer in her maternal grandfather; Coronary artery disease in her father; Esophageal cancer in her paternal grandmother; Heart attack (age of onset: 34) in her mother; Heart attack (age of onset: 56) in her father; Heart disease in her father, maternal grandfather, mother, paternal grandfather, and sister; Hypertension in her father, mother, and sister; Irritable bowel syndrome in her sister; Stroke in her  mother.   ROS:  Please see the history of present illness.   Otherwise, review of systems are positive for none.   All other systems are reviewed and negative.   PHYSICAL EXAM: VS:  BP 122/72 mmHg  Pulse 53  Ht 5\' 4"  (1.626 m)  Wt 157 lb (71.215 kg)  BMI 26.94 kg/m2 , BMI Body mass index is 26.94 kg/(m^2). GEN: Well nourished, well developed, in no acute distress HEENT: normal Neck: no  JVD, carotid bruits, or masses Cardiac: RRR; no murmurs, rubs, or gallops,no edema  Respiratory:  clear to auscultation bilaterally, normal work of breathing GI: soft, nontender, nondistended, + BS MS: no deformity or atrophy Skin: warm and dry, no rash Neuro:  Strength and sensation are intact Psych: euthymic mood, full affect  EKG:  EKG is ordered today. The ekg ordered today demonstrates sinus bradycardia, otherwise normal, unchanged from prior.  Recent Labs: 08/22/2015: TSH 1.24   Lipid Panel    Component Value Date/Time   CHOL 161 01/13/2015 0918   TRIG 58.0 01/13/2015 0918   TRIG 69 05/27/2006 0928   HDL 74.70 01/13/2015 0918   CHOLHDL 2 01/13/2015 0918   CHOLHDL 2.2 CALC 05/27/2006 0928   VLDL 11.6 01/13/2015 0918   LDLCALC 75 01/13/2015 0918   Wt Readings from Last 3 Encounters:  02/02/16 157 lb (71.215 kg)  08/05/15 157 lb (71.215 kg)  07/25/15 159 lb (72.122 kg)    TTE: 06/18/13 - Left ventricle: The cavity size was normal. Wall thickness was normal. Systolic function was normal. The estimated ejection fraction was in the range of 60% to 65%. Wall motion was normal; there were no regional wall motion abnormalities. Doppler parameters are consistent with abnormal left ventricular relaxation (grade 1 diastolic dysfunction). - Mitral valve: Prolapse. Prolapse. Mild, late systolicprolapse, involving the posterior leaflet. - Atrial septum: No defect or patent foramen ovale was identified. - Tricuspid valve: Moderate regurgitation. - Pulmonary arteries: PA  peak pressure: 22mm Hg (S).  ECG : Sinus bradycardia, otherwise normal, unchanged from prior.   ASSESSMENT AND PLAN:  1. Atrial fibrillation - She had limited atrial fibrillation while she was hyperthyroid. She is euthyroid now. There is no need for anticoagulation any further and it has been stopped.We were discussing she should start aspirin as a stroke prevention, however she is taking Mobic and also has gastric reflux so we will hold for now. If she starts having palpitations again she supposed inform us.  2. H/o graves disease - TSH has been normalized after she was treated with radioactive iodine. She is stable.  3. Takotsubo CMP - Patient was hospitalized in November, 2014. Catheterization revealed no significant coronary disease. Wall motion abnormalities suggested Takot-subo. Her LV function normalized very rapidly within a few days. Her EKG has normalized as well. I would discontinue Lasix and potassium, start hydrochlorothiazide 25 mg daily and check BMP in 2 weeks. We will adjust potassium as needed.  4. Pulmonary HTN - on echo in 2014, on echo in 2016 RVSP 30 mmHg. she is on amlodipine already.  5. Hyperlipidemia - on atorvastatin, all lipids at goal in June 2016.  CMP and lipids in 2 weeks. Follow up in 1 year.  Signed, Ena Dawley, MD  02/02/2016 11:24 AM    [facility] [address], [place], [place]  [address] Phone: [phone]; Fax: [phone]

## 2016-02-02 NOTE — Telephone Encounter (Signed)
Notified the pt that I [facility] [address] [facility].  Pt verbalized understanding and states she is on the phone with them now, and they can visualize the electronic order.  Pt gracious for all the assistance provided.

## 2016-02-06 ENCOUNTER — Other Ambulatory Visit: Payer: Self-pay | Admitting: Cardiology

## 2016-02-15 ENCOUNTER — Other Ambulatory Visit (INDEPENDENT_AMBULATORY_CARE_PROVIDER_SITE_OTHER): Payer: PPO | Admitting: *Deleted

## 2016-02-15 ENCOUNTER — Encounter: Payer: Self-pay | Admitting: *Deleted

## 2016-02-15 DIAGNOSIS — R0602 Shortness of breath: Secondary | ICD-10-CM | POA: Diagnosis not present

## 2016-02-15 DIAGNOSIS — I480 Paroxysmal atrial fibrillation: Secondary | ICD-10-CM

## 2016-02-15 DIAGNOSIS — I5181 Takotsubo syndrome: Secondary | ICD-10-CM

## 2016-02-15 LAB — CBC WITH DIFFERENTIAL/PLATELET
Basophils Absolute: 62 cells/uL (ref 0–200)
Basophils Relative: 1 %
Eosinophils Absolute: 124 cells/uL (ref 15–500)
Eosinophils Relative: 2 %
HCT: 40.4 % (ref 35.0–45.0)
Hemoglobin: 13.3 g/dL (ref 11.7–15.5)
Lymphocytes Relative: 35 %
Lymphs Abs: 2170 cells/uL (ref 850–3900)
MCH: 29.1 pg (ref 27.0–33.0)
MCHC: 32.9 g/dL (ref 32.0–36.0)
MCV: 88.4 fL (ref 80.0–100.0)
MPV: 8.6 fL (ref 7.5–12.5)
Monocytes Absolute: 558 cells/uL (ref 200–950)
Monocytes Relative: 9 %
Neutro Abs: 3286 cells/uL (ref 1500–7800)
Neutrophils Relative %: 53 %
Platelets: 217 10*3/uL (ref 140–400)
RBC: 4.57 MIL/uL (ref 3.80–5.10)
RDW: 13.1 % (ref 11.0–15.0)
WBC: 6.2 10*3/uL (ref 3.8–10.8)

## 2016-02-15 LAB — COMPREHENSIVE METABOLIC PANEL
ALT: 15 U/L (ref 6–29)
AST: 25 U/L (ref 10–35)
Albumin: 4.1 g/dL (ref 3.6–5.1)
Alkaline Phosphatase: 57 U/L (ref 33–130)
BUN: 19 mg/dL (ref 7–25)
CO2: 26 mmol/L (ref 20–31)
Calcium: 9.2 mg/dL (ref 8.6–10.4)
Chloride: 99 mmol/L (ref 98–110)
Creat: 1.04 mg/dL — ABNORMAL HIGH (ref 0.60–0.93)
Glucose, Bld: 78 mg/dL (ref 65–99)
Potassium: 3.7 mmol/L (ref 3.5–5.3)
Sodium: 134 mmol/L — ABNORMAL LOW (ref 135–146)
Total Bilirubin: 0.7 mg/dL (ref 0.2–1.2)
Total Protein: 6.1 g/dL (ref 6.1–8.1)

## 2016-02-15 LAB — BRAIN NATRIURETIC PEPTIDE: Brain Natriuretic Peptide: 19.5 pg/mL (ref <100)

## 2016-02-15 LAB — TSH: TSH: 1.38 mIU/L

## 2016-03-02 DIAGNOSIS — H40011 Open angle with borderline findings, low risk, right eye: Secondary | ICD-10-CM | POA: Diagnosis not present

## 2016-03-02 DIAGNOSIS — H52203 Unspecified astigmatism, bilateral: Secondary | ICD-10-CM | POA: Diagnosis not present

## 2016-03-02 DIAGNOSIS — H40012 Open angle with borderline findings, low risk, left eye: Secondary | ICD-10-CM | POA: Diagnosis not present

## 2016-03-02 DIAGNOSIS — H2513 Age-related nuclear cataract, bilateral: Secondary | ICD-10-CM | POA: Diagnosis not present

## 2016-04-30 ENCOUNTER — Telehealth: Payer: Self-pay | Admitting: Internal Medicine

## 2016-04-30 ENCOUNTER — Other Ambulatory Visit: Payer: Self-pay

## 2016-04-30 MED ORDER — LEVOTHYROXINE SODIUM 75 MCG PO TABS: 75.0000 ug | ORAL_TABLET | Freq: Every day | ORAL | 0 refills | Status: DC

## 2016-04-30 NOTE — Telephone Encounter (Signed)
Pt needs levothyroxine 75 to [facility] please

## 2016-05-01 ENCOUNTER — Other Ambulatory Visit: Payer: Self-pay | Admitting: *Deleted

## 2016-05-01 DIAGNOSIS — I5181 Takotsubo syndrome: Secondary | ICD-10-CM

## 2016-05-01 MED ORDER — AMLODIPINE BESYLATE 5 MG PO TABS: 5.0000 mg | ORAL_TABLET | Freq: Every day | ORAL | 2 refills | Status: DC

## 2016-05-21 ENCOUNTER — Ambulatory Visit (INDEPENDENT_AMBULATORY_CARE_PROVIDER_SITE_OTHER): Payer: PPO | Admitting: Internal Medicine

## 2016-05-21 ENCOUNTER — Encounter: Payer: Self-pay | Admitting: Internal Medicine

## 2016-05-21 VITALS — BP 118/80 | HR 70 | Wt 157.0 lb

## 2016-05-21 DIAGNOSIS — E890 Postprocedural hypothyroidism: Secondary | ICD-10-CM | POA: Diagnosis not present

## 2016-05-21 DIAGNOSIS — Z23 Encounter for immunization: Secondary | ICD-10-CM

## 2016-05-21 DIAGNOSIS — Z8639 Personal history of other endocrine, nutritional and metabolic disease: Secondary | ICD-10-CM | POA: Diagnosis not present

## 2016-05-21 NOTE — Patient Instructions (Addendum)
Please stop Biotin and have a TSH and Free T4 checked at next lab draw by Dr. Shelia Media.  Continue Levothyroxine 75 mcg daily.  Take the thyroid hormone every day, with water, at least 30 minutes before breakfast, separated by at least 4 hours from: - acid reflux medications - calcium - iron - multivitamins  Please return in 1 year.

## 2016-05-21 NOTE — Progress Notes (Signed)
Patient ID: [name], female   DOB: 03-09-1941, 75 y.o.   MRN: [id number]   HPI  [name] is a 75 y.o.-year-old female, returning for f/u for h/o dx of Graves ds (dx after last visit) and now postablative hypothyroidism. Last visit 6 mo ago.  Reviewed hx: Patient was admitted to the hospital 06/2013 with Takotsubo cardiomyopathy, and a low TSH was found incidentally. She had cardiac catheterization on 06/17/2013 (after blood for TSH was drawn). She was also found to have atrial fibrillation in the hospital, now resolved (was on Coreg in the past and continues now). The patient's EF also normalized before discharge.  She was scheduled to have a hospital followup appointment with her PCP. Dr. Regis Bill repeated a TSH level and added a free T4 and a free T3 and these returned abnormal, suggesting thyrotoxicosis. Of note, her pulse was in the 50s and she was not in atrial fibrillation at the time of the appointment. Patient was referred to endocrinology for for further management.  We obtained an Uptake and scan (12/28/2013): Markedly elevated 24 hr radio iodine uptake of 82% Normal thyroid scan. Findings consistent with Graves disease.  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  We started LT4 25 mcg daily >> 50 mcg >> 75 mcg daily:  She is taking the LT4: - in am - eats b'fast >30 min later - no PPI - takes Ca with lunch and dinner - no MVI  I reviewed pt's thyroid tests: Lab Results  Component Value Date   TSH 1.38 02/15/2016   TSH 1.24 08/22/2015   TSH 0.63 05/23/2015   TSH 0.84 11/22/2014   TSH 3.79 08/09/2014   TSH 17.05 (H) 07/05/2014   TSH 15.69 (H) 05/13/2014   TSH 6.03 (H) 04/15/2014   TSH 0.06 (L) 03/04/2014   TSH 0.01 (L) 12/01/2013   FREET4 1.02 08/22/2015   FREET4 1.66 (H) 05/23/2015   FREET4 1.42 11/22/2014   FREET4 1.36 08/09/2014   FREET4 0.96 07/05/2014   FREET4 0.85 05/13/2014   FREET4 1.23 04/15/2014    FREET4 1.77 (H) 03/04/2014   FREET4 1.44 12/01/2013   FREET4 1.64 (H) 10/26/2013  fT3 on 06/26/2013: 6.4 (2.3 - 4.2)   Pt mentions: - no weight gain/loss - no cold intolerance - no fatigue - no constipation - no dry skin  Pt denies feeling nodules in neck, hoarseness, dysphagia/odynophagia, SOB with lying down.  She also has a h/o hypertension, hyperlipidemia, tricuspid regurgitation, mitral valve prolapse, PMR.  Since last visit, she stopped Lasix and K. She also decreased the dose of her Carvedilol.  ROS: Constitutional: see HPI Eyes: no blurry vision, no xerophthalmia ENT: no sore throat, no nodules palpated in throat, no dysphagia/odynophagia, no hoarseness, + tinnitus R ear Cardiovascular: no CP/SOB/palpitations/leg swelling Respiratory: no cough/SOB Gastrointestinal: no N/V/D/C Musculoskeletal: no muscle/joint aches Skin: no rashes Neurological: no tremors/numbness/tingling/dizziness  I reviewed pt's medications, allergies, PMH, social hx, family hx, and changes were documented in the history of present illness. Otherwise, unchanged from my initial visit note.   PE: BP 118/80 (BP Location: Left Arm, Patient Position: Sitting)   Pulse 70   Wt 157 lb (71.2 kg)   SpO2 97%   BMI 26.95 kg/m  Body mass index is 26.95 kg/m. Wt Readings from Last 3 Encounters:  05/21/16 157 lb (71.2 kg)  02/02/16 157 lb (71.2 kg)  08/05/15 157 lb (71.2 kg)  Body mass index is 26.95 kg/m.  Constitutional: normal  weight, in NAD Eyes: PERRLA, EOMI, no exophthalmos ENT: moist mucous membranes, no thyromegaly, no cervical lymphadenopathy Cardiovascular: RRR, No MRG Respiratory: CTA B Gastrointestinal: abdomen soft, NT, ND, BS+ Musculoskeletal: no deformities, strength intact in all 4 Skin: moist, warm, no rashes Neurological: no tremor with outstretched hands, could not perform DTR - patient with TKR in both knees   ASSESSMENT: 1. H/o Graves ds  2. Postablative  hypothyroidism  PLAN:  1. Patient with Graves ds., s/p RAI tx, now with postablative hypothyroidism.  - she is on a beta blocker >> continue Coreg - per cardiology - her chances of Graves recurrence are greatly reduced 2/2 her becoming hypothyroid post RAI tx - We discussed about Graves ophthalmopathy, and the fact that we cannot anticipate this complication, but she should let me know if she develops exophthalmos or pain with eye movements.  2. Postablative hypothyroidism - Last TFTs 02/2016 >> reviewed these today >> normal. Will recheck in 3 days at PCPs office (had Biotin this am) >> will stay off Biotin until then. - will check TFTs today, although she biotin this morning. I advised her to not take biotin the day she comes for labs in the future. - I advised her to take the LT4 every day, with water, >30 minutes before breakfast, separated by >4 hours from acid reflux medications, calcium, iron, multivitamins. She is taking the LT4 correctly.  - Continue LT4 75 mcg daily. - RTC in 1 year  Philemon Kingdom, MD PhD [facility]

## 2016-05-24 DIAGNOSIS — M810 Age-related osteoporosis without current pathological fracture: Secondary | ICD-10-CM | POA: Diagnosis not present

## 2016-05-24 DIAGNOSIS — I10 Essential (primary) hypertension: Secondary | ICD-10-CM | POA: Diagnosis not present

## 2016-05-24 DIAGNOSIS — E039 Hypothyroidism, unspecified: Secondary | ICD-10-CM | POA: Diagnosis not present

## 2016-05-24 DIAGNOSIS — Z0000 Encounter for general adult medical examination without abnormal findings: Secondary | ICD-10-CM | POA: Diagnosis not present

## 2016-05-24 DIAGNOSIS — Z791 Long term (current) use of non-steroidal anti-inflammatories (NSAID): Secondary | ICD-10-CM | POA: Diagnosis not present

## 2016-06-04 DIAGNOSIS — M159 Polyosteoarthritis, unspecified: Secondary | ICD-10-CM | POA: Diagnosis not present

## 2016-06-04 DIAGNOSIS — I071 Rheumatic tricuspid insufficiency: Secondary | ICD-10-CM | POA: Diagnosis not present

## 2016-06-04 DIAGNOSIS — I10 Essential (primary) hypertension: Secondary | ICD-10-CM | POA: Diagnosis not present

## 2016-06-04 DIAGNOSIS — L918 Other hypertrophic disorders of the skin: Secondary | ICD-10-CM | POA: Diagnosis not present

## 2016-06-04 DIAGNOSIS — M810 Age-related osteoporosis without current pathological fracture: Secondary | ICD-10-CM | POA: Diagnosis not present

## 2016-06-04 DIAGNOSIS — E890 Postprocedural hypothyroidism: Secondary | ICD-10-CM | POA: Diagnosis not present

## 2016-06-07 ENCOUNTER — Telehealth: Payer: Self-pay | Admitting: Internal Medicine

## 2016-06-07 ENCOUNTER — Telehealth: Payer: Self-pay

## 2016-06-07 NOTE — Telephone Encounter (Signed)
Called patient and notified them that I had not received any lab work yet from [facility], Dr.Walter Concha Pyo; but I would give them a call if I had no received it by today and get them to fax it to [place]. Patient had no other concerns at this time.

## 2016-06-07 NOTE — Telephone Encounter (Signed)
Patient ask if you got her labs from her internist Doctor please advise.

## 2016-07-12 ENCOUNTER — Telehealth: Payer: Self-pay | Admitting: Cardiology

## 2016-07-12 NOTE — Telephone Encounter (Signed)
Spoke with Mateo Flow at [facility], and informed him that Dr Meda Coffee did not prescribe this pt diclofenac gel, for we are Cardiology.  Advised the representative to contact the pts PCP for further assistance, for he may have prescribed this med to the pt.  Mateo Flow verbalized understanding and was apologetic for following up with the incorrect ordering MD.

## 2016-07-12 NOTE — Telephone Encounter (Signed)
New Message  Representative voiced waiting for prior authorization for Diclofenac gel 3% 300 for 90 days.  Please f/u

## 2016-07-20 ENCOUNTER — Other Ambulatory Visit: Payer: Self-pay | Admitting: Pharmacist

## 2016-07-20 NOTE — Patient Outreach (Signed)
Outreach call to [name] regarding her request for follow up from the [facility] Medication Adherence Campaign. Left a HIPAA compliant message on the patient's voicemail.  Harlow Asa, PharmD, [place] [facility] [phone]

## 2016-07-25 ENCOUNTER — Other Ambulatory Visit: Payer: Self-pay | Admitting: Pharmacist

## 2016-07-25 NOTE — Patient Outreach (Signed)
Receive a voicemail from patient [name] regarding her request for follow up from the [facility] Medication Adherence Campaign. Called and spoke with patient. HIPAA identifiers verified and verbal consent received.  [name] reports that she takes her atorvastatin daily as directed. Denies any missed doses or barriers to taking this medication such as cost or side effects. Counsel patient about the importance of medication adherence and medication adherence strategies.  Reports that she has been having difficulty with getting her generic Voltaren gel. Reports that the prescription required a prior authorization that her doctor completed, but that the prior authorization request was then denied. Counsel [name] to help her to better understand the process. Patient reports that she is not eligible for patient assistance. Reports that she does have a medication savings card from [facility] for the medication.  [name] reports that she has no further medication questions for me at this time. Patient confirms that she has my phone number.  Harlow Asa, PharmD, [place] [facility] [phone]

## 2016-08-02 DIAGNOSIS — L738 Other specified follicular disorders: Secondary | ICD-10-CM | POA: Diagnosis not present

## 2016-08-02 DIAGNOSIS — D2271 Melanocytic nevi of right lower limb, including hip: Secondary | ICD-10-CM | POA: Diagnosis not present

## 2016-08-02 DIAGNOSIS — L720 Epidermal cyst: Secondary | ICD-10-CM | POA: Diagnosis not present

## 2016-08-02 DIAGNOSIS — D1801 Hemangioma of skin and subcutaneous tissue: Secondary | ICD-10-CM | POA: Diagnosis not present

## 2016-08-02 DIAGNOSIS — D2262 Melanocytic nevi of left upper limb, including shoulder: Secondary | ICD-10-CM | POA: Diagnosis not present

## 2016-08-02 DIAGNOSIS — L821 Other seborrheic keratosis: Secondary | ICD-10-CM | POA: Diagnosis not present

## 2016-08-02 DIAGNOSIS — Z85828 Personal history of other malignant neoplasm of skin: Secondary | ICD-10-CM | POA: Diagnosis not present

## 2016-08-02 DIAGNOSIS — L814 Other melanin hyperpigmentation: Secondary | ICD-10-CM | POA: Diagnosis not present

## 2016-08-09 ENCOUNTER — Encounter: Payer: Self-pay | Admitting: Gynecology

## 2016-08-09 ENCOUNTER — Ambulatory Visit (INDEPENDENT_AMBULATORY_CARE_PROVIDER_SITE_OTHER): Payer: PPO | Admitting: Gynecology

## 2016-08-09 VITALS — BP 122/78 | Ht 64.5 in | Wt 157.0 lb

## 2016-08-09 DIAGNOSIS — Z01411 Encounter for gynecological examination (general) (routine) with abnormal findings: Secondary | ICD-10-CM | POA: Diagnosis not present

## 2016-08-09 DIAGNOSIS — N952 Postmenopausal atrophic vaginitis: Secondary | ICD-10-CM

## 2016-08-09 DIAGNOSIS — M810 Age-related osteoporosis without current pathological fracture: Secondary | ICD-10-CM

## 2016-08-09 NOTE — Progress Notes (Signed)
    [name] May 04, 1941 [id number]        75 y.o.  G3P3001 for breast and pelvic exam.  Past medical history,surgical history, problem list, medications, allergies, family history and social history were all reviewed and documented as reviewed in the EPIC chart.  ROS:  Performed with pertinent positives and negatives included in the history, assessment and plan.   Additional significant findings :  None   Exam: [name] assistant Vitals:   08/09/16 1123  BP: 122/78  Weight: 157 lb (71.2 kg)  Height: 5' 4.5" (1.638 m)   Body mass index is 26.53 kg/m.  General appearance:  Normal affect, orientation and appearance. Skin: Grossly normal HEENT: Without gross lesions.  No cervical or supraclavicular adenopathy. Thyroid normal.  Lungs:  Clear without wheezing, rales or rhonchi Cardiac: RR, without RMG Abdominal:  Soft, nontender, without masses, guarding, rebound, organomegaly or hernia Breasts:  Examined lying and sitting without masses, retractions, discharge or axillary adenopathy. Pelvic:  Ext, BUS, Vagina With atrophic changes  Cervix with atrophic changes  Uterus anteverted, normal size, shape and contour, midline and mobile nontender   Adnexa without masses or tenderness    Anus and perineum normal   Rectovaginal normal sphincter tone without palpated masses or tenderness.    Assessment/Plan:  76 y.o. G27P3001 female for breast and pelvic exam.  1. Postmenopausal/atrophic genital changes. No significant hot flushes, night sweats, vaginal dryness or any vaginal bleeding. Continue to monitor report any issues or bleeding. 2. Osteoporosis. DEXA 2015 T score -2.3 followed by Dr. Marcelino Scot. Is planning follow up DEXA this coming year. We'll continue to follow up with them in reference to management of her osteoporosis. 3. Pap smear 2012. No Pap smear done today. History of low-grade dysplasia over 40 years ago with normal Pap smears since. We both are comfortable with  stop screening per current screening guidelines based on age. 4. Colonoscopy 2012. Patient reports recommended repeat interval 7 years. 5. Mammography due this month and patients in the process of arranging. SBE monthly reviewed. 6. Health maintenance. No routine lab work done as patient reports this done elsewhere. Follow up 1 year, sooner as needed.   Anastasio Auerbach MD, 11:51 AM 08/09/2016

## 2016-08-09 NOTE — Patient Instructions (Signed)

## 2016-08-13 ENCOUNTER — Telehealth: Payer: Self-pay | Admitting: Internal Medicine

## 2016-08-13 ENCOUNTER — Other Ambulatory Visit: Payer: Self-pay

## 2016-08-13 MED ORDER — LEVOTHYROXINE SODIUM 75 MCG PO TABS: 75.0000 ug | ORAL_TABLET | Freq: Every day | ORAL | 0 refills | Status: DC

## 2016-08-13 NOTE — Telephone Encounter (Signed)
Sent in RX

## 2016-08-13 NOTE — Telephone Encounter (Signed)
Pt needs levothyroxine 75 mg called into [facility] on [address] 90 day supply  Pt has no more

## 2016-08-21 ENCOUNTER — Other Ambulatory Visit: Payer: Self-pay | Admitting: Gynecology

## 2016-08-21 DIAGNOSIS — Z1231 Encounter for screening mammogram for malignant neoplasm of breast: Secondary | ICD-10-CM

## 2016-09-13 ENCOUNTER — Ambulatory Visit
Admission: RE | Admit: 2016-09-13 | Discharge: 2016-09-13 | Disposition: A | Discharge status: Home / Self Care | Payer: PPO | Source: Ambulatory Visit | Attending: Gynecology | Admitting: Gynecology

## 2016-09-13 DIAGNOSIS — Z1231 Encounter for screening mammogram for malignant neoplasm of breast: Secondary | ICD-10-CM

## 2016-10-01 ENCOUNTER — Telehealth: Payer: Self-pay | Admitting: Internal Medicine

## 2016-10-01 NOTE — Telephone Encounter (Signed)
Refill of   levothyroxine (SYNTHROID, LEVOTHROID) 75 MCG tablet 90 tablet   [id number] [address], [place] (Phone) [phone] (Fax)

## 2016-10-24 DIAGNOSIS — L723 Sebaceous cyst: Secondary | ICD-10-CM | POA: Diagnosis not present

## 2016-10-24 DIAGNOSIS — L905 Scar conditions and fibrosis of skin: Secondary | ICD-10-CM | POA: Diagnosis not present

## 2016-10-24 DIAGNOSIS — Z85828 Personal history of other malignant neoplasm of skin: Secondary | ICD-10-CM | POA: Diagnosis not present

## 2016-10-29 ENCOUNTER — Other Ambulatory Visit: Payer: Self-pay | Admitting: Internal Medicine

## 2017-01-01 ENCOUNTER — Other Ambulatory Visit: Payer: Self-pay | Admitting: *Deleted

## 2017-01-01 DIAGNOSIS — I5181 Takotsubo syndrome: Secondary | ICD-10-CM

## 2017-01-01 MED ORDER — ATORVASTATIN CALCIUM 10 MG PO TABS: ORAL_TABLET | ORAL | 0 refills | Status: DC

## 2017-01-01 MED ORDER — AMLODIPINE BESYLATE 5 MG PO TABS: 5.0000 mg | ORAL_TABLET | Freq: Every day | ORAL | 0 refills | Status: DC

## 2017-01-07 DIAGNOSIS — E559 Vitamin D deficiency, unspecified: Secondary | ICD-10-CM | POA: Diagnosis not present

## 2017-01-07 DIAGNOSIS — M810 Age-related osteoporosis without current pathological fracture: Secondary | ICD-10-CM | POA: Diagnosis not present

## 2017-01-07 DIAGNOSIS — Z791 Long term (current) use of non-steroidal anti-inflammatories (NSAID): Secondary | ICD-10-CM | POA: Diagnosis not present

## 2017-01-07 DIAGNOSIS — Z6826 Body mass index (BMI) 26.0-26.9, adult: Secondary | ICD-10-CM | POA: Diagnosis not present

## 2017-01-15 ENCOUNTER — Other Ambulatory Visit: Payer: Self-pay | Admitting: Internal Medicine

## 2017-02-21 ENCOUNTER — Encounter: Payer: Self-pay | Admitting: Cardiology

## 2017-03-01 ENCOUNTER — Other Ambulatory Visit: Payer: Self-pay | Admitting: Cardiology

## 2017-03-01 DIAGNOSIS — I480 Paroxysmal atrial fibrillation: Secondary | ICD-10-CM

## 2017-03-01 DIAGNOSIS — I5181 Takotsubo syndrome: Secondary | ICD-10-CM

## 2017-03-01 MED ORDER — HYDROCHLOROTHIAZIDE 25 MG PO TABS: 25.0000 mg | ORAL_TABLET | Freq: Every day | ORAL | 0 refills | Status: DC

## 2017-03-01 NOTE — Telephone Encounter (Signed)
Pt's medication was sent to pt's pharmacy as requested. Confirmation received.  °

## 2017-03-12 ENCOUNTER — Telehealth: Payer: Self-pay | Admitting: Cardiology

## 2017-03-12 MED ORDER — ATORVASTATIN CALCIUM 10 MG PO TABS: ORAL_TABLET | ORAL | 3 refills | Status: DC

## 2017-03-12 MED ORDER — ENALAPRIL MALEATE 20 MG PO TABS
20.0000 mg | ORAL_TABLET | Freq: Two times a day (BID) | ORAL | 3 refills | Status: DC
Start: 2017-03-12 — End: 2017-04-05

## 2017-03-12 NOTE — Telephone Encounter (Signed)
Patient calling for refills on enalapril and atorvastatin. Refills sent to patient's preferred pharmacy.

## 2017-03-12 NOTE — Telephone Encounter (Signed)
New message   Pt states her pharmacy states that they do not have any record of her being on Enalipril 20MG . She is confused and requests a call back   .

## 2017-03-14 ENCOUNTER — Ambulatory Visit: Payer: PPO | Admitting: Cardiology

## 2017-03-21 DIAGNOSIS — M722 Plantar fascial fibromatosis: Secondary | ICD-10-CM | POA: Diagnosis not present

## 2017-04-05 ENCOUNTER — Encounter: Payer: Self-pay | Admitting: Cardiology

## 2017-04-05 ENCOUNTER — Ambulatory Visit (INDEPENDENT_AMBULATORY_CARE_PROVIDER_SITE_OTHER): Payer: PPO | Admitting: Cardiology

## 2017-04-05 VITALS — BP 124/72 | HR 59 | Ht 64.0 in | Wt 158.5 lb

## 2017-04-05 DIAGNOSIS — I5181 Takotsubo syndrome: Secondary | ICD-10-CM

## 2017-04-05 DIAGNOSIS — I272 Pulmonary hypertension, unspecified: Secondary | ICD-10-CM | POA: Diagnosis not present

## 2017-04-05 DIAGNOSIS — E785 Hyperlipidemia, unspecified: Secondary | ICD-10-CM | POA: Diagnosis not present

## 2017-04-05 DIAGNOSIS — I480 Paroxysmal atrial fibrillation: Secondary | ICD-10-CM

## 2017-04-05 MED ORDER — HYDROCHLOROTHIAZIDE 25 MG PO TABS: 25.0000 mg | ORAL_TABLET | Freq: Every day | ORAL | 3 refills | Status: DC

## 2017-04-05 MED ORDER — CARVEDILOL 3.125 MG PO TABS: 3.1250 mg | ORAL_TABLET | Freq: Two times a day (BID) | ORAL | 3 refills | Status: DC

## 2017-04-05 MED ORDER — ATORVASTATIN CALCIUM 10 MG PO TABS: ORAL_TABLET | ORAL | 3 refills | Status: DC

## 2017-04-05 MED ORDER — ENALAPRIL MALEATE 20 MG PO TABS: 20.0000 mg | ORAL_TABLET | Freq: Two times a day (BID) | ORAL | 3 refills | Status: DC

## 2017-04-05 MED ORDER — AMLODIPINE BESYLATE 5 MG PO TABS: 5.0000 mg | ORAL_TABLET | Freq: Every day | ORAL | 3 refills | Status: DC

## 2017-04-05 NOTE — Progress Notes (Signed)
Patient ID: [name], female   DOB: 1941/03/08, 76 y.o.   MRN: [id number]      Cardiology Office Note  Date:  04/05/2017   ID:  [name] [birth year], MRN [id number]  PCP:  Deland Pretty, MD  Cardiologist:  Ena Dawley, MD   Chief complain: 1 year follow up   History of Present Illness: [name] is a 76 y.o. 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 She was treated with radioactive iodine and stable since then. Her LVEF has improved. Echocardiogram in 2014 showed severe pulmonary HTN with RVSP 63 mmHg. She feels great, she exercises on a regular basis and denies CP, DOE, palpitations, syncope, Complaint with her meds. No dizziness, orthopnea, PND.  She has on and off LE edema controlled by lasix 40 mg po daily.   02/02/2016 - this is a 6 months follow-up, patient is feeling overwhelmed she denies any chest pain or shortness of breath she goes to the gym as a part of sober sneakers several times a week and has no complaints no palpitations or syncope. Her only concern is that she takes arm quite a lot of potassium pill that she recently cut in half into 40 mEq daily. She hasn't had any lower extremity edema likely.  04/05/2017 - this is one year follow-up, patient has been doing well, she denies any chest pain, shortness of breath no palpitations no dizziness or lower extremity edema orthopnea or paroxysmal nocturnal dyspnea. 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 She has been taking all her medications and has no side effects.  Past Medical History:  Diagnosis Date  . Abnormal EKG   . Atrial fibrillation ([place])   . Basal cell carcinoma of nose    removed w/MOHs  . CAD (coronary artery disease)    a. NSTEMI 06/2013 => LHC:  mLAD 40, oD2 70 (small), pOM 20, mRCA 20, mid to dist ant HK, EF 30-35% (c/w Tako-Tsubo CM)  . Chronic cystitis   . CIN I  (cervical intraepithelial neoplasia I)   . Dyslipidemia   . Ejection fraction    EF 65%, echo, 2011  . Heart murmur    MVP   . Hemorrhoids   . History of colon polyps   . HTN (hypertension)   . Hx of colonoscopy   . Hyponatremia   . 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 IBS (irritable bowel syndrome)   . Mitral valve prolapse    echo, January, 2011, moderate prolapse with your leaflet, trivial MR   Antibiotic required for procedures  . Orthostasis    Mild orthostatic change when she stands  . Osteoarthritis    "do not have RA" (06/17/2013)  . Osteoporosis 01/2014   T score -2.3 followed by Dr. Marcelino Scot  . Polymyalgia rheumatica ([place])   . PONV (postoperative nausea and vomiting)   . Potassium (K) deficiency    5 potassium requirement over time  . 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 Syncope    August, 200 weight, dehydration  . 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 EF 30-35% at [facility] 06/2013;  b.  f/u Echo (06/18/13):  EF 60-65%, normal wall motion, Gr 1 DD, mild MVP of post leaflet, mod TR, PASP 63  . Tricuspid regurgitation    mild to moderate,  echo, January, 2011, PA pressure 38 mm mercury   Past Surgical History:  Procedure Laterality Date  . CARDIAC CATHETERIZATION  06/17/2013  . COLONOSCOPY  12/23/2003   normal (indications: prior adenomas and grandfather with colon cancer)  . COLPOSCOPY    . EXCISIONAL HEMORRHOIDECTOMY  2003  . LEFT HEART CATHETERIZATION WITH CORONARY ANGIOGRAM N/A 06/17/2013   Procedure: LEFT HEART CATHETERIZATION WITH CORONARY ANGIOGRAM;  Surgeon: Peter M Martinique, MD;  Location: [facility] CATH LAB;  Service: Cardiovascular;  Laterality: N/A;  . MOHS SURGERY Left 2011   "side of my nose" (06/17/2013)  . TONSILLECTOMY    . TOTAL KNEE ARTHROPLASTY  08/2010  . TOTAL KNEE ARTHROPLASTY  02/25/2012   Procedure: TOTAL KNEE ARTHROPLASTY;  Surgeon: Gearlean Alf, MD;  Location: [facility] ORS;   Service: Orthopedics;  Laterality: Right;   Current Outpatient Prescriptions  Medication Sig Dispense Refill  . amitriptyline (ELAVIL) 25 MG tablet Take 25 mg by mouth at bedtime.     Marland Kitchen amLODipine (NORVASC) 5 MG tablet Take 1 tablet (5 mg total) by mouth daily with supper. 90 tablet 0  . atorvastatin (LIPITOR) 10 MG tablet Take 1 tablet by mouth every day with breakfast. 90 tablet 3  . Biotin 5000 MCG CAPS Take 1 capsule by mouth daily.     . Calcium Carbonate (CALTRATE 600 PO) Take 1 tablet by mouth daily.     . carvedilol (COREG) 3.125 MG tablet Take 1 tablet (3.125 mg total) by mouth 2 (two) times daily. 180 tablet 11  . Cholecalciferol (D3-1000) 1000 UNITS capsule Take 1,000 Units by mouth daily.    . clindamycin (CLEOCIN T) 1 % lotion Apply 1 application topically daily. Reported on 02/02/2016    . enalapril (VASOTEC) 20 MG tablet Take 1 tablet (20 mg total) by mouth 2 (two) times daily. 180 tablet 3  . EVENING PRIMROSE OIL PO Take 1 capsule by mouth daily.     . hydrochlorothiazide (HYDRODIURIL) 25 MG tablet Take 1 tablet (25 mg total) by mouth daily. 90 tablet 0  . levothyroxine (SYNTHROID, LEVOTHROID) 75 MCG tablet TAKE 1 TABLET (75 MCG TOTAL) BY MOUTH DAILY. 90 tablet 0  . meloxicam (MOBIC) 15 MG tablet Take 15 mg by mouth daily.    . methylcellulose packet Take 1 each by mouth daily. CITRICEL    . pantoprazole (PROTONIX) 40 MG tablet Take 1 tablet by mouth daily.     No current facility-administered medications for this visit.    Allergies:   Codeine   Social History:  The patient  reports that she has never smoked. She has never used smokeless tobacco. She reports that she does not drink alcohol or use drugs.   Family History:  The patient's family history includes Breast cancer in her paternal aunt and sister; Colon cancer in her maternal grandfather; Coronary artery disease in her father; Esophageal cancer in her paternal grandmother; Heart attack (age of onset: 33) in her  mother; Heart attack (age of onset: 37) in her father; Heart disease in her father, maternal grandfather, mother, paternal grandfather, and sister; Hypertension in her father, mother, and sister; Irritable bowel syndrome in her sister; Stroke in her mother.   ROS:  Please see the history of present illness.   Otherwise, review of systems are positive for none.   All other systems are reviewed and negative.   PHYSICAL EXAM: VS:  BP 124/72   Pulse (!) 59   Ht 5\' 4"  (1.626 m)   Wt 158 lb 8  oz (71.9 kg)   SpO2 93%   BMI 27.21 kg/m  , BMI Body mass index is 27.21 kg/m. GEN: Well nourished, well developed, in no acute distress HEENT: normal Neck: no JVD, carotid bruits, or masses Cardiac: RRR; no murmurs, rubs, or gallops,no edema  Respiratory:  clear to auscultation bilaterally, normal work of breathing GI: soft, nontender, nondistended, + BS MS: no deformity or atrophy Skin: warm and dry, no rash Neuro:  Strength and sensation are intact Psych: euthymic mood, full affect  EKG:  EKG is ordered today. The ekg ordered today demonstrates sinus bradycardia, otherwise normal, unchanged from prior.  Recent Labs: No results found for requested labs within last 8760 hours.   Lipid Panel    Component Value Date/Time   CHOL 161 01/13/2015 0918   TRIG 58.0 01/13/2015 0918   TRIG 69 05/27/2006 0928   HDL 74.70 01/13/2015 0918   CHOLHDL 2 01/13/2015 0918   VLDL 11.6 01/13/2015 0918   LDLCALC 75 01/13/2015 0918   Wt Readings from Last 3 Encounters:  04/05/17 158 lb 8 oz (71.9 kg)  08/09/16 157 lb (71.2 kg)  05/21/16 157 lb (71.2 kg)    TTE: 06/18/13 - Left ventricle: The cavity size was normal. Wall thickness was normal. Systolic function was normal. The estimated ejection fraction was in the range of 60% to 65%. Wall motion was normal; there were no regional wall motion abnormalities. Doppler parameters are consistent with abnormal left ventricular relaxation (grade 1  diastolic dysfunction). - Mitral valve: Prolapse. Prolapse. Mild, late systolicprolapse, involving the posterior leaflet. - Atrial septum: No defect or patent foramen ovale was identified. - Tricuspid valve: Moderate regurgitation. - Pulmonary arteries: PA peak pressure: 80mm Hg (S).  ECG : Sinus bradycardia ventricular rate 59 bpm otherwise normal EKG unchanged from prior.   ASSESSMENT AND PLAN:  1. Atrial fibrillation - She had limited atrial fibrillation while she was hyperthyroid. She is euthyroid now. There is no need for anticoagulation any further and it has been stopped.We were discussing she should start aspirin as a stroke prevention, however she is taking Mobic and also has gastric reflux so we will hold for now. If she starts having palpitations again she supposed inform us. No further atrial fibrillation episodes.  2. H/o graves disease - TSH has been normalized after she was treated with radioactive iodine. She is stable.  3. Takotsubo CMP - Patient was hospitalized in November, 2014. Catheterization revealed no significant coronary disease. Wall motion abnormalities suggested Takot-subo. Her LV function normalized very rapidly within a few days. Her EKG has normalized as well. She is completely euvolemic, her EKG is normal.  4. Pulmonary HTN - on echo in 2014, on echo in 2016 RVSP 30 mmHg. Continue amlodipine.  5. Hyperlipidemia - on atorvastatin, all lipids at goal in June 2016. We will repeat now.  Check CMP, lipids, CBC. Follow up in 1 year.  Signed, Ena Dawley, MD  04/05/2017 1:48 PM    [facility] [address], [place], [place]  [address] Phone: [phone]; Fax: [phone]

## 2017-04-05 NOTE — Patient Instructions (Signed)
Medication Instructions:  Your physician recommends that you continue on your current medications as directed. Please refer to the Current Medication list given to you today.   Labwork: LAB WORK TO BE DONE IN 1 WEEK; BMET, CBC, TSH, LIPID, LIVER  Testing/Procedures: NONE ORDERED TODAY  Follow-Up: Your physician wants you to follow-up in: [place] will receive a reminder letter in the mail two months in advance. If you don't receive a letter, please call our office to schedule the follow-up appointment.   Any Other Special Instructions Will Be Listed Below (If Applicable).     If you need a refill on your cardiac medications before your next appointment, please call your pharmacy.

## 2017-04-09 ENCOUNTER — Other Ambulatory Visit: Payer: Self-pay | Admitting: *Deleted

## 2017-04-09 ENCOUNTER — Other Ambulatory Visit: Payer: PPO | Admitting: *Deleted

## 2017-04-09 DIAGNOSIS — I480 Paroxysmal atrial fibrillation: Secondary | ICD-10-CM

## 2017-04-09 DIAGNOSIS — E785 Hyperlipidemia, unspecified: Secondary | ICD-10-CM | POA: Diagnosis not present

## 2017-04-09 DIAGNOSIS — I272 Pulmonary hypertension, unspecified: Secondary | ICD-10-CM | POA: Diagnosis not present

## 2017-04-09 DIAGNOSIS — I5181 Takotsubo syndrome: Secondary | ICD-10-CM

## 2017-04-09 LAB — HEPATIC FUNCTION PANEL
ALT: 14 IU/L (ref 0–32)
AST: 19 IU/L (ref 0–40)
Albumin: 4 g/dL (ref 3.5–4.8)
Alkaline Phosphatase: 71 IU/L (ref 39–117)
Bilirubin Total: 0.4 mg/dL (ref 0.0–1.2)
Bilirubin, Direct: 0.1 mg/dL (ref 0.00–0.40)
Total Protein: 6.2 g/dL (ref 6.0–8.5)

## 2017-04-09 LAB — BASIC METABOLIC PANEL
BUN/Creatinine Ratio: 21 (ref 12–28)
BUN: 21 mg/dL (ref 8–27)
CO2: 22 mmol/L (ref 20–29)
Calcium: 9.3 mg/dL (ref 8.7–10.3)
Chloride: 98 mmol/L (ref 96–106)
Creatinine, Ser: 0.99 mg/dL (ref 0.57–1.00)
GFR calc Af Amer: 64 mL/min/{1.73_m2} (ref >59)
GFR calc non Af Amer: 55 mL/min/{1.73_m2} — ABNORMAL LOW (ref >59)
Glucose: 90 mg/dL (ref 65–99)
Potassium: 3.9 mmol/L (ref 3.5–5.2)
Sodium: 139 mmol/L (ref 134–144)

## 2017-04-09 LAB — LIPID PANEL
Chol/HDL Ratio: 2.1 ratio (ref 0.0–4.4)
Cholesterol, Total: 152 mg/dL (ref 100–199)
HDL: 71 mg/dL (ref >39)
LDL Calculated: 65 mg/dL (ref 0–99)
Triglycerides: 78 mg/dL (ref 0–149)
VLDL Cholesterol Cal: 16 mg/dL (ref 5–40)

## 2017-04-09 LAB — CBC
Hematocrit: 39.8 % (ref 34.0–46.6)
Hemoglobin: 12.9 g/dL (ref 11.1–15.9)
MCH: 28.2 pg (ref 26.6–33.0)
MCHC: 32.4 g/dL (ref 31.5–35.7)
MCV: 87 fL (ref 79–97)
Platelets: 218 10*3/uL (ref 150–379)
RBC: 4.57 x10E6/uL (ref 3.77–5.28)
RDW: 14.1 % (ref 12.3–15.4)
WBC: 5.7 10*3/uL (ref 3.4–10.8)

## 2017-04-09 LAB — TSH: TSH: 3.11 u[IU]/mL (ref 0.450–4.500)

## 2017-04-09 MED ORDER — ATORVASTATIN CALCIUM 10 MG PO TABS: ORAL_TABLET | ORAL | 3 refills | Status: DC

## 2017-04-09 MED ORDER — AMLODIPINE BESYLATE 5 MG PO TABS: 5.0000 mg | ORAL_TABLET | Freq: Every day | ORAL | 3 refills | Status: DC

## 2017-04-09 MED ORDER — CARVEDILOL 3.125 MG PO TABS: 3.1250 mg | ORAL_TABLET | Freq: Two times a day (BID) | ORAL | 3 refills | Status: DC

## 2017-04-09 MED ORDER — HYDROCHLOROTHIAZIDE 25 MG PO TABS: 25.0000 mg | ORAL_TABLET | Freq: Every day | ORAL | 3 refills | Status: DC

## 2017-04-09 MED ORDER — ENALAPRIL MALEATE 20 MG PO TABS: 20.0000 mg | ORAL_TABLET | Freq: Two times a day (BID) | ORAL | 3 refills | Status: DC

## 2017-04-12 DIAGNOSIS — H2513 Age-related nuclear cataract, bilateral: Secondary | ICD-10-CM | POA: Diagnosis not present

## 2017-04-12 DIAGNOSIS — H5203 Hypermetropia, bilateral: Secondary | ICD-10-CM | POA: Diagnosis not present

## 2017-04-15 ENCOUNTER — Other Ambulatory Visit: Payer: Self-pay | Admitting: Internal Medicine

## 2017-04-17 ENCOUNTER — Other Ambulatory Visit: Payer: Self-pay | Admitting: Sports Medicine

## 2017-04-17 DIAGNOSIS — S32040A Wedge compression fracture of fourth lumbar vertebra, initial encounter for closed fracture: Secondary | ICD-10-CM

## 2017-04-17 DIAGNOSIS — M810 Age-related osteoporosis without current pathological fracture: Secondary | ICD-10-CM | POA: Diagnosis not present

## 2017-04-17 DIAGNOSIS — M545 Low back pain: Secondary | ICD-10-CM | POA: Diagnosis not present

## 2017-04-22 ENCOUNTER — Encounter: Payer: Self-pay | Admitting: Internal Medicine

## 2017-04-22 ENCOUNTER — Other Ambulatory Visit: Payer: Self-pay

## 2017-04-22 MED ORDER — LEVOTHYROXINE SODIUM 75 MCG PO TABS: 75.0000 ug | ORAL_TABLET | Freq: Every day | ORAL | 0 refills | Status: DC

## 2017-04-24 ENCOUNTER — Ambulatory Visit
Admission: RE | Admit: 2017-04-24 | Discharge: 2017-04-24 | Disposition: A | Discharge status: Home / Self Care | Payer: PPO | Source: Ambulatory Visit | Attending: Sports Medicine | Admitting: Sports Medicine

## 2017-04-24 DIAGNOSIS — S32040A Wedge compression fracture of fourth lumbar vertebra, initial encounter for closed fracture: Secondary | ICD-10-CM

## 2017-04-24 DIAGNOSIS — M545 Low back pain: Secondary | ICD-10-CM | POA: Diagnosis not present

## 2017-04-24 DIAGNOSIS — S3992XA Unspecified injury of lower back, initial encounter: Secondary | ICD-10-CM | POA: Diagnosis not present

## 2017-04-26 DIAGNOSIS — M810 Age-related osteoporosis without current pathological fracture: Secondary | ICD-10-CM | POA: Diagnosis not present

## 2017-04-26 DIAGNOSIS — S32040A Wedge compression fracture of fourth lumbar vertebra, initial encounter for closed fracture: Secondary | ICD-10-CM | POA: Diagnosis not present

## 2017-05-01 ENCOUNTER — Other Ambulatory Visit: Payer: PPO

## 2017-05-15 DIAGNOSIS — M4120 Other idiopathic scoliosis, site unspecified: Secondary | ICD-10-CM | POA: Diagnosis not present

## 2017-05-15 DIAGNOSIS — S32049A Unspecified fracture of fourth lumbar vertebra, initial encounter for closed fracture: Secondary | ICD-10-CM | POA: Diagnosis not present

## 2017-05-15 DIAGNOSIS — M545 Low back pain: Secondary | ICD-10-CM | POA: Diagnosis not present

## 2017-05-15 DIAGNOSIS — M4316 Spondylolisthesis, lumbar region: Secondary | ICD-10-CM | POA: Diagnosis not present

## 2017-05-15 DIAGNOSIS — M5416 Radiculopathy, lumbar region: Secondary | ICD-10-CM | POA: Diagnosis not present

## 2017-05-20 ENCOUNTER — Ambulatory Visit (INDEPENDENT_AMBULATORY_CARE_PROVIDER_SITE_OTHER): Payer: PPO | Admitting: Internal Medicine

## 2017-05-20 ENCOUNTER — Encounter: Payer: Self-pay | Admitting: Internal Medicine

## 2017-05-20 VITALS — BP 114/62 | HR 61 | Temp 97.8°F | Wt 156.4 lb

## 2017-05-20 DIAGNOSIS — E890 Postprocedural hypothyroidism: Secondary | ICD-10-CM

## 2017-05-20 DIAGNOSIS — Z8639 Personal history of other endocrine, nutritional and metabolic disease: Secondary | ICD-10-CM

## 2017-05-20 DIAGNOSIS — Z23 Encounter for immunization: Secondary | ICD-10-CM

## 2017-05-20 DIAGNOSIS — M8088XS Other osteoporosis with current pathological fracture, vertebra(e), sequela: Secondary | ICD-10-CM

## 2017-05-20 DIAGNOSIS — M8008XS Age-related osteoporosis with current pathological fracture, vertebra(e), sequela: Secondary | ICD-10-CM

## 2017-05-20 MED ORDER — LEVOTHYROXINE SODIUM 75 MCG PO TABS: 75.0000 ug | ORAL_TABLET | Freq: Every day | ORAL | 3 refills | Status: DC

## 2017-05-20 NOTE — Addendum Note (Signed)
Addended by: Modena Nunnery on: 05/20/2017 03:13 PM   Modules accepted: Orders

## 2017-05-20 NOTE — Progress Notes (Signed)
Patient ID: [name], female   DOB: [birth year], 76 y.o.   MRN: [id number]   HPI  [name] is a 76 y.o.-year-old female, returning for f/u for h/o Graves ds and now postablative hypothyroidism. Last visit 1 year ago.  She fell backwards >> L4 compression fracture - on a brace now. She is seeing Dr. Marcelino Scot in [place]. She was on Reclast >> this year was her 3rd drug holiday year >> will see him in 06/2017.  Reviewed and addended history: Patient was admitted to the hospital 06/2013 with Takotsubo cardiomyopathy, and a low TSH was found incidentally. She had cardiac catheterization on 06/17/2013 (after blood for TSH was drawn). She was also found to have atrial fibrillation in the hospital, now resolved (was on Coreg in the past and continues now). The patient's EF also normalized before discharge.  She was scheduled to have a hospital followup appointment with her PCP. Dr. Regis Bill repeated a TSH level and added a free T4 and a free T3 and these returned abnormal, suggesting thyrotoxicosis. Of note, her pulse was in the 50s and she was not in atrial fibrillation at the time of the appointment. Patient was referred to endocrinology for for further management.  Thyroid Uptake and scan (12/28/2013): Markedly elevated 24 hr radio iodine uptake of 82% Normal thyroid scan. Findings consistent with Graves disease.  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  Pt is on levothyroxine 75 mcg daily, taken: - in am - fasting - at least 30 min from b'fast - no Fe, MVI, PPIs - + Ca 2x a day - L and D - on Biotin usually  I reviewed pt's thyroid tests: Lab Results  Component Value Date   TSH 3.110 04/09/2017   TSH 1.38 02/15/2016   TSH 1.24 08/22/2015   TSH 0.63 05/23/2015   TSH 0.84 11/22/2014   TSH 3.79 08/09/2014   TSH 17.05 (H) 07/05/2014   TSH 15.69 (H) 05/13/2014   TSH 6.03 (H) 04/15/2014   TSH 0.06 (L) 03/04/2014   FREET4 1.02  08/22/2015   FREET4 1.66 (H) 05/23/2015   FREET4 1.42 11/22/2014   FREET4 1.36 08/09/2014   FREET4 0.96 07/05/2014   FREET4 0.85 05/13/2014   FREET4 1.23 04/15/2014   FREET4 1.77 (H) 03/04/2014   FREET4 1.44 12/01/2013   FREET4 1.64 (H) 10/26/2013  fT3 on 06/26/2013: 6.4 (2.3 - 4.2)   Patient denies weight gain or weight loss, cold or heat intolerance, diarrhea or constipation, palpitations, tremors, depression/anxiety.  Pt denies: - feeling nodules in neck - hoarseness - dysphagia - choking - SOB with lying down  She also has a h/o hypertension, hyperlipidemia, tricuspid regurgitation, mitral valve prolapse, PMR.  She Continues carvedilol.   ROS: Constitutional: + see HPI Eyes: no blurry vision, no xerophthalmia ENT: no sore throat, + see HPI Cardiovascular: no CP/no SOB/no palpitations/no leg swelling Respiratory: no cough/no SOB/no wheezing Gastrointestinal: no N/no V/no D/no C/no acid reflux Musculoskeletal: no muscle aches/+ joint aches - back  Skin: no rashes, no hair loss Neurological: no tremors/no numbness/no tingling/no dizziness  I reviewed pt's medications, allergies, PMH, social hx, family hx, and changes were documented in the history of present illness. Otherwise, unchanged from my initial visit note.  PE: BP 114/62   Pulse 61   Temp 97.8 F (36.6 C) (Oral)   Wt 156 lb 6 oz (70.9 kg)   SpO2 95%   BMI 26.84 kg/m  Body mass index is 26.84  kg/m. Wt Readings from Last 3 Encounters:  05/20/17 156 lb 6 oz (70.9 kg)  04/05/17 158 lb 8 oz (71.9 kg)  08/09/16 157 lb (71.2 kg)  Body mass index is 26.84 kg/m.  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 H/o Graves ds  2. Postablative hypothyroidism  3. S/p L4 fracture  PLAN:  1. Patient with Graves ds., s/p RAI tx, now with postablative hypothyroidism - Continue beta blocker (Coreg) per cardiology - Chances of Graves recurrence are greatly reduced by becoming hypothyroid after RAI treatment - She is aware about Graves ophthalmopathy, and the fact that we cannot anticipate this complication, but she should let me know if she develops exophthalmos or pain with eye movements.  2. Postablative hypothyroidism - latest thyroid labs reviewed with pt >> normal last month - she continues on LT4 75 mcg daily - pt feels good on this dose. - we discussed about taking the thyroid hormone every day, with water, >30 minutes before breakfast, separated by >4 hours from acid reflux medications, calcium, iron, multivitamins. Pt. is taking it correctly - will check thyroid tests in 6 mo TSH and fT4, ut have her come back for another appt in 1 year  3. S/p L4 vb fracture - on 3rd year of Reclast drug holiday - pain started to subside - saw ortho and will see bone specialist in [place]  - she is thinking about Prolia - discussed benefits  + given flu shot today  Philemon Kingdom, MD PhD [facility]

## 2017-05-20 NOTE — Patient Instructions (Signed)
Please continue 75 mcg daily.  Take the thyroid hormone every day, with water, at least 30 minutes before breakfast, separated by at least 4 hours from: - acid reflux medications - calcium - iron - multivitamins  Please come back for labs in 6 months but do not take the Biotin for 5 days before lab draw.  Please come back for a follow-up appointment in 1 year.

## 2017-06-06 DIAGNOSIS — I10 Essential (primary) hypertension: Secondary | ICD-10-CM | POA: Diagnosis not present

## 2017-06-06 DIAGNOSIS — Z0000 Encounter for general adult medical examination without abnormal findings: Secondary | ICD-10-CM | POA: Diagnosis not present

## 2017-06-10 DIAGNOSIS — E032 Hypothyroidism due to medicaments and other exogenous substances: Secondary | ICD-10-CM | POA: Diagnosis not present

## 2017-06-10 DIAGNOSIS — M4120 Other idiopathic scoliosis, site unspecified: Secondary | ICD-10-CM | POA: Diagnosis not present

## 2017-06-10 DIAGNOSIS — S32049A Unspecified fracture of fourth lumbar vertebra, initial encounter for closed fracture: Secondary | ICD-10-CM | POA: Diagnosis not present

## 2017-06-10 DIAGNOSIS — Z6826 Body mass index (BMI) 26.0-26.9, adult: Secondary | ICD-10-CM | POA: Diagnosis not present

## 2017-06-10 DIAGNOSIS — I5181 Takotsubo syndrome: Secondary | ICD-10-CM | POA: Diagnosis not present

## 2017-06-10 DIAGNOSIS — E890 Postprocedural hypothyroidism: Secondary | ICD-10-CM | POA: Diagnosis not present

## 2017-06-10 DIAGNOSIS — I341 Nonrheumatic mitral (valve) prolapse: Secondary | ICD-10-CM | POA: Diagnosis not present

## 2017-06-10 DIAGNOSIS — M545 Low back pain: Secondary | ICD-10-CM | POA: Diagnosis not present

## 2017-06-10 DIAGNOSIS — M810 Age-related osteoporosis without current pathological fracture: Secondary | ICD-10-CM | POA: Diagnosis not present

## 2017-06-10 DIAGNOSIS — I2510 Atherosclerotic heart disease of native coronary artery without angina pectoris: Secondary | ICD-10-CM | POA: Diagnosis not present

## 2017-06-10 DIAGNOSIS — M353 Polymyalgia rheumatica: Secondary | ICD-10-CM | POA: Diagnosis not present

## 2017-06-10 DIAGNOSIS — Z0000 Encounter for general adult medical examination without abnormal findings: Secondary | ICD-10-CM | POA: Diagnosis not present

## 2017-06-10 DIAGNOSIS — M5416 Radiculopathy, lumbar region: Secondary | ICD-10-CM | POA: Diagnosis not present

## 2017-06-10 DIAGNOSIS — R600 Localized edema: Secondary | ICD-10-CM | POA: Diagnosis not present

## 2017-06-10 DIAGNOSIS — M4316 Spondylolisthesis, lumbar region: Secondary | ICD-10-CM | POA: Diagnosis not present

## 2017-06-10 DIAGNOSIS — I10 Essential (primary) hypertension: Secondary | ICD-10-CM | POA: Diagnosis not present

## 2017-06-12 DIAGNOSIS — Z6826 Body mass index (BMI) 26.0-26.9, adult: Secondary | ICD-10-CM | POA: Diagnosis not present

## 2017-06-12 DIAGNOSIS — M8008XD Age-related osteoporosis with current pathological fracture, vertebra(e), subsequent encounter for fracture with routine healing: Secondary | ICD-10-CM | POA: Diagnosis not present

## 2017-06-12 DIAGNOSIS — M180 Bilateral primary osteoarthritis of first carpometacarpal joints: Secondary | ICD-10-CM | POA: Diagnosis not present

## 2017-06-12 DIAGNOSIS — M810 Age-related osteoporosis without current pathological fracture: Secondary | ICD-10-CM | POA: Diagnosis not present

## 2017-06-26 ENCOUNTER — Other Ambulatory Visit: Payer: Self-pay | Admitting: *Deleted

## 2017-06-26 ENCOUNTER — Other Ambulatory Visit: Payer: Self-pay | Admitting: Rheumatology

## 2017-06-26 DIAGNOSIS — M8008XD Age-related osteoporosis with current pathological fracture, vertebra(e), subsequent encounter for fracture with routine healing: Secondary | ICD-10-CM

## 2017-07-01 ENCOUNTER — Ambulatory Visit
Admission: RE | Admit: 2017-07-01 | Discharge: 2017-07-01 | Disposition: A | Discharge status: Home / Self Care | Payer: PPO | Source: Ambulatory Visit | Attending: Rheumatology | Admitting: Rheumatology

## 2017-07-01 DIAGNOSIS — R102 Pelvic and perineal pain: Secondary | ICD-10-CM | POA: Diagnosis not present

## 2017-07-01 DIAGNOSIS — M8008XD Age-related osteoporosis with current pathological fracture, vertebra(e), subsequent encounter for fracture with routine healing: Secondary | ICD-10-CM

## 2017-07-01 MED ORDER — GADOBENATE DIMEGLUMINE 529 MG/ML IV SOLN
15.0000 mL | Freq: Once | INTRAVENOUS | Status: AC | PRN
  Administered 2017-07-01: 15 mL via INTRAVENOUS

## 2017-08-01 DIAGNOSIS — Z85828 Personal history of other malignant neoplasm of skin: Secondary | ICD-10-CM | POA: Diagnosis not present

## 2017-08-01 DIAGNOSIS — D2272 Melanocytic nevi of left lower limb, including hip: Secondary | ICD-10-CM | POA: Diagnosis not present

## 2017-08-01 DIAGNOSIS — D2262 Melanocytic nevi of left upper limb, including shoulder: Secondary | ICD-10-CM | POA: Diagnosis not present

## 2017-08-01 DIAGNOSIS — L814 Other melanin hyperpigmentation: Secondary | ICD-10-CM | POA: Diagnosis not present

## 2017-08-01 DIAGNOSIS — L718 Other rosacea: Secondary | ICD-10-CM | POA: Diagnosis not present

## 2017-08-01 DIAGNOSIS — D2271 Melanocytic nevi of right lower limb, including hip: Secondary | ICD-10-CM | POA: Diagnosis not present

## 2017-08-01 DIAGNOSIS — D1801 Hemangioma of skin and subcutaneous tissue: Secondary | ICD-10-CM | POA: Diagnosis not present

## 2017-08-01 DIAGNOSIS — L821 Other seborrheic keratosis: Secondary | ICD-10-CM | POA: Diagnosis not present

## 2017-08-01 DIAGNOSIS — L820 Inflamed seborrheic keratosis: Secondary | ICD-10-CM | POA: Diagnosis not present

## 2017-08-01 DIAGNOSIS — L720 Epidermal cyst: Secondary | ICD-10-CM | POA: Diagnosis not present

## 2017-08-07 DIAGNOSIS — M545 Low back pain: Secondary | ICD-10-CM | POA: Diagnosis not present

## 2017-08-07 DIAGNOSIS — M5416 Radiculopathy, lumbar region: Secondary | ICD-10-CM | POA: Diagnosis not present

## 2017-08-07 DIAGNOSIS — S32049A Unspecified fracture of fourth lumbar vertebra, initial encounter for closed fracture: Secondary | ICD-10-CM | POA: Diagnosis not present

## 2017-08-07 DIAGNOSIS — M4316 Spondylolisthesis, lumbar region: Secondary | ICD-10-CM | POA: Diagnosis not present

## 2017-08-07 DIAGNOSIS — M4120 Other idiopathic scoliosis, site unspecified: Secondary | ICD-10-CM | POA: Diagnosis not present

## 2017-08-14 ENCOUNTER — Other Ambulatory Visit: Payer: Self-pay | Admitting: Gynecology

## 2017-08-14 DIAGNOSIS — Z1231 Encounter for screening mammogram for malignant neoplasm of breast: Secondary | ICD-10-CM

## 2017-08-26 ENCOUNTER — Ambulatory Visit: Payer: PPO | Admitting: Gynecology

## 2017-08-26 ENCOUNTER — Encounter: Payer: Self-pay | Admitting: Gynecology

## 2017-08-26 VITALS — BP 124/76 | Ht 64.0 in | Wt 154.0 lb

## 2017-08-26 DIAGNOSIS — N952 Postmenopausal atrophic vaginitis: Secondary | ICD-10-CM

## 2017-08-26 DIAGNOSIS — Z01411 Encounter for gynecological examination (general) (routine) with abnormal findings: Secondary | ICD-10-CM | POA: Diagnosis not present

## 2017-08-26 DIAGNOSIS — M810 Age-related osteoporosis without current pathological fracture: Secondary | ICD-10-CM

## 2017-08-26 NOTE — Progress Notes (Signed)
    [name] 11/08/1940 [id number]        77 y.o.  G3P3001 for breast and pelvic exam.  Past medical history,surgical history, problem list, medications, allergies, family history and social history were all reviewed and documented as reviewed in the EPIC chart.  ROS:  Performed with pertinent positives and negatives included in the history, assessment and plan.   Additional significant findings : None   Exam: [name] assistant Vitals:   08/26/17 1558  BP: 124/76  Weight: 154 lb (69.9 kg)  Height: 5\' 4"  (1.626 m)   Body mass index is 26.43 kg/m.  General appearance:  Normal affect, orientation and appearance. Skin: Grossly normal HEENT: Without gross lesions.  No cervical or supraclavicular adenopathy. Thyroid normal.  Lungs:  Clear without wheezing, rales or rhonchi Cardiac: RR, without RMG Abdominal:  Soft, nontender, without masses, guarding, rebound, organomegaly or hernia Breasts:  Examined lying and sitting without masses, retractions, discharge or axillary adenopathy. Pelvic:  Ext, BUS, Vagina: With atrophic changes  Cervix: With atrophic changes  Uterus: Anteverted, normal size, shape and contour, midline and mobile nontender   Adnexa: Without masses or tenderness    Anus and perineum: Normal   Rectovaginal: Normal sphincter tone without palpated masses or tenderness.    Assessment/Plan:  77 y.o. G3P3001 female for breast and pelvic exam.   1. Postmenopausal/atrophic genital changes.  No significant hot flushes, night sweats, vaginal dryness or any bleeding.  Continue to monitor and report any issues or bleeding. 2. Osteoporosis.  DEXA 2018.  Had fall this past year with vertebral fracture.  Was recommended to initiate Forteo type medication but declined due to cost.  Is in the process of arranging for Prolia.  Is being cared for by Dr. Marcelino Scot in [place].  Will continue to follow-up with them in reference to this. 3. Pap smear 2012.  No Pap smear  done today.  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  We both agree to stop screening per current screening guidelines based on age. 4. Mammography coming due in February and she has this scheduled.  Breast exam normal today. 5. Colonoscopy 2012.  Repeat at their recommended interval. 6. Health maintenance.  No routine lab work done as patient does this elsewhere.  Follow-up 1 year, sooner as needed.   Anastasio Auerbach MD, 4:44 PM 08/26/2017

## 2017-08-26 NOTE — Patient Instructions (Signed)
Follow-up in 1 year, sooner as needed. 

## 2017-09-17 DIAGNOSIS — M810 Age-related osteoporosis without current pathological fracture: Secondary | ICD-10-CM | POA: Diagnosis not present

## 2017-09-19 ENCOUNTER — Ambulatory Visit
Admission: RE | Admit: 2017-09-19 | Discharge: 2017-09-19 | Disposition: A | Discharge status: Home / Self Care | Payer: PPO | Source: Ambulatory Visit | Attending: Gynecology | Admitting: Gynecology

## 2017-09-19 DIAGNOSIS — Z1231 Encounter for screening mammogram for malignant neoplasm of breast: Secondary | ICD-10-CM | POA: Diagnosis not present

## 2017-10-25 DIAGNOSIS — M25552 Pain in left hip: Secondary | ICD-10-CM | POA: Diagnosis not present

## 2017-10-25 DIAGNOSIS — M47816 Spondylosis without myelopathy or radiculopathy, lumbar region: Secondary | ICD-10-CM | POA: Diagnosis not present

## 2017-11-14 DIAGNOSIS — M5416 Radiculopathy, lumbar region: Secondary | ICD-10-CM | POA: Diagnosis not present

## 2017-11-18 ENCOUNTER — Other Ambulatory Visit (INDEPENDENT_AMBULATORY_CARE_PROVIDER_SITE_OTHER): Payer: PPO

## 2017-11-18 DIAGNOSIS — E890 Postprocedural hypothyroidism: Secondary | ICD-10-CM | POA: Diagnosis not present

## 2017-11-18 LAB — T4, FREE: Free T4: 1.02 ng/dL (ref 0.60–1.60)

## 2017-11-18 LAB — TSH: TSH: 1.08 u[IU]/mL (ref 0.35–4.50)

## 2018-01-01 DIAGNOSIS — M5416 Radiculopathy, lumbar region: Secondary | ICD-10-CM | POA: Diagnosis not present

## 2018-01-01 DIAGNOSIS — M545 Low back pain: Secondary | ICD-10-CM | POA: Diagnosis not present

## 2018-01-01 DIAGNOSIS — S32049A Unspecified fracture of fourth lumbar vertebra, initial encounter for closed fracture: Secondary | ICD-10-CM | POA: Diagnosis not present

## 2018-01-01 DIAGNOSIS — M4120 Other idiopathic scoliosis, site unspecified: Secondary | ICD-10-CM | POA: Diagnosis not present

## 2018-01-01 DIAGNOSIS — M4316 Spondylolisthesis, lumbar region: Secondary | ICD-10-CM | POA: Diagnosis not present

## 2018-01-06 ENCOUNTER — Other Ambulatory Visit: Payer: Self-pay | Admitting: Neurosurgery

## 2018-01-06 DIAGNOSIS — M5416 Radiculopathy, lumbar region: Secondary | ICD-10-CM

## 2018-01-12 ENCOUNTER — Ambulatory Visit
Admission: RE | Admit: 2018-01-12 | Discharge: 2018-01-12 | Disposition: A | Discharge status: Home / Self Care | Payer: PPO | Source: Ambulatory Visit | Attending: Neurosurgery | Admitting: Neurosurgery

## 2018-01-12 DIAGNOSIS — M5416 Radiculopathy, lumbar region: Secondary | ICD-10-CM

## 2018-01-12 DIAGNOSIS — M48061 Spinal stenosis, lumbar region without neurogenic claudication: Secondary | ICD-10-CM | POA: Diagnosis not present

## 2018-01-28 DIAGNOSIS — M810 Age-related osteoporosis without current pathological fracture: Secondary | ICD-10-CM | POA: Diagnosis not present

## 2018-01-28 DIAGNOSIS — M545 Low back pain: Secondary | ICD-10-CM | POA: Diagnosis not present

## 2018-01-28 DIAGNOSIS — M5431 Sciatica, right side: Secondary | ICD-10-CM | POA: Diagnosis not present

## 2018-02-05 DIAGNOSIS — M545 Low back pain: Secondary | ICD-10-CM | POA: Diagnosis not present

## 2018-02-05 DIAGNOSIS — M4316 Spondylolisthesis, lumbar region: Secondary | ICD-10-CM | POA: Diagnosis not present

## 2018-02-05 DIAGNOSIS — M4120 Other idiopathic scoliosis, site unspecified: Secondary | ICD-10-CM | POA: Diagnosis not present

## 2018-02-05 DIAGNOSIS — S32049A Unspecified fracture of fourth lumbar vertebra, initial encounter for closed fracture: Secondary | ICD-10-CM | POA: Diagnosis not present

## 2018-02-17 ENCOUNTER — Encounter: Payer: Self-pay | Admitting: Internal Medicine

## 2018-03-14 ENCOUNTER — Encounter: Payer: Self-pay | Admitting: Internal Medicine

## 2018-03-24 DIAGNOSIS — M810 Age-related osteoporosis without current pathological fracture: Secondary | ICD-10-CM | POA: Diagnosis not present

## 2018-04-10 ENCOUNTER — Encounter: Payer: PPO | Admitting: Internal Medicine

## 2018-04-15 ENCOUNTER — Other Ambulatory Visit: Payer: Self-pay | Admitting: Cardiology

## 2018-04-15 MED ORDER — ENALAPRIL MALEATE 20 MG PO TABS: 20.0000 mg | ORAL_TABLET | Freq: Two times a day (BID) | ORAL | 0 refills | Status: DC

## 2018-04-15 NOTE — Telephone Encounter (Signed)
°*  STAT* If patient is at the pharmacy, call can be transferred to refill team.   1. Which medications need to be refilled? (please list name of each medication and dose if known) Enalapril Maleate 20 mg  2. Which pharmacy/location (including street and city if local pharmacy) is medication to be sent to? [place] [phone] fax can be reach by fax  3. Do they need a 30 day or 90 day supply? [place]

## 2018-04-15 NOTE — Telephone Encounter (Signed)
Pt's medication was sent to pt's pharmacy as requested confirmation received.  °

## 2018-04-28 ENCOUNTER — Other Ambulatory Visit: Payer: Self-pay

## 2018-04-28 DIAGNOSIS — I5181 Takotsubo syndrome: Secondary | ICD-10-CM

## 2018-04-28 MED ORDER — AMLODIPINE BESYLATE 5 MG PO TABS: 5.0000 mg | ORAL_TABLET | Freq: Every day | ORAL | 3 refills | Status: DC

## 2018-05-01 ENCOUNTER — Ambulatory Visit (AMBULATORY_SURGERY_CENTER): Payer: Self-pay

## 2018-05-01 VITALS — Ht 64.0 in | Wt 151.6 lb

## 2018-05-01 DIAGNOSIS — Z1211 Encounter for screening for malignant neoplasm of colon: Secondary | ICD-10-CM

## 2018-05-01 NOTE — Progress Notes (Signed)
Denies allergies to eggs or soy products. Denies complication of anesthesia or sedation. Denies use of weight loss medication. Denies use of O2.   Emmi instructions declined.  

## 2018-05-05 ENCOUNTER — Encounter: Payer: Self-pay | Admitting: Internal Medicine

## 2018-05-12 DIAGNOSIS — M5126 Other intervertebral disc displacement, lumbar region: Secondary | ICD-10-CM | POA: Diagnosis not present

## 2018-05-12 DIAGNOSIS — M545 Low back pain: Secondary | ICD-10-CM | POA: Diagnosis not present

## 2018-05-12 DIAGNOSIS — M4120 Other idiopathic scoliosis, site unspecified: Secondary | ICD-10-CM | POA: Diagnosis not present

## 2018-05-12 DIAGNOSIS — M4316 Spondylolisthesis, lumbar region: Secondary | ICD-10-CM | POA: Diagnosis not present

## 2018-05-19 ENCOUNTER — Encounter: Payer: Self-pay | Admitting: Internal Medicine

## 2018-05-19 ENCOUNTER — Other Ambulatory Visit: Payer: Self-pay

## 2018-05-19 ENCOUNTER — Ambulatory Visit (AMBULATORY_SURGERY_CENTER): Payer: PPO | Admitting: Internal Medicine

## 2018-05-19 VITALS — BP 160/77 | HR 56 | Temp 98.0°F | Resp 10 | Ht 64.0 in | Wt 151.0 lb

## 2018-05-19 DIAGNOSIS — K219 Gastro-esophageal reflux disease without esophagitis: Secondary | ICD-10-CM | POA: Diagnosis not present

## 2018-05-19 DIAGNOSIS — I509 Heart failure, unspecified: Secondary | ICD-10-CM | POA: Diagnosis not present

## 2018-05-19 DIAGNOSIS — Z1211 Encounter for screening for malignant neoplasm of colon: Secondary | ICD-10-CM | POA: Diagnosis not present

## 2018-05-19 DIAGNOSIS — I4891 Unspecified atrial fibrillation: Secondary | ICD-10-CM | POA: Diagnosis not present

## 2018-05-19 DIAGNOSIS — Z86010 Personal history of colonic polyps: Secondary | ICD-10-CM | POA: Diagnosis not present

## 2018-05-19 DIAGNOSIS — I10 Essential (primary) hypertension: Secondary | ICD-10-CM | POA: Diagnosis not present

## 2018-05-19 DIAGNOSIS — I2510 Atherosclerotic heart disease of native coronary artery without angina pectoris: Secondary | ICD-10-CM | POA: Diagnosis not present

## 2018-05-19 MED ORDER — SODIUM CHLORIDE 0.9 % IV SOLN: 500.0000 mL | Freq: Once | INTRAVENOUS | Status: DC

## 2018-05-19 NOTE — Progress Notes (Signed)
Report to PACU, RN, vss, BBS= Clear.  

## 2018-05-19 NOTE — Patient Instructions (Addendum)
No polyps or signs of cancer.  You are on an as needed program now - no more routine colonoscopy or other colon cancer screening tests.  I appreciate the opportunity to care for you. Gatha Mayer, MD, FACG     YOU HAD AN ENDOSCOPIC PROCEDURE TODAY AT [facility]:   Refer to the procedure report that was given to you for any specific questions about what was found during the examination.  If the procedure report does not answer your questions, please call your gastroenterologist to clarify.  If you requested that your care partner not be given the details of your procedure findings, then the procedure report has been included in a sealed envelope for you to review at your convenience later.  YOU SHOULD EXPECT: Some feelings of bloating in the abdomen. Passage of more gas than usual.  Walking can help get rid of the air that was put into your GI tract during the procedure and reduce the bloating. If you had a lower endoscopy (such as a colonoscopy or flexible sigmoidoscopy) you may notice spotting of blood in your stool or on the toilet paper. If you underwent a bowel prep for your procedure, you may not have a normal bowel movement for a few days.  Please Note:  You might notice some irritation and congestion in your nose or some drainage.  This is from the oxygen used during your procedure.  There is no need for concern and it should clear up in a day or so.  SYMPTOMS TO REPORT IMMEDIATELY:   Following lower endoscopy (colonoscopy or flexible sigmoidoscopy):  Excessive amounts of blood in the stool  Significant tenderness or worsening of abdominal pains  Swelling of the abdomen that is new, acute  Fever of 100F or higher    For urgent or emergent issues, a gastroenterologist can be reached at any hour by calling [phone].   DIET:  We do recommend a small meal at first, but then you may proceed to your regular diet.  Drink plenty of fluids but you  should avoid alcoholic beverages for 24 hours.  ACTIVITY:  You should plan to take it easy for the rest of today and you should NOT DRIVE or use heavy machinery until tomorrow (because of the sedation medicines used during the test).    FOLLOW UP: Our staff will call the number listed on your records the next business day following your procedure to check on you and address any questions or concerns that you may have regarding the information given to you following your procedure. If we do not reach you, we will leave a message.  However, if you are feeling well and you are not experiencing any problems, there is no need to return our call.  We will assume that you have returned to your regular daily activities without incident.  If any biopsies were taken you will be contacted by phone or by letter within the next 1-3 weeks.  Please call us at [phone] if you have not heard about the biopsies in 3 weeks.    SIGNATURES/CONFIDENTIALITY: You and/or your care partner have signed paperwork which will be entered into your electronic medical record.  These signatures attest to the fact that that the information above on your After Visit Summary has been reviewed and is understood.  Full responsibility of the confidentiality of this discharge information lies with you and/or your care-partner.    You may resume your current medications today.  No repeat colonoscopy unless you are having symptoms. Please call if any questions or concerns.

## 2018-05-19 NOTE — Op Note (Signed)
[facility] Patient Name: [name] Procedure Date: 05/19/2018 11:11 AM MRN: [id number] Endoscopist: Gatha Mayer , MD Age: 77 Referring MD:  Date of Birth: January 23, 1941 Gender: Female Account #: [id number] Procedure:                Colonoscopy Indications:              High risk colon cancer surveillance: Personal                            history of colonic polyps Medicines:                Propofol per Anesthesia, Monitored Anesthesia Care Procedure:                Pre-Anesthesia Assessment:                           - Prior to the procedure, a History and Physical                            was performed, and patient medications and                            allergies were reviewed. The patient's tolerance of                            previous anesthesia was also reviewed. The risks                            and benefits of the procedure and the sedation                            options and risks were discussed with the patient.                            All questions were answered, and informed consent                            was obtained. Prior Anticoagulants: The patient has                            taken no previous anticoagulant or antiplatelet                            agents. ASA Grade Assessment: III - A patient with                            severe systemic disease. After reviewing the risks                            and benefits, the patient was deemed in                            satisfactory condition to undergo the procedure.  After obtaining informed consent, the colonoscope                            was passed under direct vision. Throughout the                            procedure, the patient's blood pressure, pulse, and                            oxygen saturations were monitored continuously. The                            Model CF-HQ190L [id number]) scope was introduced   through the anus and advanced to the the cecum,                            identified by appendiceal orifice and ileocecal                            valve. The colonoscopy was somewhat difficult due                            to a redundant colon and significant looping.                            Successful completion of the procedure was aided by                            straightening and shortening the scope to obtain                            bowel loop reduction and applying abdominal                            pressure. The patient tolerated the procedure well.                            The quality of the bowel preparation was excellent.                            The ileocecal valve, appendiceal orifice, and                            rectum were photographed. The bowel preparation                            used was Miralax. Scope In: 11:19:46 AM Scope Out: 11:34:59 AM Scope Withdrawal Time: 0 hours 8 minutes 18 seconds  Total Procedure Duration: 0 hours 15 minutes 13 seconds  Findings:                 The perianal and digital rectal examinations were                            normal.  The colon (entire examined portion) appeared normal.                           No additional abnormalities were found on                            retroflexion. Complications:            No immediate complications. Estimated Blood Loss:     Estimated blood loss: none. Impression:               - The entire examined colon is normal. She has a                            remote hx of adenoma removal by Dr. Velora Heckler                           - No specimens collected. Recommendation:           - Patient has a contact number available for                            emergencies. The signs and symptoms of potential                            delayed complications were discussed with the                            patient. Return to normal activities tomorrow.                             Written discharge instructions were provided to the                            patient.                           - Resume previous diet.                           - Continue present medications.                           - No repeat colonoscopy due to age and the absence                            of colonic polyps. Gatha Mayer, MD 05/19/2018 11:40:51 AM This report has been signed electronically.

## 2018-05-19 NOTE — Progress Notes (Signed)
No problems noted in the recovery room. maw 

## 2018-05-19 NOTE — Progress Notes (Signed)
Pt's states no medical or surgical changes since previsit or office visit. 

## 2018-05-20 ENCOUNTER — Telehealth: Payer: Self-pay

## 2018-05-20 NOTE — Telephone Encounter (Signed)
  Follow up Call-  Call back number 05/19/2018  Post procedure Call Back phone  # [phone]  Permission to leave phone message Yes  Some recent data might be hidden     Patient questions:  Do you have a fever, pain , or abdominal swelling? No. Pain Score  0 *  Have you tolerated food without any problems? Yes.    Have you been able to return to your normal activities? Yes.    Do you have any questions about your discharge instructions: Diet   No. Medications  No. Follow up visit  No.  Do you have questions or concerns about your Care? No.  Actions: * If pain score is 4 or above: No action needed, pain <4. Patient verbalize having loose stools, last night. I told that it would take about 2-3 days  for her stools to return back to normal.

## 2018-05-21 ENCOUNTER — Telehealth: Payer: Self-pay

## 2018-05-21 DIAGNOSIS — I5181 Takotsubo syndrome: Secondary | ICD-10-CM

## 2018-05-21 DIAGNOSIS — I480 Paroxysmal atrial fibrillation: Secondary | ICD-10-CM

## 2018-05-21 MED ORDER — HYDROCHLOROTHIAZIDE 25 MG PO TABS: 25.0000 mg | ORAL_TABLET | Freq: Every day | ORAL | 3 refills | Status: DC

## 2018-05-21 NOTE — Telephone Encounter (Signed)
Reordered HCTZ

## 2018-05-22 ENCOUNTER — Ambulatory Visit: Payer: PPO | Admitting: Internal Medicine

## 2018-05-22 ENCOUNTER — Encounter: Payer: Self-pay | Admitting: Internal Medicine

## 2018-05-22 VITALS — BP 148/80 | HR 58 | Ht 64.0 in | Wt 151.0 lb

## 2018-05-22 DIAGNOSIS — E890 Postprocedural hypothyroidism: Secondary | ICD-10-CM

## 2018-05-22 DIAGNOSIS — Z8639 Personal history of other endocrine, nutritional and metabolic disease: Secondary | ICD-10-CM

## 2018-05-22 DIAGNOSIS — Z23 Encounter for immunization: Secondary | ICD-10-CM

## 2018-05-22 LAB — T4, FREE: Free T4: 1.2 ng/dL (ref 0.60–1.60)

## 2018-05-22 LAB — TSH: TSH: 0.53 u[IU]/mL (ref 0.35–4.50)

## 2018-05-22 MED ORDER — DENOSUMAB 60 MG/ML ~~LOC~~ SOSY: 60.0000 mg | PREFILLED_SYRINGE | Freq: Once | SUBCUTANEOUS | 0 refills | Status: AC

## 2018-05-22 NOTE — Progress Notes (Signed)
Patient ID: [name], female   DOB: [birth year], 77 y.o.   MRN: [id number]   HPI  [name] is a 77 y.o.-year-old female, returning for f/u for h/o Graves ds and now postablative hypothyroidism. Last visit 1 year ago  Reviewed and addended history: Patient was admitted to the hospital 06/2013 with Takotsubo cardiomyopathy, and a low TSH was found incidentally. She had cardiac catheterization on 06/17/2013 (after blood for TSH was drawn). She was also found to have atrial fibrillation in the hospital, now resolved (was on Coreg in the past and continues now). The patient's EF also normalized before discharge.  She was scheduled to have a hospital followup appointment with her PCP. Dr. Regis Bill repeated a TSH level and added a free T4 and a free T3 and these returned abnormal, suggesting thyrotoxicosis. Of note, her pulse was in the 50s and she was not in atrial fibrillation at the time of the appointment. Patient was referred to endocrinology for for further management.  Thyroid Uptake and scan (12/28/2013): Markedly elevated 24 hr radio iodine uptake of 82% Normal thyroid scan. Findings consistent with Graves disease.  Pt was initially on MMI, then had RAI Tx 01/26/2014 >> developed post ablative hypothyroidism  Pt is on levothyroxine  75 mcg daily, taken: - in am - fasting - at least 30 min from b'fast - no Fe, MVI, PPIs - + Calcium with lunch and dinner - off Biotin for last 5 days now  Reviewed her TFTs, which remain normal: Lab Results  Component Value Date   TSH 1.08 11/18/2017   TSH 3.110 04/09/2017   TSH 1.38 02/15/2016   TSH 1.24 08/22/2015   TSH 0.63 05/23/2015   TSH 0.84 11/22/2014   TSH 3.79 08/09/2014   TSH 17.05 (H) 07/05/2014   TSH 15.69 (H) 05/13/2014   TSH 6.03 (H) 04/15/2014   FREET4 1.02 11/18/2017   FREET4 1.02 08/22/2015   FREET4 1.66 (H) 05/23/2015   FREET4 1.42 11/22/2014   FREET4 1.36 08/09/2014   FREET4 0.96 07/05/2014   FREET4 0.85  05/13/2014   FREET4 1.23 04/15/2014   FREET4 1.77 (H) 03/04/2014   FREET4 1.44 12/01/2013  fT3 on 06/26/2013: 6.4 (2.3 - 4.2)   Pt denies: - feeling nodules in neck - hoarseness - dysphagia - choking - SOB with lying down  She also has a h/o hypertension, hyperlipidemia, tricuspid regurgitation, mitral valve prolapse, PMR.  She continues carvedilol.  She has a h/o L4 burst compression fx >> Gabapentin. On Prolia x2.  ROS: Constitutional: no weight gain/no weight loss, no fatigue, no subjective hyperthermia, no subjective hypothermia Eyes: no blurry vision, no xerophthalmia ENT: no sore throat, + see HPI Cardiovascular: no CP/no SOB/no palpitations/no leg swelling Respiratory: no cough/no SOB/no wheezing Gastrointestinal: no N/no V/no D/no C/no acid reflux Musculoskeletal: no muscle aches/no joint aches Skin: no rashes, no hair loss Neurological: no tremors/no numbness/no tingling/no dizziness  I reviewed pt's medications, allergies, PMH, social hx, family hx, and changes were documented in the history of present illness. Otherwise, unchanged from my initial visit note.  Past Medical History:  Diagnosis Date  . Abnormal EKG   . Atrial fibrillation ([place])   . Basal cell carcinoma of nose    removed w/MOHs  . CAD (coronary artery disease)    a. NSTEMI 06/2013 => LHC:  mLAD 40, oD2 70 (small), pOM 20, mRCA 20, mid to dist ant HK, EF 30-35% (c/w Tako-Tsubo CM)  . Cataract   . Chronic cystitis   .  CIN I (cervical intraepithelial neoplasia I)   . Dyslipidemia   . Ejection fraction    EF 65%, echo, 2011  . Heart murmur    MVP   . Hemorrhoids   . History of colon polyps   . HTN (hypertension)   . Hx of colonoscopy   . Hyperlipidemia   . Hyponatremia   . 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 IBS (irritable bowel syndrome)   . Lumbar vertebral fracture (H[facility])   . Mitral valve prolapse    echo, January, 2011, moderate prolapse with your  leaflet, trivial MR   Antibiotic required for procedures  . MVP (mitral valve prolapse)   . Orthostasis    Mild orthostatic change when she stands  . Osteoarthritis    "do not have RA" (06/17/2013)  . Osteoporosis 01/2014   T score -2.3 followed by Dr. Marcelino Scot  . Polymyalgia rheumatica ([place])   . PONV (postoperative nausea and vomiting)   . Potassium (K) deficiency    5 potassium requirement over time  . 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 Syncope    August, 200 weight, dehydration  . 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 EF 30-35% at [facility] 06/2013;  b.  f/u Echo (06/18/13):  EF 60-65%, normal wall motion, Gr 1 DD, mild MVP of post leaflet, mod TR, PASP 63  . Thyroid disease   . Tricuspid regurgitation    mild to moderate, echo, January, 2011, PA pressure 38 mm mercury   Past Surgical History:  Procedure Laterality Date  . CARDIAC CATHETERIZATION  06/17/2013  . COLONOSCOPY  12/23/2003   normal (indications: prior adenomas and grandfather with colon cancer)  . COLPOSCOPY    . EXCISIONAL HEMORRHOIDECTOMY  2003  . LEFT HEART CATHETERIZATION WITH CORONARY ANGIOGRAM N/A 06/17/2013   Procedure: LEFT HEART CATHETERIZATION WITH CORONARY ANGIOGRAM;  Surgeon: Peter M Martinique, MD;  Location: [facility] CATH LAB;  Service: Cardiovascular;  Laterality: N/A;  . MOHS SURGERY Left 2011   "side of my nose" (06/17/2013)  . TONSILLECTOMY    . TOTAL KNEE ARTHROPLASTY  08/2010  . TOTAL KNEE ARTHROPLASTY  02/25/2012   Procedure: TOTAL KNEE ARTHROPLASTY;  Surgeon: Gearlean Alf, MD;  Location: [facility] ORS;  Service: Orthopedics;  Laterality: Right;   Social History   Socioeconomic History  . Marital status: Married    Spouse name: Not on file  . Number of children: 1  . Years of education: Not on file  . Highest education level: Not on file  Occupational History  . Occupation: Agricultural engineer  Social Needs  . Financial resource strain: Not on file  . Food  insecurity:    Worry: Not on file    Inability: Not on file  . Transportation needs:    Medical: Not on file    Non-medical: Not on file  Tobacco Use  . Smoking status: Never Smoker  . Smokeless tobacco: Never Used  Substance and Sexual Activity  . Alcohol use: No    Alcohol/week: 0.0 standard drinks  . Drug use: No  . Sexual activity: Never    Birth control/protection: Post-menopausal    Comment: 1st intercourse 77 yo-Fewer than 5 partners  Lifestyle  . Physical activity:    Days per week: Not on file    Minutes per session: Not on file  . Stress: Not on file  Relationships  . Social connections:    Talks on phone:  Not on file    Gets together: Not on file    Attends religious service: Not on file    Active member of club or organization: Not on file    Attends meetings of clubs or organizations: Not on file    Relationship status: Not on file  . Intimate partner violence:    Fear of current or ex partner: Not on file    Emotionally abused: Not on file    Physically abused: Not on file    Forced sexual activity: Not on file  Other Topics Concern  . Not on file  Social History Narrative   3 caffeine drinks daily    Married   Retired         Current Outpatient Medications on File Prior to Visit  Medication Sig Dispense Refill  . amitriptyline (ELAVIL) 25 MG tablet Take 25 mg by mouth at bedtime.     Marland Kitchen amLODipine (NORVASC) 5 MG tablet Take 1 tablet (5 mg total) by mouth daily with supper. 90 tablet 3  . atorvastatin (LIPITOR) 10 MG tablet Take 1 tablet by mouth every day with breakfast. 90 tablet 3  . Biotin 5000 MCG CAPS Take 1 capsule by mouth daily.     . Calcium Carbonate (CALTRATE 600 PO) Take 1 tablet by mouth daily.     . carvedilol (COREG) 3.125 MG tablet Take 1 tablet (3.125 mg total) by mouth 2 (two) times daily. 180 tablet 3  . Cholecalciferol (D3-1000) 1000 UNITS capsule Take 1,000 Units by mouth daily.    . diclofenac sodium (VOLTAREN) 1 % GEL 1  application.  2  . enalapril (VASOTEC) 20 MG tablet Take 1 tablet (20 mg total) by mouth 2 (two) times daily. Please make overdue yearly appt with Dr.Nelson before anymore refills. 1st attempt 60 tablet 0  . EVENING PRIMROSE OIL PO Take 1 capsule by mouth daily.     Marland Kitchen gabapentin (NEURONTIN) 100 MG capsule Take 100 capsules by mouth 3 (three) times daily.  3  . hydrochlorothiazide (HYDRODIURIL) 25 MG tablet Take 1 tablet (25 mg total) by mouth daily. 90 tablet 3  . levothyroxine (SYNTHROID, LEVOTHROID) 75 MCG tablet Take 1 tablet (75 mcg total) by mouth daily. 90 tablet 3  . meloxicam (MOBIC) 15 MG tablet Take 15 mg by mouth daily.    . methylcellulose packet Take 1 each by mouth daily. CITRICEL    . pantoprazole (PROTONIX) 40 MG tablet Take 1 tablet by mouth daily.     No current facility-administered medications on file prior to visit.    No Known Allergies Family History  Problem Relation Age of Onset  . Heart attack Mother 81  . Hypertension Mother   . Heart disease Mother   . Stroke Mother   . Heart attack Father 18  . Coronary artery disease Father        Multiple Family Members  . Hypertension Father   . Heart disease Father   . Esophageal cancer Paternal Grandmother   . Colon cancer Maternal Grandfather   . Heart disease Maternal Grandfather   . Irritable bowel syndrome Sister        More family members on father side of   . Hypertension Sister   . Heart disease Sister   . Breast cancer Sister   . Breast cancer Paternal Aunt        Age 43's  . Heart disease Paternal Grandfather   . Rectal cancer Neg Hx   . Stomach cancer  Neg Hx     PE: BP (!) 148/80   Pulse (!) 58   Ht 5\' 4"  (1.626 m)   Wt 151 lb (68.5 kg)   SpO2 96%   BMI 25.92 kg/m  Body mass index is 25.92 kg/m. Wt Readings from Last 3 Encounters:  05/22/18 151 lb (68.5 kg)  05/19/18 151 lb (68.5 kg)  05/01/18 151 lb 9.6 oz (68.8 kg)   Constitutional: Normal weight, in NAD Eyes: PERRLA, EOMI, no  exophthalmos ENT: moist mucous membranes, no thyromegaly, no cervical lymphadenopathy Cardiovascular: RRR, No MRG Respiratory: CTA B Gastrointestinal: abdomen soft, NT, ND, BS+ Musculoskeletal: no deformities, strength intact in all 4 Skin: moist, warm, no rashes Neurological: no tremor with outstretched hands, could not elicit DTRs (s/p B TKR)  ASSESSMENT: 1. H/o Graves ds  2. Postablative hypothyroidism  3. S/p L4 fracture - She fell backwards >> L4 compression fracture.  - She was on Reclast >> this year was her fourth drug holiday year. - Managed by Dr. Marcelino Scot in [place] - Discussed pros and cons of Prolia at last visit >> started  - had 2 doses _ no SEs.  PLAN:  1. Patient with history of Graves' disease, status post RAI treatment, now with post ablative hypothyroidism - Continue beta-blocker (Coreg) per cardiology - No signs of Graves' ophthalmopathy, but we discussed that she needs to let me know if she develops exophthalmos, pain with eye movements or double vision. - No need to check her TSI antibodies now.  2. Postablative hypothyroidism - latest thyroid labs reviewed with pt >> normal 12/2017 - she continues on LT4 75 mcg daily - pt feels good on this dose. - we discussed about taking the thyroid hormone every day, with water, >30 minutes before breakfast, separated by >4 hours from acid reflux medications, calcium, iron, multivitamins. Pt. is taking it correctly. - will check thyroid tests today: TSH and fT4 - If labs are abnormal, she will need to return for repeat TFTs in 1.5 months - will repeat these in 6 mo and I will see her back in 1 year  Given flu shot today.  Needs refills.  - time spent with the patient: 25 min of which >50% was spent in obtaining information about her symptoms, reviewing her previous labs, evaluations, and treatments, counseling her about her condition (please see the discussed topics above), and developing a plan to further  investigate and treat it; she had several number of questions which I addressed.  Office Visit on 05/22/2018  Component Date Value Ref Range Status  . TSH 05/22/2018 0.53  0.35 - 4.50 uIU/mL Final  . Free T4 05/22/2018 1.20  0.60 - 1.60 ng/dL Final   Comment: Specimens from patients who are undergoing biotin therapy and /or ingesting biotin supplements may contain high levels of biotin.  The higher biotin concentration in these specimens interferes with this Free T4 assay.  Specimens that contain high levels  of biotin may cause false high results for this Free T4 assay.  Please interpret results in light of the total clinical presentation of the patient.     Normal TFTs.  Philemon Kingdom, MD PhD [facility] Endocrinology

## 2018-05-22 NOTE — Patient Instructions (Addendum)
Please continue Levothyroxine 75 mcg daily.  Take the thyroid hormone every day, with water, at least 30 minutes before breakfast, separated by at least 4 hours from: - acid reflux medications - calcium - iron - multivitamins  Please come back for labs in 6 months but do not take the Biotin for 5 days before lab draw.  Please come back for a follow-up appointment in 1 year.

## 2018-05-23 MED ORDER — LEVOTHYROXINE SODIUM 75 MCG PO TABS: 75.0000 ug | ORAL_TABLET | Freq: Every day | ORAL | 3 refills | Status: DC

## 2018-05-27 ENCOUNTER — Ambulatory Visit: Payer: PPO | Admitting: Physician Assistant

## 2018-06-02 ENCOUNTER — Ambulatory Visit: Payer: PPO | Admitting: Cardiology

## 2018-06-02 ENCOUNTER — Encounter: Payer: Self-pay | Admitting: Cardiology

## 2018-06-02 VITALS — BP 132/78 | HR 54 | Ht 64.0 in | Wt 152.0 lb

## 2018-06-02 DIAGNOSIS — I10 Essential (primary) hypertension: Secondary | ICD-10-CM

## 2018-06-02 DIAGNOSIS — E782 Mixed hyperlipidemia: Secondary | ICD-10-CM

## 2018-06-02 DIAGNOSIS — I480 Paroxysmal atrial fibrillation: Secondary | ICD-10-CM

## 2018-06-02 DIAGNOSIS — E785 Hyperlipidemia, unspecified: Secondary | ICD-10-CM

## 2018-06-02 NOTE — Progress Notes (Signed)
06/02/2018 [name]   1940-12-04  [id number]  Primary Physician Deland Pretty, MD Primary Cardiologist: Dr. Meda Coffee  Electrophysiologist: none   Reason for Visit/CC: f/u given h/o afib and Tako-tsubo CMP  HPI:  [name] is a 77 y.o. female, followed by Dr. Meda Coffee, who presents for routien follow up given history of parox a-fib and h/o Tako-tsubo CMP induced by Graves disease. She was treated with radioactive iodine and stable since then. Her LVEF has improved. Echocardiogram in 2014 normal LVEF and severe pulmonary HTN with RVSP 63 mmHg. Repeat echo in 2016 showed improvement in PA pressure to normal. She also has MVP and mild MR. She is the primary caregiver for her husband, who is also a pt of Dr. Francesca Oman. He has Parkinson's disease. It is a lot of stress for her.   She is here today for her yearly f/u. She is doing well. No cardiac symptoms. She denies CP, DOE, palpitations, syncope. Also denies LEE, orthopnea and PND.  Fully complaint with her meds but admits that she is not always mindful of her diet. She eats whatever she wants. Likes salt. Her BP today is 132/78. EKG shows Sinus brady, 54 bpm. Asymptomatic with her bradycardia.    Cardiac Studies  Coronary angiography 2014: Coronary dominance: right  Left mainstem: Normal.  Left anterior descending (LAD): The LAD is mildly calcified in the proximal and mid vessel. In the mid vessel there is a 40% stenosis at the takeoff of the second diagonal. The second diagonal is relatively small with a 70% ostial stenosis.  Left circumflex (LCx): The left circumflex gives rise to a single large branching marginal branch. There is 20% disease in the proximal OM.  Right coronary artery (RCA): The right coronary is a large dominant vessel. It has mild disease in the mid vessel up to 20%.  Left ventriculography: Left ventricular systolic function is abnormal, there is severe hypokinesis of the mid to distal anterior  wall, mid to distal inferior wall, and apex. LVEF is estimated at 30-35% , there is no significant mitral regurgitation   Final Conclusions:   1. Nonobstructive coronary disease. 2. Cardiomyopathy with severe left ventricular dysfunction. 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  Recommendations: Medical management. Continue beta blocker therapy and ACE inhibitor therapy. We'll cycle cardiac enzymes. Check echocardiogram.   2D Echo 2016 Study Conclusions  - Left ventricle: The cavity size was normal. Wall thickness was   normal. Systolic function was normal. The estimated ejection   fraction was in the range of 60% to 65%. Doppler parameters are   consistent with abnormal left ventricular relaxation (grade 1   diastolic dysfunction). - Mitral valve: Moderate, late systolicprolapse, involving the   middle scallop of the posterior leaflet. There was mild   regurgitation directed eccentrically and toward the septum. - Tricuspid valve: There was mild-moderate regurgitation directed   centrally.  No outpatient medications have been marked as taking for the 06/02/18 encounter (Appointment) with Consuelo Pandy, PA-C.   No Known Allergies Past Medical History:  Diagnosis Date  . Abnormal EKG   . Atrial fibrillation ([address])   . Basal cell carcinoma of nose    removed w/MOHs  . CAD (coronary artery disease)    a. NSTEMI 06/2013 => LHC:  mLAD 40, oD2 70 (small), pOM 20, mRCA 20, mid to dist ant HK, EF 30-35% (c/w Tako-Tsubo CM)  . Cataract   . Chronic cystitis   . CIN I (cervical intraepithelial neoplasia  I)   . Dyslipidemia   . Ejection fraction    EF 65%, echo, 2011  . Heart murmur    MVP   . Hemorrhoids   . History of colon polyps   . HTN (hypertension)   . Hx of colonoscopy   . Hyperlipidemia   . Hyponatremia   . 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 IBS (irritable bowel syndrome)   . Lumbar vertebral  fracture (HCC)   . Mitral valve prolapse    echo, January, 2011, moderate prolapse with your leaflet, trivial MR   Antibiotic required for procedures  . MVP (mitral valve prolapse)   . Orthostasis    Mild orthostatic change when she stands  . Osteoarthritis    "do not have RA" (06/17/2013)  . Osteoporosis 01/2014   T score -2.3 followed by Dr. Marcelino Scot  . Polymyalgia rheumatica ([place])   . PONV (postoperative nausea and vomiting)   . Potassium (K) deficiency    5 potassium requirement over time  . 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 Syncope    August, 200 weight, dehydration  . 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 EF 30-35% at [facility] 06/2013;  b.  f/u Echo (06/18/13):  EF 60-65%, normal wall motion, Gr 1 DD, mild MVP of post leaflet, mod TR, PASP 63  . Thyroid disease   . Tricuspid regurgitation    mild to moderate, echo, January, 2011, PA pressure 38 mm mercury   Family History  Problem Relation Age of Onset  . Heart attack Mother 46  . Hypertension Mother   . Heart disease Mother   . Stroke Mother   . Heart attack Father 69  . Coronary artery disease Father        Multiple Family Members  . Hypertension Father   . Heart disease Father   . Esophageal cancer Paternal Grandmother   . Colon cancer Maternal Grandfather   . Heart disease Maternal Grandfather   . Irritable bowel syndrome Sister        More family members on father side of   . Hypertension Sister   . Heart disease Sister   . Breast cancer Sister   . Breast cancer Paternal Aunt        Age 36's  . Heart disease Paternal Grandfather   . Rectal cancer Neg Hx   . Stomach cancer Neg Hx    Past Surgical History:  Procedure Laterality Date  . CARDIAC CATHETERIZATION  06/17/2013  . COLONOSCOPY  12/23/2003   normal (indications: prior adenomas and grandfather with colon cancer)  . COLPOSCOPY    . EXCISIONAL HEMORRHOIDECTOMY  2003  . LEFT HEART CATHETERIZATION WITH  CORONARY ANGIOGRAM N/A 06/17/2013   Procedure: LEFT HEART CATHETERIZATION WITH CORONARY ANGIOGRAM;  Surgeon: Peter M Martinique, MD;  Location: [facility] CATH LAB;  Service: Cardiovascular;  Laterality: N/A;  . MOHS SURGERY Left 2011   "side of my nose" (06/17/2013)  . TONSILLECTOMY    . TOTAL KNEE ARTHROPLASTY  08/2010  . TOTAL KNEE ARTHROPLASTY  02/25/2012   Procedure: TOTAL KNEE ARTHROPLASTY;  Surgeon: Gearlean Alf, MD;  Location: [facility];  Service: Orthopedics;  Laterality: Right;   Social History   Socioeconomic History  . Marital status: Married    Spouse name: Not on file  . Number of children: 1  . Years of education: Not on file  . Highest education level: Not on file  Occupational History  . Occupation: Agricultural engineer  Social Needs  . Financial resource strain: Not on file  . Food insecurity:    Worry: Not on file    Inability: Not on file  . Transportation needs:    Medical: Not on file    Non-medical: Not on file  Tobacco Use  . Smoking status: Never Smoker  . Smokeless tobacco: Never Used  Substance and Sexual Activity  . Alcohol use: No    Alcohol/week: 0.0 standard drinks  . Drug use: No  . Sexual activity: Never    Birth control/protection: Post-menopausal    Comment: 1st intercourse 77 yo-Fewer than 5 partners  Lifestyle  . Physical activity:    Days per week: Not on file    Minutes per session: Not on file  . Stress: Not on file  Relationships  . Social connections:    Talks on phone: Not on file    Gets together: Not on file    Attends religious service: Not on file    Active member of club or organization: Not on file    Attends meetings of clubs or organizations: Not on file    Relationship status: Not on file  . Intimate partner violence:    Fear of current or ex partner: Not on file    Emotionally abused: Not on file    Physically abused: Not on file    Forced sexual activity: Not on file  Other Topics Concern  . Not on file  Social History Narrative    3 caffeine drinks daily    Married   Retired           Review of Systems: General: negative for chills, fever, night sweats or weight changes.  Cardiovascular: negative for chest pain, dyspnea on exertion, edema, orthopnea, palpitations, paroxysmal nocturnal dyspnea or shortness of breath Dermatological: negative for rash Respiratory: negative for cough or wheezing Urologic: negative for hematuria Abdominal: negative for nausea, vomiting, diarrhea, bright red blood per rectum, melena, or hematemesis Neurologic: negative for visual changes, syncope, or dizziness All other systems reviewed and are otherwise negative except as noted above.   Physical Exam:  There were no vitals taken for this visit.  General appearance: alert, cooperative and no distress Neck: no carotid bruit and no JVD Lungs: clear to auscultation bilaterally Heart: regular rate and rhythm, S1, S2 normal, no murmur, click, rub or gallop Extremities: extremities normal, atraumatic, no cyanosis or edema Pulses: 2+ and symmetric Skin: Skin color, texture, turgor normal. No rashes or lesions Neurologic: Grossly normal  EKG NSR 54 bpm -- personally reviewed   ASSESSMENT AND PLAN:   1. Atrial fibrillation: She had limited atrial fibrillation while she was hyperthyroid. She is euthyroid now. She has had no known recurrence of afib to date and no symptoms. She is no longer on a/c. Dr. Meda Coffee previously discussed adding low dose aspirin as a stroke prevention, however she is taking Mobic and also has gastric reflux so decision was made not to initiate given concerns for further irritation/GIB. She will notify us if any symptoms of afib. EKG today shows SR. HR in the 50s on BB.   2. H/o graves disease: TSH normalized after she was treated with radioactive iodine. This has been followed by an endocrinologist and she has remained stable.   3. Takotsubo CMP:  Patient was hospitalized in November, 2014. Catheterization  revealed no significant coronary disease. Wall motion abnormalities suggested Takot-subo. Her LV function normalized very rapidly within a few  days. She has done well since. Euvolemic on exam. No dyspnea or LEE. BP and HR well controlled w/ BB. Also on ACEi therapy.   4. Pulmonary HTN: noted on echo in 2014 however improved on echo in 2016, PA pressure was 25 mmHg.   5. Hyperlipidemia: on statin therapy w/ atorvastatin. Lipid panel last year showed controlled LDL at 65 mg/dL.  will repeat FLP.   6. HTN: controlled on current regimen. Will check BMP given she is on an ACE and thiazide diuretic.   7. MVP/MR: mild MR noted on echo in 2016. She denies CP and no significant dyspnea. She has chronic stable dyspnea if she walks up very steep hills, but no change in her baseline.   Follow-Up w/ Dr. Meda Coffee in 1 year.   Abrial Arrighi Ladoris Gene, MHS [facility] 06/02/2018 1:22 PM

## 2018-06-02 NOTE — Patient Instructions (Addendum)
Medication Instructions:  none If you need a refill on your cardiac medications before your next appointment, please call your pharmacy.   Lab work: 10/29 BMP LIPID PROFILE HEPATIC FUNCTION If you have labs (blood work) drawn today and your tests are completely normal, you will receive your results only by: Marland Kitchen MyChart Message (if you have MyChart) OR . A paper copy in the mail If you have any lab test that is abnormal or we need to change your treatment, we will call you to review the results.  Testing/Procedures: NONE  Follow-Up: At [facility], you and your health needs are our priority.  As part of our continuing mission to provide you with exceptional heart care, we have created designated Provider Care Teams.  These Care Teams include your primary Cardiologist (physician) and Advanced Practice Providers (APPs -  Physician Assistants and Nurse Practitioners) who all work together to provide you with the care you need, when you need it. You will need a follow up appointment in 1 years.  Please call our office 2 months in advance to schedule this appointment.  You may see Dr Meda Coffee or one of the following Advanced Practice Providers on your designated Care Team:   [place], PA-C Melina Copa, PA-C . Ermalinda Barrios, PA-C  Any Other Special Instructions Will Be Listed Below (If Applicable).

## 2018-06-03 ENCOUNTER — Other Ambulatory Visit: Payer: Self-pay | Admitting: Cardiology

## 2018-06-03 ENCOUNTER — Other Ambulatory Visit: Payer: PPO

## 2018-06-03 DIAGNOSIS — E785 Hyperlipidemia, unspecified: Secondary | ICD-10-CM

## 2018-06-03 DIAGNOSIS — I480 Paroxysmal atrial fibrillation: Secondary | ICD-10-CM | POA: Diagnosis not present

## 2018-06-03 LAB — BASIC METABOLIC PANEL
BUN/Creatinine Ratio: 19 (ref 12–28)
BUN: 18 mg/dL (ref 8–27)
CO2: 24 mmol/L (ref 20–29)
Calcium: 9.8 mg/dL (ref 8.7–10.3)
Chloride: 98 mmol/L (ref 96–106)
Creatinine, Ser: 0.97 mg/dL (ref 0.57–1.00)
GFR calc Af Amer: 65 mL/min/{1.73_m2} (ref >59)
GFR calc non Af Amer: 56 mL/min/{1.73_m2} — ABNORMAL LOW (ref >59)
Glucose: 91 mg/dL (ref 65–99)
Potassium: 3.8 mmol/L (ref 3.5–5.2)
Sodium: 138 mmol/L (ref 134–144)

## 2018-06-03 LAB — HEPATIC FUNCTION PANEL
ALT: 16 IU/L (ref 0–32)
AST: 22 IU/L (ref 0–40)
Albumin: 4.6 g/dL (ref 3.5–4.8)
Alkaline Phosphatase: 51 IU/L (ref 39–117)
Bilirubin Total: 0.5 mg/dL (ref 0.0–1.2)
Bilirubin, Direct: 0.15 mg/dL (ref 0.00–0.40)
Total Protein: 6.4 g/dL (ref 6.0–8.5)

## 2018-06-03 LAB — LIPID PANEL
Chol/HDL Ratio: 2.1 ratio (ref 0.0–4.4)
Cholesterol, Total: 160 mg/dL (ref 100–199)
HDL: 77 mg/dL (ref >39)
LDL Calculated: 70 mg/dL (ref 0–99)
Triglycerides: 67 mg/dL (ref 0–149)
VLDL Cholesterol Cal: 13 mg/dL (ref 5–40)

## 2018-06-03 MED ORDER — ATORVASTATIN CALCIUM 10 MG PO TABS: ORAL_TABLET | ORAL | 3 refills | Status: DC

## 2018-06-11 DIAGNOSIS — Z791 Long term (current) use of non-steroidal anti-inflammatories (NSAID): Secondary | ICD-10-CM | POA: Diagnosis not present

## 2018-06-11 DIAGNOSIS — I10 Essential (primary) hypertension: Secondary | ICD-10-CM | POA: Diagnosis not present

## 2018-06-11 DIAGNOSIS — M810 Age-related osteoporosis without current pathological fracture: Secondary | ICD-10-CM | POA: Diagnosis not present

## 2018-06-12 ENCOUNTER — Other Ambulatory Visit: Payer: Self-pay

## 2018-06-12 DIAGNOSIS — I480 Paroxysmal atrial fibrillation: Secondary | ICD-10-CM

## 2018-06-12 DIAGNOSIS — I5181 Takotsubo syndrome: Secondary | ICD-10-CM

## 2018-06-13 DIAGNOSIS — I480 Paroxysmal atrial fibrillation: Secondary | ICD-10-CM

## 2018-06-13 DIAGNOSIS — I5181 Takotsubo syndrome: Secondary | ICD-10-CM

## 2018-06-13 MED ORDER — CARVEDILOL 3.125 MG PO TABS: 3.1250 mg | ORAL_TABLET | Freq: Two times a day (BID) | ORAL | 3 refills | Status: DC

## 2018-06-13 MED ORDER — ENALAPRIL MALEATE 20 MG PO TABS: 20.0000 mg | ORAL_TABLET | Freq: Two times a day (BID) | ORAL | 3 refills | Status: DC

## 2018-06-13 NOTE — Telephone Encounter (Signed)
Pt's medications were sent to pt's pharmacy as requested. Confirmation received.  

## 2018-06-16 DIAGNOSIS — M159 Polyosteoarthritis, unspecified: Secondary | ICD-10-CM | POA: Diagnosis not present

## 2018-06-16 DIAGNOSIS — I10 Essential (primary) hypertension: Secondary | ICD-10-CM | POA: Diagnosis not present

## 2018-06-16 DIAGNOSIS — Z791 Long term (current) use of non-steroidal anti-inflammatories (NSAID): Secondary | ICD-10-CM | POA: Diagnosis not present

## 2018-06-16 DIAGNOSIS — M353 Polymyalgia rheumatica: Secondary | ICD-10-CM | POA: Diagnosis not present

## 2018-06-16 DIAGNOSIS — E890 Postprocedural hypothyroidism: Secondary | ICD-10-CM | POA: Diagnosis not present

## 2018-06-16 DIAGNOSIS — Z0000 Encounter for general adult medical examination without abnormal findings: Secondary | ICD-10-CM | POA: Diagnosis not present

## 2018-06-16 DIAGNOSIS — M810 Age-related osteoporosis without current pathological fracture: Secondary | ICD-10-CM | POA: Diagnosis not present

## 2018-06-16 DIAGNOSIS — I5181 Takotsubo syndrome: Secondary | ICD-10-CM | POA: Diagnosis not present

## 2018-06-16 DIAGNOSIS — I071 Rheumatic tricuspid insufficiency: Secondary | ICD-10-CM | POA: Diagnosis not present

## 2018-06-16 DIAGNOSIS — L570 Actinic keratosis: Secondary | ICD-10-CM | POA: Diagnosis not present

## 2018-06-16 DIAGNOSIS — Z6826 Body mass index (BMI) 26.0-26.9, adult: Secondary | ICD-10-CM | POA: Diagnosis not present

## 2018-06-19 DIAGNOSIS — H25811 Combined forms of age-related cataract, right eye: Secondary | ICD-10-CM | POA: Diagnosis not present

## 2018-06-19 DIAGNOSIS — H2511 Age-related nuclear cataract, right eye: Secondary | ICD-10-CM | POA: Diagnosis not present

## 2018-06-27 MED FILL — SHINGRIX 50 MCG SUS: 50 | 1 days supply | Qty: 1 | Fill #0

## 2018-07-02 DIAGNOSIS — Z85828 Personal history of other malignant neoplasm of skin: Secondary | ICD-10-CM | POA: Diagnosis not present

## 2018-07-02 DIAGNOSIS — L720 Epidermal cyst: Secondary | ICD-10-CM | POA: Diagnosis not present

## 2018-07-02 DIAGNOSIS — D2372 Other benign neoplasm of skin of left lower limb, including hip: Secondary | ICD-10-CM | POA: Diagnosis not present

## 2018-07-02 DIAGNOSIS — L821 Other seborrheic keratosis: Secondary | ICD-10-CM | POA: Diagnosis not present

## 2018-07-02 DIAGNOSIS — D2271 Melanocytic nevi of right lower limb, including hip: Secondary | ICD-10-CM | POA: Diagnosis not present

## 2018-07-02 DIAGNOSIS — L738 Other specified follicular disorders: Secondary | ICD-10-CM | POA: Diagnosis not present

## 2018-07-02 DIAGNOSIS — L570 Actinic keratosis: Secondary | ICD-10-CM | POA: Diagnosis not present

## 2018-07-02 DIAGNOSIS — D1801 Hemangioma of skin and subcutaneous tissue: Secondary | ICD-10-CM | POA: Diagnosis not present

## 2018-07-02 DIAGNOSIS — D2272 Melanocytic nevi of left lower limb, including hip: Secondary | ICD-10-CM | POA: Diagnosis not present

## 2018-07-02 DIAGNOSIS — D2262 Melanocytic nevi of left upper limb, including shoulder: Secondary | ICD-10-CM | POA: Diagnosis not present

## 2018-07-24 DIAGNOSIS — H2511 Age-related nuclear cataract, right eye: Secondary | ICD-10-CM | POA: Diagnosis not present

## 2018-07-24 DIAGNOSIS — H2512 Age-related nuclear cataract, left eye: Secondary | ICD-10-CM | POA: Diagnosis not present

## 2018-07-24 DIAGNOSIS — H25812 Combined forms of age-related cataract, left eye: Secondary | ICD-10-CM | POA: Diagnosis not present

## 2018-08-07 ENCOUNTER — Other Ambulatory Visit: Payer: Self-pay | Admitting: Gynecology

## 2018-08-07 DIAGNOSIS — Z1231 Encounter for screening mammogram for malignant neoplasm of breast: Secondary | ICD-10-CM

## 2018-08-12 DIAGNOSIS — M545 Low back pain: Secondary | ICD-10-CM | POA: Diagnosis not present

## 2018-08-12 DIAGNOSIS — M5431 Sciatica, right side: Secondary | ICD-10-CM | POA: Diagnosis not present

## 2018-08-12 DIAGNOSIS — Z6825 Body mass index (BMI) 25.0-25.9, adult: Secondary | ICD-10-CM | POA: Diagnosis not present

## 2018-08-12 DIAGNOSIS — M810 Age-related osteoporosis without current pathological fracture: Secondary | ICD-10-CM | POA: Diagnosis not present

## 2018-08-19 DIAGNOSIS — Z85828 Personal history of other malignant neoplasm of skin: Secondary | ICD-10-CM | POA: Diagnosis not present

## 2018-08-19 DIAGNOSIS — L738 Other specified follicular disorders: Secondary | ICD-10-CM | POA: Diagnosis not present

## 2018-08-19 DIAGNOSIS — Z419 Encounter for procedure for purposes other than remedying health state, unspecified: Secondary | ICD-10-CM | POA: Diagnosis not present

## 2018-08-19 DIAGNOSIS — L720 Epidermal cyst: Secondary | ICD-10-CM | POA: Diagnosis not present

## 2018-09-01 ENCOUNTER — Ambulatory Visit: Payer: PPO | Admitting: Gynecology

## 2018-09-01 ENCOUNTER — Encounter: Payer: Self-pay | Admitting: Gynecology

## 2018-09-01 VITALS — BP 122/72 | Ht 63.5 in | Wt 150.0 lb

## 2018-09-01 DIAGNOSIS — Z01419 Encounter for gynecological examination (general) (routine) without abnormal findings: Secondary | ICD-10-CM | POA: Diagnosis not present

## 2018-09-01 DIAGNOSIS — M810 Age-related osteoporosis without current pathological fracture: Secondary | ICD-10-CM

## 2018-09-01 DIAGNOSIS — N952 Postmenopausal atrophic vaginitis: Secondary | ICD-10-CM

## 2018-09-01 NOTE — Progress Notes (Signed)
    MARIN WISNER [birth year] [id number]        78 y.o.  G3P3001 for breast and pelvic exam.  No gynecologic complaints.  Past medical history,surgical history, problem list, medications, allergies, family history and social history were all reviewed and documented as reviewed in the EPIC chart.  ROS:  Performed with pertinent positives and negatives included in the history, assessment and plan.   Additional significant findings : None   Exam: [name] assistant Vitals:   09/01/18 1452  BP: 122/72  Weight: 150 lb (68 kg)  Height: 5' 3.5" (1.613 m)   Body mass index is 26.15 kg/m.  General appearance:  Normal affect, orientation and appearance. Skin: Grossly normal HEENT: Without gross lesions.  No cervical or supraclavicular adenopathy. Thyroid normal.  Lungs:  Clear without wheezing, rales or rhonchi Cardiac: RR, without RMG Abdominal:  Soft, nontender, without masses, guarding, rebound, organomegaly or hernia Breasts:  Examined lying and sitting without masses, retractions, discharge or axillary adenopathy. Pelvic:  Ext, BUS, Vagina: With atrophic changes  Cervix: With atrophic changes  Uterus: Anteverted, normal size, shape and contour, midline and mobile nontender   Adnexa: Without masses or tenderness    Anus and perineum: Normal   Rectovaginal: Normal sphincter tone without palpated masses or tenderness.    Assessment/Plan:  78 y.o. G69P3001 female for breast and pelvic exam.  1. Postmenopausal.  No significant menopausal symptoms or any vaginal bleeding. 2. Osteoporosis.  DEXA 2018.  Started on Prolia by Dr. Earlie Counts in [place] who is following her.  Due to have her follow-up DEXA in June and has this scheduled already.  We will continue to follow-up with him in reference to bone health. 3. Colonoscopy 2019. 4. Mammography coming due and patient will schedule.  Breast exam normal today. 5. Pap smear 2012.  No Pap smear done today.  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  We both agree to stop screening per current screening guidelines. 6. Health maintenance.  No routine lab work done as patient does this elsewhere.  Follow-up 1 year, sooner as needed.   Anastasio Auerbach MD, 3:22 PM 09/01/2018

## 2018-09-01 NOTE — Patient Instructions (Signed)
Follow-up in 1 year for annual exam 

## 2018-09-22 ENCOUNTER — Ambulatory Visit
Admission: RE | Admit: 2018-09-22 | Discharge: 2018-09-22 | Disposition: A | Discharge status: Home / Self Care | Payer: PPO | Source: Ambulatory Visit | Attending: Gynecology | Admitting: Gynecology

## 2018-09-22 DIAGNOSIS — Z1231 Encounter for screening mammogram for malignant neoplasm of breast: Secondary | ICD-10-CM | POA: Diagnosis not present

## 2018-09-24 DIAGNOSIS — M810 Age-related osteoporosis without current pathological fracture: Secondary | ICD-10-CM | POA: Diagnosis not present

## 2018-09-24 DIAGNOSIS — Z87312 Personal history of (healed) stress fracture: Secondary | ICD-10-CM | POA: Diagnosis not present

## 2018-09-24 DIAGNOSIS — M79672 Pain in left foot: Secondary | ICD-10-CM | POA: Diagnosis not present

## 2018-09-24 DIAGNOSIS — M19072 Primary osteoarthritis, left ankle and foot: Secondary | ICD-10-CM | POA: Diagnosis not present

## 2018-09-25 DIAGNOSIS — M810 Age-related osteoporosis without current pathological fracture: Secondary | ICD-10-CM | POA: Diagnosis not present

## 2018-11-03 ENCOUNTER — Encounter: Payer: Self-pay | Admitting: Internal Medicine

## 2018-11-10 DIAGNOSIS — Z1211 Encounter for screening for malignant neoplasm of colon: Secondary | ICD-10-CM | POA: Diagnosis not present

## 2018-11-10 DIAGNOSIS — M4120 Other idiopathic scoliosis, site unspecified: Secondary | ICD-10-CM | POA: Diagnosis not present

## 2018-11-10 DIAGNOSIS — M5126 Other intervertebral disc displacement, lumbar region: Secondary | ICD-10-CM | POA: Diagnosis not present

## 2018-11-10 DIAGNOSIS — M545 Low back pain: Secondary | ICD-10-CM | POA: Diagnosis not present

## 2018-11-10 DIAGNOSIS — M4316 Spondylolisthesis, lumbar region: Secondary | ICD-10-CM | POA: Diagnosis not present

## 2018-11-19 ENCOUNTER — Other Ambulatory Visit: Payer: Self-pay | Admitting: Internal Medicine

## 2018-11-19 DIAGNOSIS — E890 Postprocedural hypothyroidism: Secondary | ICD-10-CM

## 2018-11-20 ENCOUNTER — Other Ambulatory Visit: Payer: Self-pay

## 2018-11-20 ENCOUNTER — Other Ambulatory Visit (INDEPENDENT_AMBULATORY_CARE_PROVIDER_SITE_OTHER): Payer: PPO

## 2018-11-20 DIAGNOSIS — E1122 Type 2 diabetes mellitus with diabetic chronic kidney disease: Secondary | ICD-10-CM | POA: Diagnosis not present

## 2018-11-20 DIAGNOSIS — E890 Postprocedural hypothyroidism: Secondary | ICD-10-CM | POA: Diagnosis not present

## 2018-11-20 DIAGNOSIS — E039 Hypothyroidism, unspecified: Secondary | ICD-10-CM | POA: Diagnosis not present

## 2018-11-20 DIAGNOSIS — G4733 Obstructive sleep apnea (adult) (pediatric): Secondary | ICD-10-CM | POA: Diagnosis not present

## 2018-11-20 LAB — T4, FREE: Free T4: 1.22 ng/dL (ref 0.60–1.60)

## 2018-11-20 LAB — TSH: TSH: 0.49 u[IU]/mL (ref 0.35–4.50)

## 2018-11-24 ENCOUNTER — Other Ambulatory Visit: Payer: Self-pay | Admitting: *Deleted

## 2018-11-24 NOTE — Patient Outreach (Signed)
[facility]) Care Management  11/24/2018  JAKERA BEAUPRE 1941/07/26 [id number]   Telephone Screen  Referral Date: 11/24/18 Referral Source:  Nurse call center Referral Reason: 11/21/18 Pt accidentally took her husband's medication, Midodrine HCL 5 mg no s/s Leah R, RNInsurance: HTA    Outreach attempt # 1 to [phone] No answer. [facility] RN CM unable to leave a voicemail message    Plan: [facility] RN CM sent an unsuccessful outreach letter and scheduled this patient for another call attempt within 4 business days  Zhavia Cunanan L. Lavina Hamman, RN, BSN, [place] Coordinator Office number [phone] Mobile number [phone]  Main [facility] number [phone] Fax number [phone]

## 2018-11-25 ENCOUNTER — Other Ambulatory Visit: Payer: Self-pay | Admitting: *Deleted

## 2018-11-25 ENCOUNTER — Other Ambulatory Visit: Payer: Self-pay

## 2018-11-25 NOTE — Patient Outreach (Signed)
[facility]) Care Management  11/25/2018  [name] 1941-07-16 [id number]   Telephone Screen  Referral Date: 11/24/18 Referral Source:  Nurse call center Referral Reason: 11/21/18 Pt accidentally took her husband's medication, Midodrine HCL 5 mg no s/s Alexis Singh, RN Insurance: HTA    Outreach attempt # 2  Successful to [phone]  Patient is able to verify HIPAA Reviewed and addressed nurse call center referral to [facility] with patient   Nurse call center f/u - [name] confirmed she called the poison control center and she confirms her issues are resolved She voices appreciation for the RN CM f/u call   Social: Alexis  [name] lives at home with her husband, [name] who has memory issues. She is his primary caregiver.  She is independent with her care needs and transportation    Conditions:  HTN, mitral valve prolapse, syncope, atrial fibrillation, sinus bradycardia, pulmonary HTN, rhinitis, knee arthritis, dyslipidemia, hx of skin cancer  DME: none Medications: denies concerns with taking medications as prescribed, affording medications, side effects of medications and questions about medications   Advance Directives: Denies need for assist with advance directives    Consent: [facility] RN CM reviewed [facility] services with patient. Patient gave verbal consent for services. CM reviewed [facility] services in details. CM answered various questions for [name] about services that may benefit her and [name].   Plan: [facility] RN CM will close case at this time as patient has been assessed and no needs identified/needs resolved.   Pt encouraged to return a call to [place] CM prn  [facility] RN CM sent a successful outreach letter as discussed with [facility] brochure enclosed for review   [name] L. Lavina Hamman, RN, BSN, [place] Coordinator Office number [phone] Mobile number [phone]  Main [facility] number [phone] Fax number  [phone]

## 2018-11-28 ENCOUNTER — Other Ambulatory Visit: Payer: Self-pay | Admitting: Cardiology

## 2018-11-28 DIAGNOSIS — I5181 Takotsubo syndrome: Secondary | ICD-10-CM

## 2018-11-28 MED ORDER — AMLODIPINE BESYLATE 5 MG PO TABS: 5.0000 mg | ORAL_TABLET | Freq: Every day | ORAL | 1 refills | Status: DC

## 2018-12-15 ENCOUNTER — Telehealth: Payer: Self-pay | Admitting: Nurse Practitioner

## 2018-12-15 NOTE — Telephone Encounter (Signed)
Called patient regarding her MyChart message about low HR.  Patient states she felt dizzy yesterday morning and thought maybe her BP was hight; states HR was 48 bpm after taking carvedilol. Does not remember BP reading but states it was normal. States HR is not irregular She states this has been occurring for a few weeks intermittently but she has been busy taking care of a family member. States she has not been exercising regularly. She states dizziness does not occur frequently.  I advised that I will forward message to Dr. Meda Coffee for advice and she states she is comfortable to await her advice. She states she called earlier about an appointment for her husband and was told nothing is available with Dr. Meda Coffee. Patient agrees to await call back regarding advice. She thanked me for the call.

## 2018-12-15 NOTE — Telephone Encounter (Signed)
Called patient and reviewed Dr. Francesca Oman advice to discontinue carvedilol. I advised her to monitor HR and BP daily and to call back Friday or early next week to report. I advised her to call sooner with concerns. Patient verbalized understanding and agreement and thanked me for the call.

## 2018-12-15 NOTE — Telephone Encounter (Signed)
Please discontinue carvedilol

## 2018-12-16 ENCOUNTER — Encounter: Payer: Self-pay | Admitting: *Deleted

## 2018-12-16 NOTE — Telephone Encounter (Signed)
Virtual Visit Pre-Appointment Phone Call  "(Name), I am calling you today to discuss your upcoming appointment. We are currently trying to limit exposure to the virus that causes COVID-19 by seeing patients at home rather than in the office."  1. "What is the BEST phone number to call the day of the visit?" - include this in appointment notes - YES UPDATED IN APPT NOTES  2. Do you have or have access to (through a family member/friend) a smartphone with video capability that we can use for your visit?" a. If yes - list this number in appt notes as cell (if different from BEST phone #) and list the appointment type as a VIDEO visit in appointment notes   3. Confirm consent - "In the setting of the current Covid19 crisis, you are scheduled for a ( video) visit with Dr. Meda Coffee on 5/14 at 12 pm.  Just as we do with many in-office visits, in order for you to participate in this visit, we must obtain consent.  If you'd like, I can send this to your mychart (if signed up) or email for you to review.  Otherwise, I can obtain your verbal consent now.  All virtual visits are billed to your insurance company just like a normal visit would be.  By agreeing to a virtual visit, we'd like you to understand that the technology does not allow for your provider to perform an examination, and thus may limit your provider's ability to fully assess your condition. If your provider identifies any concerns that need to be evaluated in person, we will make arrangements to do so.  Finally, though the technology is pretty good, we cannot assure that it will always work on either your or our end, and in the setting of a video visit, we may have to convert it to a phone-only visit.  In either situation, we cannot ensure that we have a secure connection.  Are you willing to proceed?" STAFF: Did the patient verbally acknowledge consent to telehealth visit? Document YES/NO here: YES 'PT GAVE VERBAL CONSENT TO TREAT AS WELL AS  SHE IS AWARE THAT WE WILL SEND A CONSENT TO TREAT MESSAGE TO HER MYCHART ACCOUNT.  4. Advise patient to be prepared - "Two hours prior to your appointment, go ahead and check your blood pressure, pulse, oxygen saturation, and your weight (if you have the equipment to check those) and write them all down. When your visit starts, your provider will ask you for this information. If you have an Apple Watch or Kardia device, please plan to have heart rate information ready on the day of your appointment. Please have a pen and paper handy nearby the day of the visit as well."  5. Give patient instructions  to smartphone [email or web address] as below if video visit (depending on what platform provider is using)-YES  6. Inform patient they will receive a phone call 15 minutes prior to their appointment time (may be from unknown caller ID) so they should be prepared to answer-YES    TELEPHONE CALL NOTE  [name] has been deemed a candidate for a follow-up tele-health visit to limit community exposure during the Covid-19 pandemic. I spoke with the patient via phone to ensure availability of phone/video source, confirm preferred email & phone number, and discuss instructions and expectations.  I reminded [name] to be prepared with any vital sign and/or heart rhythm information that could potentially be obtained via home monitoring, at the time of  her visit. I reminded [name] to expect a phone call prior to her visit.  [name], LPN 3/32/9518 8:41 AM     IF USING  [email or web address] - The patient will receive a link just prior to their visit by text. YES THIS WAS EXPLAINED TO THE PT     FULL LENGTH CONSENT FOR TELE-HEALTH VISIT   I hereby voluntarily request, consent and authorize [facility] and its employed or contracted physicians, physician assistants, nurse practitioners or other licensed health care professionals (the Practitioner), to provide me with telemedicine health care  services (the Services") as deemed necessary by the treating Practitioner. I acknowledge and consent to receive the Services by the Practitioner via telemedicine. I understand that the telemedicine visit will involve communicating with the Practitioner through live audiovisual communication technology and the disclosure of certain medical information by electronic transmission. I acknowledge that I have been given the opportunity to request an in-person assessment or other available alternative prior to the telemedicine visit and am voluntarily participating in the telemedicine visit.  I understand that I have the right to withhold or withdraw my consent to the use of telemedicine in the course of my care at any time, without affecting my right to future care or treatment, and that the Practitioner or I may terminate the telemedicine visit at any time. I understand that I have the right to inspect all information obtained and/or recorded in the course of the telemedicine visit and may receive copies of available information for a reasonable fee.  I understand that some of the potential risks of receiving the Services via telemedicine include:   Delay or interruption in medical evaluation due to technological equipment failure or disruption;  Information transmitted may not be sufficient (e.g. poor resolution of images) to allow for appropriate medical decision making by the Practitioner; and/or   In rare instances, security protocols could fail, causing a breach of personal health information.  Furthermore, I acknowledge that it is my responsibility to provide information about my medical history, conditions and care that is complete and accurate to the best of my ability. I acknowledge that Practitioner's advice, recommendations, and/or decision may be based on factors not within their control, such as incomplete or inaccurate data provided by me or distortions of diagnostic images or specimens that may  result from electronic transmissions. I understand that the practice of medicine is not an exact science and that Practitioner makes no warranties or guarantees regarding treatment outcomes. I acknowledge that I will receive a copy of this consent concurrently upon execution via email to the email address I last provided but may also request a printed copy by calling the office of [place].    I understand that my insurance will be billed for this visit.   I have read or had this consent read to me.  I understand the contents of this consent, which adequately explains the benefits and risks of the Services being provided via telemedicine.   I have been provided ample opportunity to ask questions regarding this consent and the Services and have had my questions answered to my satisfaction.  I give my informed consent for the services to be provided through the use of telemedicine in my medical care  By participating in this telemedicine visit I agree to the above. YES PT GAVE VERBAL CONSENT TO TREAT AS WELL AS SHE IS AWARE THAT A CONSENT TO TREAT MESSAGE WILL BE SENT TO HER ACTIVE MYCHART ACCOUNT TO REVIEW.

## 2018-12-18 ENCOUNTER — Encounter: Payer: Self-pay | Admitting: Cardiology

## 2018-12-18 ENCOUNTER — Other Ambulatory Visit: Payer: Self-pay

## 2018-12-18 ENCOUNTER — Telehealth (INDEPENDENT_AMBULATORY_CARE_PROVIDER_SITE_OTHER): Payer: PPO | Admitting: Cardiology

## 2018-12-18 ENCOUNTER — Encounter: Payer: Self-pay | Admitting: *Deleted

## 2018-12-18 VITALS — BP 140/84 | HR 61 | Ht 64.0 in | Wt 150.0 lb

## 2018-12-18 DIAGNOSIS — I10 Essential (primary) hypertension: Secondary | ICD-10-CM

## 2018-12-18 DIAGNOSIS — I5181 Takotsubo syndrome: Secondary | ICD-10-CM

## 2018-12-18 DIAGNOSIS — I480 Paroxysmal atrial fibrillation: Secondary | ICD-10-CM

## 2018-12-18 DIAGNOSIS — E782 Mixed hyperlipidemia: Secondary | ICD-10-CM

## 2018-12-18 DIAGNOSIS — R001 Bradycardia, unspecified: Secondary | ICD-10-CM

## 2018-12-18 DIAGNOSIS — I272 Pulmonary hypertension, unspecified: Secondary | ICD-10-CM

## 2018-12-18 NOTE — Addendum Note (Signed)
Addended by: [name] on: 12/18/2018 12:12 PM   Modules accepted: Orders

## 2018-12-18 NOTE — Patient Instructions (Addendum)
Medication Instructions:   CONTINUE HOLDING YOUR CARVEDILOL AND TAKE YOUR BP DAILY FOR ONE WEEK, THEN EMAIL [place] YOUR BP DIARY THROUGH YOUR MYCHART IN ONE WEEK PER DR NELSON  If you need a refill on your cardiac medications before your next appointment, please call your pharmacy.    Lab work:  PRIOR TO YOUR 6 MONTH FOLLOW-UP APPOINTMENT WITH DR NELSON-TO CHECK CMET, CBC W DIFF, TSH, FREE T4, AND LIPIDS--PLEASE COME FASTING TO THIS LAB APPOINTMENT  If you have labs (blood work) drawn today and your tests are completely normal, you will receive your results only by: Marland Kitchen MyChart Message (if you have MyChart) OR . A paper copy in the mail If you have any lab test that is abnormal or we need to change your treatment, we will call you to review the results.    Follow-Up: At [facility], you and your health needs are our priority.  As part of our continuing mission to provide you with exceptional heart care, we have created designated Provider Care Teams.  These Care Teams include your primary Cardiologist (physician) and Advanced Practice Providers (APPs -  Physician Assistants and Nurse Practitioners) who all work together to provide you with the care you need, when you need it.  Your physician wants you to follow-up in: [place] will receive a reminder letter in the mail two months in advance. If you don't receive a letter, please call our office to schedule the follow-up appointment. YOU WILL NEED YOUR LABS DONE A FEW DAYS PRIOR TO THIS OFFICE VISIT   Any Other Special Instructions Will Be Listed Below (If Applicable).  PLEASE TAKE YOUR BP FOR ONE WEEK-WRITE THIS DOWN AND SEND [place] YOUR LOGGED READINGS IN ONE WEEK THROUGH YOUR MYCHART-SEND YOUR READINGS IN [place] ON NEXT Thursday 12/25/18.

## 2018-12-18 NOTE — Progress Notes (Signed)
Virtual Visit via Video Note   This visit type was conducted due to national recommendations for restrictions regarding the COVID-19 Pandemic (e.g. social distancing) in an effort to limit this patient's exposure and mitigate transmission in our community.  Due to her co-morbid illnesses, this patient is at least at moderate risk for complications without adequate follow up.  This format is felt to be most appropriate for this patient at this time.  All issues noted in this document were discussed and addressed.  A limited physical exam was performed with this format.  Please refer to the patient's chart for her consent to telehealth for [facility].   Date:  12/18/2018   ID:  Alexis, [name] 01/21/1941, MRN [id number]  Patient Location: Home Provider Location: Home  PCP:  Deland Pretty, MD  Cardiologist: Ena Dawley  Electrophysiologist:  None   Evaluation Performed:  Follow-Up Visit  Chief Complaint: Dizziness, bradycardia, hypertension  History of Present Illness:    [name] is a 78 y.o. 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 She was treated with radioactive iodine and stable since then. Her LVEF has improved. Echocardiogram in 2014 normal LVEF and severe pulmonary HTN with RVSP 63 mmHg. Repeat echo in 2016 showed improvement in PA pressure to normal 25 mmHg. She also has MVP and mild MR. She is the primary caregiver for her husband, who is also a pt of Dr. Francesca Oman. He has Parkinson's disease. It is a lot of stress for her.   The patient called about a week ago with concerns of dizziness and bradycardia down to 40s, her carvedilol was discontinued her heart rate today 61 bpm and she overall feels better.  She is concerned about elevated blood pressure, she has a diary with blood pressure in the morning in 140s, this is 1 hour before she takes her  medications, and at night readings such as 118 120 mmHg.  She denies any chest pain shortness of breath palpitation lower extremity edema.  The patient does not have symptoms concerning for COVID-19 infection (fever, chills, cough, or new shortness of breath).    Past Medical History:  Diagnosis Date  . Abnormal EKG   . Atrial fibrillation ([place])   . Basal cell carcinoma of nose    removed w/MOHs  . CAD (coronary artery disease)    a. NSTEMI 06/2013 => LHC:  mLAD 40, oD2 70 (small), pOM 20, mRCA 20, mid to dist ant HK, EF 30-35% (c/w Tako-Tsubo CM)  . Cataract   . Chronic cystitis   . CIN I (cervical intraepithelial neoplasia I)   . Dyslipidemia   . Ejection fraction    EF 65%, echo, 2011  . Heart murmur    MVP   . Hemorrhoids   . History of colon polyps   . HTN (hypertension)   . Hx of colonoscopy   . Hyperlipidemia   . Hyponatremia   . 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 IBS (irritable bowel syndrome)   . Lumbar vertebral fracture (HCC)   . Mitral valve prolapse    echo, January, 2011, moderate prolapse with your leaflet, trivial MR   Antibiotic required for procedures  . MVP (mitral valve prolapse)   . Orthostasis    Mild orthostatic change when she stands  . Osteoarthritis    "do not have RA" (06/17/2013)  .  Osteoporosis 01/2014   T score -2.3 followed by Dr. Marcelino Scot  . Polymyalgia rheumatica ([place])   . PONV (postoperative nausea and vomiting)   . Potassium (K) deficiency    5 potassium requirement over time  . 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 Syncope    August, 200 weight, dehydration  . 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 EF 30-35% at [facility] 06/2013;  b.  f/u Echo (06/18/13):  EF 60-65%, normal wall motion, Gr 1 DD, mild MVP of post leaflet, mod TR, PASP 63  . Thyroid disease   . Tricuspid regurgitation    mild to moderate, echo, January, 2011, PA pressure 38 mm mercury   Past  Surgical History:  Procedure Laterality Date  . CARDIAC CATHETERIZATION  06/17/2013  . COLONOSCOPY  12/23/2003   normal (indications: prior adenomas and grandfather with colon cancer)  . COLPOSCOPY    . EXCISIONAL HEMORRHOIDECTOMY  2003  . LEFT HEART CATHETERIZATION WITH CORONARY ANGIOGRAM N/A 06/17/2013   Procedure: LEFT HEART CATHETERIZATION WITH CORONARY ANGIOGRAM;  Surgeon: Peter M Martinique, MD;  Location[facility] C[facility] LAB;  Service: Cardiovascular;  Laterality: N/A;  . MOHS SURGERY Left 2011   "side of my nose" (06/17/2013)  . TONSILLECTOMY    . TOTAL KNEE ARTHROPLASTY  08/2010  . TOTAL KNEE ARTHROPLASTY  02/25/2012   Procedure: TOTAL KNEE ARTHROPLASTY;  Surgeon: Gearlean Alf, MD;  Location: [facility] ORS;  Service: Orthopedics;  Laterality: Right;     Current Meds  Medication Sig  . amLODipine (NORVASC) 5 MG tablet Take 1 tablet (5 mg total) by mouth daily with supper.  Marland Kitchen atorvastatin (LIPITOR) 10 MG tablet Take 1 tablet by mouth every day with breakfast.  . Biotin 5000 MCG CAPS Take 1 capsule by mouth daily.   . Calcium Carbonate (CALTRATE 600 PO) Take 1 tablet by mouth 2 (two) times a day.   . Cholecalciferol (D3-1000) 1000 UNITS capsule Take 1,000 Units by mouth daily.  Marland Kitchen denosumab (PROLIA) 60 MG/ML SOSY injection Inject 60 mg into the skin every 6 (six) months.  . diclofenac sodium (VOLTAREN) 1 % GEL 1 application.  . enalapril (VASOTEC) 20 MG tablet Take 1 tablet (20 mg total) by mouth 2 (two) times daily.  Marland Kitchen EVENING PRIMROSE OIL PO Take 1 capsule by mouth 2 (two) times a day.   . gabapentin (NEURONTIN) 100 MG capsule Take 500 capsules by mouth daily.   . hydrochlorothiazide (HYDRODIURIL) 25 MG tablet Take 1 tablet (25 mg total) by mouth daily.  Marland Kitchen levothyroxine (SYNTHROID, LEVOTHROID) 75 MCG tablet Take 1 tablet (75 mcg total) by mouth daily.  . meloxicam (MOBIC) 15 MG tablet Take 15 mg by mouth daily.  . methylcellulose packet Take 1 each by mouth daily. CITRICEL  . metroNIDAZOLE  (METROCREAM) 0.75 % cream Apply 1 application topically 2 (two) times daily.  . pantoprazole (PROTONIX) 40 MG tablet Take 1 tablet by mouth daily.  . traMADol (ULTRAM) 50 MG tablet Take 50 mg by mouth as needed.  . tretinoin (RETIN-A) 0.05 % cream Apply 1 application topically at bedtime.  Marland Kitchen zolpidem (AMBIEN) 5 MG tablet Take 5 mg by mouth at bedtime as needed for sleep.     Allergies:   Patient has no known allergies.   Social History   Tobacco Use  . Smoking status: Never Smoker  . Smokeless tobacco: Never Used  Substance Use Topics  . Alcohol use: No    Alcohol/week: 0.0 standard  drinks  . Drug use: No     Family Hx: The patient's family history includes Breast cancer in her paternal aunt; Breast cancer (age of onset: 49) in her sister; Colon cancer in her maternal grandfather; Coronary artery disease in her father; Esophageal cancer in her paternal grandmother; Heart attack (age of onset: 68) in her mother; Heart attack (age of onset: 51) in her father; Heart disease in her father, maternal grandfather, mother, paternal grandfather, and sister; Hypertension in her father, mother, and sister; Irritable bowel syndrome in her sister; Stroke in her mother. There is no history of Rectal cancer or Stomach cancer.  ROS:   Please see the history of present illness.    All other systems reviewed and are negative.   Prior CV studies:   The following studies were reviewed today:   Labs/Other Tests and Data Reviewed:    EKG:  No ECG reviewed.  Recent Labs: 06/03/2018: ALT 16; BUN 18; Creatinine, Ser 0.97; Potassium 3.8; Sodium 138 11/20/2018: TSH 0.49   Recent Lipid Panel Lab Results  Component Value Date/Time   CHOL 160 06/03/2018 10:00 AM   TRIG 67 06/03/2018 10:00 AM   TRIG 69 05/27/2006 09:28 AM   HDL 77 06/03/2018 10:00 AM   CHOLHDL 2.1 06/03/2018 10:00 AM   CHOLHDL 2 01/13/2015 09:18 AM   LDLCALC 70 06/03/2018 10:00 AM    Wt Readings from Last 3 Encounters:  12/18/18  150 lb (68 kg)  09/01/18 150 lb (68 kg)  06/02/18 152 lb (68.9 kg)     Objective:    Vital Signs:  BP 140/84   Pulse 61   Ht 5\' 4"  (1.626 m)   Wt 150 lb (68 kg)   BMI 25.75 kg/m    VITAL SIGNS:  reviewed  ASSESSMENT & PLAN:    1.  Paroxysmal atrial fibrillation -only one episode triggered by Graves' disease.  Now in sinus bradycardia.  Carvedilol was discontinued as she had symptomatic bradycardia down to 40s, she will let us know if her symptoms are persistent despite discontinuation of carvedilol.  2. H/o graves disease: TSH normalized after she was treated with radioactive iodine. This has been followed by an endocrinologist, most recent TSH and free T4 in April 2020 within normal limits.     3. Takotsubo CMP:  Patient was hospitalized in November, 2014. Catheterization revealed no significant coronary disease. Wall motion abnormalities suggested Takot-tsubo. Her LV function normalized very rapidly within a few days.  She has no recurrency and is functional class I.  4. Pulmonary HTN: noted on echo in 2014 however improved on echo in 2016, PA pressure was 25 mmHg.   5. Hyperlipidemia: on statin therapy w/ atorvastatin. Lipid panel last year showed controlled LDL at 65 mg/dL.  will repeat FLP.   6. HTN: controlled on current regimen.  She is advised to document the diary in the next week but 1 hour after she takes her medication and email them to [place] we will adjust blood pressure medication as needed.    7. MVP/MR: mild MR noted on echo in 2016. She denies CP and no significant dyspnea. She has chronic stable dyspnea if she walks up very steep hills, but no change in her baseline.     COVID-19 Education: The signs and symptoms of COVID-19 were discussed with the patient and how to seek care for testing (follow up with PCP or arrange E-visit).  The importance of social distancing was discussed today.  Time:   Today, I  have spent 25 minutes with the patient with telehealth  technology discussing the above problems.     Medication Adjustments/Labs and Tests Ordered: Current medicines are reviewed at length with the patient today.  Concerns regarding medicines are outlined above.   Tests Ordered: No orders of the defined types were placed in this encounter.   Medication Changes: No orders of the defined types were placed in this encounter.   Disposition:  Follow up in 6 month(s)  Signed, Ena Dawley, MD  12/18/2018 11:12 AM    [place]

## 2018-12-26 NOTE — Telephone Encounter (Signed)
I would continue holding Coreg

## 2018-12-26 NOTE — Telephone Encounter (Signed)
Informed the pt via her mychart message, that per Dr Meda Coffee, she recommends that she continue to hold her Coreg.

## 2019-01-13 DIAGNOSIS — M810 Age-related osteoporosis without current pathological fracture: Secondary | ICD-10-CM | POA: Diagnosis not present

## 2019-01-23 DIAGNOSIS — M150 Primary generalized (osteo)arthritis: Secondary | ICD-10-CM | POA: Diagnosis not present

## 2019-01-23 DIAGNOSIS — M810 Age-related osteoporosis without current pathological fracture: Secondary | ICD-10-CM | POA: Diagnosis not present

## 2019-01-28 ENCOUNTER — Telehealth: Payer: Self-pay | Admitting: *Deleted

## 2019-01-28 DIAGNOSIS — M810 Age-related osteoporosis without current pathological fracture: Secondary | ICD-10-CM

## 2019-01-28 NOTE — Telephone Encounter (Signed)
Left detailed message on home # per DPR access

## 2019-01-28 NOTE — Telephone Encounter (Signed)
Patient called stating that she had dexa done beginning of this month with Dr. Earlie Counts office in [place] for Prolia follow up. (patient said his office notes should be coming to you via fax) patient said she spoke with you at annual exa and you told her further dexa can be done here, patient is not able to drive up and down to [place]. Dr.Lassiter would like for her to have another dexa now at this office as a baseline. She knows insurance won't pay for this. Please advise

## 2019-01-28 NOTE — Telephone Encounter (Signed)
I am okay if she wants to schedule a bone density here.

## 2019-02-25 ENCOUNTER — Other Ambulatory Visit: Payer: Self-pay | Admitting: Cardiology

## 2019-02-25 DIAGNOSIS — I5181 Takotsubo syndrome: Secondary | ICD-10-CM

## 2019-02-25 DIAGNOSIS — I480 Paroxysmal atrial fibrillation: Secondary | ICD-10-CM

## 2019-02-25 MED ORDER — HYDROCHLOROTHIAZIDE 25 MG PO TABS: 25.0000 mg | ORAL_TABLET | Freq: Every day | ORAL | 3 refills | Status: DC

## 2019-03-03 ENCOUNTER — Encounter: Payer: Self-pay | Admitting: *Deleted

## 2019-03-09 DIAGNOSIS — T1511XA Foreign body in conjunctival sac, right eye, initial encounter: Secondary | ICD-10-CM | POA: Diagnosis not present

## 2019-03-17 ENCOUNTER — Other Ambulatory Visit: Payer: Self-pay | Admitting: Cardiology

## 2019-03-17 MED ORDER — ATORVASTATIN CALCIUM 10 MG PO TABS: ORAL_TABLET | ORAL | 3 refills | Status: DC

## 2019-03-18 ENCOUNTER — Other Ambulatory Visit: Payer: Self-pay

## 2019-03-18 ENCOUNTER — Other Ambulatory Visit: Payer: Self-pay | Admitting: Cardiology

## 2019-03-18 MED ORDER — ENALAPRIL MALEATE 20 MG PO TABS: 20.0000 mg | ORAL_TABLET | Freq: Two times a day (BID) | ORAL | 2 refills | Status: DC

## 2019-03-19 ENCOUNTER — Ambulatory Visit (INDEPENDENT_AMBULATORY_CARE_PROVIDER_SITE_OTHER): Payer: PPO

## 2019-03-19 DIAGNOSIS — M810 Age-related osteoporosis without current pathological fracture: Secondary | ICD-10-CM | POA: Diagnosis not present

## 2019-03-19 DIAGNOSIS — Z780 Asymptomatic menopausal state: Secondary | ICD-10-CM

## 2019-03-20 ENCOUNTER — Other Ambulatory Visit: Payer: Self-pay | Admitting: Gynecology

## 2019-03-20 DIAGNOSIS — M810 Age-related osteoporosis without current pathological fracture: Secondary | ICD-10-CM

## 2019-03-20 DIAGNOSIS — Z780 Asymptomatic menopausal state: Secondary | ICD-10-CM

## 2019-03-23 ENCOUNTER — Encounter: Payer: Self-pay | Admitting: Gynecology

## 2019-03-24 DIAGNOSIS — Z79899 Other long term (current) drug therapy: Secondary | ICD-10-CM | POA: Diagnosis not present

## 2019-04-02 DIAGNOSIS — M810 Age-related osteoporosis without current pathological fracture: Secondary | ICD-10-CM | POA: Diagnosis not present

## 2019-05-11 DIAGNOSIS — M4316 Spondylolisthesis, lumbar region: Secondary | ICD-10-CM | POA: Diagnosis not present

## 2019-05-11 DIAGNOSIS — S32049A Unspecified fracture of fourth lumbar vertebra, initial encounter for closed fracture: Secondary | ICD-10-CM | POA: Diagnosis not present

## 2019-05-11 DIAGNOSIS — M545 Low back pain: Secondary | ICD-10-CM | POA: Diagnosis not present

## 2019-05-11 DIAGNOSIS — M5126 Other intervertebral disc displacement, lumbar region: Secondary | ICD-10-CM | POA: Diagnosis not present

## 2019-05-11 DIAGNOSIS — M4120 Other idiopathic scoliosis, site unspecified: Secondary | ICD-10-CM | POA: Diagnosis not present

## 2019-05-14 ENCOUNTER — Encounter: Payer: Self-pay | Admitting: Gynecology

## 2019-05-15 ENCOUNTER — Other Ambulatory Visit: Payer: Self-pay

## 2019-05-15 DIAGNOSIS — M7741 Metatarsalgia, right foot: Secondary | ICD-10-CM | POA: Diagnosis not present

## 2019-05-15 DIAGNOSIS — M79672 Pain in left foot: Secondary | ICD-10-CM | POA: Diagnosis not present

## 2019-05-15 DIAGNOSIS — M79671 Pain in right foot: Secondary | ICD-10-CM | POA: Diagnosis not present

## 2019-05-15 DIAGNOSIS — M2021 Hallux rigidus, right foot: Secondary | ICD-10-CM | POA: Diagnosis not present

## 2019-05-18 ENCOUNTER — Other Ambulatory Visit: Payer: Self-pay

## 2019-05-18 ENCOUNTER — Ambulatory Visit (INDEPENDENT_AMBULATORY_CARE_PROVIDER_SITE_OTHER): Payer: PPO | Admitting: Internal Medicine

## 2019-05-18 ENCOUNTER — Encounter: Payer: Self-pay | Admitting: Internal Medicine

## 2019-05-18 VITALS — BP 138/70 | HR 75 | Ht 63.58 in | Wt 149.0 lb

## 2019-05-18 DIAGNOSIS — Z8639 Personal history of other endocrine, nutritional and metabolic disease: Secondary | ICD-10-CM

## 2019-05-18 DIAGNOSIS — E507 Other ocular manifestations of vitamin A deficiency: Secondary | ICD-10-CM

## 2019-05-18 DIAGNOSIS — Z23 Encounter for immunization: Secondary | ICD-10-CM

## 2019-05-18 DIAGNOSIS — E890 Postprocedural hypothyroidism: Secondary | ICD-10-CM

## 2019-05-18 NOTE — Patient Instructions (Signed)
Please continue Levothyroxine 75 mcg daily.  Take the thyroid hormone every day, with water, at least 30 minutes before breakfast, separated by at least 4 hours from: - acid reflux medications - calcium - iron - multivitamins  Please come back for a follow-up appointment in 1 year. Please  do not take the Biotin for 5 days before lab draw.

## 2019-05-18 NOTE — Progress Notes (Signed)
Patient ID: [name], female   DOB: 07-28-1941, 78 y.o.   MRN: [id number]   HPI  [name] is a 78 y.o.-year-old female, returning for f/u for h/o Graves ds and now postablative hypothyroidism. Last visit 1 year ago.  Reviewed and addended history: Patient was admitted to the hospital 06/2013 with Takotsubo cardiomyopathy, and a low TSH was found incidentally. She had cardiac catheterization on 06/17/2013 (after blood for TSH was drawn). She was also found to have atrial fibrillation in the hospital, now resolved (was on Coreg in the past and continues now). The patient's EF also normalized before discharge.  She was scheduled to have a hospital followup appointment with her PCP. Dr. Regis Bill repeated a TSH level and added a free T4 and a free T3 and these returned abnormal, suggesting thyrotoxicosis. Of note, her pulse was in the 50s and she was not in atrial fibrillation at the time of the appointment. Patient was referred to endocrinology for for further management.  Thyroid Uptake and scan (12/28/2013): Markedly elevated 24 hr radio iodine uptake of 82% Normal thyroid scan. Findings consistent with Graves disease.  Pt was initially on MMI, then had RAI Tx 01/26/2014 >> developed post ablative hypothyroidism  Pt is on levothyroxine 75 mcg daily, taken: - in am - fasting - at least 30 min from b'fast - no Fe, MVI, PPIs - + Calcium with lunch and dinner - off Biotin for 5-6 days  Repeat TFTs: Lab Results  Component Value Date   TSH 0.49 11/20/2018   TSH 0.53 05/22/2018   TSH 1.08 11/18/2017   TSH 3.110 04/09/2017   TSH 1.38 02/15/2016   TSH 1.24 08/22/2015   TSH 0.63 05/23/2015   TSH 0.84 11/22/2014   TSH 3.79 08/09/2014   TSH 17.05 (H) 07/05/2014   FREET4 1.22 11/20/2018   FREET4 1.20 05/22/2018   FREET4 1.02 11/18/2017   FREET4 1.02 08/22/2015   FREET4 1.66 (H) 05/23/2015   FREET4 1.42 11/22/2014   FREET4 1.36 08/09/2014   FREET4 0.96 07/05/2014   FREET4 0.85 05/13/2014   FREET4 1.23 04/15/2014  fT3 on 06/26/2013: 6.4 (2.3 - 4.2)   No results found for: TSI   Pt denies: - feeling nodules in neck - hoarseness - dysphagia - choking - SOB with lying down  She also has a h/o HTN, HL, tricuspid regurgitation, mitral valve prolapse, PMR.  She had cataract sx. 06/2018. Now developed dry eyes. She sees Dr. Ellie Lunch.  She is off carvedilol b/c fatigue and bradycardia.  She has a h/o L4 burst compression fx >> Gabapentin.  She continues on Prolia.  She tolerates this well.  This is managed in [place].  ROS: Constitutional: no weight gain/no weight loss, no fatigue, no subjective hyperthermia, no subjective hypothermia Eyes: no blurry vision, + xerophthalmia ENT: no sore throat, + see HPI Cardiovascular: no CP/no SOB/no palpitations/no leg swelling Respiratory: no cough/no SOB/no wheezing Gastrointestinal: no N/no V/no D/no C/no acid reflux Musculoskeletal: no muscle aches/no joint aches Skin: no rashes, no hair loss Neurological: no tremors/no numbness/no tingling/no dizziness  I reviewed pt's medications, allergies, PMH, social hx, family hx, and changes were documented in the history of present illness. Otherwise, unchanged from my initial visit note.  Past Medical History:  Diagnosis Date  . Abnormal EKG   . Atrial fibrillation ([place])   . Basal cell carcinoma of nose    removed w/MOHs  . CAD (coronary artery disease)    a. NSTEMI 06/2013 => LHC:  mLAD  40, oD2 70 (small), pOM 20, mRCA 20, mid to dist ant HK, EF 30-35% (c/w Tako-Tsubo CM)  . Cataract   . Chronic cystitis   . CIN I (cervical intraepithelial neoplasia I)   . Dyslipidemia   . Ejection fraction    EF 65%, echo, 2011  . Heart murmur    MVP   . Hemorrhoids   . History of colon polyps   . HTN (hypertension)   . Hx of colonoscopy   . Hyperlipidemia   . Hyponatremia   . 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 IBS (irritable  bowel syndrome)   . Lumbar vertebral fracture (HCC)   . Mitral valve prolapse    echo, January, 2011, moderate prolapse with your leaflet, trivial MR   Antibiotic required for procedures  . MVP (mitral valve prolapse)   . Orthostasis    Mild orthostatic change when she stands  . Osteoarthritis    "do not have RA" (06/17/2013)  . Osteoporosis 01/2014, 03/2019   01/2014 T score -2.3 followed by Dr. Marcelino Scot, 03/2019 T score -2.3  . Polymyalgia rheumatica ([place])   . PONV (postoperative nausea and vomiting)   . Potassium (K) deficiency    5 potassium requirement over time  . 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 Syncope    August, 200 weight, dehydration  . 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 EF 30-35% at [facility] 06/2013;  b.  f/u Echo (06/18/13):  EF 60-65%, normal wall motion, Gr 1 DD, mild MVP of post leaflet, mod TR, PASP 63  . Thyroid disease   . Tricuspid regurgitation    mild to moderate, echo, January, 2011, PA pressure 38 mm mercury   Past Surgical History:  Procedure Laterality Date  . CARDIAC CATHETERIZATION  06/17/2013  . COLONOSCOPY  12/23/2003   normal (indications: prior adenomas and grandfather with colon cancer)  . COLPOSCOPY    . EXCISIONAL HEMORRHOIDECTOMY  2003  . LEFT HEART CATHETERIZATION WITH CORONARY ANGIOGRAM N/A 06/17/2013   Procedure: LEFT HEART CATHETERIZATION WITH CORONARY ANGIOGRAM;  Surgeon: Peter M Martinique, MD;  Location: [facility] CATH LAB;  Service: Cardiovascular;  Laterality: N/A;  . MOHS SURGERY Left 2011   "side of my nose" (06/17/2013)  . TONSILLECTOMY    . TOTAL KNEE ARTHROPLASTY  08/2010  . TOTAL KNEE ARTHROPLASTY  02/25/2012   Procedure: TOTAL KNEE ARTHROPLASTY;  Surgeon: Gearlean Alf, MD;  Location: [facility] ORS;  Service: Orthopedics;  Laterality: Right;   Social History   Socioeconomic History  . Marital status: Married    Spouse name: Not on file  . Number of children: 1  . Years of education: Not on  file  . Highest education level: Not on file  Occupational History  . Occupation: Agricultural engineer  Social Needs  . Financial resource strain: Not on file  . Food insecurity    Worry: Not on file    Inability: Not on file  . Transportation needs    Medical: Not on file    Non-medical: Not on file  Tobacco Use  . Smoking status: Never Smoker  . Smokeless tobacco: Never Used  Substance and Sexual Activity  . Alcohol use: No    Alcohol/week: 0.0 standard drinks  . Drug use: No  . Sexual activity: Never    Birth control/protection: Post-menopausal    Comment: 1st intercourse 78 yo-Fewer than 5 partners  Lifestyle  . Physical activity  Days per week: Not on file    Minutes per session: Not on file  . Stress: Not on file  Relationships  . Social Herbalist on phone: Not on file    Gets together: Not on file    Attends religious service: Not on file    Active member of club or organization: Not on file    Attends meetings of clubs or organizations: Not on file    Relationship status: Not on file  . Intimate partner violence    Fear of current or ex partner: Not on file    Emotionally abused: Not on file    Physically abused: Not on file    Forced sexual activity: Not on file  Other Topics Concern  . Not on file  Social History Narrative   3 caffeine drinks daily    Married   Retired         Current Outpatient Medications on File Prior to Visit  Medication Sig Dispense Refill  . amLODipine (NORVASC) 5 MG tablet Take 1 tablet (5 mg total) by mouth daily with supper. 90 tablet 1  . atorvastatin (LIPITOR) 10 MG tablet Take 1 tablet by mouth every day with breakfast. 90 tablet 3  . Biotin 5000 MCG CAPS Take 1 capsule by mouth daily.     . Calcium Carbonate (CALTRATE 600 PO) Take 1 tablet by mouth 2 (two) times a day.     . Cholecalciferol (D3-1000) 1000 UNITS capsule Take 1,000 Units by mouth daily.    Marland Kitchen denosumab (PROLIA) 60 MG/ML SOSY injection Inject 60 mg into the  skin every 6 (six) months.    . diclofenac sodium (VOLTAREN) 1 % GEL 1 application.  2  . enalapril (VASOTEC) 20 MG tablet Take 1 tablet (20 mg total) by mouth 2 (two) times daily. 180 tablet 2  . EVENING PRIMROSE OIL PO Take 1 capsule by mouth 2 (two) times a day.     . gabapentin (NEURONTIN) 100 MG capsule Take 500 capsules by mouth daily.   3  . hydrochlorothiazide (HYDRODIURIL) 25 MG tablet Take 1 tablet (25 mg total) by mouth daily. 90 tablet 3  . levothyroxine (SYNTHROID, LEVOTHROID) 75 MCG tablet Take 1 tablet (75 mcg total) by mouth daily. 90 tablet 3  . meloxicam (MOBIC) 15 MG tablet Take 15 mg by mouth daily.    . methylcellulose packet Take 1 each by mouth daily. CITRICEL    . metroNIDAZOLE (METROCREAM) 0.75 % cream Apply 1 application topically 2 (two) times daily.    . pantoprazole (PROTONIX) 40 MG tablet Take 1 tablet by mouth daily.    . traMADol (ULTRAM) 50 MG tablet Take 50 mg by mouth as needed.    . tretinoin (RETIN-A) 0.05 % cream Apply 1 application topically at bedtime.    Marland Kitchen zolpidem (AMBIEN) 5 MG tablet Take 5 mg by mouth at bedtime as needed for sleep.     No current facility-administered medications on file prior to visit.    No Known Allergies Family History  Problem Relation Age of Onset  . Heart attack Mother 75  . Hypertension Mother   . Heart disease Mother   . Stroke Mother   . Heart attack Father 47  . Coronary artery disease Father        Multiple Family Members  . Hypertension Father   . Heart disease Father   . Esophageal cancer Paternal Grandmother   . Colon cancer Maternal Grandfather   .  Heart disease Maternal Grandfather   . Irritable bowel syndrome Sister        More family members on father side of   . Hypertension Sister   . Heart disease Sister   . Breast cancer Sister 44  . Breast cancer Paternal Aunt        Age 53's  . Heart disease Paternal Grandfather   . Rectal cancer Neg Hx   . Stomach cancer Neg Hx     PE: BP 138/70    Pulse 75   Ht 5' 3.58" (1.615 m) Comment: measured today without shoes  Wt 149 lb (67.6 kg)   SpO2 99%   BMI 25.91 kg/m  Body mass index is 25.91 kg/m. Wt Readings from Last 3 Encounters:  05/18/19 149 lb (67.6 kg)  12/18/18 150 lb (68 kg)  09/01/18 150 lb (68 kg)   Constitutional: normal weight, in NAD Eyes: PERRLA, EOMI, no exophthalmos ENT: moist mucous membranes, no thyromegaly, no cervical lymphadenopathy Cardiovascular: RRR, No MRG Respiratory: CTA B Gastrointestinal: abdomen soft, NT, ND, BS+ Musculoskeletal: no deformities, strength intact in all 4 Skin: moist, warm, no rashes Neurological: no tremor with outstretched hands, DTR not elicited (s/p B TKR)  ASSESSMENT: 1. H/o Graves ds  2. Postablative hypothyroidism  3. Dry eyes (xerophthalmia)  4. S/p L4 fracture - She fell backwards >> L4 compression fracture.  - She was on Reclast >> this year was her fourth drug holiday year. - Managed by Dr. Marcelino Scot in [place] - Discussed pros and cons of Prolia >> started it and continues on this  PLAN:  1. Patient with history of Graves' disease, status post RAI treatment, now with post ablative hypothyroidism -off beta-blocker (Coreg) per cardiology -No signs of Graves' ophthalmopathy: No exophthalmos, chemosis, double vision, eye pain; but does have dry eyes, which are relatively new  2. Postablative hypothyroidism - latest thyroid labs reviewed with pt >> normal 11/2018 - she continues on LT4 75 mcg daily - pt feels good on this dose. - we discussed about taking the thyroid hormone every day, with water, >30 minutes before breakfast, separated by >4 hours from acid reflux medications, calcium, iron, multivitamins. Pt. is taking it correctly. - will check thyroid tests today: TSH and fT4 - If labs are abnormal, she will need to return for repeat TFTs in 1.5 months  4. Dry eyes -Relatively new, after her cataract surgery from last year -She reports drops, which  relieves the symptoms -At this visit, we will check TSI's to screen for Graves' ophthalmopathy  Given flu shot today.  Needs refills.  Component     Latest Ref Rng & Units 05/18/2019  TSH     0.35 - 4.50 uIU/mL 0.27 (L)  T4,Free(Direct)     0.60 - 1.60 ng/dL 1.39  TSI     <140 % baseline 472 (H)  TSIs are elevated and the TSH is slightly suppressed.  We will decrease the dose of levothyroxine to 50 mcg daily and have her back for labs in 5 to 6 weeks.  Philemon Kingdom, MD PhD [facility]

## 2019-05-19 LAB — T4, FREE: Free T4: 1.39 ng/dL (ref 0.60–1.60)

## 2019-05-19 LAB — TSH: TSH: 0.27 u[IU]/mL — ABNORMAL LOW (ref 0.35–4.50)

## 2019-05-21 LAB — THYROID STIMULATING IMMUNOGLOBULIN: TSI: 472 % baseline — ABNORMAL HIGH (ref <140)

## 2019-05-21 MED ORDER — LEVOTHYROXINE SODIUM 50 MCG PO TABS: 50.0000 ug | ORAL_TABLET | Freq: Every day | ORAL | 3 refills | Status: DC

## 2019-05-22 ENCOUNTER — Encounter: Payer: Self-pay | Admitting: Internal Medicine

## 2019-06-18 DIAGNOSIS — I10 Essential (primary) hypertension: Secondary | ICD-10-CM | POA: Diagnosis not present

## 2019-06-18 DIAGNOSIS — E039 Hypothyroidism, unspecified: Secondary | ICD-10-CM | POA: Diagnosis not present

## 2019-06-18 DIAGNOSIS — M810 Age-related osteoporosis without current pathological fracture: Secondary | ICD-10-CM | POA: Diagnosis not present

## 2019-06-29 ENCOUNTER — Other Ambulatory Visit: Payer: Self-pay | Admitting: Internal Medicine

## 2019-06-29 ENCOUNTER — Other Ambulatory Visit (INDEPENDENT_AMBULATORY_CARE_PROVIDER_SITE_OTHER): Payer: PPO

## 2019-06-29 ENCOUNTER — Other Ambulatory Visit: Payer: Self-pay

## 2019-06-29 DIAGNOSIS — E890 Postprocedural hypothyroidism: Secondary | ICD-10-CM

## 2019-06-29 LAB — TSH: TSH: 3.88 u[IU]/mL (ref 0.35–4.50)

## 2019-06-29 LAB — T4, FREE: Free T4: 0.86 ng/dL (ref 0.60–1.60)

## 2019-07-07 ENCOUNTER — Other Ambulatory Visit: Payer: PPO

## 2019-07-07 DIAGNOSIS — M810 Age-related osteoporosis without current pathological fracture: Secondary | ICD-10-CM | POA: Diagnosis not present

## 2019-07-07 DIAGNOSIS — I2510 Atherosclerotic heart disease of native coronary artery without angina pectoris: Secondary | ICD-10-CM | POA: Diagnosis not present

## 2019-07-07 DIAGNOSIS — E039 Hypothyroidism, unspecified: Secondary | ICD-10-CM | POA: Diagnosis not present

## 2019-07-07 DIAGNOSIS — Z8679 Personal history of other diseases of the circulatory system: Secondary | ICD-10-CM | POA: Diagnosis not present

## 2019-07-07 DIAGNOSIS — Z0000 Encounter for general adult medical examination without abnormal findings: Secondary | ICD-10-CM | POA: Diagnosis not present

## 2019-07-07 DIAGNOSIS — I10 Essential (primary) hypertension: Secondary | ICD-10-CM | POA: Diagnosis not present

## 2019-07-08 ENCOUNTER — Ambulatory Visit (INDEPENDENT_AMBULATORY_CARE_PROVIDER_SITE_OTHER): Payer: PPO | Admitting: Cardiology

## 2019-07-08 ENCOUNTER — Other Ambulatory Visit: Payer: Self-pay

## 2019-07-08 ENCOUNTER — Encounter: Payer: Self-pay | Admitting: Cardiology

## 2019-07-08 VITALS — BP 130/84 | HR 57 | Ht 64.0 in | Wt 147.2 lb

## 2019-07-08 DIAGNOSIS — I10 Essential (primary) hypertension: Secondary | ICD-10-CM

## 2019-07-08 DIAGNOSIS — I272 Pulmonary hypertension, unspecified: Secondary | ICD-10-CM | POA: Diagnosis not present

## 2019-07-08 DIAGNOSIS — I341 Nonrheumatic mitral (valve) prolapse: Secondary | ICD-10-CM

## 2019-07-08 DIAGNOSIS — I5181 Takotsubo syndrome: Secondary | ICD-10-CM

## 2019-07-08 DIAGNOSIS — E782 Mixed hyperlipidemia: Secondary | ICD-10-CM

## 2019-07-08 NOTE — Progress Notes (Signed)
Cardiology Office Note:    Date:  07/08/2019   ID:  [name], [name] 04-May-1941, MRN [id number]  PCP:  Deland Pretty, MD  Cardiologist:  No primary care provider on file.  Electrophysiologist:  None   Referring MD: Deland Pretty, MD   Reason for visit; 6 months follow up  History of Present Illness:    [name] is a 78 y.o. 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 She was treated with radioactive iodine and stable since then. Her LVEF has improved. Echocardiogram in 2014 normal LVEF and severe pulmonary HTN with RVSP 63 mmHg. Repeat echo in 2016 showed improvement in PA pressure to normal 25 mmHg. She also has MVP and mild MR. She is the primary caregiver for her husband, who is also a pt of Dr. Francesca Oman. He has Parkinson's disease. It is a lot of stress for her.   12/18/2018 - The patient called about a week ago with concerns of dizziness and bradycardia down to 40s, her carvedilol was discontinued her heart rate today 61 bpm and she overall feels better.  She is concerned about elevated blood pressure, she has a diary with blood pressure in the morning in 140s, this is 1 hour before she takes her medications, and at night readings such as 118 120 mmHg.  She denies any chest pain shortness of breath palpitation lower extremity edema.  07/08/2019 - 6 months follow up, she is doing great, denies any chest pain, SOB, no palpitations, no dizziness or falls. No LE edema. She is complaint with her meds and has no side effects.   Past Medical History:  Diagnosis Date  . Abnormal EKG   . Atrial fibrillation ([place])   . Basal cell carcinoma of nose    removed w/MOHs  . CAD (coronary artery disease)    a. NSTEMI 06/2013 => LHC:  mLAD 40, oD2 70 (small), pOM 20, mRCA 20, mid to dist ant HK, EF 30-35% (c/w Tako-Tsubo CM)  . Cataract   . Chronic cystitis   . CIN I  (cervical intraepithelial neoplasia I)   . Dyslipidemia   . Ejection fraction    EF 65%, echo, 2011  . Heart murmur    MVP   . Hemorrhoids   . History of colon polyps   . HTN (hypertension)   . Hx of colonoscopy   . Hyperlipidemia   . Hyponatremia   . 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 IBS (irritable bowel syndrome)   . Lumbar vertebral fracture (HCC)   . Mitral valve prolapse    echo, January, 2011, moderate prolapse with your leaflet, trivial MR   Antibiotic required for procedures  . MVP (mitral valve prolapse)   . Orthostasis    Mild orthostatic change when she stands  . Osteoarthritis    "do not have RA" (06/17/2013)  . Osteoporosis 01/2014, 03/2019   01/2014 T score -2.3 followed by Dr. Marcelino Scot, 03/2019 T score -2.3  . Polymyalgia rheumatica ([place])   . PONV (postoperative nausea and vomiting)   . Potassium (K) deficiency    5 potassium requirement over time  . 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 Syncope    August, 200 weight, dehydration  . 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 EF 30-35% at [facility] 06/2013;  b.  f/u Echo (06/18/13):  EF 60-65%, normal wall motion, Gr 1 DD, mild MVP of post leaflet, mod TR, PASP 63  . Thyroid disease   . Tricuspid regurgitation    mild to moderate, echo, January, 2011, PA pressure 38 mm mercury    Past Surgical History:  Procedure Laterality Date  . CARDIAC CATHETERIZATION  06/17/2013  . COLONOSCOPY  12/23/2003   normal (indications: prior adenomas and grandfather with colon cancer)  . COLPOSCOPY    . EXCISIONAL HEMORRHOIDECTOMY  2003  . LEFT HEART CATHETERIZATION WITH CORONARY ANGIOGRAM N/A 06/17/2013   Procedure: LEFT HEART CATHETERIZATION WITH CORONARY ANGIOGRAM;  Surgeon: Peter M Martinique, MD;  Location: [facility] C[facility] LAB;  Service: Cardiovascular;  Laterality: N/A;  . MOHS SURGERY Left 2011   "side of my nose" (06/17/2013)  . TONSILLECTOMY    . TOTAL KNEE  ARTHROPLASTY  08/2010  . TOTAL KNEE ARTHROPLASTY  02/25/2012   Procedure: TOTAL KNEE ARTHROPLASTY;  Surgeon: Gearlean Alf, MD;  Location: [facility] ORS;  Service: Orthopedics;  Laterality: Right;    Current Medications: Current Meds  Medication Sig  . amLODipine (NORVASC) 5 MG tablet Take 1 tablet (5 mg total) by mouth daily with supper.  Marland Kitchen atorvastatin (LIPITOR) 10 MG tablet Take 1 tablet by mouth every day with breakfast.  . Biotin 5000 MCG CAPS Take 1 capsule by mouth daily.   . Calcium Carbonate (CALTRATE 600 PO) Take 1 tablet by mouth 2 (two) times a day.   . Cholecalciferol (D3-1000) 1000 UNITS capsule Take 1,000 Units by mouth daily.  Marland Kitchen denosumab (PROLIA) 60 MG/ML SOSY injection Inject 60 mg into the skin every 6 (six) months.  . diclofenac sodium (VOLTAREN) 1 % GEL 1 application.  . enalapril (VASOTEC) 20 MG tablet Take 1 tablet (20 mg total) by mouth 2 (two) times daily.  Marland Kitchen EVENING PRIMROSE OIL PO Take 1 capsule by mouth 2 (two) times a day.   . gabapentin (NEURONTIN) 100 MG capsule Take 500 mg by mouth daily.   . hydrochlorothiazide (HYDRODIURIL) 25 MG tablet Take 1 tablet (25 mg total) by mouth daily.  Marland Kitchen levothyroxine (SYNTHROID) 50 MCG tablet Take 1 tablet (50 mcg total) by mouth daily.  . meloxicam (MOBIC) 15 MG tablet Take 15 mg by mouth daily.  . methylcellulose packet Take 1 each by mouth daily. CITRICEL  . metroNIDAZOLE (METROCREAM) 0.75 % cream Apply 1 application topically 2 (two) times daily.  . pantoprazole (PROTONIX) 40 MG tablet Take 1 tablet by mouth daily.  . traMADol (ULTRAM) 50 MG tablet Take 50 mg by mouth as needed.  . tretinoin (RETIN-A) 0.05 % cream Apply 1 application topically at bedtime.  Marland Kitchen zolpidem (AMBIEN) 5 MG tablet Take 5 mg by mouth at bedtime as needed for sleep.     Allergies:   Patient has no known allergies.   Social History   Socioeconomic History  . Marital status: Married    Spouse name: Not on file  . Number of children: 1  . Years of  education: Not on file  . Highest education level: Not on file  Occupational History  . Occupation: Agricultural engineer  Social Needs  . Financial resource strain: Not on file  . Food insecurity    Worry: Not on file    Inability: Not on file  . Transportation needs    Medical: Not on file    Non-medical: Not on file  Tobacco Use  . Smoking status: Never Smoker  . Smokeless tobacco: Never Used  Substance  and Sexual Activity  . Alcohol use: No    Alcohol/week: 0.0 standard drinks  . Drug use: No  . Sexual activity: Never    Birth control/protection: Post-menopausal    Comment: 1st intercourse 78 yo-Fewer than 5 partners  Lifestyle  . Physical activity    Days per week: Not on file    Minutes per session: Not on file  . Stress: Not on file  Relationships  . Social Herbalist on phone: Not on file    Gets together: Not on file    Attends religious service: Not on file    Active member of club or organization: Not on file    Attends meetings of clubs or organizations: Not on file    Relationship status: Not on file  Other Topics Concern  . Not on file  Social History Narrative   3 caffeine drinks daily    Married   Retired           Family History: The patient's family history includes Breast cancer in her paternal aunt; Breast cancer (age of onset: 15) in her sister; Colon cancer in her maternal grandfather; Coronary artery disease in her father; Esophageal cancer in her paternal grandmother; Heart attack (age of onset: 64) in her mother; Heart attack (age of onset: 7) in her father; Heart disease in her father, maternal grandfather, mother, paternal grandfather, and sister; Hypertension in her father, mother, and sister; Irritable bowel syndrome in her sister; Stroke in her mother. There is no history of Rectal cancer or Stomach cancer.  ROS:   Please see the history of present illness.    All other systems reviewed and are negative.  EKGs/Labs/Other Studies  Reviewed:    The following studies were reviewed today:  EKG:  EKG is ordered today.  The ekg ordered today demonstrates sinus bradycardia unchanged from prior, personally reviewed.  Recent Labs: 06/29/2019: TSH 3.88  Recent Lipid Panel    Component Value Date/Time   CHOL 160 06/03/2018 1000   TRIG 67 06/03/2018 1000   TRIG 69 05/27/2006 0928   HDL 77 06/03/2018 1000   CHOLHDL 2.1 06/03/2018 1000   CHOLHDL 2 01/13/2015 0918   VLDL 11.6 01/13/2015 0918   LDLCALC 70 06/03/2018 1000    Physical Exam:    VS:  BP 130/84   Pulse (!) 57   Ht 5\' 4"  (1.626 m)   Wt 147 lb 3.2 oz (66.8 kg)   SpO2 97%   BMI 25.27 kg/m     Wt Readings from Last 3 Encounters:  07/08/19 147 lb 3.2 oz (66.8 kg)  05/18/19 149 lb (67.6 kg)  12/18/18 150 lb (68 kg)     GEN:  Well nourished, well developed in no acute distress HEENT: Normal NECK: No JVD; No carotid bruits LYMPHATICS: No lymphadenopathy CARDIAC: RRR, 2/6 systolic murmurs, rubs, gallops RESPIRATORY:  Clear to auscultation without rales, wheezing or rhonchi  ABDOMEN: Soft, non-tender, non-distended MUSCULOSKELETAL:  No edema; No deformity  SKIN: Warm and dry NEUROLOGIC:  Alert and oriented x 3 PSYCHIATRIC:  Normal affect   ASSESSMENT:    1. Hypertension, essential   2. Mixed hyperlipidemia    PLAN:    In order of problems listed above:  1.  Paroxysmal atrial fibrillation -only one episode triggered by Graves' disease.  Now in sinus bradycardia.  She is asymptomatic.  2. H/o graves disease: TSH normalized after she was treated with radioactive iodine. This has been followed by an endocrinologist, most  recent TSH and free T4 in April 2020 within normal limits.   On Synthroid.  3. Takotsubo CMP:  Patient was hospitalized in November, 2014. Catheterization revealed no significant coronary disease. Wall motion abnormalities suggested Takot-tsubo. Her LV function normalized very rapidly within a few days.  She has no recurrency and  is functional class I.  4. Hyperlipidemia: on statin therapy w/ atorvastatin. Well tolerated, lipids at goal HDL 89, LDL 71, TG 53. Normal LFTs.  5. HTN: controlled on current regimen.    7. MVP/MR: mild MR noted on echo in 2016. Stable minimal murmur. No need for repeat echocardiogram.   Medication Adjustments/Labs and Tests Ordered: Current medicines are reviewed at length with the patient today.  Concerns regarding medicines are outlined above.  Orders Placed This Encounter  Procedures  . EKG 12-Lead   No orders of the defined types were placed in this encounter.   Patient Instructions  Medication Instructions:   Your physician recommends that you continue on your current medications as directed. Please refer to the Current Medication list given to you today.  *If you need a refill on your cardiac medications before your next appointment, please call your pharmacy*    Follow-Up: At [facility], you and your health needs are our priority.  As part of our continuing mission to provide you with exceptional heart care, we have created designated Provider Care Teams.  These Care Teams include your primary Cardiologist (physician) and Advanced Practice Providers (APPs -  Physician Assistants and Nurse Practitioners) who all work together to provide you with the care you need, when you need it.  Your next appointment:   6 month(s)  The format for your next appointment:   Either In Person or Virtual  Provider:   Ena Dawley, MD       Signed, Ena Dawley, MD  07/08/2019 12:06 PM    [place]

## 2019-07-08 NOTE — Patient Instructions (Signed)
Medication Instructions:   Your physician recommends that you continue on your current medications as directed. Please refer to the Current Medication list given to you today.  *If you need a refill on your cardiac medications before your next appointment, please call your pharmacy*    Follow-Up: At [facility], you and your health needs are our priority.  As part of our continuing mission to provide you with exceptional heart care, we have created designated Provider Care Teams.  These Care Teams include your primary Cardiologist (physician) and Advanced Practice Providers (APPs -  Physician Assistants and Nurse Practitioners) who all work together to provide you with the care you need, when you need it.  Your next appointment:   6 month(s)  The format for your next appointment:   Either In Person or Virtual  Provider:   Ena Dawley, MD

## 2019-07-20 ENCOUNTER — Other Ambulatory Visit: Payer: Self-pay | Admitting: Internal Medicine

## 2019-08-12 ENCOUNTER — Other Ambulatory Visit: Payer: Self-pay | Admitting: Obstetrics & Gynecology

## 2019-08-12 DIAGNOSIS — Z1231 Encounter for screening mammogram for malignant neoplasm of breast: Secondary | ICD-10-CM

## 2019-08-13 DIAGNOSIS — Z85828 Personal history of other malignant neoplasm of skin: Secondary | ICD-10-CM | POA: Diagnosis not present

## 2019-08-13 DIAGNOSIS — D2262 Melanocytic nevi of left upper limb, including shoulder: Secondary | ICD-10-CM | POA: Diagnosis not present

## 2019-08-13 DIAGNOSIS — Z419 Encounter for procedure for purposes other than remedying health state, unspecified: Secondary | ICD-10-CM | POA: Diagnosis not present

## 2019-08-13 DIAGNOSIS — L821 Other seborrheic keratosis: Secondary | ICD-10-CM | POA: Diagnosis not present

## 2019-08-13 DIAGNOSIS — D1801 Hemangioma of skin and subcutaneous tissue: Secondary | ICD-10-CM | POA: Diagnosis not present

## 2019-08-13 DIAGNOSIS — D2272 Melanocytic nevi of left lower limb, including hip: Secondary | ICD-10-CM | POA: Diagnosis not present

## 2019-08-13 DIAGNOSIS — L718 Other rosacea: Secondary | ICD-10-CM | POA: Diagnosis not present

## 2019-08-13 DIAGNOSIS — L738 Other specified follicular disorders: Secondary | ICD-10-CM | POA: Diagnosis not present

## 2019-08-18 ENCOUNTER — Other Ambulatory Visit: Payer: PPO

## 2019-08-28 DIAGNOSIS — H52203 Unspecified astigmatism, bilateral: Secondary | ICD-10-CM | POA: Diagnosis not present

## 2019-08-28 DIAGNOSIS — Z961 Presence of intraocular lens: Secondary | ICD-10-CM | POA: Diagnosis not present

## 2019-09-22 ENCOUNTER — Other Ambulatory Visit: Payer: Self-pay | Admitting: Cardiology

## 2019-09-22 DIAGNOSIS — I5181 Takotsubo syndrome: Secondary | ICD-10-CM

## 2019-09-22 MED ORDER — AMLODIPINE BESYLATE 5 MG PO TABS: 5.0000 mg | ORAL_TABLET | Freq: Every day | ORAL | 3 refills | Status: DC

## 2019-09-29 ENCOUNTER — Other Ambulatory Visit: Payer: Self-pay

## 2019-09-29 ENCOUNTER — Other Ambulatory Visit: Payer: Self-pay | Admitting: Internal Medicine

## 2019-09-29 ENCOUNTER — Other Ambulatory Visit (INDEPENDENT_AMBULATORY_CARE_PROVIDER_SITE_OTHER): Payer: PPO

## 2019-09-29 DIAGNOSIS — E890 Postprocedural hypothyroidism: Secondary | ICD-10-CM

## 2019-09-29 LAB — T4, FREE: Free T4: 0.86 ng/dL (ref 0.60–1.60)

## 2019-09-29 LAB — TSH: TSH: 8.69 u[IU]/mL — ABNORMAL HIGH (ref 0.35–4.50)

## 2019-09-29 MED ORDER — LEVOTHYROXINE SODIUM 75 MCG PO TABS: 75.0000 ug | ORAL_TABLET | Freq: Every day | ORAL | 3 refills | Status: DC

## 2019-09-30 ENCOUNTER — Ambulatory Visit: Payer: PPO

## 2019-10-08 DIAGNOSIS — M810 Age-related osteoporosis without current pathological fracture: Secondary | ICD-10-CM | POA: Diagnosis not present

## 2019-10-30 ENCOUNTER — Ambulatory Visit
Admission: RE | Admit: 2019-10-30 | Discharge: 2019-10-30 | Disposition: A | Discharge status: Home / Self Care | Payer: PPO | Source: Ambulatory Visit | Attending: Obstetrics & Gynecology | Admitting: Obstetrics & Gynecology

## 2019-10-30 ENCOUNTER — Other Ambulatory Visit: Payer: Self-pay

## 2019-10-30 DIAGNOSIS — Z1231 Encounter for screening mammogram for malignant neoplasm of breast: Secondary | ICD-10-CM | POA: Diagnosis not present

## 2019-11-09 DIAGNOSIS — M5126 Other intervertebral disc displacement, lumbar region: Secondary | ICD-10-CM | POA: Diagnosis not present

## 2019-11-09 DIAGNOSIS — M4316 Spondylolisthesis, lumbar region: Secondary | ICD-10-CM | POA: Diagnosis not present

## 2019-11-09 DIAGNOSIS — S32049A Unspecified fracture of fourth lumbar vertebra, initial encounter for closed fracture: Secondary | ICD-10-CM | POA: Diagnosis not present

## 2019-11-09 DIAGNOSIS — M545 Low back pain: Secondary | ICD-10-CM | POA: Diagnosis not present

## 2019-11-10 ENCOUNTER — Other Ambulatory Visit: Payer: Self-pay

## 2019-11-10 ENCOUNTER — Other Ambulatory Visit (INDEPENDENT_AMBULATORY_CARE_PROVIDER_SITE_OTHER): Payer: PPO

## 2019-11-10 DIAGNOSIS — E890 Postprocedural hypothyroidism: Secondary | ICD-10-CM

## 2019-11-10 LAB — T4, FREE: Free T4: 1.02 ng/dL (ref 0.60–1.60)

## 2019-11-10 LAB — TSH: TSH: 1.47 u[IU]/mL (ref 0.35–4.50)

## 2019-11-16 MED ORDER — CARVEDILOL 6.25 MG PO TABS: 6.2500 mg | ORAL_TABLET | Freq: Two times a day (BID) | ORAL | 0 refills | Status: DC

## 2019-11-16 NOTE — Telephone Encounter (Signed)
Alexis Spark, MD   11 hours ago (9:08 PM)   Lets start carvedilol 6.25 mg PO BID, please send me your diary in 2 weeks after you start     Pts carvedilol 6.25 mg po bid was sent to her pharmacy on file.  Pt made aware that this medication was sent in, and she is to diary her BP for 2 weeks, then mychart them to [place] thereafter.  Pt made aware via her mychart account.

## 2019-11-23 ENCOUNTER — Other Ambulatory Visit: Payer: Self-pay

## 2019-11-24 ENCOUNTER — Ambulatory Visit (INDEPENDENT_AMBULATORY_CARE_PROVIDER_SITE_OTHER): Payer: PPO | Admitting: Obstetrics and Gynecology

## 2019-11-24 ENCOUNTER — Encounter: Payer: Self-pay | Admitting: Obstetrics and Gynecology

## 2019-11-24 VITALS — BP 124/80 | Ht 63.5 in | Wt 148.0 lb

## 2019-11-24 DIAGNOSIS — M810 Age-related osteoporosis without current pathological fracture: Secondary | ICD-10-CM | POA: Diagnosis not present

## 2019-11-24 DIAGNOSIS — Z01419 Encounter for gynecological examination (general) (routine) without abnormal findings: Secondary | ICD-10-CM

## 2019-11-24 NOTE — Progress Notes (Signed)
[name] 1941/01/10 [id number]  SUBJECTIVE:  79 y.o. G3P3001 female for annual routine gynecologic exam. She has no gynecologic concerns.  She recently lost her husband earlier this year due to to advancing Alzheimer disease.  Current Outpatient Medications  Medication Sig Dispense Refill  . amLODipine (NORVASC) 5 MG tablet Take 1 tablet (5 mg total) by mouth daily with supper. 90 tablet 3  . atorvastatin (LIPITOR) 10 MG tablet Take 1 tablet by mouth every day with breakfast. 90 tablet 3  . Biotin 5000 MCG CAPS Take 1 capsule by mouth daily.     . Calcium Carbonate (CALTRATE 600 PO) Take 1 tablet by mouth 2 (two) times a day.     . carvedilol (COREG) 6.25 MG tablet Take 1 tablet (6.25 mg total) by mouth 2 (two) times daily with a meal. 180 tablet 0  . Cholecalciferol (D3-1000) 1000 UNITS capsule Take 1,000 Units by mouth daily.    Marland Kitchen denosumab (PROLIA) 60 MG/ML SOSY injection Inject 60 mg into the skin every 6 (six) months.    . diclofenac sodium (VOLTAREN) 1 % GEL 1 application.  2  . enalapril (VASOTEC) 20 MG tablet Take 1 tablet (20 mg total) by mouth 2 (two) times daily. 180 tablet 2  . EVENING PRIMROSE OIL PO Take 1 capsule by mouth 2 (two) times a day.     . gabapentin (NEURONTIN) 100 MG capsule Take 500 mg by mouth daily.   3  . hydrochlorothiazide (HYDRODIURIL) 25 MG tablet Take 1 tablet (25 mg total) by mouth daily. 90 tablet 3  . levothyroxine (SYNTHROID) 75 MCG tablet Take 1 tablet (75 mcg total) by mouth daily. 45 tablet 3  . meloxicam (MOBIC) 15 MG tablet Take 15 mg by mouth daily.    . methylcellulose packet Take 1 each by mouth daily. CITRICEL    . metroNIDAZOLE (METROCREAM) 0.75 % cream Apply 1 application topically 2 (two) times daily.    . pantoprazole (PROTONIX) 40 MG tablet Take 1 tablet by mouth daily.    Marland Kitchen tretinoin (RETIN-A) 0.05 % cream Apply 1 application topically at bedtime.    Marland Kitchen zolpidem (AMBIEN) 5 MG tablet Take 5 mg by mouth at bedtime as needed for  sleep.     No current facility-administered medications for this visit.   Allergies: Patient has no known allergies.  No LMP recorded. Patient is postmenopausal.  Past medical history,surgical history, problem list, medications, allergies, family history and social history were all reviewed and documented as reviewed in the EPIC chart.  ROS:  Feeling well. No dyspnea or chest pain on exertion.  No abdominal pain, change in bowel habits, black or bloody stools.  No urinary tract symptoms. GYN ROS: no abnormal bleeding, pelvic pain or discharge, no breast pain or new or enlarging lumps on self exam. No neurological complaints.  OBJECTIVE:  BP 124/80   Ht 5' 3.5" (1.613 m)   Wt 148 lb (67.1 kg)   BMI 25.81 kg/m  The patient appears well, alert, oriented x 3, in no distress. ENT normal.  Neck supple. No cervical or supraclavicular adenopathy or thyromegaly.  Lungs are clear, good air entry, no wheezes, rhonchi or rales. S1 and S2 normal, no murmurs, regular rate and rhythm.  Abdomen soft without tenderness, guarding, mass or organomegaly.  Neurological is normal, no focal findings.  BREAST EXAM: breasts appear normal, no suspicious masses, no skin or nipple changes or axillary nodes  PELVIC EXAM: VULVA: normal appearing vulva with no masses, tenderness  or lesions, VAGINA: normal appearing vagina with normal color and discharge, no lesions, CERVIX: normal appearing cervix without discharge or lesions, UTERUS: uterus is normal size, shape, consistency and nontender, ADNEXA: normal adnexa in size, nontender and no masses, RECTAL: normal rectal, no masses  Chaperone: [name] present during the examination  ASSESSMENT:  79 y.o. G3P3001 here for annual gynecologic exam  PLAN:   1. Postmenopausal.  No hot flashes or night sweats.  No vaginal bleeding. 2. Pap smear 07/2011.  History of low-grade dysplasia more than 40 years ago with normal Pap smears since then.  We discussed that she may  discontinue screening based on expert guidelines following the age criteria and more recent Pap smear history, and she agrees to not continue screening at this time. 3. Mammogram 10/2019.  Normal breast exam today.  Continue annual mammography.   4. Colonoscopy 2019.  Recommended that she follow up at the recommended interval.   5. DEXA 03/2019.  Overall stable BMD with T-score -2.8 at left forearm, otherwise all other sites osteopenic.  Spine excluded due to degenerative changes.   Started on Prolia by Dr. Earlie Counts in [place] who has been following her, and she is welcome to have surveillance here in our office.  I would endorse continuing Prolia right now.  Recommend the next DEXA scan next year in 2022. 6. Health maintenance.  No labs today as she normally has these completed swear.  Return annually or sooner, prn.  Joseph Pierini MD 11/24/19

## 2019-11-26 ENCOUNTER — Telehealth: Payer: Self-pay | Admitting: *Deleted

## 2019-11-26 DIAGNOSIS — R300 Dysuria: Secondary | ICD-10-CM | POA: Diagnosis not present

## 2019-11-26 NOTE — Telephone Encounter (Signed)
Patient called requesting u/a results from [place] on 11/24/19,I explained to patient no urine was done at this visit. Patient was not having any UTI symptoms nor did she requesting urine checked. I explained office protocol regarding u/a. I did explain she could come back to have urine checked it she would like.

## 2019-11-30 ENCOUNTER — Telehealth: Payer: Self-pay | Admitting: Cardiology

## 2019-11-30 NOTE — Telephone Encounter (Signed)
These numbers are much better than previous ones, if she is tolerating it well, she should continue this regimen

## 2019-11-30 NOTE — Telephone Encounter (Signed)
Patient called to give her BP reading:  4/13 128/82  HR 51  127/67  HR 56 4/14 135/75  HR 47  100/60  HR 60 4/15 162/89  HR 52  138/80  HR 63 4/16 144/83  HR 53  NO PM READING 4/17 125/74  HR53  116/76  HR70 4/18 141/82  HR52  NO PM READING 4/19 115/75  HR 51  103/64  HR 69 4/20 132/69  HR 53  101/62  HR 55 4/21 132/69  HR 63  87/46  HR 67 4/22 123/71  HR 52  117/62  HR 64 4/23 115/78  HR 43  120/78  HR 63 4/24 130/72  HR 47  104/57  HR 63 4/25  146/82  HR 46  143/78  HR 67 4/26 137/72  HR 55

## 2019-11-30 NOTE — Telephone Encounter (Signed)
Dr. Meda Coffee, pt is sending you in 2 weeks worth of logged BP/HR readings you advised her to send, after you increased her carvedilol to 6.25 mg po bid.  This was advised by you to her on last mychart message from 11/12/19.  Please review readings and advise if she should continue this current regimen or make further changes. Thanks!

## 2019-12-01 NOTE — Telephone Encounter (Signed)
Spoke with the pt and informed her that per Dr. Meda Coffee, her numbers are much better than previous ones, and if she is tolerating her carvedilol dose, then she recommends that she continue that current med regimen. Pt states she is tolerating her carvedilol dose very well, and feels much better restarting this medication, and restarting this at an increased dose.  Pt states she will continue with her current regimen. Pt verbalized understanding and agrees with this plan.

## 2019-12-02 DIAGNOSIS — N390 Urinary tract infection, site not specified: Secondary | ICD-10-CM | POA: Diagnosis not present

## 2019-12-02 DIAGNOSIS — R300 Dysuria: Secondary | ICD-10-CM | POA: Diagnosis not present

## 2019-12-07 ENCOUNTER — Other Ambulatory Visit: Payer: Self-pay

## 2019-12-07 MED ORDER — ENALAPRIL MALEATE 20 MG PO TABS: 20.0000 mg | ORAL_TABLET | Freq: Two times a day (BID) | ORAL | 2 refills | Status: DC

## 2019-12-16 DIAGNOSIS — M7742 Metatarsalgia, left foot: Secondary | ICD-10-CM | POA: Diagnosis not present

## 2019-12-16 DIAGNOSIS — M79672 Pain in left foot: Secondary | ICD-10-CM | POA: Diagnosis not present

## 2020-01-04 ENCOUNTER — Other Ambulatory Visit: Payer: Self-pay | Admitting: Internal Medicine

## 2020-01-05 ENCOUNTER — Encounter: Payer: Self-pay | Admitting: Internal Medicine

## 2020-01-06 MED ORDER — LEVOTHYROXINE SODIUM 75 MCG PO TABS: 75.0000 ug | ORAL_TABLET | Freq: Every day | ORAL | 3 refills | Status: DC

## 2020-01-18 DIAGNOSIS — M2040 Other hammer toe(s) (acquired), unspecified foot: Secondary | ICD-10-CM | POA: Diagnosis not present

## 2020-01-18 DIAGNOSIS — M7742 Metatarsalgia, left foot: Secondary | ICD-10-CM | POA: Diagnosis not present

## 2020-01-18 DIAGNOSIS — M19079 Primary osteoarthritis, unspecified ankle and foot: Secondary | ICD-10-CM | POA: Diagnosis not present

## 2020-01-18 DIAGNOSIS — M2021 Hallux rigidus, right foot: Secondary | ICD-10-CM | POA: Diagnosis not present

## 2020-02-06 ENCOUNTER — Other Ambulatory Visit: Payer: Self-pay | Admitting: Cardiology

## 2020-02-17 ENCOUNTER — Other Ambulatory Visit: Payer: Self-pay

## 2020-02-17 DIAGNOSIS — I480 Paroxysmal atrial fibrillation: Secondary | ICD-10-CM

## 2020-02-17 DIAGNOSIS — I5181 Takotsubo syndrome: Secondary | ICD-10-CM

## 2020-02-17 MED ORDER — ATORVASTATIN CALCIUM 10 MG PO TABS: ORAL_TABLET | ORAL | 1 refills | Status: DC

## 2020-02-17 MED ORDER — HYDROCHLOROTHIAZIDE 25 MG PO TABS: 25.0000 mg | ORAL_TABLET | Freq: Every day | ORAL | 1 refills | Status: DC

## 2020-02-22 DIAGNOSIS — M150 Primary generalized (osteo)arthritis: Secondary | ICD-10-CM | POA: Diagnosis not present

## 2020-02-22 DIAGNOSIS — M5136 Other intervertebral disc degeneration, lumbar region: Secondary | ICD-10-CM | POA: Diagnosis not present

## 2020-02-22 DIAGNOSIS — Z6825 Body mass index (BMI) 25.0-25.9, adult: Secondary | ICD-10-CM | POA: Diagnosis not present

## 2020-02-22 DIAGNOSIS — E663 Overweight: Secondary | ICD-10-CM | POA: Diagnosis not present

## 2020-02-22 DIAGNOSIS — M810 Age-related osteoporosis without current pathological fracture: Secondary | ICD-10-CM | POA: Diagnosis not present

## 2020-02-22 DIAGNOSIS — M353 Polymyalgia rheumatica: Secondary | ICD-10-CM | POA: Diagnosis not present

## 2020-03-01 ENCOUNTER — Other Ambulatory Visit: Payer: Self-pay

## 2020-04-05 ENCOUNTER — Ambulatory Visit: Payer: PPO

## 2020-04-06 ENCOUNTER — Other Ambulatory Visit: Payer: Self-pay

## 2020-04-06 ENCOUNTER — Ambulatory Visit: Payer: PPO | Admitting: Cardiology

## 2020-04-06 ENCOUNTER — Encounter: Payer: Self-pay | Admitting: Cardiology

## 2020-04-06 VITALS — BP 136/84 | HR 62 | Ht 64.0 in | Wt 144.0 lb

## 2020-04-06 DIAGNOSIS — I341 Nonrheumatic mitral (valve) prolapse: Secondary | ICD-10-CM

## 2020-04-06 DIAGNOSIS — E785 Hyperlipidemia, unspecified: Secondary | ICD-10-CM

## 2020-04-06 DIAGNOSIS — I340 Nonrheumatic mitral (valve) insufficiency: Secondary | ICD-10-CM

## 2020-04-06 DIAGNOSIS — I480 Paroxysmal atrial fibrillation: Secondary | ICD-10-CM | POA: Diagnosis not present

## 2020-04-06 DIAGNOSIS — I10 Essential (primary) hypertension: Secondary | ICD-10-CM | POA: Diagnosis not present

## 2020-04-06 NOTE — Patient Instructions (Signed)
Medication Instructions:   Your physician recommends that you continue on your current medications as directed. Please refer to the Current Medication list given to you today.  *If you need a refill on your cardiac medications before your next appointment, please call your pharmacy*  Testing/Procedures:  Your physician has requested that you have an echocardiogram. Echocardiography is a painless test that uses sound waves to create images of your heart. It provides your doctor with information about the size and shape of your heart and how well your heart's chambers and valves are working. This procedure takes approximately one hour. There are no restrictions for this procedure.   Follow-Up: At [facility], you and your health needs are our priority.  As part of our continuing mission to provide you with exceptional heart care, we have created designated Provider Care Teams.  These Care Teams include your primary Cardiologist (physician) and Advanced Practice Providers (APPs -  Physician Assistants and Nurse Practitioners) who all work together to provide you with the care you need, when you need it.  We recommend signing up for the patient portal called "MyChart".  Sign up information is provided on this After Visit Summary.  MyChart is used to connect with patients for Virtual Visits (Telemedicine).  Patients are able to view lab/test results, encounter notes, upcoming appointments, etc.  Non-urgent messages can be sent to your provider as well.   To learn more about what you can do with MyChart, go to [email or web address].    Your next appointment:   12 month(s)  The format for your next appointment:   In Person  Provider:   Ena Dawley, MD

## 2020-04-06 NOTE — Progress Notes (Signed)
Cardiology Office Note:    Date:  04/06/2020   ID:  [name], [place] March 06, 1941, MRN [id number]  PCP:  Deland Pretty, MD  Cardiologist:  No primary care provider on file.  Electrophysiologist:  None   Referring MD: Deland Pretty, MD   Reason for visit: 6 months follow up  History of Present Illness:    [name] is a 79 y.o. 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 She was treated with radioactive iodine and stable since then. Her LVEF has improved. Echocardiogram in 2014 normal LVEF and severe pulmonary HTN with RVSP 63 mmHg. Repeat echo in 2016 showed improvement in PA pressure to normal 25 mmHg. She also has moderate MVP and mild mitral regurgitation on echo in 2016. She is mourning the loss of her husband who recently passed away. She is otherwise doing great, denies any chest pain shortness of breath no palpitations no lower extremity edema claudication. No falls. She stopped taking carvedilol as she became profoundly bradycardic. She feels like she has more energy now.  Past Medical History:  Diagnosis Date  . Abnormal EKG   . Atrial fibrillation ([place])   . Basal cell carcinoma of nose    removed w/MOHs  . CAD (coronary artery disease)    a. NSTEMI 06/2013 => LHC:  mLAD 40, oD2 70 (small), pOM 20, mRCA 20, mid to dist ant HK, EF 30-35% (c/w Tako-Tsubo CM)  . Cataract   . Chronic cystitis   . CIN I (cervical intraepithelial neoplasia I)   . Dyslipidemia   . Ejection fraction    EF 65%, echo, 2011  . Heart murmur    MVP   . Hemorrhoids   . History of colon polyps   . HTN (hypertension)   . Hx of colonoscopy   . Hyperlipidemia   . Hyponatremia   . 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 IBS (irritable bowel syndrome)   . Lumbar vertebral fracture (HCC)   . Mitral valve prolapse    echo, January, 2011, moderate  prolapse with your leaflet, trivial MR   Antibiotic required for procedures  . MVP (mitral valve prolapse)   . Orthostasis    Mild orthostatic change when she stands  . Osteoarthritis    "do not have RA" (06/17/2013)  . Osteoporosis 01/2014, 03/2019   01/2014 T score -2.3 followed by Dr. Marcelino Scot, 03/2019 T score -2.3  . Polymyalgia rheumatica ([place])   . PONV (postoperative nausea and vomiting)   . Potassium (K) deficiency    5 potassium requirement over time  . 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 Syncope    August, 200 weight, dehydration  . 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 EF 30-35% at [facility] 06/2013;  b.  f/u Echo (06/18/13):  EF 60-65%, normal wall motion, Gr 1 DD, mild MVP of post leaflet, mod TR, PASP 63  . Thyroid disease   . Tricuspid regurgitation    mild to moderate, echo, January, 2011, PA pressure 38 mm mercury    Past Surgical History:  Procedure Laterality Date  . CARDIAC CATHETERIZATION  06/17/2013  . COLONOSCOPY  12/23/2003   normal (indications: prior adenomas and grandfather with colon cancer)  . COLPOSCOPY    . EXCISIONAL HEMORRHOIDECTOMY  2003  . LEFT HEART CATHETERIZATION WITH CORONARY ANGIOGRAM  N/A 06/17/2013   Procedure: LEFT HEART CATHETERIZATION WITH CORONARY ANGIOGRAM;  Surgeon: Peter M Martinique, MD;  Location: [facility] CATH LAB;  Service: Cardiovascular;  Laterality: N/A;  . MOHS SURGERY Left 2011   "side of my nose" (06/17/2013)  . TONSILLECTOMY    . TOTAL KNEE ARTHROPLASTY  08/2010  . TOTAL KNEE ARTHROPLASTY  02/25/2012   Procedure: TOTAL KNEE ARTHROPLASTY;  Surgeon: Gearlean Alf, MD;  Location: [facility] ORS;  Service: Orthopedics;  Laterality: Right;    Current Medications: Current Meds  Medication Sig  . amLODipine (NORVASC) 5 MG tablet Take 1 tablet (5 mg total) by mouth daily with supper.  Marland Kitchen atorvastatin (LIPITOR) 10 MG tablet Take 1 tablet by mouth every day with breakfast.  . Biotin 5000 MCG CAPS Take 1  capsule by mouth daily.   . Calcium Carbonate (CALTRATE 600 PO) Take 1 tablet by mouth 2 (two) times a day.   . Cholecalciferol (D3-1000) 1000 UNITS capsule Take 1,000 Units by mouth daily.  Marland Kitchen denosumab (PROLIA) 60 MG/ML SOSY injection Inject 60 mg into the skin every 6 (six) months.  . diclofenac sodium (VOLTAREN) 1 % GEL 1 application.  . enalapril (VASOTEC) 20 MG tablet Take 1 tablet (20 mg total) by mouth 2 (two) times daily.  Marland Kitchen EVENING PRIMROSE OIL PO Take 1 capsule by mouth 2 (two) times a day.   . gabapentin (NEURONTIN) 100 MG capsule Take 500 mg by mouth daily.   . hydrochlorothiazide (HYDRODIURIL) 25 MG tablet Take 1 tablet (25 mg total) by mouth daily.  Marland Kitchen levothyroxine (SYNTHROID) 75 MCG tablet Take 1 tablet (75 mcg total) by mouth daily.  Marland Kitchen MELATONIN PO Take by mouth as needed.  . meloxicam (MOBIC) 15 MG tablet Take 15 mg by mouth daily.  . methylcellulose packet Take 1 each by mouth daily. CITRICEL  . metroNIDAZOLE (METROCREAM) 0.75 % cream Apply 1 application topically 2 (two) times daily.  . pantoprazole (PROTONIX) 40 MG tablet Take 1 tablet by mouth daily.  Marland Kitchen tretinoin (RETIN-A) 0.05 % cream Apply 1 application topically at bedtime.  Marland Kitchen zolpidem (AMBIEN) 5 MG tablet Take 5 mg by mouth at bedtime as needed for sleep.     Allergies:   Patient has no known allergies.   Social History   Socioeconomic History  . Marital status: Widowed    Spouse name: Not on file  . Number of children: 1  . Years of education: Not on file  . Highest education level: Not on file  Occupational History  . Occupation: Homemaker  Tobacco Use  . Smoking status: Never Smoker  . Smokeless tobacco: Never Used  Vaping Use  . Vaping Use: Never used  Substance and Sexual Activity  . Alcohol use: Yes    Alcohol/week: 0.0 standard drinks    Comment: Rare  . Drug use: No  . Sexual activity: Never    Birth control/protection: Post-menopausal    Comment: 1st intercourse 79 yo-Fewer than 5 partners    Other Topics Concern  . Not on file  Social History Narrative   3 caffeine drinks daily    Married   Retired         Scientist, physiological Strain:   . Difficulty of Paying Living Expenses: Not on file  Food Insecurity:   . Worried About Charity fundraiser in the Last Year: Not on file  . Ran Out of Food in the Last Year: Not on file  Transportation Needs:   .  Lack of Transportation (Medical): Not on file  . Lack of Transportation (Non-Medical): Not on file  Physical Activity:   . Days of Exercise per Week: Not on file  . Minutes of Exercise per Session: Not on file  Stress:   . Feeling of Stress : Not on file  Social Connections:   . Frequency of Communication with Friends and Family: Not on file  . Frequency of Social Gatherings with Friends and Family: Not on file  . Attends Religious Services: Not on file  . Active Member of Clubs or Organizations: Not on file  . Attends Archivist Meetings: Not on file  . Marital Status: Not on file     Family History: The patient's family history includes Breast cancer in her paternal aunt; Breast cancer (age of onset: 34) in her sister; Colon cancer in her maternal grandfather; Coronary artery disease in her father; Esophageal cancer in her paternal grandmother; Heart attack (age of onset: 48) in her mother; Heart attack (age of onset: 22) in her father; Heart disease in her father, maternal grandfather, mother, paternal grandfather, and sister; Hypertension in her father, mother, and sister; Irritable bowel syndrome in her sister; Stroke in her mother. There is no history of Rectal cancer or Stomach cancer.  ROS:   Please see the history of present illness.    All other systems reviewed and are negative.  EKGs/Labs/Other Studies Reviewed:    The following studies were reviewed today:  EKG:  EKG is ordered today.  The ekg ordered today demonstrates sinus bradycardia unchanged from prior,  personally reviewed.  Recent Labs: 11/10/2019: TSH 1.47  Recent Lipid Panel    Component Value Date/Time   CHOL 160 06/03/2018 1000   TRIG 67 06/03/2018 1000   TRIG 69 05/27/2006 0928   HDL 77 06/03/2018 1000   CHOLHDL 2.1 06/03/2018 1000   CHOLHDL 2 01/13/2015 0918   VLDL 11.6 01/13/2015 0918   LDLCALC 70 06/03/2018 1000    Physical Exam:    VS:  BP 136/84   Pulse 62   Ht 5\' 4"  (1.626 m)   Wt 144 lb (65.3 kg)   SpO2 98%   BMI 24.72 kg/m     Wt Readings from Last 3 Encounters:  04/06/20 144 lb (65.3 kg)  11/24/19 148 lb (67.1 kg)  07/08/19 147 lb 3.2 oz (66.8 kg)     GEN:  Well nourished, well developed in no acute distress HEENT: Normal NECK: No JVD; No carotid bruits LYMPHATICS: No lymphadenopathy CARDIAC: RRR, 2/6 systolic murmurs, rubs, gallops RESPIRATORY:  Clear to auscultation without rales, wheezing or rhonchi  ABDOMEN: Soft, non-tender, non-distended MUSCULOSKELETAL:  No edema; No deformity  SKIN: Warm and dry NEUROLOGIC:  Alert and oriented x 3 PSYCHIATRIC:  Normal affect   ASSESSMENT:    1. MVP (mitral valve prolapse)   2. Paroxysmal atrial fibrillation (HCC)   3. Hyperlipidemia, unspecified hyperlipidemia type   4. Mitral valve insufficiency, unspecified etiology   5. Hypertension, essential    PLAN:    In order of problems listed above:  1.  Paroxysmal atrial fibrillation -only one episode triggered by Graves' disease.  Now in sinus bradycardia.  She is asymptomatic. Not on anticoagulation.  2. H/o graves disease: TSH normalized after she was treated with radioactive iodine. This has been followed by an endocrinologist, most recent TSH and free T4 in April 2020 within normal limits.   On Synthroid.  3. Takotsubo CMP:  Patient was hospitalized in November, 2014. Catheterization  revealed no significant coronary disease. Wall motion abnormalities suggested Takot-tsubo. Her LV function normalized very rapidly within a few days.  She has no  recurrency and is functional class I.  4. Hyperlipidemia: on statin therapy w/ atorvastatin. Well tolerated, lipids at goal HDL 89, LDL 71, TG 53. Normal LFTs.  5. HTN: controlled on current regimen.    7. MVP/MR: Moderate mitral valve prolapse with mild MR noted on echo in 2016. Stable 2 out of 6 murmur. I will repeat echocardiogram since it has been 6 years since the last one. She is asymptomatic.   Medication Adjustments/Labs and Tests Ordered: Current medicines are reviewed at length with the patient today.  Concerns regarding medicines are outlined above.  Orders Placed This Encounter  Procedures  . EKG 12-Lead  . ECHOCARDIOGRAM COMPLETE   No orders of the defined types were placed in this encounter.  Patient Instructions  Medication Instructions:   Your physician recommends that you continue on your current medications as directed. Please refer to the Current Medication list given to you today.  *If you need a refill on your cardiac medications before your next appointment, please call your pharmacy*  Testing/Procedures:  Your physician has requested that you have an echocardiogram. Echocardiography is a painless test that uses sound waves to create images of your heart. It provides your doctor with information about the size and shape of your heart and how well your heart's chambers and valves are working. This procedure takes approximately one hour. There are no restrictions for this procedure.   Follow-Up: At [facility], you and your health needs are our priority.  As part of our continuing mission to provide you with exceptional heart care, we have created designated Provider Care Teams.  These Care Teams include your primary Cardiologist (physician) and Advanced Practice Providers (APPs -  Physician Assistants and Nurse Practitioners) who all work together to provide you with the care you need, when you need it.  We recommend signing up for the patient portal called  "MyChart".  Sign up information is provided on this After Visit Summary.  MyChart is used to connect with patients for Virtual Visits (Telemedicine).  Patients are able to view lab/test results, encounter notes, upcoming appointments, etc.  Non-urgent messages can be sent to your provider as well.   To learn more about what you can do with MyChart, go to [email or web address].    Your next appointment:   12 month(s)  The format for your next appointment:   In Person  Provider:   Ena Dawley, MD        Signed, Ena Dawley, MD  04/06/2020 10:10 AM    [place]

## 2020-04-13 DIAGNOSIS — M810 Age-related osteoporosis without current pathological fracture: Secondary | ICD-10-CM | POA: Diagnosis not present

## 2020-04-14 MED ORDER — AMLODIPINE BESYLATE 10 MG PO TABS
10.0000 mg | ORAL_TABLET | Freq: Every day | ORAL | 1 refills | Status: DC
Start: 2020-04-14 — End: 2020-04-20

## 2020-04-14 NOTE — Telephone Encounter (Signed)
Dorothy Spark, MD  You 9 minutes ago (10:05 AM)   Please increase amlodipine to 10 mg daily and send me new diary in 2 weeks. Thank you      Endorsed to the pt via mychart message that per Dr. Meda Coffee, she would like for her to increase her amlodipine to 10 mg po daily, and send [place] a new BP diary via mychart in 2 weeks.  Informed the pt that her med increase of amlodipine was sent to her pharmacy on file.  Advised her to message or call with any further questions or concerns regarding this.

## 2020-04-20 ENCOUNTER — Telehealth: Payer: Self-pay | Admitting: Cardiology

## 2020-04-20 MED ORDER — HYDRALAZINE HCL 25 MG PO TABS
25.0000 mg | ORAL_TABLET | Freq: Two times a day (BID) | ORAL | 1 refills | Status: DC
Start: 2020-04-20 — End: 2020-08-29

## 2020-04-20 MED ORDER — AMLODIPINE BESYLATE 5 MG PO TABS
5.0000 mg | ORAL_TABLET | Freq: Every day | ORAL | 1 refills | Status: DC
Start: 2020-04-20 — End: 2020-09-05

## 2020-04-20 NOTE — Telephone Encounter (Signed)
To: Prince Solian    From: Dorothy Spark, MD    Created: 04/20/2020 12:53 PM     Please add hydralazine 25 mg PO BID     Decreased her amlodipine back to the 5 mg po daily, for pt states she tolerates this dose better, and doesn't get LEE with this dose.    Spoke with the pt and informed her that per Dr. Meda Coffee, she wants to add hydralazine 25 mg po bid to her regimen.  Confirmed the pharmacy of choice with the pt.  Advised her to continue monitoring her BP and report any concerning readings, as needed.  Pt verbalized understanding and agrees with this plan. Pt was more than gracious for all the assistance provided.

## 2020-04-20 NOTE — Addendum Note (Signed)
Addended by: [name] on: 04/20/2020 08:01 AM   Modules accepted: Orders

## 2020-04-20 NOTE — Telephone Encounter (Signed)
Dorothy Spark, MD to [name]     12:53 PM Please add hydralazine 25 mg PO BID

## 2020-04-20 NOTE — Telephone Encounter (Signed)
New Message:   Pt says she needs another blood pressure medicine to go along with her 5 mg of Amlodipine. She said she does not know if one of the other blood pressure  medicine might need to be increased to take with the Amlodipine.

## 2020-04-20 NOTE — Telephone Encounter (Signed)
Spoke with the pt and informed her that per Dr. Meda Coffee, she wants to add hydralazine 25 mg po bid to her regimen.  Confirmed the pharmacy of choice with the pt.  Advised her to continue monitoring her BP and report any concerning readings, as needed.  Pt verbalized understanding and agrees with this plan. Pt was more than gracious for all the assistance provided.

## 2020-04-25 ENCOUNTER — Other Ambulatory Visit (HOSPITAL_COMMUNITY): Payer: PPO

## 2020-04-26 ENCOUNTER — Ambulatory Visit (HOSPITAL_COMMUNITY): Payer: PPO | Attending: Cardiovascular Disease

## 2020-04-26 ENCOUNTER — Other Ambulatory Visit: Payer: Self-pay

## 2020-04-26 DIAGNOSIS — E785 Hyperlipidemia, unspecified: Secondary | ICD-10-CM | POA: Insufficient documentation

## 2020-04-26 DIAGNOSIS — I10 Essential (primary) hypertension: Secondary | ICD-10-CM | POA: Insufficient documentation

## 2020-04-26 DIAGNOSIS — I480 Paroxysmal atrial fibrillation: Secondary | ICD-10-CM | POA: Diagnosis not present

## 2020-04-26 DIAGNOSIS — I341 Nonrheumatic mitral (valve) prolapse: Secondary | ICD-10-CM | POA: Insufficient documentation

## 2020-04-26 DIAGNOSIS — I340 Nonrheumatic mitral (valve) insufficiency: Secondary | ICD-10-CM | POA: Insufficient documentation

## 2020-04-26 LAB — ECHOCARDIOGRAM COMPLETE
Area-P 1/2: 2.29 cm2
S' Lateral: 2.4 cm

## 2020-04-29 DIAGNOSIS — Z85828 Personal history of other malignant neoplasm of skin: Secondary | ICD-10-CM | POA: Diagnosis not present

## 2020-04-29 DIAGNOSIS — L720 Epidermal cyst: Secondary | ICD-10-CM | POA: Diagnosis not present

## 2020-05-07 ENCOUNTER — Other Ambulatory Visit: Payer: Self-pay | Admitting: Cardiology

## 2020-05-11 DIAGNOSIS — M5416 Radiculopathy, lumbar region: Secondary | ICD-10-CM | POA: Diagnosis not present

## 2020-05-11 DIAGNOSIS — M4316 Spondylolisthesis, lumbar region: Secondary | ICD-10-CM | POA: Diagnosis not present

## 2020-05-11 DIAGNOSIS — M5441 Lumbago with sciatica, right side: Secondary | ICD-10-CM | POA: Diagnosis not present

## 2020-05-11 DIAGNOSIS — M5126 Other intervertebral disc displacement, lumbar region: Secondary | ICD-10-CM | POA: Diagnosis not present

## 2020-05-13 ENCOUNTER — Encounter: Payer: Self-pay | Admitting: Internal Medicine

## 2020-05-16 ENCOUNTER — Encounter: Payer: Self-pay | Admitting: Internal Medicine

## 2020-05-16 ENCOUNTER — Ambulatory Visit: Payer: PPO | Admitting: Internal Medicine

## 2020-05-16 ENCOUNTER — Ambulatory Visit (INDEPENDENT_AMBULATORY_CARE_PROVIDER_SITE_OTHER): Payer: PPO | Admitting: Internal Medicine

## 2020-05-16 ENCOUNTER — Other Ambulatory Visit: Payer: Self-pay

## 2020-05-16 VITALS — Ht 64.0 in | Wt 144.8 lb

## 2020-05-16 DIAGNOSIS — Z8639 Personal history of other endocrine, nutritional and metabolic disease: Secondary | ICD-10-CM | POA: Diagnosis not present

## 2020-05-16 DIAGNOSIS — E890 Postprocedural hypothyroidism: Secondary | ICD-10-CM | POA: Diagnosis not present

## 2020-05-16 DIAGNOSIS — Z23 Encounter for immunization: Secondary | ICD-10-CM | POA: Diagnosis not present

## 2020-05-16 NOTE — Progress Notes (Signed)
Patient ID: [name], female   DOB: 1941-07-18, 79 y.o.   MRN: [id number]  This visit occurred during the SARS-CoV-2 public health emergency.  Safety protocols were in place, including screening questions prior to the visit, additional usage of staff PPE, and extensive cleaning of exam room while observing appropriate contact time as indicated for disinfecting solutions.   HPI  [name] is a 79 y.o.-year-old female, returning for f/u for h/o Graves ds and now postablative hypothyroidism. Last visit 1 year ago.  Reviewed and addended history: Patient was admitted to the hospital 06/2013 with Takotsubo cardiomyopathy, and a low TSH was found incidentally. She had cardiac catheterization on 06/17/2013 (after blood for TSH was drawn). She was also found to have atrial fibrillation in the hospital, now resolved (was on Coreg in the past and continues now). The patient's EF also normalized before discharge.  She was scheduled to have a hospital followup appointment with her PCP. Dr. Regis Bill repeated a TSH level and added a free T4 and a free T3 and these returned abnormal, suggesting thyrotoxicosis. Of note, her pulse was in the 50s and she was not in atrial fibrillation at the time of the appointment. Patient was referred to endocrinology for for further management.  Thyroid Uptake and scan (12/28/2013): Markedly elevated 24 hr radio iodine uptake of 82% Normal thyroid scan. Findings consistent with Graves disease.  Pt was initially on MMI, then had RAI Tx 01/26/2014 >> developed post ablative hypothyroidism  At last visit, on 75 mcg levothyroxine daily, her TSH was suppressed.  We decreased the dose to 50 mcg daily and the TSH returned elevated.  We increased the dose back to 75 mcg daily and the TSH normalized.  Pt is currently on levothyroxine 75 mcg daily, taken: - in am (4:3 am) - fasting - at least 30 min from b'fast - no Fe, MVI - + Protonix added since last visit - at  night - + Calcium with lunch and dinner - + Biotin 5000 mcg daily - stopped 3 days ago - + B complex  - last dose today  Reviewed her TFTs: Lab Results  Component Value Date   TSH 1.47 11/10/2019   TSH 8.69 (H) 09/29/2019   TSH 3.88 06/29/2019   TSH 0.27 (L) 05/18/2019   TSH 0.49 11/20/2018   TSH 0.53 05/22/2018   TSH 1.08 11/18/2017   TSH 3.110 04/09/2017   TSH 1.38 02/15/2016   TSH 1.24 08/22/2015   FREET4 1.02 11/10/2019   FREET4 0.86 09/29/2019   FREET4 0.86 06/29/2019   FREET4 1.39 05/18/2019   FREET4 1.22 11/20/2018   FREET4 1.20 05/22/2018   FREET4 1.02 11/18/2017   FREET4 1.02 08/22/2015   FREET4 1.66 (H) 05/23/2015   FREET4 1.42 11/22/2014  fT3 on 06/26/2013: 6.4 (2.3 - 4.2)   TSI's are elevated: Lab Results  Component Value Date   TSI 472 (H) 05/18/2019    Pt denies: - feeling nodules in neck - hoarseness - dysphagia - choking - SOB with lying down  She also has a h/o HTN, HL, tricuspid regurgitation, mitral valve prolapse, PMR.  She had cataract sx. 06/2018.  She has dry eyes. She sees Dr. Ellie Lunch.  She is off carvedilol due to fatigue and bradycardia.  She has a h/o L4 burst compression fx >> Gabapentin.  She continues on Prolia.  She tolerates this well.  This is managed in [place].  ROS: Constitutional: no weight gain/no weight loss, no fatigue, no subjective hyperthermia, no  subjective hypothermia Eyes: no blurry vision, no xerophthalmia ENT: no sore throat, + see HPI Cardiovascular: no CP/no SOB/no palpitations/no leg swelling Respiratory: no cough/no SOB/no wheezing Gastrointestinal: no N/no V/no D/no C/no acid reflux Musculoskeletal: no muscle aches/no joint aches, + B foot pain Skin: no rashes, no hair loss Neurological: no tremors/no numbness/no tingling/no dizziness  I reviewed pt's medications, allergies, PMH, social hx, family hx, and changes were documented in the history of present illness. Otherwise, unchanged from my initial  visit note.  Past Medical History:  Diagnosis Date  . Abnormal EKG   . Atrial fibrillation ([place])   . Basal cell carcinoma of nose    removed w/MOHs  . CAD (coronary artery disease)    a. NSTEMI 06/2013 => LHC:  mLAD 40, oD2 70 (small), pOM 20, mRCA 20, mid to dist ant HK, EF 30-35% (c/w Tako-Tsubo CM)  . Cataract   . Chronic cystitis   . CIN I (cervical intraepithelial neoplasia I)   . Dyslipidemia   . Ejection fraction    EF 65%, echo, 2011  . Heart murmur    MVP   . Hemorrhoids   . History of colon polyps   . HTN (hypertension)   . Hx of colonoscopy   . Hyperlipidemia   . Hyponatremia   . 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 IBS (irritable bowel syndrome)   . Lumbar vertebral fracture ([facility])   . Mitral valve prolapse    echo, January, 2011, moderate prolapse with your leaflet, trivial MR   Antibiotic required for procedures  . MVP (mitral valve prolapse)   . Orthostasis    Mild orthostatic change when she stands  . Osteoarthritis    "do not have RA" (06/17/2013)  . Osteoporosis 01/2014, 03/2019   01/2014 T score -2.3 followed by Dr. Marcelino Scot, 03/2019 T score -2.3  . Polymyalgia rheumatica ([place])   . PONV (postoperative nausea and vomiting)   . Potassium (K) deficiency    5 potassium requirement over time  . 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 Syncope    August, 200 weight, dehydration  . 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 EF 30-35% at [facility] 06/2013;  b.  f/u Echo (06/18/13):  EF 60-65%, normal wall motion, Gr 1 DD, mild MVP of post leaflet, mod TR, PASP 63  . Thyroid disease   . Tricuspid regurgitation    mild to moderate, echo, January, 2011, PA pressure 38 mm mercury   Past Surgical History:  Procedure Laterality Date  . CARDIAC CATHETERIZATION  06/17/2013  . COLONOSCOPY  12/23/2003   normal (indications: prior adenomas and grandfather with colon cancer)  . COLPOSCOPY    . EXCISIONAL  HEMORRHOIDECTOMY  2003  . LEFT HEART CATHETERIZATION WITH CORONARY ANGIOGRAM N/A 06/17/2013   Procedure: LEFT HEART CATHETERIZATION WITH CORONARY ANGIOGRAM;  Surgeon: Peter M Martinique, MD;  Location: [facility] CATH LAB;  Service: Cardiovascular;  Laterality: N/A;  . MOHS SURGERY Left 2011   "side of my nose" (06/17/2013)  . TONSILLECTOMY    . TOTAL KNEE ARTHROPLASTY  08/2010  . TOTAL KNEE ARTHROPLASTY  02/25/2012   Procedure: TOTAL KNEE ARTHROPLASTY;  Surgeon: Gearlean Alf, MD;  Location: [facility] ORS;  Service: Orthopedics;  Laterality: Right;   Social History   Socioeconomic History  . Marital status: Widowed    Spouse name: Not on file  . Number of children: 1  . Years of education:  Not on file  . Highest education level: Not on file  Occupational History  . Occupation: Homemaker  Tobacco Use  . Smoking status: Never Smoker  . Smokeless tobacco: Never Used  Vaping Use  . Vaping Use: Never used  Substance and Sexual Activity  . Alcohol use: Yes    Alcohol/week: 0.0 standard drinks    Comment: Rare  . Drug use: No  . Sexual activity: Never    Birth control/protection: Post-menopausal    Comment: 1st intercourse 79 yo-Fewer than 5 partners  Other Topics Concern  . Not on file  Social History Narrative   3 caffeine drinks daily    Married   Retired         Scientist, physiological Strain:   . Difficulty of Paying Living Expenses: Not on file  Food Insecurity:   . Worried About Charity fundraiser in the Last Year: Not on file  . Ran Out of Food in the Last Year: Not on file  Transportation Needs:   . Lack of Transportation (Medical): Not on file  . Lack of Transportation (Non-Medical): Not on file  Physical Activity:   . Days of Exercise per Week: Not on file  . Minutes of Exercise per Session: Not on file  Stress:   . Feeling of Stress : Not on file  Social Connections:   . Frequency of Communication with Friends and Family: Not on file  .  Frequency of Social Gatherings with Friends and Family: Not on file  . Attends Religious Services: Not on file  . Active Member of Clubs or Organizations: Not on file  . Attends Archivist Meetings: Not on file  . Marital Status: Not on file  Intimate Partner Violence:   . Fear of Current or Ex-Partner: Not on file  . Emotionally Abused: Not on file  . Physically Abused: Not on file  . Sexually Abused: Not on file   Current Outpatient Medications on File Prior to Visit  Medication Sig Dispense Refill  . amLODipine (NORVASC) 5 MG tablet Take 1 tablet (5 mg total) by mouth daily. 90 tablet 1  . atorvastatin (LIPITOR) 10 MG tablet Take 1 tablet by mouth every day with breakfast. 90 tablet 1  . Biotin 5000 MCG CAPS Take 1 capsule by mouth daily.     . Calcium Carbonate (CALTRATE 600 PO) Take 1 tablet by mouth 2 (two) times a day.     . Cholecalciferol (D3-1000) 1000 UNITS capsule Take 1,000 Units by mouth daily.    Marland Kitchen denosumab (PROLIA) 60 MG/ML SOSY injection Inject 60 mg into the skin every 6 (six) months.    . diclofenac sodium (VOLTAREN) 1 % GEL 1 application.  2  . enalapril (VASOTEC) 20 MG tablet Take 1 tablet (20 mg total) by mouth 2 (two) times daily. 180 tablet 2  . EVENING PRIMROSE OIL PO Take 1 capsule by mouth 2 (two) times a day.     . gabapentin (NEURONTIN) 100 MG capsule Take 500 mg by mouth daily.   3  . hydrALAZINE (APRESOLINE) 25 MG tablet Take 1 tablet (25 mg total) by mouth 2 (two) times daily. 180 tablet 1  . hydrochlorothiazide (HYDRODIURIL) 25 MG tablet Take 1 tablet (25 mg total) by mouth daily. 90 tablet 1  . levothyroxine (SYNTHROID) 75 MCG tablet Take 1 tablet (75 mcg total) by mouth daily. 45 tablet 3  . MELATONIN PO Take by mouth as needed.    Marland Kitchen  meloxicam (MOBIC) 15 MG tablet Take 15 mg by mouth daily.    . methylcellulose packet Take 1 each by mouth daily. CITRICEL    . metroNIDAZOLE (METROCREAM) 0.75 % cream Apply 1 application topically 2 (two) times  daily.    . pantoprazole (PROTONIX) 40 MG tablet Take 1 tablet by mouth daily.    Marland Kitchen tretinoin (RETIN-A) 0.05 % cream Apply 1 application topically at bedtime.    Marland Kitchen zolpidem (AMBIEN) 5 MG tablet Take 5 mg by mouth at bedtime as needed for sleep.     No current facility-administered medications on file prior to visit.   No Known Allergies Family History  Problem Relation Age of Onset  . Heart attack Mother 40  . Hypertension Mother   . Heart disease Mother   . Stroke Mother   . Heart attack Father 51  . Coronary artery disease Father        Multiple Family Members  . Hypertension Father   . Heart disease Father   . Esophageal cancer Paternal Grandmother   . Colon cancer Maternal Grandfather   . Heart disease Maternal Grandfather   . Irritable bowel syndrome Sister        More family members on father side of   . Hypertension Sister   . Heart disease Sister   . Breast cancer Sister 76  . Breast cancer Paternal Aunt        Age 74's  . Heart disease Paternal Grandfather   . Rectal cancer Neg Hx   . Stomach cancer Neg Hx     PE: Ht 5\' 4"  (1.626 m)   Wt 144 lb 12.8 oz (65.7 kg)   BMI 24.85 kg/m  Body mass index is 24.85 kg/m. Wt Readings from Last 3 Encounters:  05/16/20 144 lb 12.8 oz (65.7 kg)  04/06/20 144 lb (65.3 kg)  11/24/19 148 lb (67.1 kg)   Constitutional: normal weight, in NAD Eyes: PERRLA, EOMI, no exophthalmos ENT: moist mucous membranes, no thyromegaly, no cervical lymphadenopathy Cardiovascular: RRR, No MRG Respiratory: CTA B Gastrointestinal: abdomen soft, NT, ND, BS+ Musculoskeletal: no deformities, strength intact in all 4 Skin: moist, warm, no rashes Neurological: no tremor with outstretched hands, DTR not elicited in LE extremities due to bilateral TKR  ASSESSMENT: 1. H/o Graves ds  2. Postablative hypothyroidism  4. S/p L4 fracture - She fell backwards >> L4 compression fracture.  - She was on Reclast >> this year was her fourth drug holiday  year. - Managed by Dr. Marcelino Scot in [place] - Discussed pros and cons of Prolia >> started it and continues on this  PLAN:  1. Patient with history of Graves' disease, status post RAI treatment, now with post ablative hypothyroidism -She has dry eyes, which is a chronic condition for her, but no signs of Graves' ophthalmopathy: No exophthalmos, chemosis, double vision, eye pain -She is off beta-blocker (Coreg) per cardiology. -At last visit, we checked her Graves' antibodies and they were still elevated.  2. Postablative hypothyroidism -At last visit we decreased the dose of LT4 to 50 mcg daily due to suppressed TSH.  However, subsequent TSH was elevated so we increased the dose back to 75 mcg daily.  Tests normalized on this dose. - latest thyroid labs reviewed with pt >> normal: Lab Results  Component Value Date   TSH 1.47 11/10/2019   - she continues on LT4 75 mcg daily - pt feels good on this dose. - we discussed about taking the thyroid hormone every day,  with water, >30 minutes before breakfast, separated by >4 hours from acid reflux medications, calcium, iron, multivitamins. Pt. is taking it correctly. - will check thyroid tests in few days as she is still on Biotin now: TSH and fT4 - If labs are abnormal, she will need to return for repeat TFTs in 1.5 months  Orders Placed This Encounter  Procedures  . TSH  . T4, free   + flu shot today.  Component     Latest Ref Rng & Units 05/23/2020  T4,Free(Direct)     0.60 - 1.60 ng/dL 0.98  TSH     0.35 - 4.50 uIU/mL 0.79  Normal TFTs.  Philemon Kingdom, MD PhD [facility]

## 2020-05-16 NOTE — Patient Instructions (Signed)
Please continue Levothyroxine 75 mcg daily.  Take the thyroid hormone every day, with water, at least 30 minutes before breakfast, separated by at least 4 hours from: - acid reflux medications - calcium - iron - multivitamins  Remember to stop biotin: - for the 5000 mcg dose: stop for 1 week before labs - for the 1000 mcg dose: stop for 2 days before labs  Please come back for labs at the end of the week.

## 2020-05-23 ENCOUNTER — Other Ambulatory Visit: Payer: Self-pay

## 2020-05-23 ENCOUNTER — Other Ambulatory Visit (INDEPENDENT_AMBULATORY_CARE_PROVIDER_SITE_OTHER): Payer: PPO

## 2020-05-23 DIAGNOSIS — E890 Postprocedural hypothyroidism: Secondary | ICD-10-CM | POA: Diagnosis not present

## 2020-05-23 LAB — T4, FREE: Free T4: 0.98 ng/dL (ref 0.60–1.60)

## 2020-05-23 LAB — TSH: TSH: 0.79 u[IU]/mL (ref 0.35–4.50)

## 2020-05-24 ENCOUNTER — Encounter: Payer: Self-pay | Admitting: Internal Medicine

## 2020-05-24 NOTE — Telephone Encounter (Signed)
Please advise. Patient just completed lab on 05/23/2020.

## 2020-06-01 DIAGNOSIS — G629 Polyneuropathy, unspecified: Secondary | ICD-10-CM | POA: Diagnosis not present

## 2020-06-15 ENCOUNTER — Other Ambulatory Visit: Payer: Self-pay | Admitting: Internal Medicine

## 2020-07-05 DIAGNOSIS — Z791 Long term (current) use of non-steroidal anti-inflammatories (NSAID): Secondary | ICD-10-CM | POA: Diagnosis not present

## 2020-07-05 DIAGNOSIS — M79672 Pain in left foot: Secondary | ICD-10-CM | POA: Diagnosis not present

## 2020-07-05 DIAGNOSIS — E039 Hypothyroidism, unspecified: Secondary | ICD-10-CM | POA: Diagnosis not present

## 2020-07-05 DIAGNOSIS — M810 Age-related osteoporosis without current pathological fracture: Secondary | ICD-10-CM | POA: Diagnosis not present

## 2020-07-05 DIAGNOSIS — I10 Essential (primary) hypertension: Secondary | ICD-10-CM | POA: Diagnosis not present

## 2020-07-13 DIAGNOSIS — E890 Postprocedural hypothyroidism: Secondary | ICD-10-CM | POA: Diagnosis not present

## 2020-07-13 DIAGNOSIS — I2510 Atherosclerotic heart disease of native coronary artery without angina pectoris: Secondary | ICD-10-CM | POA: Diagnosis not present

## 2020-07-13 DIAGNOSIS — M353 Polymyalgia rheumatica: Secondary | ICD-10-CM | POA: Diagnosis not present

## 2020-07-13 DIAGNOSIS — I10 Essential (primary) hypertension: Secondary | ICD-10-CM | POA: Diagnosis not present

## 2020-07-13 DIAGNOSIS — R0982 Postnasal drip: Secondary | ICD-10-CM | POA: Diagnosis not present

## 2020-07-13 DIAGNOSIS — M810 Age-related osteoporosis without current pathological fracture: Secondary | ICD-10-CM | POA: Diagnosis not present

## 2020-07-13 DIAGNOSIS — Z0000 Encounter for general adult medical examination without abnormal findings: Secondary | ICD-10-CM | POA: Diagnosis not present

## 2020-07-13 DIAGNOSIS — N1831 Chronic kidney disease, stage 3a: Secondary | ICD-10-CM | POA: Diagnosis not present

## 2020-07-13 DIAGNOSIS — Z8781 Personal history of (healed) traumatic fracture: Secondary | ICD-10-CM | POA: Diagnosis not present

## 2020-07-13 DIAGNOSIS — G4762 Sleep related leg cramps: Secondary | ICD-10-CM | POA: Diagnosis not present

## 2020-07-13 DIAGNOSIS — F5104 Psychophysiologic insomnia: Secondary | ICD-10-CM | POA: Diagnosis not present

## 2020-07-25 DIAGNOSIS — M25572 Pain in left ankle and joints of left foot: Secondary | ICD-10-CM | POA: Diagnosis not present

## 2020-07-25 DIAGNOSIS — M19072 Primary osteoarthritis, left ankle and foot: Secondary | ICD-10-CM | POA: Diagnosis not present

## 2020-07-25 DIAGNOSIS — M722 Plantar fascial fibromatosis: Secondary | ICD-10-CM | POA: Diagnosis not present

## 2020-07-25 DIAGNOSIS — M79672 Pain in left foot: Secondary | ICD-10-CM | POA: Diagnosis not present

## 2020-08-16 DIAGNOSIS — L821 Other seborrheic keratosis: Secondary | ICD-10-CM | POA: Diagnosis not present

## 2020-08-16 DIAGNOSIS — D2362 Other benign neoplasm of skin of left upper limb, including shoulder: Secondary | ICD-10-CM | POA: Diagnosis not present

## 2020-08-16 DIAGNOSIS — D2271 Melanocytic nevi of right lower limb, including hip: Secondary | ICD-10-CM | POA: Diagnosis not present

## 2020-08-16 DIAGNOSIS — Z85828 Personal history of other malignant neoplasm of skin: Secondary | ICD-10-CM | POA: Diagnosis not present

## 2020-08-16 DIAGNOSIS — D2262 Melanocytic nevi of left upper limb, including shoulder: Secondary | ICD-10-CM | POA: Diagnosis not present

## 2020-08-16 DIAGNOSIS — D1801 Hemangioma of skin and subcutaneous tissue: Secondary | ICD-10-CM | POA: Diagnosis not present

## 2020-08-16 DIAGNOSIS — L738 Other specified follicular disorders: Secondary | ICD-10-CM | POA: Diagnosis not present

## 2020-08-23 ENCOUNTER — Ambulatory Visit: Payer: PPO | Admitting: Neurology

## 2020-08-25 ENCOUNTER — Encounter: Payer: Self-pay | Admitting: *Deleted

## 2020-08-26 ENCOUNTER — Other Ambulatory Visit: Payer: Self-pay

## 2020-08-26 ENCOUNTER — Ambulatory Visit: Payer: Medicare Other | Admitting: Neurology

## 2020-08-26 ENCOUNTER — Encounter: Payer: Self-pay | Admitting: Neurology

## 2020-08-26 VITALS — BP 161/75 | HR 69 | Ht 64.0 in | Wt 149.0 lb

## 2020-08-26 DIAGNOSIS — G6289 Other specified polyneuropathies: Secondary | ICD-10-CM

## 2020-08-26 DIAGNOSIS — E539 Vitamin B deficiency, unspecified: Secondary | ICD-10-CM | POA: Diagnosis not present

## 2020-08-26 DIAGNOSIS — Z131 Encounter for screening for diabetes mellitus: Secondary | ICD-10-CM | POA: Diagnosis not present

## 2020-08-26 DIAGNOSIS — E519 Thiamine deficiency, unspecified: Secondary | ICD-10-CM | POA: Diagnosis not present

## 2020-08-26 DIAGNOSIS — E538 Deficiency of other specified B group vitamins: Secondary | ICD-10-CM | POA: Diagnosis not present

## 2020-08-26 DIAGNOSIS — E531 Pyridoxine deficiency: Secondary | ICD-10-CM | POA: Diagnosis not present

## 2020-08-26 MED ORDER — GABAPENTIN 300 MG PO CAPS: 900.0000 mg | ORAL_CAPSULE | Freq: Three times a day (TID) | ORAL | 11 refills | Status: DC

## 2020-08-26 NOTE — Patient Instructions (Addendum)
- Alpha-lipoic acid 600mg  over the counter  -May consider daily alpha lipoic acid which is an antioxidant that may reduce free radical oxidative stress associated with diabetic and other polyneuropathy, existing evidence suggests that alpha lipoic acid significantly reduces stabbing, lancinating and burning pain and neuropathy with its onset of action as early as 1-2 weeks. - compounding cream - continue Gabapentin or consider Lyrica - Podiatrist - Botox - will call    Leory Plowman and Daroff's neurology in clinical practice (8th ed., pp. 5277- 1929). Elsevier."> Goldman-Cecil medicine (26th ed., pp. 2489- 2501). Elsevier.">  Peripheral Neuropathy Peripheral neuropathy is a type of nerve damage. It affects nerves that carry signals between the spinal cord and the arms, legs, and the rest of the body (peripheral nerves). It does not affect nerves in the spinal cord or brain. In peripheral neuropathy, one nerve or a group of nerves may be damaged. Peripheral neuropathy is a broad category that includes many specific nerve disorders, like diabetic neuropathy, hereditary neuropathy, and carpal tunnel syndrome. What are the causes? This condition may be caused by:  Diabetes. This is the most common cause of peripheral neuropathy.  Nerve injury.  Pressure or stress on a nerve that lasts a long time.  Lack (deficiency) of B vitamins. This can result from alcoholism, poor diet, or a restricted diet.  Infections.  Autoimmune diseases, such as rheumatoid arthritis and systemic lupus erythematosus.  Nerve diseases that are passed from parent to child (inherited).  Some medicines, such as cancer medicines (chemotherapy).  Poisonous (toxic) substances, such as lead and mercury.  Too little blood flowing to the legs.  Kidney disease.  Thyroid disease. In some cases, the cause of this condition is not known. What are the signs or symptoms? Symptoms of this condition depend on which of your nerves  is damaged. Common symptoms include:  Loss of feeling (numbness) in the feet, hands, or both.  Tingling in the feet, hands, or both.  Burning pain.  Very sensitive skin.  Weakness.  Not being able to move a part of the body (paralysis).  Muscle twitching.  Clumsiness or poor coordination.  Loss of balance.  Not being able to control your bladder.  Feeling dizzy.  Sexual problems. How is this diagnosed? Diagnosing and finding the cause of peripheral neuropathy can be difficult. Your health care provider will take your medical history and do a physical exam. A neurological exam will also be done. This involves checking things that are affected by your brain, spinal cord, and nerves (nervous system). For example, your health care provider will check your reflexes, how you move, and what you can feel. You may have other tests, such as:  Blood tests.  Electromyogram (EMG) and nerve conduction tests. These tests check nerve function and how well the nerves are controlling the muscles.  Imaging tests, such as CT scans or MRI to rule out other causes of your symptoms.  Removing a small piece of nerve to be examined in a lab (nerve biopsy).  Removing and examining a small amount of the fluid that surrounds the brain and spinal cord (lumbar puncture). How is this treated? Treatment for this condition may involve:  Treating the underlying cause of the neuropathy, such as diabetes, kidney disease, or vitamin deficiencies.  Stopping medicines that can cause neuropathy, such as chemotherapy.  Medicine to help relieve pain. Medicines may include: ? Prescription or over-the-counter pain medicine. ? Antiseizure medicine. ? Antidepressants. ? Pain-relieving patches that are applied to painful areas of  skin.  Surgery to relieve pressure on a nerve or to destroy a nerve that is causing pain.  Physical therapy to help improve movement and balance.  Devices to help you move around  (assistive devices). Follow these instructions at home: Medicines  Take over-the-counter and prescription medicines only as told by your health care provider. Do not take any other medicines without first asking your health care provider.  Do not drive or use heavy machinery while taking prescription pain medicine. Lifestyle  Do not use any products that contain nicotine or tobacco, such as cigarettes and e-cigarettes. Smoking keeps blood from reaching damaged nerves. If you need help quitting, ask your health care provider.  Avoid or limit alcohol. Too much alcohol can cause a vitamin B deficiency, and vitamin B is needed for healthy nerves.  Eat a healthy diet. This includes: ? Eating foods that are high in fiber, such as fresh fruits and vegetables, whole grains, and beans. ? Limiting foods that are high in fat and processed sugars, such as fried or sweet foods.   General instructions  If you have diabetes, work closely with your health care provider to keep your blood sugar under control.  If you have numbness in your feet: ? Check every day for signs of injury or infection. Watch for redness, warmth, and swelling. ? Wear padded socks and comfortable shoes. These help protect your feet.  Develop a good support system. Living with peripheral neuropathy can be stressful. Consider talking with a mental health specialist or joining a support group.  Use assistive devices and attend physical therapy as told by your health care provider. This may include using a walker or a cane.  Keep all follow-up visits as told by your health care provider. This is important.   Contact a health care provider if:  You have new signs or symptoms of peripheral neuropathy.  You are struggling emotionally from dealing with peripheral neuropathy.  Your pain is not well-controlled. Get help right away if:  You have an injury or infection that is not healing normally.  You develop new weakness in an  arm or leg.  You have fallen or do so frequently. Summary  Peripheral neuropathy is when the nerves in the arms, or legs are damaged, resulting in numbness, weakness, or pain.  There are many causes of peripheral neuropathy, including diabetes, pinched nerves, vitamin deficiencies, autoimmune disease, and hereditary conditions.  Diagnosing and finding the cause of peripheral neuropathy can be difficult. Your health care provider will take your medical history, do a physical exam, and do tests, including blood tests and nerve function tests.  Treatment involves treating the underlying cause of the neuropathy and taking medicines to help control pain. Physical therapy and assistive devices may also help. This information is not intended to replace advice given to you by your health care provider. Make sure you discuss any questions you have with your health care provider. Document Revised: 05/03/2020 Document Reviewed: 05/03/2020 Elsevier Patient Education  2021 [employer].

## 2020-08-26 NOTE — Progress Notes (Signed)
[facility]    Provider:  Dr Jaynee Eagles Requesting Provider: Erline Levine, MD Primary Care Provider:  Deland Pretty, MD  CC:  Neuropathy  HPI:  [name] is a 80 y.o. female here as requested by Erline Levine, MD for peripheral polyneuropathy.  She has a past medical history of thyroid disease (Graves' disease), syncope, chronic subdural hematoma, polymyalgia rheumatica, long-term NSAID use and ringing in the ears, history of dizziness, sinus bradycardia, pulmonary hypertension, tricuspid regurgitation, osteoarthritis, mitral valve prolapse, lumbar vertebral fracture, IBS, hyponatremia/potassium deficiency, hyperlipidemia, hypertension, heart murmur, A. fib.  I reviewed Dr. Melven Sartorius notes: Patient has pins-and-needles in both of her feet to the balls of her feet and numbness in all of her toes in both feet, she feels the right side is more affected than the left side, she denies any upper extremity numbness, on discussion it appeared to be most consistent with a peripheral polyneuropathy, no buttocks pain or significant sciatica, he referred her to neurology for evaluation for peripheral neuropathy, she is on Neurontin 100 mg 3 times daily, she does have back pain but as above no radicular symptoms pain 6 out of 10, onset was in September 2019 duration varies and patient was instructed to continue medication management.  Started in the right foot and then the left one in the balls of the feet. Now burning in the instep. In the balls of the feet, its red and sore and hurts. (Also hurts on the left insole which is more structural, feels good to rub, likely structural). Started 1.5 years ago. Slowly worsening. Continuous, no weakness, no gait abnormality, No other focal neurologic deficits, associated symptoms, inciting events or modifiable factors.  Reviewed notes, labs and imaging from outside physicians, which showed: see above  Review of Systems: Patient complains of  symptoms per HPI as well as the following symptoms: foot pain. Pertinent negatives and positives per HPI. All others negative.   Social History   Socioeconomic History  . Marital status: Widowed    Spouse name: Not on file  . Number of children: 1  . Years of education: Not on file  . Highest education level: Not on file  Occupational History  . Occupation: Homemaker  Tobacco Use  . Smoking status: Never Smoker  . Smokeless tobacco: Never Used  Vaping Use  . Vaping Use: Never used  Substance and Sexual Activity  . Alcohol use: Yes    Alcohol/week: 0.0 standard drinks    Comment: Rare  . Drug use: No  . Sexual activity: Never    Birth control/protection: Post-menopausal    Comment: 1st intercourse 80 yo-Fewer than 5 partners  Other Topics Concern  . Not on file  Social History Narrative   3 caffeine drinks daily    Married   Retired         Investment banker, operational of Radio broadcast assistant Strain: Not on file  Food Insecurity: Not on file  Transportation Needs: Not on file  Physical Activity: Not on file  Stress: Not on file  Social Connections: Not on file  Intimate Partner Violence: Not on file    Family History  Problem Relation Age of Onset  . Heart attack Mother 52  . Hypertension Mother   . Heart disease Mother   . Stroke Mother   . Heart attack Father 55  . Coronary artery disease Father        Multiple Family Members  . Hypertension Father   . Heart disease Father   .  Esophageal cancer Paternal Grandmother   . Colon cancer Maternal Grandfather   . Heart disease Maternal Grandfather   . Irritable bowel syndrome Sister        More family members on father side of   . Hypertension Sister   . Heart disease Sister   . Breast cancer Sister 76  . Breast cancer Paternal Aunt        Age 57's  . Heart disease Paternal Grandfather   . Rectal cancer Neg Hx   . Stomach cancer Neg Hx     Past Medical History:  Diagnosis Date  . Abnormal EKG   .  Atrial fibrillation ([place])   . Basal cell carcinoma of nose    removed w/MOHs  . CAD (coronary artery disease)    a. NSTEMI 06/2013 => LHC:  mLAD 40, oD2 70 (small), pOM 20, mRCA 20, mid to dist ant HK, EF 30-35% (c/w Tako-Tsubo CM)  . Cataract   . Chronic cystitis   . CIN I (cervical intraepithelial neoplasia I)   . Dyslipidemia   . Ejection fraction    EF 65%, echo, 2011  . Heart murmur    MVP   . Hemorrhoids   . History of colon polyps   . HTN (hypertension)   . Hx of colonoscopy   . Hyperlipidemia   . Hyponatremia   . 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 IBS (irritable bowel syndrome)   . Lumbar vertebral fracture (H[facility])   . Mitral valve prolapse    echo, January, 2011, moderate prolapse with your leaflet, trivial MR   Antibiotic required for procedures  . MVP (mitral valve prolapse)   . Orthostasis    Mild orthostatic change when she stands  . Osteoarthritis    "do not have RA" (06/17/2013)  . Osteoporosis 01/2014, 03/2019   01/2014 T score -2.3 followed by Dr. Marcelino Scot, 03/2019 T score -2.3  . Polymyalgia rheumatica ([place])   . PONV (postoperative nausea and vomiting)   . Potassium (K) deficiency    5 potassium requirement over time  . 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 Syncope    August, 200 weight, dehydration  . 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 EF 30-35% at [facility] 06/2013;  b.  f/u Echo (06/18/13):  EF 60-65%, normal wall motion, Gr 1 DD, mild MVP of post leaflet, mod TR, PASP 63  . Thyroid disease   . Tricuspid regurgitation    mild to moderate, echo, January, 2011, PA pressure 38 mm mercury    Patient Active Problem List   Diagnosis Date Noted  . Peripheral neuropathy 08/28/2020  . Pulmonary HTN ([place]) 07/25/2015  . 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 Sinus bradycardia 08/17/2013  . Anticoagulation adequate 07/20/2013  . H/O Graves' disease 06/26/2013  . Abnormal EKG   .  Takotsubo syndrome 06/19/2013  . Atrial fibrillation ([place]) 06/19/2013  . Orthostasis   . H/O dizziness 12/26/2012  . Abnormal LFTs 12/26/2012  . NSAID long-term use 12/05/2012  . Acute epigastric pain 12/05/2012  . CIN I (cervical intraepithelial neoplasia I)   . Ringing in ears 05/09/2011  . Dyslipidemia   . HTN (hypertension)   . Mitral valve prolapse   . Tricuspid regurgitation   . Subdural hematoma ([place])   . Ejection fraction   . Potassium (K) deficiency   . Syncope   . Personal history of colonic polyps 02/06/2011  . Personal history  of failed moderate sedation 02/06/2011  . RHINITIS 08/23/2010  . ARTHRITIS, KNEE 08/23/2010  . SKIN CANCER, HX OF 02/24/2010  . FX CLSD SKL VLT W/HEM NEC, CONCUSSION NOS 04/07/2007  . POLYMYALGIA RHEUMATICA 04/02/2007  . Osteoporosis 03/11/2007    Past Surgical History:  Procedure Laterality Date  . CARDIAC CATHETERIZATION  06/17/2013  . COLONOSCOPY  12/23/2003   normal (indications: prior adenomas and grandfather with colon cancer)  . COLPOSCOPY    . EXCISIONAL HEMORRHOIDECTOMY  2003  . LEFT HEART CATHETERIZATION WITH CORONARY ANGIOGRAM N/A 06/17/2013   Procedure: LEFT HEART CATHETERIZATION WITH CORONARY ANGIOGRAM;  Surgeon: Peter M Martinique, MD;  Location: [facility] CATH LAB;  Service: Cardiovascular;  Laterality: N/A;  . MOHS SURGERY Left 2011   "side of my nose" (06/17/2013)  . TONSILLECTOMY    . TOTAL KNEE ARTHROPLASTY  08/2010  . TOTAL KNEE ARTHROPLASTY  02/25/2012   Procedure: TOTAL KNEE ARTHROPLASTY;  Surgeon: Gearlean Alf, MD;  Location: [facility] ORS;  Service: Orthopedics;  Laterality: Right;    Current Outpatient Medications  Medication Sig Dispense Refill  . amLODipine (NORVASC) 5 MG tablet Take 1 tablet (5 mg total) by mouth daily. 90 tablet 1  . atorvastatin (LIPITOR) 10 MG tablet Take 1 tablet by mouth every day with breakfast. 90 tablet 1  . Biotin 5000 MCG CAPS Take 1 capsule by mouth daily.     . Calcium Carbonate (CALTRATE 600  PO) Take 1 tablet by mouth 2 (two) times a day.     . Cholecalciferol 25 MCG (1000 UT) capsule Take 1,000 Units by mouth daily.    Marland Kitchen denosumab (PROLIA) 60 MG/ML SOSY injection Inject 60 mg into the skin every 6 (six) months.    . diclofenac sodium (VOLTAREN) 1 % GEL 1 application.  2  . enalapril (VASOTEC) 20 MG tablet Take 1 tablet (20 mg total) by mouth 2 (two) times daily. 180 tablet 2  . EVENING PRIMROSE OIL PO Take 1 capsule by mouth 2 (two) times a day.     . gabapentin (NEURONTIN) 300 MG capsule Take 3 capsules (900 mg total) by mouth 3 (three) times daily. 270 capsule 11  . hydrALAZINE (APRESOLINE) 25 MG tablet Take 1 tablet (25 mg total) by mouth 2 (two) times daily. 180 tablet 1  . hydrochlorothiazide (HYDRODIURIL) 25 MG tablet Take 1 tablet (25 mg total) by mouth daily. 90 tablet 1  . levothyroxine (SYNTHROID) 75 MCG tablet TAKE 1 TABLET BY MOUTH EVERY DAY 90 tablet 1  . MELATONIN PO Take by mouth as needed.    . meloxicam (MOBIC) 15 MG tablet Take 15 mg by mouth daily.    . methylcellulose packet Take 1 each by mouth daily. CITRICEL    . metroNIDAZOLE (METROCREAM) 0.75 % cream Apply 1 application topically 2 (two) times daily.    . pantoprazole (PROTONIX) 40 MG tablet Take 1 tablet by mouth daily.    Marland Kitchen tretinoin (RETIN-A) 0.05 % cream Apply 1 application topically at bedtime.    Marland Kitchen zolpidem (AMBIEN) 5 MG tablet Take 5 mg by mouth at bedtime as needed for sleep.     No current facility-administered medications for this visit.    Allergies as of 08/26/2020  . (No Known Allergies)    Vitals: BP (!) 161/75   Pulse 69   Ht $R'5\' 4"'uZ$  (1.626 m)   Wt 149 lb (67.6 kg)   BMI 25.58 kg/m  Last Weight:  Wt Readings from Last 1 Encounters:  08/26/20 149 lb (67.6  kg)   Last Height:   Ht Readings from Last 1 Encounters:  08/26/20 $RemoveB'5\' 4"'XwDZpRmO$  (1.626 m)     Physical exam: Exam: Gen: NAD, conversant, well nourised, well groomed                     CV: RRR, no MRG. No Carotid Bruits. No  peripheral edema, warm, nontender Eyes: Conjunctivae clear without exudates or hemorrhage  Neuro: Detailed Neurologic Exam  Speech:    Speech is normal; fluent and spontaneous with normal comprehension.  Cognition:    The patient is oriented to person, place, and time;     recent and remote memory intact;     language fluent;     normal attention, concentration,     fund of knowledge Cranial Nerves:    The pupils are equal, round, and reactive to light. The pupils are too small to visualize fundi. Visual fields are full to finger confrontation. Extraocular movements are intact. Trigeminal sensation is intact and the muscles of mastication are normal. The face is symmetric. The palate elevates in the midline. Hearing intact. Voice is normal. Shoulder shrug is normal. The tongue has normal motion without fasciculations.   Coordination:    No dysmetria or ataxia  Gait:    Normal native gait  Motor Observation:    No asymmetry, no atrophy, and no involuntary movements noted. Tone:    Normal muscle tone.    Posture:    Posture is normal. normal erect    Strength:    Strength is V/V in the upper and lower limbs.      Sensation: intact to LT dec pin prick and temp to mid foot, impaired vibration distally      Reflex Exam:  DTR's: trace AJs.   Trace Ajs.  Deep tendon reflexes in the upper and lower extremities are symmetrical bilaterally.   Toes:    The toes are downgoing bilaterally.   Clonus:    Clonus is absent.    Assessment/Plan:  Discussed the causes of peripheral neuropathy, the most common being diabetes which patient does not report having. About 20 million people in the [place] states have some form of peripheral neuropathy. This is a condition that develops as a result of damage to the peripheral nervous system. Given symptoms which are distal predominant, symmetrical, slowly progressive, and an ascending pattern with decreased sensation in mostly pin prick and temp,  suspect a predominantly small-fiber There are multiple causes eluding metabolic, toxic, nutritional,immune mediated, vascular,genetic, infectious and endocrine disorders, small vessel disease, autoimmune diseases, and others. .  - Will order an extensive panel of blood tests as per above  -May consider daily alpha lipoic acid which is an antioxidant that may reduce free radical oxidative stress associated with diabetic and other polyneuropathies, existing evidence suggests that alpha lipoic acid significantly reduces stabbing, lancinating and burning pain and neuropathy with its onset of action as early as 1-2 weeks. - Topical cream - topical with gabapentin, amitriptyline and others to compound - continue Gabapentin or consider Lyrica - Podiatrist to evaluate for structural etiologies such as plantar fasciitis - Botox - will call to schedule   Orders Placed This Encounter  Procedures  . Hemoglobin A1c  . B12 and Folate Panel  . Methylmalonic acid, serum  . Vitamin B1  . Vitamin B6  . Heavy metals, blood  . Multiple Myeloma Panel (SPEP&IFE w/QIG)  . ANA, IFA (with reflex)   Meds ordered this encounter  Medications  .  gabapentin (NEURONTIN) 300 MG capsule    Sig: Take 3 capsules (900 mg total) by mouth 3 (three) times daily.    Dispense:  270 capsule    Refill:  11    Cc: Erline Levine, MD,  Deland Pretty, MD  Sarina Ill, MD  [facility] [address] [place],  [address]  Phone [phone] Fax [phone]  I spent over 90 minutes of face-to-face and non-face-to-face time with patient on the  1. Other polyneuropathy    diagnosis.  This included previsit chart review, lab review, study review, order entry, electronic health record documentation, patient education on the different diagnostic and therapeutic options, counseling and coordination of care, risks and benefits of management, compliance, or risk factor reduction

## 2020-08-28 ENCOUNTER — Encounter: Payer: Self-pay | Admitting: Neurology

## 2020-08-28 DIAGNOSIS — G629 Polyneuropathy, unspecified: Secondary | ICD-10-CM | POA: Insufficient documentation

## 2020-08-29 ENCOUNTER — Telehealth: Payer: Self-pay | Admitting: *Deleted

## 2020-08-29 ENCOUNTER — Other Ambulatory Visit: Payer: Self-pay

## 2020-08-29 ENCOUNTER — Telehealth: Payer: Self-pay | Admitting: Neurology

## 2020-08-29 DIAGNOSIS — I480 Paroxysmal atrial fibrillation: Secondary | ICD-10-CM

## 2020-08-29 DIAGNOSIS — I5181 Takotsubo syndrome: Secondary | ICD-10-CM

## 2020-08-29 MED ORDER — HYDROCHLOROTHIAZIDE 25 MG PO TABS: 25.0000 mg | ORAL_TABLET | Freq: Every day | ORAL | 2 refills | Status: DC

## 2020-08-29 MED ORDER — ATORVASTATIN CALCIUM 10 MG PO TABS: ORAL_TABLET | ORAL | 2 refills | Status: DC

## 2020-08-29 MED ORDER — HYDRALAZINE HCL 25 MG PO TABS: 25.0000 mg | ORAL_TABLET | Freq: Two times a day (BID) | ORAL | 2 refills | Status: DC

## 2020-08-29 NOTE — Telephone Encounter (Signed)
The patient asked for update on Botox. I advised someone would be calling her.

## 2020-08-29 NOTE — Telephone Encounter (Signed)
Pt called, Dr. Jaynee Eagles said would send a compound cream to the pharmacy. Pharmacy said they do not a prescription for a compound cream. Would like for the nurse to call me.

## 2020-08-29 NOTE — Telephone Encounter (Signed)
I will get a [facility] order sheet ready.

## 2020-08-29 NOTE — Telephone Encounter (Signed)
[facility] ([facility]) order form completed, signed by MD, and faxed to [facility]. Received a receipt of confirmation.  Compound cream: Amantadine 8%, Baclofen 2%, Gabapentin 6%, Amitriptyline 4%, Bupivocaine 2%, Clonidine 0.2%.   Sig: Apply 1-2 grams to the affected area 3-4 times daily. Dispense 240 grams with 5 refills.   I also called the patient and discussed the plan with her. Info sheet mailed to pt and she is aware she will receive a call from [facility]. Her questions were answered. She also asked about the Botox which I will start a new phone note regarding.

## 2020-08-29 NOTE — Telephone Encounter (Signed)
[name], I saw patient on Friday and I would like to try Botox for her neuropathy. 200U (100 per foot). We do this for one other patient. She has medicare and a secondary, I can also use samples the first time. Let me know if you think we can get this approved thanks

## 2020-08-31 DIAGNOSIS — H11823 Conjunctivochalasis, bilateral: Secondary | ICD-10-CM | POA: Diagnosis not present

## 2020-08-31 DIAGNOSIS — Z961 Presence of intraocular lens: Secondary | ICD-10-CM | POA: Diagnosis not present

## 2020-08-31 DIAGNOSIS — H52203 Unspecified astigmatism, bilateral: Secondary | ICD-10-CM | POA: Diagnosis not present

## 2020-08-31 NOTE — Telephone Encounter (Signed)
Absolutely, charge sheet ready and is pending Dr Cathren Laine signature.

## 2020-09-05 ENCOUNTER — Other Ambulatory Visit: Payer: Self-pay

## 2020-09-05 MED ORDER — AMLODIPINE BESYLATE 5 MG PO TABS: 5.0000 mg | ORAL_TABLET | Freq: Every day | ORAL | 2 refills | Status: DC

## 2020-09-05 MED ORDER — ENALAPRIL MALEATE 20 MG PO TABS: 20.0000 mg | ORAL_TABLET | Freq: Two times a day (BID) | ORAL | 2 refills | Status: DC

## 2020-09-05 NOTE — Telephone Encounter (Signed)
Received fax from [facility] notifying Dr Jaynee Eagles of substitution:   Formula:  Meloxicam 0.5% Doxepin 3% Amantadine 3% Dextromethorphan 2% Lidocaine 2%

## 2020-09-05 NOTE — Telephone Encounter (Signed)
Received charge sheet for 200 units of Botox for G62.89 (polyneuropathy). I called patient's insurance, [employer] Medicare, and spoke with Harmon Pier to check PA requirements for [id number], [id number], and [id number]. Harmon Pier states no PA is required and patient can be B/B. Reference [phone]. I called the patient and LVM to schedule.

## 2020-09-08 LAB — HEAVY METALS, BLOOD
Arsenic: 6 ug/L (ref 2–23)
Lead, Blood: 1 ug/dL (ref 0–4)
Mercury: 2 ug/L (ref 0.0–14.9)

## 2020-09-08 LAB — METHYLMALONIC ACID, SERUM: Methylmalonic Acid: 271 nmol/L (ref 0–378)

## 2020-09-08 LAB — VITAMIN B1: Thiamine: 211.8 nmol/L — ABNORMAL HIGH (ref 66.5–200.0)

## 2020-09-08 LAB — MULTIPLE MYELOMA PANEL, SERUM
Albumin SerPl Elph-Mcnc: 4.3 g/dL (ref 2.9–4.4)
Albumin/Glob SerPl: 1.4 (ref 0.7–1.7)
Alpha 1: 0.3 g/dL (ref 0.0–0.4)
Alpha2 Glob SerPl Elph-Mcnc: 0.8 g/dL (ref 0.4–1.0)
B-Globulin SerPl Elph-Mcnc: 1 g/dL (ref 0.7–1.3)
Gamma Glob SerPl Elph-Mcnc: 1 g/dL (ref 0.4–1.8)
Globulin, Total: 3.1 g/dL (ref 2.2–3.9)
IgA/Immunoglobulin A, Serum: 90 mg/dL (ref 64–422)
IgG (Immunoglobin G), Serum: 910 mg/dL (ref 586–1602)
IgM (Immunoglobulin M), Srm: 44 mg/dL (ref 26–217)
Total Protein: 7.4 g/dL (ref 6.0–8.5)

## 2020-09-08 LAB — B12 AND FOLATE PANEL
Folate: >20 ng/mL (ref >3.0)
Vitamin B-12: 603 pg/mL (ref 232–1245)

## 2020-09-08 LAB — HEMOGLOBIN A1C
Est. average glucose Bld gHb Est-mCnc: 111 mg/dL
Hgb A1c MFr Bld: 5.5 % (ref 4.8–5.6)

## 2020-09-08 LAB — VITAMIN B6: Vitamin B6: 54.2 ug/L — ABNORMAL HIGH (ref 2.0–32.8)

## 2020-09-08 LAB — ANTINUCLEAR ANTIBODIES, IFA: ANA Titer 1: NEGATIVE

## 2020-09-13 MED ORDER — HYDRALAZINE HCL 50 MG PO TABS: 50.0000 mg | ORAL_TABLET | Freq: Two times a day (BID) | ORAL | 0 refills | Status: DC

## 2020-09-13 MED ORDER — HYDRALAZINE HCL 50 MG PO TABS: 50.0000 mg | ORAL_TABLET | Freq: Two times a day (BID) | ORAL | 1 refills | Status: DC

## 2020-09-13 NOTE — Addendum Note (Signed)
Addended by: [name] on: 09/13/2020 02:06 PM   Modules accepted: Orders

## 2020-09-13 NOTE — Telephone Encounter (Signed)
Blood Pressure   Alexis Spark, MD  You 18 minutes ago (9:28 AM)     Yes, please increase hydralazine to 50 mg p.o. twice daily. Let us know how you feel with that. Thank you.   Message text     Medication recommendation to increase hydralazine to 50 mg po bid was endorsed to the pt via mychart, as indicated above by Dr. Meda Coffee. Endorsed to the pt that new med change was sent to her pharmacy on file.  Advised the pt to continue to monitor, and let us know how she feels with this increase.

## 2020-09-24 ENCOUNTER — Other Ambulatory Visit: Payer: Self-pay | Admitting: Cardiology

## 2020-09-27 ENCOUNTER — Other Ambulatory Visit: Payer: Self-pay | Admitting: Internal Medicine

## 2020-09-27 DIAGNOSIS — Z1231 Encounter for screening mammogram for malignant neoplasm of breast: Secondary | ICD-10-CM

## 2020-09-29 ENCOUNTER — Encounter: Payer: Self-pay | Admitting: Cardiology

## 2020-09-29 NOTE — Telephone Encounter (Signed)
Error

## 2020-10-04 ENCOUNTER — Ambulatory Visit: Payer: Medicare Other | Admitting: Neurology

## 2020-10-04 DIAGNOSIS — R252 Cramp and spasm: Secondary | ICD-10-CM

## 2020-10-04 DIAGNOSIS — M62472 Contracture of muscle, left ankle and foot: Secondary | ICD-10-CM

## 2020-10-04 DIAGNOSIS — M62471 Contracture of muscle, right ankle and foot: Secondary | ICD-10-CM | POA: Diagnosis not present

## 2020-10-04 DIAGNOSIS — G6289 Other specified polyneuropathies: Secondary | ICD-10-CM | POA: Diagnosis not present

## 2020-10-04 NOTE — Progress Notes (Signed)
Botox consent signed Botox- 200 units x 1 vial Lot: [id number] Expiration: 06/2023 NDC: 6979-4801-65  Bacteriostatic 0.9% Sodium Chloride- 58mL total Lot: [id number] Expiration: 09/06/2021 NDC: 7078-6754-49  Dx: E01.00 B/B 71219 CPT code and 75883 for additional limb

## 2020-10-04 NOTE — Progress Notes (Signed)
In the balls of the feet and instep (flexor digiotorum brevis, lumbricals,   [id number] CPT code and [phone] for additional limb  10/04/2020: first injections  History: Refractory idiopathic peripheral polyneuropathy. 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 Hx of diabetic(per-diabetic) neuropathy.   Procedure: All procedures documented were medically necessary, reasonable and appropriate based on the patient's history, medical diagnosis and physician opinion. Verbal informed consent was obtained from the patient, patient was informed of potential risk of procedure, including bruising, bleeding, hematoma formation, infection, muscle weakness, muscle pain, numbness, transient hypertension, transient hyperglycemia and transient insomnia among others. All areas injected were topically clean with isopropyl rubbing alcohol. Nonsterile nonlatex gloves were worn during the procedure.   Injections were given bilaterally to the plantar surface of the feet.  The feet were properly sterilized with evenly spaced sites bilaterally injected each with 10 units of botox using a 1 ml 30-gauge needle.  200 units of Botox were used and none was wasted.

## 2020-10-17 ENCOUNTER — Telehealth: Payer: Self-pay | Admitting: Cardiology

## 2020-10-17 NOTE — Telephone Encounter (Signed)
°*  STAT* If patient is at the pharmacy, call can be transferred to refill team.   1. Which medications need to be refilled? (please list name of each medication and dose if known) amLODipine (NORVASC) 5 MG tablet  2. Which pharmacy/location (including street and city if local pharmacy) is medication to be sent to? [id number] - [place], [place] - [address]  3. Do they need a 30 day or 90 day supply? [place]

## 2020-10-18 DIAGNOSIS — M810 Age-related osteoporosis without current pathological fracture: Secondary | ICD-10-CM | POA: Diagnosis not present

## 2020-10-18 MED ORDER — AMLODIPINE BESYLATE 5 MG PO TABS: 5.0000 mg | ORAL_TABLET | Freq: Every day | ORAL | 1 refills | Status: DC

## 2020-10-18 MED ORDER — ENALAPRIL MALEATE 20 MG PO TABS: 20.0000 mg | ORAL_TABLET | Freq: Two times a day (BID) | ORAL | 2 refills | Status: DC

## 2020-10-18 NOTE — Telephone Encounter (Signed)
Pt's medication was sent to pt's pharmacy as requested. Confirmation received.  °

## 2020-11-17 ENCOUNTER — Ambulatory Visit
Admission: RE | Admit: 2020-11-17 | Discharge: 2020-11-17 | Disposition: A | Discharge status: Home / Self Care | Payer: Medicare Other | Source: Ambulatory Visit | Attending: Internal Medicine | Admitting: Internal Medicine

## 2020-11-17 ENCOUNTER — Other Ambulatory Visit: Payer: Self-pay

## 2020-11-17 ENCOUNTER — Ambulatory Visit (HOSPITAL_BASED_OUTPATIENT_CLINIC_OR_DEPARTMENT_OTHER): Payer: Self-pay | Admitting: Obstetrics & Gynecology

## 2020-11-17 DIAGNOSIS — Z1231 Encounter for screening mammogram for malignant neoplasm of breast: Secondary | ICD-10-CM

## 2020-11-21 ENCOUNTER — Other Ambulatory Visit: Payer: Self-pay | Admitting: Internal Medicine

## 2020-11-21 DIAGNOSIS — S0990XA Unspecified injury of head, initial encounter: Secondary | ICD-10-CM | POA: Diagnosis not present

## 2020-11-21 DIAGNOSIS — I5181 Takotsubo syndrome: Secondary | ICD-10-CM

## 2020-11-21 DIAGNOSIS — I480 Paroxysmal atrial fibrillation: Secondary | ICD-10-CM

## 2020-11-21 DIAGNOSIS — R55 Syncope and collapse: Secondary | ICD-10-CM | POA: Diagnosis not present

## 2020-11-21 DIAGNOSIS — I10 Essential (primary) hypertension: Secondary | ICD-10-CM | POA: Diagnosis not present

## 2020-11-21 DIAGNOSIS — R42 Dizziness and giddiness: Secondary | ICD-10-CM | POA: Diagnosis not present

## 2020-11-21 DIAGNOSIS — Z0000 Encounter for general adult medical examination without abnormal findings: Secondary | ICD-10-CM | POA: Diagnosis not present

## 2020-11-21 MED ORDER — ATORVASTATIN CALCIUM 10 MG PO TABS: ORAL_TABLET | ORAL | 1 refills | Status: DC

## 2020-11-21 MED ORDER — HYDROCHLOROTHIAZIDE 25 MG PO TABS: 25.0000 mg | ORAL_TABLET | Freq: Every day | ORAL | 1 refills | Status: DC

## 2020-11-24 ENCOUNTER — Ambulatory Visit: Payer: Medicare Other | Admitting: Neurology

## 2020-11-24 ENCOUNTER — Encounter: Payer: Self-pay | Admitting: Neurology

## 2020-11-24 VITALS — BP 136/83 | HR 53 | Ht 64.0 in | Wt 151.0 lb

## 2020-11-24 DIAGNOSIS — R55 Syncope and collapse: Secondary | ICD-10-CM

## 2020-11-24 NOTE — Patient Instructions (Signed)
What is vasovagal syncope? Vasovagal syncope is a condition that leads to fainting in some people. It's also called neurocardiogenic syncope or reflex syncope. It's the most common cause of fainting. It's usually not harmful and not a sign of a more serious problem.  Many nerves connect with your heart and blood vessels. These nerves help control the speed and force of your heartbeat. They also regulate blood pressure by controlling whether your blood vessels widen or tighten. Usually, these nerves coordinate their actions so you always get enough blood to your brain. Under certain situations, these nerves might give an inappropriate signal. This might cause your blood vessels to open wide. At the same time, your heartbeat may slow down. Blood can pool in your legs which leads to a drop in blood pressure, and not enough of it may reach the brain. If that happens, you may briefly lose consciousness. When you lie or fall down, blood flow to the brain resumes.  Vasovagal syncope is quite common. It most often affects children and young adults, but it can happen at any age. It happens to men and women in about equal numbers. Unlike some other causes of fainting, vasovagal syncope does not signal an underlying problem with the heart or brain.  What causes vasovagal syncope? Several triggers can cause vasovagal syncope. To help reduce the risk of fainting, you can stay away from some of these triggers such as:  Standing for long periods Excess heat Intense emotion, such as fear Intense pain The sight of blood or a needle Prolonged exercise Dehydration Skipping meals Other triggers include:  Urinating Swallowing Coughing Having a bowel movement What are the symptoms of vasovagal syncope? Fainting is the defining symptom of vasovagal syncope. Often you may have certain symptoms before actually fainting such as:  Nausea Warmth Turning pale Getting sweaty palms Feeling dizzy or  lightheaded Blurred vision If you can lie down at the first sign of these symptoms, you will often be able to prevent fainting. When it happens, this type of fainting almost always happens in a sitting or standing position. Not everyone notices symptoms before fainting, however.  When a person does faint, lying down restores blood flow to the brain. Consciousness should return fairly quickly. You might not feel normal for a little while after you faint. You might feel depressed or fatigued for a short time. Some people even feel nauseous and may vomit.  Some people have only 1 or 2 episodes of vasovagal syncope in their life. For others, the problem is more chronic and happens with no warning.  How is vasovagal syncope diagnosed? Your doctor will review your medical history and do a physical exam. This will probably include measuring the blood pressure while lying down, seated, and then standing. Your doctor will likely do an electrocardiogram (ECG) as well, to evaluate the heart's rhythm. For many children and young adults, this may be all that is needed. Usually, the doctor can safely assume that the fainting is due to vasovagal syncope, and not some form of syncope that is more dangerous.  Sometimes the doctor needs to check for other possible causes for fainting. Because some causes of fainting are dangerous, the doctor will want to rule out these other causes. Your doctor might use tests such as the following:  Continuous portable ECG monitoring, to further analyze heart rhythms Echocardiogram, to examine blood flow in the heart and heart motion Exercise stress testing, to see how your heart works during exercise Blood  work, only if your doctor is suspicious for an abnormality If these tests are normal, you might need something called a "tilt table test." For this test, you lie down on a padded table. Someone measures your heart rate and blood pressure while you are lying down and then tilted  up for a period of time. Sometime medicine is also given to trigger a fainting response. If you have vasovagal syncope, you may faint during the upward tilt.  How is vasovagal syncope treated? Watch for the warning signs of vasovagal syncope, like dizziness, nausea, or sweaty palms. If you have a history of vasovagal syncope and think you are about to faint, lie down right away. Tensing your arms or crossing your legs can help prevent fainting. Passively raising or propping up your legs in the air can also help.  To immediately treat someone who has fainted from vasovagal syncope, help the person lie down and lift their legs up in the air. This will restore blood flow to the brain, and the person should quickly regain consciousness. The person should lie down for a little while afterwards.  If you have had episodes of vasovagal syncope, your doctor might make some suggestions on how to help prevent fainting. These might include:  Avoiding triggers, such as standing for a long time or the sight of blood Moderate exercise training Discontinuing medicines that lower blood pressure, like diuretics Eating a higher salt diet, to help keep up blood volume Drinking plenty of fluids, to maintain blood volume Wearing compression stockings or abdominal binders Occasionally, you may need medicine to help control vasovagal syncope. However, research on these medicines has revealed uncertain benefits in vasovagal syncope. These are usually only considered when a person has multiple episodes of fainting. Some of the medicines your doctor may advise a trial of include:  Alpha-1-adrenergic agonists, to increase blood pressure Corticosteroids, to help increase the sodium and fluid levels Serotonin reuptake inhibitors (SSRIs), to moderate the nervous system response If these medicines are ineffective, doctors sometimes try orthostatic training. This method uses a tilt table to gradually increase the amount of time  spent upright. Rarely, in cases where a significant slowing of the heartbeat or pausing is detected, a heart pacemaker is needed.  What are possible complications of vasovagal syncope? Vasovagal syncope itself is generally not dangerous. Of course, fainting can be dangerous if it happens at certain times, like while driving. Most people with rare episodes of vasovagal syncope can drive safely. If you have chronic syncope that is not under control, your doctor may advise against driving. This is especially likely if you don't usually have warning signs before you faint. Ask your doctor about what is safe for you to do.  When should I call my healthcare provider? See a doctor right away if you have recurrent episodes of passing out or other related problems.  Key points about vasovagal syncope Vasovagal syncope is the most common cause of fainting. It happens when the blood vessels open too wide or the heartbeat slows, causing a temporary lack of blood flow to the brain. It's generally not a dangerous condition. To prevent fainting, stay out of hot places and don't stand for long periods. If you feel lightheaded, nauseous, or sweaty, lie down right away and raise your legs. Most people with occasional vasovagal syncope need to make only lifestyle changes such as drinking more fluids and eating more salt. Some people may need medicine or even a heart pacemaker. Next steps Tips to help  you get the most from a visit to your healthcare provider:  Know the reason for your visit and what you want to happen. Before your visit, write down questions you want answered. Bring someone with you to help you ask questions and remember what your provider tells you. At the visit, write down the name of a new diagnosis, and any new medicines, treatments, or tests. Also write down any new instructions your provider gives you. Know why a new medicine or treatment is prescribed, and how it will help you. Also know  what the side effects are. Ask if your condition can be treated in other ways. Know why a test or procedure is recommended and what the results could mean. Know what to expect if you do not take the medicine or have the test or procedure. If you have a follow-up appointment, write down the date, time, and purpose for that visit. Know how you can contact your provider if you have questions.  Syncope  Syncope refers to a condition in which a person temporarily loses consciousness. Syncope may also be called fainting or passing out. It is caused by a sudden decrease in blood flow to the brain. Even though most causes of syncope are not dangerous, syncope can be a sign of a serious medical problem. Your health care provider may do tests to find the reason why you are having syncope. Signs that you may be about to faint include:  Feeling dizzy or light-headed.  Feeling nauseous.  Seeing all white or all black in your field of vision.  Having cold, clammy skin. If you faint, get medical help right away. Call your local emergency services (911 in the [place] Do not drive yourself to the hospital. Follow these instructions at home: Pay attention to any changes in your symptoms. Take these actions to stay safe and to help relieve your symptoms: Lifestyle  Do not drive, use machinery, or play sports until your health care provider says it is okay.  Do not drink alcohol.  Do not use any products that contain nicotine or tobacco, such as cigarettes and e-cigarettes. If you need help quitting, ask your health care provider.  Drink enough fluid to keep your urine pale yellow. General instructions  Take over-the-counter and prescription medicines only as told by your health care provider.  If you are taking blood pressure or heart medicine, get up slowly and take several minutes to sit and then stand. This can reduce dizziness or light-headedness.  Have someone stay with you until you feel  stable.  If you start to feel like you might faint, lie down right away and raise (elevate) your feet above the level of your heart. Breathe deeply and steadily. Wait until all the symptoms have passed.  Keep all follow-up visits as told by your health care provider. This is important. Get help right away if you:  Have a severe headache.  Faint once or repeatedly.  Have pain in your chest, abdomen, or back.  Have a very fast or irregular heartbeat (palpitations).  Have pain when you breathe.  Are bleeding from your mouth or rectum, or you have black or tarry stool.  Have a seizure.  Are confused.  Have trouble walking.  Have severe weakness.  Have vision problems. These symptoms may represent a serious problem that is an emergency. Do not wait to see if your symptoms will go away. Get medical help right away. Call your local emergency services (911 in the [place] Do  not drive yourself to the hospital. Summary  Syncope refers to a condition in which a person temporarily loses consciousness. It is caused by a sudden decrease in blood flow to the brain.  Signs that you may be about to faint include dizziness, feeling light-headed, feeling nauseous, sudden vision changes, or cold, clammy skin.  Although most causes of syncope are not dangerous, syncope can be a sign of a serious medical problem. If you faint, get medical help right away. This information is not intended to replace advice given to you by your health care provider. Make sure you discuss any questions you have with your health care provider. Document Revised: 12/03/2019 Document Reviewed: 12/03/2019 [employer] Patient Education  [employer].

## 2020-11-24 NOTE — Progress Notes (Signed)
[facility]    Provider:  Dr Jaynee Eagles Requesting Provider: Deland Pretty, MD Primary Care Provider:  Deland Pretty, MD  CC:  syncope  11/24/2020: Saturday evening, she was very tired, hot tub, she had some recent episodes dizziness and lightheadedness, she got up and felt dizzy, knew she was going to pass out, Daughter heard it thought she was in the kitchen, she had aleaten a big meal prior, she hit her head, vomited, she was laying there and felt weak, she scooted to the bedroom and used the bed to help get up, she denied any post-concussion symptoms, this was just a few days ago and feeling back to her baseline except for some back pain and soreness but no persistent post-concussive symptoms. BP was 90/40. She had a hematoma on her head after some ice it improved. No urination or tongue biting. No prior hx seizures.   HPI:  [name] is a 80 y.o. female here as requested by Deland Pretty, MD for peripheral polyneuropathy.  She has a past medical history of thyroid disease (Graves' disease), syncope, chronic subdural hematoma, polymyalgia rheumatica, long-term NSAID use and ringing in the ears, history of dizziness, sinus bradycardia, pulmonary hypertension, tricuspid regurgitation, osteoarthritis, mitral valve prolapse, lumbar vertebral fracture, IBS, hyponatremia/potassium deficiency, hyperlipidemia, hypertension, heart murmur, A. fib.  I reviewed Dr. Melven Sartorius notes: Patient has pins-and-needles in both of her feet to the balls of her feet and numbness in all of her toes in both feet, she feels the right side is more affected than the left side, she denies any upper extremity numbness, on discussion it appeared to be most consistent with a peripheral polyneuropathy, no buttocks pain or significant sciatica, he referred her to neurology for evaluation for peripheral neuropathy, she is on Neurontin 100 mg 3 times daily, she does have back pain but as above no radicular symptoms  pain 6 out of 10, onset was in September 2019 duration varies and patient was instructed to continue medication management.  Started in the right foot and then the left one in the balls of the feet. Now burning in the instep. In the balls of the feet, its red and sore and hurts. (Also hurts on the left insole which is more structural, feels good to rub, likely structural). Started 1.5 years ago. Slowly worsening. Continuous, no weakness, no gait abnormality, No other focal neurologic deficits, associated symptoms, inciting events or modifiable factors.  Reviewed notes, labs and imaging from outside physicians, which showed: see above  Review of Systems: Patient complains of symptoms per HPI as well as the following symptoms: syncope . Pertinent negatives and positives per HPI. All others negative .   Social History   Socioeconomic History  . Marital status: Widowed    Spouse name: Not on file  . Number of children: 1  . Years of education: Not on file  . Highest education level: Not on file  Occupational History  . Occupation: Homemaker  Tobacco Use  . Smoking status: Never Smoker  . Smokeless tobacco: Never Used  Vaping Use  . Vaping Use: Never used  Substance and Sexual Activity  . Alcohol use: Yes    Alcohol/week: 0.0 standard drinks    Comment: Rare  . Drug use: No  . Sexual activity: Never    Birth control/protection: Post-menopausal    Comment: 1st intercourse 80 yo-Fewer than 5 partners  Other Topics Concern  . Not on file  Social History Narrative   3 caffeine drinks daily  Widowed   Retired      Lives alone   Right handed   Caffeine: 1.5 cup/day   Social Determinants of Radio broadcast assistant Strain: Not on file  Food Insecurity: Not on file  Transportation Needs: Not on file  Physical Activity: Not on file  Stress: Not on file  Social Connections: Not on file  Intimate Partner Violence: Not on file    Family History  Problem Relation Age of  Onset  . Heart attack Mother 16  . Hypertension Mother   . Heart disease Mother   . Stroke Mother   . Heart attack Father 87  . Coronary artery disease Father        Multiple Family Members  . Hypertension Father   . Heart disease Father   . Esophageal cancer Paternal Grandmother   . Colon cancer Maternal Grandfather   . Heart disease Maternal Grandfather   . Irritable bowel syndrome Sister        More family members on father side of   . Hypertension Sister   . Heart disease Sister   . Breast cancer Sister 61  . Breast cancer Paternal Aunt        Age 90's  . Heart disease Paternal Grandfather   . Rectal cancer Neg Hx   . Stomach cancer Neg Hx     Past Medical History:  Diagnosis Date  . Abnormal EKG   . Atrial fibrillation ([place])   . Basal cell carcinoma of nose    removed w/MOHs  . CAD (coronary artery disease)    a. NSTEMI 06/2013 => LHC:  mLAD 40, oD2 70 (small), pOM 20, mRCA 20, mid to dist ant HK, EF 30-35% (c/w Tako-Tsubo CM)  . Cataract   . Chronic cystitis   . CIN I (cervical intraepithelial neoplasia I)   . Dyslipidemia   . Ejection fraction    EF 65%, echo, 2011  . Heart murmur    MVP   . Hemorrhoids   . History of colon polyps   . HTN (hypertension)   . Hx of colonoscopy   . Hyperlipidemia   . Hyperthyroidism   . Hyponatremia   . 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 IBS (irritable bowel syndrome)   . Lumbar vertebral fracture (HCC)   . Mitral valve prolapse    echo, January, 2011, moderate prolapse with your leaflet, trivial MR   Antibiotic required for procedures  . MVP (mitral valve prolapse)   . Orthostasis    Mild orthostatic change when she stands  . Osteoarthritis    "do not have RA" (06/17/2013)  . Osteoporosis 01/2014, 03/2019   01/2014 T score -2.3 followed by Dr. Marcelino Scot, 03/2019 T score -2.3  . Polymyalgia rheumatica ([place])   . PONV (postoperative nausea and vomiting)   . Potassium (K) deficiency    5  potassium requirement over time  . 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 Syncope    August, 200 weight, dehydration  . 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 EF 30-35% at [facility] 06/2013;  b.  f/u Echo (06/18/13):  EF 60-65%, normal wall motion, Gr 1 DD, mild MVP of post leaflet, mod TR, PASP 63  . Thyroid disease   . Tricuspid regurgitation    mild to moderate, echo, January, 2011, PA pressure 38 mm mercury    Patient Active Problem List   Diagnosis Date Noted  .  Peripheral neuropathy 08/28/2020  . Pulmonary HTN ([place]) 07/25/2015  . 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 Sinus bradycardia 08/17/2013  . Anticoagulation adequate 07/20/2013  . H/O Graves' disease 06/26/2013  . Abnormal EKG   . Takotsubo syndrome 06/19/2013  . Atrial fibrillation ([place]) 06/19/2013  . Orthostasis   . H/O dizziness 12/26/2012  . Abnormal LFTs 12/26/2012  . NSAID long-term use 12/05/2012  . Acute epigastric pain 12/05/2012  . CIN I (cervical intraepithelial neoplasia I)   . Ringing in ears 05/09/2011  . Dyslipidemia   . HTN (hypertension)   . Mitral valve prolapse   . Tricuspid regurgitation   . Subdural hematoma ([place])   . Ejection fraction   . Potassium (K) deficiency   . Syncope   . Personal history of colonic polyps 02/06/2011  . Personal history of failed moderate sedation 02/06/2011  . RHINITIS 08/23/2010  . ARTHRITIS, KNEE 08/23/2010  . SKIN CANCER, HX OF 02/24/2010  . FX CLSD SKL VLT W/HEM NEC, CONCUSSION NOS 04/07/2007  . POLYMYALGIA RHEUMATICA 04/02/2007  . Osteoporosis 03/11/2007    Past Surgical History:  Procedure Laterality Date  . CARDIAC CATHETERIZATION  06/17/2013  . COLONOSCOPY  12/23/2003   normal (indications: prior adenomas and grandfather with colon cancer)  . COLPOSCOPY    . EXCISIONAL HEMORRHOIDECTOMY  2003  . LEFT HEART CATHETERIZATION WITH CORONARY ANGIOGRAM N/A 06/17/2013   Procedure: LEFT HEART CATHETERIZATION WITH  CORONARY ANGIOGRAM;  Surgeon: Peter M Martinique, MD;  Location: [facility] CATH LAB;  Service: Cardiovascular;  Laterality: N/A;  . MOHS SURGERY Left 2011   "side of my nose" (06/17/2013)  . TONSILLECTOMY    . TOTAL KNEE ARTHROPLASTY  08/2010  . TOTAL KNEE ARTHROPLASTY  02/25/2012   Procedure: TOTAL KNEE ARTHROPLASTY;  Surgeon: Gearlean Alf, MD;  Location: [facility] ORS;  Service: Orthopedics;  Laterality: Right;    Current Outpatient Medications  Medication Sig Dispense Refill  . ALPHA LIPOIC ACID PO Take by mouth.    Marland Kitchen amLODipine (NORVASC) 5 MG tablet Take 1 tablet (5 mg total) by mouth daily. 90 tablet 1  . atorvastatin (LIPITOR) 10 MG tablet Take 1 tablet by mouth every day with breakfast. 90 tablet 1  . B Complex Vitamins (B COMPLEX PO) Take by mouth.    . Biotin 5000 MCG CAPS Take 1 capsule by mouth daily.     . Calcium Carbonate (CALTRATE 600 PO) Take 1 tablet by mouth 2 (two) times a day.     . Cholecalciferol 25 MCG (1000 UT) capsule Take 1,000 Units by mouth daily.    Marland Kitchen denosumab (PROLIA) 60 MG/ML SOSY injection Inject 60 mg into the skin every 6 (six) months.    . diclofenac sodium (VOLTAREN) 1 % GEL 1 application.  2  . enalapril (VASOTEC) 20 MG tablet Take 1 tablet (20 mg total) by mouth 2 (two) times daily. 180 tablet 2  . EVENING PRIMROSE OIL PO Take 1 capsule by mouth 2 (two) times a day.     . gabapentin (NEURONTIN) 300 MG capsule Take 3 capsules (900 mg total) by mouth 3 (three) times daily. 270 capsule 11  . hydrALAZINE (APRESOLINE) 50 MG tablet Take 1 tablet (50 mg total) by mouth 2 (two) times daily. 180 tablet 1  . hydrochlorothiazide (HYDRODIURIL) 25 MG tablet Take 1 tablet (25 mg total) by mouth daily. 90 tablet 1  . levothyroxine (SYNTHROID) 75 MCG tablet TAKE 1 TABLET BY MOUTH EVERY DAY 90 tablet 1  . MELATONIN PO Take by mouth as  needed.    . meloxicam (MOBIC) 15 MG tablet Take 15 mg by mouth daily.    . methylcellulose packet Take 1 each by mouth daily. CITRICEL    .  metroNIDAZOLE (METROCREAM) 0.75 % cream Apply 1 application topically 2 (two) times daily.    . NONFORMULARY OR COMPOUNDED ITEM Compound cream: Meloxicam 0.5% Doxepin 3% Amantadine 3% Dextromethorphan 2% Lidocaine 2%  Sig: Apply 1-2 grams to the affected area 3-4 times daily. Dispense 240 grams with 5 refills.    . pantoprazole (PROTONIX) 40 MG tablet Take 1 tablet by mouth daily.    Marland Kitchen tretinoin (RETIN-A) 0.05 % cream Apply 1 application topically at bedtime.    Marland Kitchen zolpidem (AMBIEN) 5 MG tablet Take 5 mg by mouth at bedtime as needed for sleep.     No current facility-administered medications for this visit.    Allergies as of 11/24/2020  . (No Known Allergies)    Vitals: BP 136/83 (BP Location: Right Arm, Patient Position: Sitting)   Pulse (!) 53   Ht 5\' 4"  (1.626 m)   Wt 151 lb (68.5 kg)   BMI 25.92 kg/m  Last Weight:  Wt Readings from Last 1 Encounters:  11/24/20 151 lb (68.5 kg)   Last Height:   Ht Readings from Last 1 Encounters:  11/24/20 5\' 4"  (1.626 m)     Physical exam: repeated and stable Exam: Gen: NAD, conversant, well nourised, well groomed                     CV: RRR, no MRG. No Carotid Bruits. No peripheral edema, warm, nontender Eyes: Conjunctivae clear without exudates or hemorrhage  Neuro: repeated and stable Detailed Neurologic Exam  Speech:    Speech is normal; fluent and spontaneous with normal comprehension.  Cognition:    The patient is oriented to person, place, and time;     recent and remote memory intact;     language fluent;     normal attention, concentration,     fund of knowledge Cranial Nerves:    The pupils are equal, round, and reactive to light. The pupils are too small to visualize fundi. Visual fields are full to finger confrontation. Extraocular movements are intact. Trigeminal sensation is intact and the muscles of mastication are normal. The face is symmetric. The palate elevates in the midline. Hearing intact. Voice is  normal. Shoulder shrug is normal. The tongue has normal motion without fasciculations.   Coordination:    No dysmetria or ataxia  Gait:    Normal native gait  Motor Observation:    No asymmetry, no atrophy, and no involuntary movements noted. Tone:    Normal muscle tone.    Posture:    Posture is normal. normal erect    Strength:    Strength is V/V in the upper and lower limbs.      Sensation: intact to LT dec pin prick and temp to mid foot, impaired vibration distally      Reflex Exam:  DTR's: trace AJs.   Trace Ajs.  Deep tendon reflexes in the upper and lower extremities are symmetrical bilaterally.   Toes:    The toes are downgoing bilaterally.   Clonus:    Clonus is absent.    Assessment/Plan: Patient here today for syncope and was last seen for peripheral neuropathy.  Syncopal episode after a hot shower: Likely vasovagal syncope. CT head pending. No more episodes, no residual symptoms. No seizure-like activity. Reassured patient.   Neuropathy:  -  Given symptoms which are distal predominant, symmetrical, slowly progressive, and an ascending pattern with decreased sensation in mostly pin prick and temp, suspect a predominantly small-fiber Extensive blood tests were ordered and unremarkable. - Botox helped but was extremely painful, will compound a numbing agent and have it sent to her, - The compounded the topical pain cream helps as well (topical with gabapentin, amitriptyline and others to compound) -May consider daily alpha lipoic acid which is an antioxidant that may reduce free radical oxidative stress associated with diabetic and other polyneuropathies, existing evidence suggests that alpha lipoic acid significantly reduces stabbing, lancinating and burning pain and neuropathy with its onset of action as early as 1-2 weeks. - continue Gabapentin or consider Lyrica - Podiatrist to evaluate for structural etiologies such as plantar fasciitis   No orders of the  defined types were placed in this encounter.  No orders of the defined types were placed in this encounter.   Cc: Deland Pretty, MD,  Deland Pretty, MD  Sarina Ill, MD  [facility] [address] [place], [place] [address]  Phone [phone] Fax [phone]  I spent over 40 minutes of face-to-face and non-face-to-face time with patient on the  1. Vasovagal syncope    diagnosis.  This included previsit chart review, lab review, study review, order entry, electronic health record documentation, patient education on the different diagnostic and therapeutic options, counseling and coordination of care, risks and benefits of management, compliance, or risk factor reduction

## 2020-11-25 DIAGNOSIS — M5416 Radiculopathy, lumbar region: Secondary | ICD-10-CM | POA: Diagnosis not present

## 2020-11-25 DIAGNOSIS — M5126 Other intervertebral disc displacement, lumbar region: Secondary | ICD-10-CM | POA: Diagnosis not present

## 2020-11-25 DIAGNOSIS — G8929 Other chronic pain: Secondary | ICD-10-CM | POA: Diagnosis not present

## 2020-11-25 DIAGNOSIS — M4316 Spondylolisthesis, lumbar region: Secondary | ICD-10-CM | POA: Diagnosis not present

## 2020-11-25 DIAGNOSIS — G629 Polyneuropathy, unspecified: Secondary | ICD-10-CM | POA: Diagnosis not present

## 2020-11-25 DIAGNOSIS — M5441 Lumbago with sciatica, right side: Secondary | ICD-10-CM | POA: Diagnosis not present

## 2020-11-30 ENCOUNTER — Telehealth: Payer: Self-pay | Admitting: *Deleted

## 2020-11-30 NOTE — Telephone Encounter (Signed)
Under the instruction of Dr. Jaynee Eagles, I called [employer] and spoke with Erlene Quan.  Provided verbal order for compounded numbing cream for patient to use prior to her Botox injection every 3 months.  The components are benzocaine 20%, tetracaine 4%, and lidocaine 6%.  He stated they usually only last 6 months so he can provide a 30 g tube.  He will contact the patient to discuss pricing.

## 2020-12-01 ENCOUNTER — Encounter (HOSPITAL_BASED_OUTPATIENT_CLINIC_OR_DEPARTMENT_OTHER): Payer: Self-pay | Admitting: Obstetrics & Gynecology

## 2020-12-01 ENCOUNTER — Ambulatory Visit (INDEPENDENT_AMBULATORY_CARE_PROVIDER_SITE_OTHER): Payer: Medicare Other | Admitting: Obstetrics & Gynecology

## 2020-12-01 ENCOUNTER — Other Ambulatory Visit: Payer: Self-pay

## 2020-12-01 VITALS — BP 140/77 | HR 69 | Ht 64.0 in | Wt 150.8 lb

## 2020-12-01 DIAGNOSIS — M810 Age-related osteoporosis without current pathological fracture: Secondary | ICD-10-CM

## 2020-12-01 DIAGNOSIS — Z780 Asymptomatic menopausal state: Secondary | ICD-10-CM | POA: Diagnosis not present

## 2020-12-01 DIAGNOSIS — Z01419 Encounter for gynecological examination (general) (routine) without abnormal findings: Secondary | ICD-10-CM | POA: Diagnosis not present

## 2020-12-01 DIAGNOSIS — N1831 Chronic kidney disease, stage 3a: Secondary | ICD-10-CM | POA: Insufficient documentation

## 2020-12-01 NOTE — Progress Notes (Signed)
80 y.o. G9P3001 Widowed White or Caucasian female here for new patient appointment.  Doing well from gyn standpoint.  Denies vaginal bleeding.    Receiving prolia for osteoporosis treatment.  Dr. Pennie Banter office is administering this.    Remote hx of CIN 1.  Current pap smear guidelines reviewed as pt was unsure if needed pap smears.  Current MMG guidelines reviewed as well.  She is not interested in stopping or decreasing frequency of mammograms.  No LMP recorded. Patient is postmenopausal.          Sexually active: No.  The current method of family planning is post menopausal status.    Exercising: Yes.    Silver sneekers 3 times weekly Smoker:  no  Health Maintenance: Pap:  Not indicated History of abnormal Pap:  no MMG:  11/2020 Colonoscopy:  2019 BMD:   03/2019.  Next BMD planned with Dr. Amil Amen TDaP:  04/2016 Pneumonia vaccine(s):  completed Shingrix:   completed   reports that she has never smoked. She has never used smokeless tobacco. She reports current alcohol use. She reports that she does not use drugs.  Past Medical History:  Diagnosis Date  . Abnormal EKG   . Atrial fibrillation ([place])   . Basal cell carcinoma of nose    removed w/MOHs  . CAD (coronary artery disease)    a. NSTEMI 06/2013 => LHC:  mLAD 40, oD2 70 (small), pOM 20, mRCA 20, mid to dist ant HK, EF 30-35% (c/w Tako-Tsubo CM)  . Cataract   . Chronic cystitis   . CIN I (cervical intraepithelial neoplasia I)   . Dyslipidemia   . Ejection fraction    EF 65%, echo, 2011  . Heart murmur    MVP   . Hemorrhoids   . History of colon polyps   . HTN (hypertension)   . Hx of colonoscopy   . Hyperlipidemia   . Hyperthyroidism   . Hyponatremia   . 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 IBS (irritable bowel syndrome)   . Lumbar vertebral fracture (HCC)   . Mitral valve prolapse    echo, January, 2011, moderate prolapse with your leaflet, trivial MR   Antibiotic required for  procedures  . MVP (mitral valve prolapse)   . Orthostasis    Mild orthostatic change when she stands  . Osteoarthritis    "do not have RA" (06/17/2013)  . Osteoporosis 01/2014, 03/2019   01/2014 T score -2.3 followed by Dr. Marcelino Scot, 03/2019 T score -2.3  . Polymyalgia rheumatica ([place])   . PONV (postoperative nausea and vomiting)   . Potassium (K) deficiency    5 potassium requirement over time  . 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 Syncope    August, 200 weight, dehydration  . 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 EF 30-35% at [facility] 06/2013;  b.  f/u Echo (06/18/13):  EF 60-65%, normal wall motion, Gr 1 DD, mild MVP of post leaflet, mod TR, PASP 63  . Thyroid disease   . Tricuspid regurgitation    mild to moderate, echo, January, 2011, PA pressure 38 mm mercury    Past Surgical History:  Procedure Laterality Date  . CARDIAC CATHETERIZATION  06/17/2013  . COLONOSCOPY  12/23/2003   normal (indications: prior adenomas and grandfather with colon cancer)  . COLPOSCOPY    . EXCISIONAL HEMORRHOIDECTOMY  2003  . LEFT HEART CATHETERIZATION WITH CORONARY ANGIOGRAM N/A 06/17/2013  Procedure: LEFT HEART CATHETERIZATION WITH CORONARY ANGIOGRAM;  Surgeon: Peter M Martinique, MD;  [facility] CATH LAB;  Service: Cardiovascular;  Laterality: N/A;  . MOHS SURGERY Left 2011   "side of my nose" (06/17/2013)  . TONSILLECTOMY    . TOTAL KNEE ARTHROPLASTY  08/2010  . TOTAL KNEE ARTHROPLASTY  02/25/2012   Procedure: TOTAL KNEE ARTHROPLASTY;  Surgeon: Gearlean Alf, MD;  Location: [facility] ORS;  Service: Orthopedics;  Laterality: Right;    Current Outpatient Medications  Medication Sig Dispense Refill  . ALPHA LIPOIC ACID PO Take by mouth.    Marland Kitchen amLODipine (NORVASC) 5 MG tablet Take 1 tablet (5 mg total) by mouth daily. 90 tablet 1  . atorvastatin (LIPITOR) 10 MG tablet Take 1 tablet by mouth every day with breakfast. 90 tablet 1  . B Complex Vitamins (B COMPLEX  PO) Take by mouth.    . Biotin 5000 MCG CAPS Take 1 capsule by mouth daily.     . Calcium Carbonate (CALTRATE 600 PO) Take 1 tablet by mouth 2 (two) times a day.     . Cholecalciferol 25 MCG (1000 UT) capsule Take 1,000 Units by mouth daily.    Marland Kitchen denosumab (PROLIA) 60 MG/ML SOSY injection Inject 60 mg into the skin every 6 (six) months.    . diclofenac sodium (VOLTAREN) 1 % GEL 1 application.  2  . enalapril (VASOTEC) 20 MG tablet Take 1 tablet (20 mg total) by mouth 2 (two) times daily. 180 tablet 2  . EVENING PRIMROSE OIL PO Take 1 capsule by mouth 2 (two) times a day.     . gabapentin (NEURONTIN) 300 MG capsule Take 3 capsules (900 mg total) by mouth 3 (three) times daily. 270 capsule 11  . hydrALAZINE (APRESOLINE) 50 MG tablet Take 1 tablet (50 mg total) by mouth 2 (two) times daily. 180 tablet 1  . hydrochlorothiazide (HYDRODIURIL) 25 MG tablet Take 1 tablet (25 mg total) by mouth daily. 90 tablet 1  . levothyroxine (SYNTHROID) 75 MCG tablet TAKE 1 TABLET BY MOUTH EVERY DAY 90 tablet 1  . MELATONIN PO Take by mouth as needed.    . meloxicam (MOBIC) 15 MG tablet Take 15 mg by mouth daily.    . methylcellulose packet Take 1 each by mouth daily. CITRICEL    . metroNIDAZOLE (METROCREAM) 0.75 % cream Apply 1 application topically 2 (two) times daily.    . NONFORMULARY OR COMPOUNDED ITEM Compound cream: Meloxicam 0.5% Doxepin 3% Amantadine 3% Dextromethorphan 2% Lidocaine 2%  Sig: Apply 1-2 grams to the affected area 3-4 times daily. Dispense 240 grams with 5 refills.    . pantoprazole (PROTONIX) 40 MG tablet Take 1 tablet by mouth daily.    Marland Kitchen tretinoin (RETIN-A) 0.05 % cream Apply 1 application topically at bedtime.    Marland Kitchen zolpidem (AMBIEN) 5 MG tablet Take 5 mg by mouth at bedtime as needed for sleep.     No current facility-administered medications for this visit.    Family History  Problem Relation Age of Onset  . Heart attack Mother 102  . Hypertension Mother   . Heart disease  Mother   . Stroke Mother   . Heart attack Father 62  . Coronary artery disease Father        Multiple Family Members  . Hypertension Father   . Heart disease Father   . Esophageal cancer Paternal Grandmother   . Colon cancer Maternal Grandfather   . Heart disease Maternal Grandfather   . Irritable  bowel syndrome Sister        More family members on father side of   . Hypertension Sister   . Heart disease Sister   . Breast cancer Sister 80  . Breast cancer Paternal Aunt        Age 51's  . Heart disease Paternal Grandfather   . Rectal cancer Neg Hx   . Stomach cancer Neg Hx     Review of Systems  All other systems reviewed and are negative.   Exam:   BP 140/77 (BP Location: Right Arm, Patient Position: Sitting, Cuff Size: Small)   Pulse 69   Ht 5\' 4"  (1.626 m) Comment: reported  Wt 150 lb 12.8 oz (68.4 kg)   BMI 25.88 kg/m   Height: 5\' 4"  (162.6 cm) (reported)  General appearance: alert, cooperative and appears stated age Head: Normocephalic, without obvious abnormality, atraumatic Neck: no adenopathy, supple, symmetrical, trachea midline and thyroid normal to inspection and palpation Lungs: clear to auscultation bilaterally Breasts: normal appearance, no masses or tenderness Heart: regular rate and rhythm Abdomen: soft, non-tender; bowel sounds normal; no masses,  no organomegaly Extremities: extremities normal, atraumatic, no cyanosis or edema Skin: Skin color, texture, turgor normal. No rashes or lesions Lymph nodes: Cervical, supraclavicular, and axillary nodes normal. No abnormal inguinal nodes palpated Neurologic: Grossly normal   Pelvic: External genitalia:  no lesions              Urethra:  normal appearing urethra with no masses, tenderness or lesions              Bartholins and Skenes: normal                 Vagina: normal appearing vagina with normal color and no discharge, no lesions              Cervix: no lesions              Pap taken: No. Bimanual  Exam:  Uterus:  normal size, contour, position, consistency, mobility, non-tender              Adnexa: normal adnexa and no mass, fullness, tenderness               Rectovaginal: Confirms               Anus:  normal sphincter tone, no lesions  Chaperone, Octaviano Batty, CMA, was present for exam.  Assessment/Plan: 1. Encntr for gyn exam (general) (routine) w/o abn findings - pap smear not indicated - MMG 11/2020 - colonoscopy 2019.  No follow up for routine purposes recommended - BMD 03/2019.  Follow up planned this year with Dr. Amil Amen - vaccines updated - screening lab work done with Dr. Shelia Media  2. Postmenopausal - no recent HRT  3. Age-related osteoporosis without current pathological fracture  Total time with pt 35 minutes due to addressing current screening guidelines, reasons for stopping pap smears, decreasing frequency of mammograms, why screening colonoscopies are stopped, reasoning behind less frequent or no pelvic exams.  Pt still desires to be seen yearly.

## 2020-12-03 DIAGNOSIS — I10 Essential (primary) hypertension: Secondary | ICD-10-CM | POA: Diagnosis not present

## 2020-12-03 DIAGNOSIS — M810 Age-related osteoporosis without current pathological fracture: Secondary | ICD-10-CM | POA: Diagnosis not present

## 2020-12-03 DIAGNOSIS — E039 Hypothyroidism, unspecified: Secondary | ICD-10-CM | POA: Diagnosis not present

## 2020-12-05 ENCOUNTER — Ambulatory Visit
Admission: RE | Admit: 2020-12-05 | Discharge: 2020-12-05 | Disposition: A | Discharge status: Home / Self Care | Payer: Medicare Other | Source: Ambulatory Visit | Attending: Internal Medicine | Admitting: Internal Medicine

## 2020-12-05 ENCOUNTER — Other Ambulatory Visit: Payer: Self-pay

## 2020-12-05 DIAGNOSIS — R55 Syncope and collapse: Secondary | ICD-10-CM | POA: Diagnosis not present

## 2020-12-05 DIAGNOSIS — S0990XA Unspecified injury of head, initial encounter: Secondary | ICD-10-CM

## 2020-12-19 DIAGNOSIS — Z791 Long term (current) use of non-steroidal anti-inflammatories (NSAID): Secondary | ICD-10-CM | POA: Diagnosis not present

## 2020-12-19 DIAGNOSIS — R0982 Postnasal drip: Secondary | ICD-10-CM | POA: Diagnosis not present

## 2021-01-03 ENCOUNTER — Ambulatory Visit: Payer: Medicare Other | Admitting: Neurology

## 2021-01-03 DIAGNOSIS — G6289 Other specified polyneuropathies: Secondary | ICD-10-CM

## 2021-01-03 DIAGNOSIS — M62471 Contracture of muscle, right ankle and foot: Secondary | ICD-10-CM

## 2021-01-03 DIAGNOSIS — M810 Age-related osteoporosis without current pathological fracture: Secondary | ICD-10-CM | POA: Diagnosis not present

## 2021-01-03 DIAGNOSIS — M62472 Contracture of muscle, left ankle and foot: Secondary | ICD-10-CM

## 2021-01-03 DIAGNOSIS — R252 Cramp and spasm: Secondary | ICD-10-CM | POA: Diagnosis not present

## 2021-01-03 DIAGNOSIS — I10 Essential (primary) hypertension: Secondary | ICD-10-CM | POA: Diagnosis not present

## 2021-01-03 DIAGNOSIS — E1142 Type 2 diabetes mellitus with diabetic polyneuropathy: Secondary | ICD-10-CM

## 2021-01-03 DIAGNOSIS — E039 Hypothyroidism, unspecified: Secondary | ICD-10-CM | POA: Diagnosis not present

## 2021-01-03 NOTE — Progress Notes (Signed)
In the balls of the feet and instep (flexor digiotorum brevis, lumbricals,   [id number] CPT code and [phone] for additional limb  01/04/2021: > 50% improvement in pain, continue current dose 10/04/2020: first injections  History: Refractory idiopathic peripheral diabetic polyneuropathy. 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 Hx of diabetic(per-diabetic) neuropathy.   Procedure: All procedures documented were medically necessary, reasonable and appropriate based on the patient's history, medical diagnosis and physician opinion. Verbal informed consent was obtained from the patient, patient was informed of potential risk of procedure, including bruising, bleeding, hematoma formation, infection, muscle weakness, muscle pain, numbness, transient hypertension, transient hyperglycemia and transient insomnia among others. All areas injected were topically clean with isopropyl rubbing alcohol. Nonsterile nonlatex gloves were worn during the procedure.   Injections were given bilaterally to the plantar surface of the feet.  The feet were properly sterilized with evenly spaced sites bilaterally injected each with 10 units of botox using a 1 ml 30-gauge needle.  200 units of Botox were used and none was wasted.

## 2021-01-03 NOTE — Progress Notes (Signed)
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

## 2021-01-04 ENCOUNTER — Telehealth: Payer: Self-pay | Admitting: *Deleted

## 2021-01-04 NOTE — Telephone Encounter (Signed)
-----   Message from Melvenia Beam, MD sent at 01/03/2021  3:49 PM EDT ----- Regarding: Qutenza Can we start the approval process for Qutenza for patient please?

## 2021-01-04 NOTE — Telephone Encounter (Signed)
To start process for qutenza.

## 2021-01-05 NOTE — Telephone Encounter (Signed)
Received from [employer] coverage team fax relating it we needed their assistance in doing a prior authorization to fax clinical information I did fax and received fax confirmation  To [phone].

## 2021-01-05 NOTE — Telephone Encounter (Signed)
Submitted to [employer] 01-05-21.

## 2021-01-10 NOTE — Telephone Encounter (Signed)
Received fax today from [place] needing more information.  I called pt and asked if she had tried duloxetine or pregabalin.  She said no.  I refaxed back to [employer] with this information. Awaiting decision.

## 2021-01-17 NOTE — Telephone Encounter (Signed)
I called [employer] denied 01-10-21 for qutenza.  (Not tried duloxetine or pregabalin).

## 2021-01-18 ENCOUNTER — Other Ambulatory Visit: Payer: Self-pay

## 2021-01-18 MED ORDER — HYDRALAZINE HCL 50 MG PO TABS: 50.0000 mg | ORAL_TABLET | Freq: Two times a day (BID) | ORAL | 0 refills | Status: DC

## 2021-01-18 NOTE — Telephone Encounter (Signed)
I called patient.  I relayed the message from Dr. Lavell Anchors that insurance denied Qutenza until step therapy of pregabalin or duloxetine has been tried.  Patient stated she is fairly stable at this time and is okay not to change at this point.  I told her that at least she knows now what is the next steps and she was appreciative at that.

## 2021-01-31 NOTE — Progress Notes (Signed)
Cardiology Office Note:    Date:  02/01/2021   ID:  Levetta, Bognar [birth year], MRN [id number]  PCP:  Deland Pretty, MD   [facility] Providers Cardiologist:  None {   Referring MD: Deland Pretty, MD     History of Present Illness:    [name] is a 80 y.o. 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 Meda Coffee who now returns to clinic for follow-up.  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 She was treated with RAI.  Echocardiogram in 2014 normal LVEF and severe pulmonary HTN with RVSP 63 mmHg. Repeat echo in 2016 showed improvement in PA pressure to normal 25 mmHg, moderate MVP and mild mitral regurgitation. Last saw Dr. Meda Coffee in 04/2020 where she was struggling with the loss of her husband. Was doing well from a CV standpoint. Repeat TTE 04/2020 with LVEF 60-65%, mild LVH, mild MR, normal PASP, mild TR.  Today, the patient states she feels overall well. TSH has been closely followed by Endocrinology as been well controlled. No chest pain, SOB, LE edema, orthopnea or PND. Tolerating medications without issues. Blood pressure is well controlled on medications running mainly 120s/80s. Had episode of syncope secondary to orthostasis after standing up from a hot bath for which she saw Neurology. Has tried to increase hydration with no further episodes since that time.   Past Medical History:  Diagnosis Date   Abnormal EKG    Atrial fibrillation (HCC)    Basal cell carcinoma of nose    removed w/MOHs   CAD (coronary artery disease)    a. 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 EF 30-35% at [facility] 06/2013;  b.  f/u Echo (06/18/13):  EF 60-65%, normal wall motion, Gr 1 DD, mild MVP of post leaflet, mod TR, PASP 63   Thyroid disease    Tricuspid regurgitation  mild to moderate, echo, January, 2011, [place] pressure 38 mm mercury    Past Surgical History:  Procedure Laterality Date   CARDIAC CATHETERIZATION  06/17/2013   COLONOSCOPY  12/23/2003   normal (indications: prior adenomas and grandfather with colon cancer)   COLPOSCOPY     EXCISIONAL HEMORRHOIDECTOMY  2003   LEFT HEART CATHETERIZATION WITH CORONARY ANGIOGRAM N/A 06/17/2013   Procedure: LEFT HEART CATHETERIZATION WITH CORONARY ANGIOGRAM;  Surgeon: Peter M Martinique, MD;  Location: [facility] CATH LAB;  Service: Cardiovascular;  Laterality: N/A;   MOHS SURGERY Left 2011   "side of my nose" (06/17/2013)   TONSILLECTOMY     TOTAL KNEE ARTHROPLASTY  08/2010   TOTAL KNEE ARTHROPLASTY  02/25/2012   Procedure: TOTAL KNEE ARTHROPLASTY;  Surgeon: Gearlean Alf, MD;  Location: [facility] ORS;   Service: Orthopedics;  Laterality: Right;    Current Medications: Current Meds  Medication Sig   ALPHA LIPOIC ACID PO Take by mouth.   atorvastatin (LIPITOR) 10 MG tablet Take 1 tablet by mouth every day with breakfast.   B Complex Vitamins (B COMPLEX PO) Take by mouth.   Biotin 5000 MCG CAPS Take 1 capsule by mouth daily.    Calcium Carbonate (CALTRATE 600 PO) Take 1 tablet by mouth 2 (two) times a day.    Cholecalciferol 25 MCG (1000 UT) capsule Take 1,000 Units by mouth daily.   denosumab (PROLIA) 60 MG/ML SOSY injection Inject 60 mg into the skin every 6 (six) months.   diclofenac sodium (VOLTAREN) 1 % GEL 1 application.   enalapril (VASOTEC) 20 MG tablet Take 1 tablet (20 mg total) by mouth 2 (two) times daily.   EVENING PRIMROSE OIL PO Take 1 capsule by mouth 2 (two) times a day.    gabapentin (NEURONTIN) 300 MG capsule Take 3 capsules (900 mg total) by mouth 3 (three) times daily.   hydrochlorothiazide (HYDRODIURIL) 25 MG tablet Take 1 tablet (25 mg total) by mouth daily.   levothyroxine (SYNTHROID) 75 MCG tablet TAKE 1 TABLET BY MOUTH EVERY DAY   MELATONIN PO Take by mouth as needed.   meloxicam (MOBIC) 15 MG tablet Take 15 mg by mouth daily.   methylcellulose packet Take 1 each by mouth daily. CITRICEL   metroNIDAZOLE (METROCREAM) 0.75 % cream Apply 1 application topically 2 (two) times daily.   NONFORMULARY OR COMPOUNDED ITEM Compound cream: Meloxicam 0.5% Doxepin 3% Amantadine 3% Dextromethorphan 2% Lidocaine 2%  Sig: Apply 1-2 grams to the affected area 3-4 times daily. Dispense 240 grams with 5 refills.   pantoprazole (PROTONIX) 40 MG tablet Take 1 tablet by mouth daily.   tretinoin (RETIN-A) 0.05 % cream Apply 1 application topically at bedtime.   zolpidem (AMBIEN) 5 MG tablet Take 5 mg by mouth at bedtime as needed for sleep.   [DISCONTINUED] amLODipine (NORVASC) 5 MG tablet Take 1 tablet (5 mg total) by mouth daily.   [DISCONTINUED] hydrALAZINE (APRESOLINE) 50 MG  tablet Take 1 tablet (50 mg total) by mouth 2 (two) times daily. Please keep upcoming appt in June 2022 with Dr. Johney Frame before anymore refills. Thank you     Allergies:   Patient has no known allergies.   Social History   Socioeconomic History   Marital status: Widowed    Spouse name: Not on file   Number of children: 1   Years of education: Not on file   Highest education level: Not on file  Occupational History   Occupation: Homemaker  Tobacco Use   Smoking status: Never  Smokeless tobacco: Never  Vaping Use   Vaping Use: Never used  Substance and Sexual Activity   Alcohol use: Yes    Alcohol/week: 0.0 standard drinks    Comment: Rare   Drug use: No   Sexual activity: Never    Birth control/protection: Post-menopausal    Comment: 1st intercourse 80 yo-Fewer than 5 partners  Other Topics Concern   Not on file  Social History Narrative   3 caffeine drinks daily    Widowed   Retired      Lives alone   Right handed   Caffeine: 1.5 cup/day   Social Determinants of Radio broadcast assistant Strain: Not on file  Food Insecurity: Not on file  Transportation Needs: Not on file  Physical Activity: Not on file  Stress: Not on file  Social Connections: Not on file     Family History: The patient's family history includes Breast cancer in her paternal aunt; Breast cancer (age of onset: 60) in her sister; Colon cancer in her maternal grandfather; Coronary artery disease in her father; Esophageal cancer in her paternal grandmother; Heart attack (age of onset: 30) in her mother; Heart attack (age of onset: 74) in her father; Heart disease in her father, maternal grandfather, mother, paternal grandfather, and sister; Hypertension in her father, mother, and sister; Irritable bowel syndrome in her sister; Stroke in her mother. There is no history of Rectal cancer or Stomach cancer.  ROS:   Please see the history of present illness.    Review of Systems  Constitutional:   Negative for chills and fever.  HENT:  Negative for sore throat.   Eyes:  Negative for blurred vision.  Respiratory:  Negative for shortness of breath.   Cardiovascular:  Negative for chest pain, palpitations, orthopnea, claudication, leg swelling and PND.  Gastrointestinal:  Negative for nausea and vomiting.  Genitourinary:  Negative for flank pain.  Musculoskeletal:  Negative for falls.  Neurological:  Positive for dizziness and loss of consciousness.  Psychiatric/Behavioral:  Negative for substance abuse.     EKGs/Labs/Other Studies Reviewed:    The following studies were reviewed today: TTE 2020/05/11: IMPRESSIONS   1. Left ventricular ejection fraction, by estimation, is 60 to 65%. The  left ventricle has normal function. The left ventricle has no regional  wall motion abnormalities. There is mild left ventricular hypertrophy.  Left ventricular diastolic parameters  are indeterminate.   2. Right ventricular systolic function is normal. The right ventricular  size is normal. There is normal pulmonary artery systolic pressure.   3. The mitral valve is normal in structure. Mild mitral valve  regurgitation. No evidence of mitral stenosis.   4. The aortic valve is normal in structure. Aortic valve regurgitation is  not visualized. No aortic stenosis is present.   EKG:  EKG is  ordered today.  The ekg ordered today demonstrates NSR with HR 62  Recent Labs: 05/23/2020: TSH 0.79  Recent Lipid Panel    Component Value Date/Time   CHOL 160 06/03/2018 1000   TRIG 67 06/03/2018 1000   TRIG 69 05/27/2006 0928   HDL 77 06/03/2018 1000   CHOLHDL 2.1 06/03/2018 1000   CHOLHDL 2 01/13/2015 0918   VLDL 11.6 01/13/2015 0918   LDLCALC 70 06/03/2018 1000         Physical Exam:    VS:  BP 128/84   Pulse 62   Ht 5\' 4"  (1.626 m)   Wt 149 lb 6.4 oz (67.8 kg)   SpO2  97%   BMI 25.64 kg/m     Wt Readings from Last 3 Encounters:  02/01/21 149 lb 6.4 oz (67.8 kg)  12/01/20 150 lb 12.8  oz (68.4 kg)  11/24/20 151 lb (68.5 kg)     GEN:  Well nourished, well developed in no acute distress HEENT: Normal NECK: No JVD; No carotid bruits CARDIAC: RRR, no murmurs, rubs, gallops RESPIRATORY:  Clear to auscultation without rales, wheezing or rhonchi  ABDOMEN: Soft, non-tender, non-distended MUSCULOSKELETAL:  No edema; No deformity  SKIN: Warm and dry NEUROLOGIC:  Alert and oriented x 3 PSYCHIATRIC:  Normal affect   ASSESSMENT:    1. Takotsubo syndrome   2. Hyperlipidemia, unspecified hyperlipidemia type   3. Hypertension, essential   4. MVP (mitral valve prolapse)   5. Mitral valve insufficiency, unspecified etiology   6. Mixed hyperlipidemia   7. Paroxysmal atrial fibrillation (HCC)    PLAN:    In order of problems listed above:  #History of Takotsubo CM: Occurred in 2014 in the setting of Graves disease. Cath with no obstructive CAD. EF now recovered on TTE in 2021 with LVEF 60-65%. Currently NYHA class I symptoms. -Continue to monitor  #Isolated episode of Afib: Occurred in the setting of Graves disease and takotsubo as above. No recurrence. -Continue to monitor  #Graves Disease: S/p RAI treatment. -Follow-up with Endocrinology as scheduled  #MVP: Stable on TTE in 04/2020 with mild MR. -Continue serial monitoring with next TTE in 2024  #HLD: LDL 69 in 06/2020. -Continue lipitor 10mg  daily  #HTN: Well controlled and at goal <120s/80s. -Continue enalapril 20mg  daily -Continue amlodipine 5mg  daily -Continue HCTZ 25mg  daily -Continue hydralazine 50mg  BID     Medication Adjustments/Labs and Tests Ordered: Current medicines are reviewed at length with the patient today.  Concerns regarding medicines are outlined above.  Orders Placed This Encounter  Procedures   EKG 12-Lead   Meds ordered this encounter  Medications   hydrALAZINE (APRESOLINE) 50 MG tablet    Sig: Take 1 tablet (50 mg total) by mouth 2 (two) times daily.    Dispense:  180 tablet     Refill:  3   amLODipine (NORVASC) 5 MG tablet    Sig: Take 1 tablet (5 mg total) by mouth daily.    Dispense:  90 tablet    Refill:  3    Patient Instructions  Medication Instructions:   Your physician recommends that you continue on your current medications as directed. Please refer to the Current Medication list given to you today.  *If you need a refill on your cardiac medications before your next appointment, please call your pharmacy*   Follow-Up: At [facility], you and your health needs are our priority.  As part of our continuing mission to provide you with exceptional heart care, we have created designated Provider Care Teams.  These Care Teams include your primary Cardiologist (physician) and Advanced Practice Providers (APPs -  Physician Assistants and Nurse Practitioners) who all work together to provide you with the care you need, when you need it.  We recommend signing up for the patient portal called "MyChart".  Sign up information is provided on this After Visit Summary.  MyChart is used to connect with patients for Virtual Visits (Telemedicine).  Patients are able to view lab/test results, encounter notes, upcoming appointments, etc.  Non-urgent messages can be sent to your provider as well.   To learn more about what you can do with MyChart, go to [email or web address].  Your next appointment:   1 year(s)  The format for your next appointment:   In Person  Provider:   Gwyndolyn Kaufman, MD      Signed, Freada Bergeron, MD  02/01/2021 3:36 PM

## 2021-02-01 ENCOUNTER — Encounter (HOSPITAL_BASED_OUTPATIENT_CLINIC_OR_DEPARTMENT_OTHER): Payer: Self-pay | Admitting: Cardiology

## 2021-02-01 ENCOUNTER — Ambulatory Visit (HOSPITAL_BASED_OUTPATIENT_CLINIC_OR_DEPARTMENT_OTHER): Payer: Medicare Other | Admitting: Cardiology

## 2021-02-01 ENCOUNTER — Other Ambulatory Visit: Payer: Self-pay

## 2021-02-01 VITALS — BP 128/84 | HR 62 | Ht 64.0 in | Wt 149.4 lb

## 2021-02-01 DIAGNOSIS — I5181 Takotsubo syndrome: Secondary | ICD-10-CM

## 2021-02-01 DIAGNOSIS — I340 Nonrheumatic mitral (valve) insufficiency: Secondary | ICD-10-CM | POA: Diagnosis not present

## 2021-02-01 DIAGNOSIS — I341 Nonrheumatic mitral (valve) prolapse: Secondary | ICD-10-CM

## 2021-02-01 DIAGNOSIS — E785 Hyperlipidemia, unspecified: Secondary | ICD-10-CM

## 2021-02-01 DIAGNOSIS — I10 Essential (primary) hypertension: Secondary | ICD-10-CM

## 2021-02-01 DIAGNOSIS — I480 Paroxysmal atrial fibrillation: Secondary | ICD-10-CM

## 2021-02-01 DIAGNOSIS — E782 Mixed hyperlipidemia: Secondary | ICD-10-CM | POA: Diagnosis not present

## 2021-02-01 MED ORDER — HYDRALAZINE HCL 50 MG PO TABS: 50.0000 mg | ORAL_TABLET | Freq: Two times a day (BID) | ORAL | 3 refills | Status: DC

## 2021-02-01 MED ORDER — AMLODIPINE BESYLATE 5 MG PO TABS: 5.0000 mg | ORAL_TABLET | Freq: Every day | ORAL | 3 refills | Status: DC

## 2021-02-01 NOTE — Patient Instructions (Signed)

## 2021-02-02 DIAGNOSIS — M810 Age-related osteoporosis without current pathological fracture: Secondary | ICD-10-CM | POA: Diagnosis not present

## 2021-02-02 DIAGNOSIS — I10 Essential (primary) hypertension: Secondary | ICD-10-CM | POA: Diagnosis not present

## 2021-02-02 DIAGNOSIS — E039 Hypothyroidism, unspecified: Secondary | ICD-10-CM | POA: Diagnosis not present

## 2021-02-09 ENCOUNTER — Encounter: Payer: Self-pay | Admitting: Internal Medicine

## 2021-02-23 ENCOUNTER — Other Ambulatory Visit: Payer: Self-pay | Admitting: Internal Medicine

## 2021-02-23 DIAGNOSIS — M810 Age-related osteoporosis without current pathological fracture: Secondary | ICD-10-CM

## 2021-02-28 ENCOUNTER — Other Ambulatory Visit: Payer: Self-pay | Admitting: Internal Medicine

## 2021-02-28 ENCOUNTER — Encounter: Payer: Self-pay | Admitting: Internal Medicine

## 2021-02-28 NOTE — Telephone Encounter (Signed)
Please see below.

## 2021-03-01 NOTE — Telephone Encounter (Signed)
Medication was approved via Rx request from the pharmacy.

## 2021-04-04 ENCOUNTER — Ambulatory Visit: Payer: Self-pay | Admitting: Neurology

## 2021-04-05 DIAGNOSIS — I10 Essential (primary) hypertension: Secondary | ICD-10-CM | POA: Diagnosis not present

## 2021-04-05 DIAGNOSIS — E039 Hypothyroidism, unspecified: Secondary | ICD-10-CM | POA: Diagnosis not present

## 2021-04-05 DIAGNOSIS — M810 Age-related osteoporosis without current pathological fracture: Secondary | ICD-10-CM | POA: Diagnosis not present

## 2021-04-11 ENCOUNTER — Ambulatory Visit: Payer: Medicare Other | Admitting: Neurology

## 2021-04-11 ENCOUNTER — Other Ambulatory Visit: Payer: Self-pay

## 2021-04-11 DIAGNOSIS — M62472 Contracture of muscle, left ankle and foot: Secondary | ICD-10-CM | POA: Diagnosis not present

## 2021-04-11 DIAGNOSIS — R252 Cramp and spasm: Secondary | ICD-10-CM

## 2021-04-11 DIAGNOSIS — G6289 Other specified polyneuropathies: Secondary | ICD-10-CM

## 2021-04-11 DIAGNOSIS — M62471 Contracture of muscle, right ankle and foot: Secondary | ICD-10-CM | POA: Diagnosis not present

## 2021-04-11 NOTE — Progress Notes (Signed)
In the balls of the feet and instep (flexor digiotorum brevis, lumbricals)  04/11/2021: Botox works great > 60% improvement in pain and quality of life, she can feel it wearing off. 01/04/2021: > 50% improvement in pain, continue current dose 10/04/2020: first injections  History: Refractory idiopathic peripheral diabetic polyneuropathy. 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 Hx of diabetic(per-diabetic) neuropathy.   Procedure: All procedures documented were medically necessary, reasonable and appropriate based on the patient's history, medical diagnosis and physician opinion. Verbal informed consent was obtained from the patient, patient was informed of potential risk of procedure, including bruising, bleeding, hematoma formation, infection, muscle weakness, muscle pain, numbness, transient hypertension, transient hyperglycemia and transient insomnia among others. All areas injected were topically clean with isopropyl rubbing alcohol. Nonsterile nonlatex gloves were worn during the procedure.   Injections were given bilaterally to the plantar surface of the feet.  The feet were properly sterilized with evenly spaced sites bilaterally injected each with 10 units of botox using a 1 ml 30-gauge needle.  200 units of Botox were used and none was wasted.  I spent over 40 minutes of face-to-face and non-face-to-face time with patient on the  1. Cramp and spasm   2. Other polyneuropathy    diagnosis.  This included previsit chart review, lab review, study review, order entry, electronic health record documentation, patient education on the different diagnostic and therapeutic options, counseling and coordination of care, risks and benefits of management, compliance, or risk factor reduction

## 2021-04-11 NOTE — Progress Notes (Addendum)
Botox- 200 units x 1 vial Lot: [id number] Expiration: 09/2023 NDC: [id number]   Bacteriostatic 0.9% Sodium Chloride- 64m total Lot: [id number]: 01/04/2022 NDC: [id number]  Dx: R25.2 cramp and spasm, G62.89 other polyneuropathy. M62.471 Contracture of muscle, right ankle and foot. M62.472 contracture of muscle, left ankle and foot.   sample

## 2021-04-24 DIAGNOSIS — M150 Primary generalized (osteo)arthritis: Secondary | ICD-10-CM | POA: Diagnosis not present

## 2021-04-24 DIAGNOSIS — M810 Age-related osteoporosis without current pathological fracture: Secondary | ICD-10-CM | POA: Diagnosis not present

## 2021-04-24 DIAGNOSIS — M65311 Trigger thumb, right thumb: Secondary | ICD-10-CM | POA: Diagnosis not present

## 2021-04-24 DIAGNOSIS — M5136 Other intervertebral disc degeneration, lumbar region: Secondary | ICD-10-CM | POA: Diagnosis not present

## 2021-04-24 DIAGNOSIS — M353 Polymyalgia rheumatica: Secondary | ICD-10-CM | POA: Diagnosis not present

## 2021-04-27 DIAGNOSIS — M810 Age-related osteoporosis without current pathological fracture: Secondary | ICD-10-CM | POA: Diagnosis not present

## 2021-05-05 DIAGNOSIS — E039 Hypothyroidism, unspecified: Secondary | ICD-10-CM | POA: Diagnosis not present

## 2021-05-05 DIAGNOSIS — M810 Age-related osteoporosis without current pathological fracture: Secondary | ICD-10-CM | POA: Diagnosis not present

## 2021-05-05 DIAGNOSIS — I10 Essential (primary) hypertension: Secondary | ICD-10-CM | POA: Diagnosis not present

## 2021-05-09 ENCOUNTER — Telehealth: Payer: Self-pay | Admitting: Internal Medicine

## 2021-05-11 DIAGNOSIS — J301 Allergic rhinitis due to pollen: Secondary | ICD-10-CM | POA: Diagnosis not present

## 2021-05-11 DIAGNOSIS — H5789 Other specified disorders of eye and adnexa: Secondary | ICD-10-CM | POA: Diagnosis not present

## 2021-05-11 MED ORDER — LEVOTHYROXINE SODIUM 75 MCG PO TABS: 75.0000 ug | ORAL_TABLET | Freq: Every day | ORAL | 0 refills | Status: DC

## 2021-05-11 MED ORDER — LEVOTHYROXINE SODIUM 75 MCG PO TABS: 75.0000 ug | ORAL_TABLET | Freq: Every day | ORAL | 2 refills | Status: DC

## 2021-05-11 NOTE — Addendum Note (Signed)
Addended by: Cinda Quest on: 05/11/2021 05:04 PM   Modules accepted: Orders

## 2021-05-11 NOTE — Addendum Note (Signed)
Addended by: Cinda Quest on: 05/11/2021 05:07 PM   Modules accepted: Orders

## 2021-05-11 NOTE — Telephone Encounter (Signed)
Patient called to check on refill request from 05/09/21 Levothyroxine - patient has appointment on 05/16/21, but does advise she needs a refill until - advises that her copay on the 90 day fill is $0..  Call back # [phone]

## 2021-05-15 ENCOUNTER — Other Ambulatory Visit: Payer: Self-pay | Admitting: *Deleted

## 2021-05-15 DIAGNOSIS — Z85828 Personal history of other malignant neoplasm of skin: Secondary | ICD-10-CM | POA: Diagnosis not present

## 2021-05-15 DIAGNOSIS — L820 Inflamed seborrheic keratosis: Secondary | ICD-10-CM | POA: Diagnosis not present

## 2021-05-15 DIAGNOSIS — L720 Epidermal cyst: Secondary | ICD-10-CM | POA: Diagnosis not present

## 2021-05-15 DIAGNOSIS — L738 Other specified follicular disorders: Secondary | ICD-10-CM | POA: Diagnosis not present

## 2021-05-15 DIAGNOSIS — D1801 Hemangioma of skin and subcutaneous tissue: Secondary | ICD-10-CM | POA: Diagnosis not present

## 2021-05-15 DIAGNOSIS — I480 Paroxysmal atrial fibrillation: Secondary | ICD-10-CM

## 2021-05-15 DIAGNOSIS — L918 Other hypertrophic disorders of the skin: Secondary | ICD-10-CM | POA: Diagnosis not present

## 2021-05-15 DIAGNOSIS — I5181 Takotsubo syndrome: Secondary | ICD-10-CM

## 2021-05-15 MED ORDER — HYDROCHLOROTHIAZIDE 25 MG PO TABS: 25.0000 mg | ORAL_TABLET | Freq: Every day | ORAL | 1 refills | Status: DC

## 2021-05-15 MED ORDER — ATORVASTATIN CALCIUM 10 MG PO TABS: ORAL_TABLET | ORAL | 1 refills | Status: DC

## 2021-05-16 ENCOUNTER — Ambulatory Visit (INDEPENDENT_AMBULATORY_CARE_PROVIDER_SITE_OTHER): Payer: Medicare Other | Admitting: Internal Medicine

## 2021-05-16 ENCOUNTER — Other Ambulatory Visit: Payer: Self-pay

## 2021-05-16 ENCOUNTER — Encounter: Payer: Self-pay | Admitting: Internal Medicine

## 2021-05-16 VITALS — BP 142/88 | HR 66 | Ht 64.0 in | Wt 150.8 lb

## 2021-05-16 DIAGNOSIS — Z8639 Personal history of other endocrine, nutritional and metabolic disease: Secondary | ICD-10-CM | POA: Diagnosis not present

## 2021-05-16 DIAGNOSIS — E890 Postprocedural hypothyroidism: Secondary | ICD-10-CM | POA: Diagnosis not present

## 2021-05-16 LAB — TSH: TSH: 1.77 u[IU]/mL (ref 0.35–5.50)

## 2021-05-16 LAB — T4, FREE: Free T4: 1.03 ng/dL (ref 0.60–1.60)

## 2021-05-16 MED ORDER — LEVOTHYROXINE SODIUM 75 MCG PO TABS: 75.0000 ug | ORAL_TABLET | Freq: Every day | ORAL | 3 refills | Status: DC

## 2021-05-16 NOTE — Patient Instructions (Addendum)
Please continue Levothyroxine 75 mcg daily.  Take the thyroid hormone every day, with water, at least 30 minutes before breakfast, separated by at least 4 hours from: - acid reflux medications - calcium - iron - multivitamins  Please stop at the lab.

## 2021-05-16 NOTE — Progress Notes (Signed)
Patient ID: [name], female   DOB: [birth year], 80 y.o.   MRN: [id number]  This visit occurred during the SARS-CoV-2 public health emergency.  Safety protocols were in place, including screening questions prior to the visit, additional usage of staff PPE, and extensive cleaning of exam room while observing appropriate contact time as indicated for disinfecting solutions.   HPI  [name] is a 80 y.o.-year-old female, returning for f/u for h/o Graves ds and now postablative hypothyroidism. Last visit 1 year ago.  Interim history: At this visit, she feels well, without complaints. She does have dry eyes, which is chronic for her.  Reviewed history: Patient was admitted to the hospital 06/2013 with Takotsubo cardiomyopathy, and a low TSH was found incidentally. She had cardiac catheterization on 06/17/2013 (after blood for TSH was drawn). She was also found to have atrial fibrillation in the hospital, now resolved (was on Coreg in the past and continues now). The patient's EF also normalized before discharge.  She was scheduled to have a hospital followup appointment with her PCP. Dr. Regis Bill repeated a TSH level and added a free T4 and a free T3 and these returned abnormal, suggesting thyrotoxicosis. Of note, her pulse was in the 50s and she was not in atrial fibrillation at the time of the appointment. Patient was referred to endocrinology for for further management.  Thyroid Uptake and scan (12/28/2013): Markedly elevated 24 hr radio iodine uptake of 82% Normal thyroid scan. Findings consistent with Graves disease.  Pt was initially on MMI, then had RAI Tx 01/26/2014 >> developed post ablative hypothyroidism  Pt is currently on levothyroxine 75 mcg daily, taken: - ~4:30 AM - fasting - coffee + cream 30 min later - at least 30 min from b'fast - no Fe, MVI - + Protonix  - at lunch - + Calcium with lunch and dinner - + Biotin 5000 mcg daily - stopped 10 days ago - + B  complex  - inconsistently  Reviewed her TFTs: Lab Results  Component Value Date   TSH 0.79 05/23/2020   TSH 1.47 11/10/2019   TSH 8.69 (H) 09/29/2019   TSH 3.88 06/29/2019   TSH 0.27 (L) 05/18/2019   TSH 0.49 11/20/2018   TSH 0.53 05/22/2018   TSH 1.08 11/18/2017   TSH 3.110 04/09/2017   TSH 1.38 02/15/2016   FREET4 0.98 05/23/2020   FREET4 1.02 11/10/2019   FREET4 0.86 09/29/2019   FREET4 0.86 06/29/2019   FREET4 1.39 05/18/2019   FREET4 1.22 11/20/2018   FREET4 1.20 05/22/2018   FREET4 1.02 11/18/2017   FREET4 1.02 08/22/2015   FREET4 1.66 (H) 05/23/2015  fT3 on 06/26/2013: 6.4 (2.3 - 4.2)   TSI's are elevated: Lab Results  Component Value Date   TSI 472 (H) 05/18/2019    Pt denies: - feeling nodules in neck - hoarseness - dysphagia - choking - SOB with lying down  She also has a h/o HTN, HL, tricuspid regurgitation, mitral valve prolapse, PMR.  She had cataract sx. 06/2018.  She has dry eyes. She sees Dr. Ellie Lunch.  She occasionally uses steroid drops.  She is off carvedilol due to fatigue and bradycardia.  She has a h/o L4 burst compression fx >> Gabapentin.  She continues on Prolia.  She tolerates this well.  This is managed in [place].  ROS: + See HPI  I reviewed pt's medications, allergies, PMH, social hx, family hx, and changes were documented in the history of present illness. Otherwise, unchanged from my  initial visit note.  Past Medical History:  Diagnosis Date   Abnormal EKG    Atrial fibrillation (HCC)    Basal cell carcinoma of nose    removed w/MOHs   CAD (coronary artery disease)    a. 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 EF 30-35% at [facility] 06/2013;  b.  f/u Echo (06/18/13):  EF 60-65%, normal wall motion, Gr 1 DD, mild MVP of post leaflet, mod TR, PASP 63   Thyroid disease    Tricuspid regurgitation    mild to moderate, echo, January, 2011, PA pressure 38 mm mercury   Past Surgical History:  Procedure Laterality Date   CARDIAC CATHETERIZATION  06/17/2013   COLONOSCOPY  12/23/2003   normal (indications: prior adenomas and grandfather with colon cancer)   COLPOSCOPY     EXCISIONAL HEMORRHOIDECTOMY  2003   LEFT HEART CATHETERIZATION WITH CORONARY ANGIOGRAM N/A 06/17/2013   Procedure: LEFT HEART CATHETERIZATION WITH CORONARY ANGIOGRAM;  Surgeon: Peter M Martinique, MD;  Location: [facility] CATH LAB;  Service: Cardiovascular;  Laterality: N/A;   MOHS SURGERY Left 2011   "side of my nose" (06/17/2013)   TONSILLECTOMY     TOTAL KNEE ARTHROPLASTY  08/2010   TOTAL KNEE ARTHROPLASTY  02/25/2012   Procedure: TOTAL KNEE ARTHROPLASTY;  Surgeon: Gearlean Alf, MD;  Location: [facility] ORS;  Service: Orthopedics;  Laterality: Right;   Social History   Socioeconomic History   Marital  status: Widowed    Spouse name: Not on file   Number of children: 1   Years of education: Not on file   Highest education level: Not on file  Occupational History   Occupation: Homemaker  Tobacco Use   Smoking status: Never   Smokeless tobacco: Never  Vaping Use   Vaping Use: Never used  Substance and Sexual Activity   Alcohol use: Yes    Alcohol/week: 0.0 standard drinks    Comment: Rare   Drug use: No   Sexual activity: Never    Birth control/protection: Post-menopausal    Comment: 1st intercourse 80 yo-Fewer than 5  partners  Other Topics Concern   Not on file  Social History Narrative   3 caffeine drinks daily    Widowed   Retired      Lives alone   Right handed   Caffeine: 1.5 cup/day   Social Determinants of Radio broadcast assistant Strain: Not on file  Food Insecurity: Not on file  Transportation Needs: Not on file  Physical Activity: Not on file  Stress: Not on file  Social Connections: Not on file  Intimate Partner Violence: Not on file   Current Outpatient Medications on File Prior to Visit  Medication Sig Dispense Refill   ALPHA LIPOIC ACID PO Take by mouth.     amLODipine (NORVASC) 5 MG tablet Take 1 tablet (5 mg total) by mouth daily. 90 tablet 3   atorvastatin (LIPITOR) 10 MG tablet Take 1 tablet by mouth every day with breakfast. 90 tablet 1   B Complex Vitamins (B COMPLEX PO) Take by mouth.     Biotin 5000 MCG CAPS Take 1 capsule by mouth daily.      Calcium Carbonate (CALTRATE 600 PO) Take 1 tablet by mouth 2 (two) times a day.      Cholecalciferol 25 MCG (1000 UT) capsule Take 1,000 Units by mouth daily.     denosumab (PROLIA) 60 MG/ML SOSY injection Inject 60 mg into the skin every 6 (six) months.     diclofenac sodium (VOLTAREN) 1 % GEL 1 application.  2   enalapril (VASOTEC) 20 MG tablet Take 1 tablet (20 mg total) by mouth 2 (two) times daily. 180 tablet 2   EVENING PRIMROSE OIL PO Take 1 capsule by mouth 2 (two) times a day.       gabapentin (NEURONTIN) 300 MG capsule Take 3 capsules (900 mg total) by mouth 3 (three) times daily. 270 capsule 11   hydrALAZINE (APRESOLINE) 50 MG tablet Take 1 tablet (50 mg total) by mouth 2 (two) times daily. 180 tablet 3   hydrochlorothiazide (HYDRODIURIL) 25 MG tablet Take 1 tablet (25 mg total) by mouth daily. 90 tablet 1   levothyroxine (SYNTHROID) 75 MCG tablet Take 1 tablet (75 mcg total) by mouth daily. 90 tablet 0   MELATONIN PO Take by mouth as needed.     meloxicam (MOBIC) 15 MG tablet Take 15 mg by mouth daily.     methylcellulose packet Take 1 each by mouth daily. CITRICEL     metroNIDAZOLE (METROCREAM) 0.75 % cream Apply 1 application topically 2 (two) times daily.     NONFORMULARY OR COMPOUNDED ITEM Compound cream: Meloxicam 0.5% Doxepin 3% Amantadine 3% Dextromethorphan 2% Lidocaine 2%  Sig: Apply 1-2 grams to the affected area 3-4 times daily. Dispense 240 grams with 5 refills.     pantoprazole (PROTONIX) 40 MG tablet Take 1 tablet by mouth daily.     tretinoin (RETIN-A) 0.05 % cream Apply 1 application topically at bedtime.     zolpidem (AMBIEN) 5 MG tablet Take 5 mg by mouth at bedtime as needed for sleep.     No current facility-administered medications on file prior to visit.   No Known Allergies Family History  Problem Relation Age of Onset   Heart attack Mother 51   Hypertension Mother    Heart disease Mother    Stroke Mother    Heart attack Father 10   Coronary artery disease Father        Multiple Family Members   Hypertension Father    Heart disease Father  Esophageal cancer Paternal Grandmother    Colon cancer Maternal Grandfather    Heart disease Maternal Grandfather    Irritable bowel syndrome Sister        More family members on father side of    Hypertension Sister    Heart disease Sister    Breast cancer Sister 79   Breast cancer Paternal Aunt        Age 95's   Heart disease Paternal Grandfather    Rectal cancer Neg Hx    Stomach  cancer Neg Hx     PE: BP (!) 142/88 (BP Location: Right Arm, Patient Position: Sitting, Cuff Size: Normal)   Pulse 66   Ht 5\' 4"  (1.626 m)   Wt 150 lb 12.8 oz (68.4 kg)   SpO2 95%   BMI 25.88 kg/m  Body mass index is 25.88 kg/m. Wt Readings from Last 3 Encounters:  05/16/21 150 lb 12.8 oz (68.4 kg)  02/01/21 149 lb 6.4 oz (67.8 kg)  12/01/20 150 lb 12.8 oz (68.4 kg)   Constitutional: normal weight, in NAD Eyes: PERRLA, EOMI, no exophthalmos ENT: moist mucous membranes, no thyromegaly, no cervical lymphadenopathy Cardiovascular: RRR, No MRG Respiratory: CTA B Gastrointestinal: abdomen soft, NT, ND, BS+ Musculoskeletal: no deformities, strength intact in all 4 Skin: moist, warm, no rashes Neurological: no tremor with outstretched hands, DTR not elicited in LE extremities due to bilateral TKR  ASSESSMENT: 1. H/o Graves ds  2. Postablative hypothyroidism  3. S/p L4 fracture - She fell backwards >> L4 compression fracture.  - She was on Reclast >> this year was her fourth drug holiday year. - Managed by Dr. Marcelino Scot in [place] - Discussed pros and cons of Prolia >> started it and continues on this  PLAN:  1. Patient with history of Graves' disease, status post RAI treatment, now with post ablative hypothyroidism -No signs of active Graves' ophthalmopathy: No exophthalmos, chemosis, double vision, eye pain, but she does have dry eyes, which is a chronic condition for her -Her antibodies were still elevated at last check -I do not feel we absolutely need to repeat them today.  2. Postablative hypothyroidism - latest thyroid labs reviewed with pt. >> normal: Lab Results  Component Value Date   TSH 0.79 05/23/2020  - she continues on LT4 75 mcg daily - pt feels good on this dose. - we discussed about taking the thyroid hormone every day, with water, >30 minutes before breakfast, separated by >4 hours from acid reflux medications, calcium, iron, multivitamins. Pt. is taking  it correctly. - will check thyroid tests today: TSH and fT4 - If labs are abnormal, she will need to return for repeat TFTs in 1.5 months  Will need refills.  Component     Latest Ref Rng & Units 05/16/2021  TSH     0.35 - 5.50 uIU/mL 1.77  T4,Free(Direct)     0.60 - 1.60 ng/dL 1.03  Normal TFTs.  Philemon Kingdom, MD PhD [facility]

## 2021-05-22 DIAGNOSIS — H01111 Allergic dermatitis of right upper eyelid: Secondary | ICD-10-CM | POA: Diagnosis not present

## 2021-06-01 DIAGNOSIS — M65311 Trigger thumb, right thumb: Secondary | ICD-10-CM | POA: Diagnosis not present

## 2021-06-01 DIAGNOSIS — I739 Peripheral vascular disease, unspecified: Secondary | ICD-10-CM | POA: Diagnosis not present

## 2021-06-05 DIAGNOSIS — M810 Age-related osteoporosis without current pathological fracture: Secondary | ICD-10-CM | POA: Diagnosis not present

## 2021-06-05 DIAGNOSIS — E039 Hypothyroidism, unspecified: Secondary | ICD-10-CM | POA: Diagnosis not present

## 2021-06-05 DIAGNOSIS — I10 Essential (primary) hypertension: Secondary | ICD-10-CM | POA: Diagnosis not present

## 2021-06-23 ENCOUNTER — Other Ambulatory Visit: Payer: Self-pay | Admitting: *Deleted

## 2021-06-23 MED ORDER — ENALAPRIL MALEATE 20 MG PO TABS: 20.0000 mg | ORAL_TABLET | Freq: Two times a day (BID) | ORAL | 2 refills | Status: DC

## 2021-07-03 ENCOUNTER — Ambulatory Visit: Payer: Medicare Other | Admitting: Neurology

## 2021-07-03 ENCOUNTER — Telehealth: Payer: Self-pay | Admitting: Neurology

## 2021-07-03 NOTE — Telephone Encounter (Signed)
I see pt has been rescheduled to 12/8 at 4 pm.

## 2021-07-03 NOTE — Telephone Encounter (Signed)
I need to reschedule my 3pm appointment today. Would you call and see how she is doing and see how far out we can change her? If she is doing well maybe we can push her out 2-3 weeks. If not we could squeeze her in dec 5th or dec 8th at 4pm. Or I may open Dec 14th for selected patients if she can wait that long.  I may also have to open up Dec 14th to reschedule some people, I was going to take that day off but I may have to put some patients there.

## 2021-07-04 NOTE — Telephone Encounter (Signed)
Submitted PA request to [facility] Medicare via [place] portal for 200 units of Botox for polyneuropathy (G62.89). Request is pending. Pending #[id number].

## 2021-07-05 DIAGNOSIS — E039 Hypothyroidism, unspecified: Secondary | ICD-10-CM | POA: Diagnosis not present

## 2021-07-05 DIAGNOSIS — M810 Age-related osteoporosis without current pathological fracture: Secondary | ICD-10-CM | POA: Diagnosis not present

## 2021-07-05 DIAGNOSIS — I10 Essential (primary) hypertension: Secondary | ICD-10-CM | POA: Diagnosis not present

## 2021-07-05 NOTE — Telephone Encounter (Signed)
Received approval from [facility] Medicare. PA #[id number] (07/04/21- 07/04/22).

## 2021-07-11 ENCOUNTER — Ambulatory Visit: Payer: Medicare Other | Admitting: Neurology

## 2021-07-13 ENCOUNTER — Other Ambulatory Visit: Payer: Self-pay

## 2021-07-13 ENCOUNTER — Ambulatory Visit: Payer: Medicare Other | Admitting: Neurology

## 2021-07-13 DIAGNOSIS — M810 Age-related osteoporosis without current pathological fracture: Secondary | ICD-10-CM | POA: Diagnosis not present

## 2021-07-13 DIAGNOSIS — M62472 Contracture of muscle, left ankle and foot: Secondary | ICD-10-CM

## 2021-07-13 DIAGNOSIS — M62471 Contracture of muscle, right ankle and foot: Secondary | ICD-10-CM

## 2021-07-13 DIAGNOSIS — G6289 Other specified polyneuropathies: Secondary | ICD-10-CM

## 2021-07-13 DIAGNOSIS — R252 Cramp and spasm: Secondary | ICD-10-CM

## 2021-07-13 DIAGNOSIS — E039 Hypothyroidism, unspecified: Secondary | ICD-10-CM | POA: Diagnosis not present

## 2021-07-13 DIAGNOSIS — I10 Essential (primary) hypertension: Secondary | ICD-10-CM | POA: Diagnosis not present

## 2021-07-13 NOTE — Progress Notes (Signed)
In the balls of the feet and instep (flexor digiotorum brevis, all lumbricals)  07/16/2021: unclear how much improvement. We will stop botox for now and see if she reports increased pain when it wears off 04/11/2021: Botox works great > 60% improvement in pain and quality of life, she can feel it wearing off. 01/04/2021: > 50% improvement in pain, continue current dose 10/04/2020: first injections  History: Refractory idiopathic peripheral diabetic polyneuropathy. 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 Hx of diabetic(per-diabetic) neuropathy.   Procedure: All procedures documented were medically necessary, reasonable and appropriate based on the patient's history, medical diagnosis and physician opinion. Verbal informed consent was obtained from the patient, patient was informed of potential risk of procedure, including bruising, bleeding, hematoma formation, infection, muscle weakness, muscle pain, numbness, transient hypertension, transient hyperglycemia and transient insomnia among others. All areas injected were topically clean with isopropyl rubbing alcohol. Nonsterile nonlatex gloves were worn during the procedure.   Injections were given bilaterally to the plantar surface of the feet.  The feet were properly sterilized with evenly spaced sites bilaterally injected each with 10 units of botox using a 1 ml 30-gauge needle.  200 units of Botox were used and none was wasted.  Dx: G62.89 B/B 14388 CPT code and [phone] for additional limb

## 2021-07-13 NOTE — Progress Notes (Signed)
Botox- 100 units x 2 vials Lot: [id number] Expiration: 09/2023 NDC: 5697-9480-16  Bacteriostatic 0.9% Sodium Chloride- 93mL total Lot: [id number] Expiration: 08/06/2022 NDC: 2707-8675-44  Dx: B20.10 B/B 07121 CPT code and 97588 for additional limb

## 2021-07-18 DIAGNOSIS — I2510 Atherosclerotic heart disease of native coronary artery without angina pectoris: Secondary | ICD-10-CM | POA: Diagnosis not present

## 2021-07-18 DIAGNOSIS — I739 Peripheral vascular disease, unspecified: Secondary | ICD-10-CM | POA: Diagnosis not present

## 2021-07-18 DIAGNOSIS — N1831 Chronic kidney disease, stage 3a: Secondary | ICD-10-CM | POA: Diagnosis not present

## 2021-07-18 DIAGNOSIS — M810 Age-related osteoporosis without current pathological fracture: Secondary | ICD-10-CM | POA: Diagnosis not present

## 2021-07-18 DIAGNOSIS — E890 Postprocedural hypothyroidism: Secondary | ICD-10-CM | POA: Diagnosis not present

## 2021-07-18 DIAGNOSIS — I10 Essential (primary) hypertension: Secondary | ICD-10-CM | POA: Diagnosis not present

## 2021-07-18 DIAGNOSIS — Z0000 Encounter for general adult medical examination without abnormal findings: Secondary | ICD-10-CM | POA: Diagnosis not present

## 2021-07-18 DIAGNOSIS — Z8679 Personal history of other diseases of the circulatory system: Secondary | ICD-10-CM | POA: Diagnosis not present

## 2021-07-18 DIAGNOSIS — G629 Polyneuropathy, unspecified: Secondary | ICD-10-CM | POA: Diagnosis not present

## 2021-07-19 ENCOUNTER — Encounter: Payer: Self-pay | Admitting: Cardiology

## 2021-07-20 ENCOUNTER — Other Ambulatory Visit: Payer: Self-pay | Admitting: Cardiology

## 2021-07-20 MED ORDER — ATORVASTATIN CALCIUM 20 MG PO TABS: 20.0000 mg | ORAL_TABLET | Freq: Every day | ORAL | 3 refills | Status: DC

## 2021-08-09 DIAGNOSIS — M545 Low back pain, unspecified: Secondary | ICD-10-CM | POA: Diagnosis not present

## 2021-08-10 ENCOUNTER — Other Ambulatory Visit: Payer: Self-pay

## 2021-08-10 ENCOUNTER — Ambulatory Visit (INDEPENDENT_AMBULATORY_CARE_PROVIDER_SITE_OTHER): Payer: PPO | Admitting: Family Medicine

## 2021-08-10 VITALS — BP 128/84 | HR 75 | Ht 64.0 in | Wt 153.0 lb

## 2021-08-10 DIAGNOSIS — M545 Low back pain, unspecified: Secondary | ICD-10-CM | POA: Diagnosis not present

## 2021-08-10 DIAGNOSIS — S3210XA Unspecified fracture of sacrum, initial encounter for closed fracture: Secondary | ICD-10-CM | POA: Diagnosis not present

## 2021-08-10 NOTE — Patient Instructions (Addendum)
Thank you for coming in today.   You should hear from MRI scheduling within 1 week. If you do not hear please let me know.    I strongly recommend using a walker  Recheck after MRI

## 2021-08-10 NOTE — Progress Notes (Signed)
° °  I, [name], LAT, ATC acting as a scribe for Alexis Leader, MD.  Subjective:    CC: LBP  HPI: Pt is an 81 y/o female c/o LBP ongoing since Saturday, 12/31. 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 Pt has a hx of L4 vertebral compression fracture and chronic LBP that occurred in 2018 from a fall. Pt locates pain to the L-side of her low back.  Radiating pain: yes- sometimes into R ant thigh.  No acute worsening radiating pain or weakness to lower extremities. LE numbness/tingling: no LE weakness: no Aggravates: lifting stuff, walking, moving, rolling over in bed Treatments tried: rest, prednisone, Meloxicam, Tylenol, tramadol,   Dx imaging: 01/12/18 L-spine MRI  04/24/17 L-spine MRI  Pertinent review of Systems: No fevers or chills  Relevant historical information: Osteoporosis on Prolia   Objective:    Vitals:   08/10/21 1509  BP: 128/84  Pulse: 75  SpO2: 97%   General: Well Developed, well nourished, and in no acute distress.   MSK: L-spine: Nontender midline.  Tender palpation left lumbar paraspinal musculature and left SI joint region. Decreased lumbar motion to extension rotation and flexion. Lower extremity strength is intact.  Lab and Radiology Results  X-ray images L-spine and sacrum obtained yesterday provided on CD by patient personally and independently interpreted.  L-spine: Scoliosis L-spine convex left.  Significant degenerative changes with compression deformity at L4.  No acute fractures.  Sacrum: Hard to appreciate significant acute fracture in sacrum.  Sclerosis present in lateral sacrum could represent fracture.    Impression and Recommendations:    Assessment and Plan: 81 y.o. female with acute left low back pain without an acute injury or fall.  Patient has radiology report of sacrum fracture on x-ray.  It is possible this may be an old fracture that is seen for the first time on x-ray and that  her pain today is due to muscle spasm and dysfunction.  However I do think that a sacral insufficiency fracture that is new is the most likely cause of her pain.  Plan for MRI sacrum to further evaluate the acuity of the fracture to help determine future treatments.  For now recommend use of walker to help offload the sacrum.  She has been prescribed tramadol by her PCP use this with Tylenol and recheck after MRI.Marland Kitchen  PDMP not reviewed this encounter. Orders Placed This Encounter  Procedures   MR SACRUM SI JOINTS WO CONTRAST    Standing Status:   Future    Standing Expiration Date:   08/10/2022    Order Specific Question:   What is the patient's sedation requirement?    Answer:   No Sedation    Order Specific Question:   Does the patient have a pacemaker or implanted devices?    Answer:   No    Order Specific Question:   Preferred imaging location?    Answer:   Product/process development scientist (table limit-350lbs)   No orders of the defined types were placed in this encounter.   Discussed warning signs or symptoms. Please see discharge instructions. Patient expresses understanding.   The above documentation has been reviewed and is accurate and complete [name], M.D.

## 2021-08-13 ENCOUNTER — Ambulatory Visit (INDEPENDENT_AMBULATORY_CARE_PROVIDER_SITE_OTHER): Payer: PPO

## 2021-08-13 ENCOUNTER — Other Ambulatory Visit: Payer: Self-pay

## 2021-08-13 DIAGNOSIS — M545 Low back pain, unspecified: Secondary | ICD-10-CM

## 2021-08-13 DIAGNOSIS — M5137 Other intervertebral disc degeneration, lumbosacral region: Secondary | ICD-10-CM | POA: Diagnosis not present

## 2021-08-13 DIAGNOSIS — M5136 Other intervertebral disc degeneration, lumbar region: Secondary | ICD-10-CM | POA: Diagnosis not present

## 2021-08-13 DIAGNOSIS — S3210XA Unspecified fracture of sacrum, initial encounter for closed fracture: Secondary | ICD-10-CM

## 2021-08-14 ENCOUNTER — Other Ambulatory Visit: Payer: Medicare Other

## 2021-08-14 ENCOUNTER — Telehealth: Payer: Self-pay

## 2021-08-14 ENCOUNTER — Encounter: Payer: Self-pay | Admitting: Family Medicine

## 2021-08-14 DIAGNOSIS — M545 Low back pain, unspecified: Secondary | ICD-10-CM

## 2021-08-14 MED ORDER — TIZANIDINE HCL 2 MG PO TABS: 2.0000 mg | ORAL_TABLET | Freq: Three times a day (TID) | ORAL | 0 refills | Status: DC | PRN

## 2021-08-14 NOTE — Progress Notes (Signed)
MRI shows no sacrum fracture.  It does show some arthritis at the base of the spine.  I think the source of your pain is muscle spasm and dysfunction. Plan for physical therapy.  Where would you like me to place the referral?

## 2021-08-14 NOTE — Telephone Encounter (Signed)
Patient called wondering if she could get HEP exercises sent to her through mychart that she can at least start till she can get in with [facility].

## 2021-08-15 NOTE — Telephone Encounter (Signed)
Already spoke to this patient in length about her PT referral. Patient was confused about some healing hands referral from a whole other provider we have nothing to do with. I had told patient that I would be faxing the PT referral to [facility] today which was done and gave her the number to call if she had not heard from them at the end of the week. Patient was in understanding yesterday when I was on the phone with her about what was going to happen and all she wanted was some stretches to do till she could get in with PT.

## 2021-08-16 DIAGNOSIS — M25652 Stiffness of left hip, not elsewhere classified: Secondary | ICD-10-CM | POA: Diagnosis not present

## 2021-08-16 DIAGNOSIS — M25651 Stiffness of right hip, not elsewhere classified: Secondary | ICD-10-CM | POA: Diagnosis not present

## 2021-08-16 DIAGNOSIS — M5187 Other intervertebral disc disorders, lumbosacral region: Secondary | ICD-10-CM | POA: Diagnosis not present

## 2021-08-16 DIAGNOSIS — M5459 Other low back pain: Secondary | ICD-10-CM | POA: Diagnosis not present

## 2021-08-21 DIAGNOSIS — M5459 Other low back pain: Secondary | ICD-10-CM | POA: Diagnosis not present

## 2021-08-21 DIAGNOSIS — M25651 Stiffness of right hip, not elsewhere classified: Secondary | ICD-10-CM | POA: Diagnosis not present

## 2021-08-21 DIAGNOSIS — M25652 Stiffness of left hip, not elsewhere classified: Secondary | ICD-10-CM | POA: Diagnosis not present

## 2021-08-21 DIAGNOSIS — M5187 Other intervertebral disc disorders, lumbosacral region: Secondary | ICD-10-CM | POA: Diagnosis not present

## 2021-08-23 DIAGNOSIS — M25651 Stiffness of right hip, not elsewhere classified: Secondary | ICD-10-CM | POA: Diagnosis not present

## 2021-08-23 DIAGNOSIS — M5187 Other intervertebral disc disorders, lumbosacral region: Secondary | ICD-10-CM | POA: Diagnosis not present

## 2021-08-23 DIAGNOSIS — M25652 Stiffness of left hip, not elsewhere classified: Secondary | ICD-10-CM | POA: Diagnosis not present

## 2021-08-23 DIAGNOSIS — M5459 Other low back pain: Secondary | ICD-10-CM | POA: Diagnosis not present

## 2021-08-25 DIAGNOSIS — M5187 Other intervertebral disc disorders, lumbosacral region: Secondary | ICD-10-CM | POA: Diagnosis not present

## 2021-08-25 DIAGNOSIS — M25651 Stiffness of right hip, not elsewhere classified: Secondary | ICD-10-CM | POA: Diagnosis not present

## 2021-08-25 DIAGNOSIS — M25652 Stiffness of left hip, not elsewhere classified: Secondary | ICD-10-CM | POA: Diagnosis not present

## 2021-08-25 DIAGNOSIS — M5459 Other low back pain: Secondary | ICD-10-CM | POA: Diagnosis not present

## 2021-08-28 DIAGNOSIS — M5187 Other intervertebral disc disorders, lumbosacral region: Secondary | ICD-10-CM | POA: Diagnosis not present

## 2021-08-28 DIAGNOSIS — M25652 Stiffness of left hip, not elsewhere classified: Secondary | ICD-10-CM | POA: Diagnosis not present

## 2021-08-28 DIAGNOSIS — M5459 Other low back pain: Secondary | ICD-10-CM | POA: Diagnosis not present

## 2021-08-28 DIAGNOSIS — M25651 Stiffness of right hip, not elsewhere classified: Secondary | ICD-10-CM | POA: Diagnosis not present

## 2021-09-04 DIAGNOSIS — M5459 Other low back pain: Secondary | ICD-10-CM | POA: Diagnosis not present

## 2021-09-04 DIAGNOSIS — M25651 Stiffness of right hip, not elsewhere classified: Secondary | ICD-10-CM | POA: Diagnosis not present

## 2021-09-04 DIAGNOSIS — M25652 Stiffness of left hip, not elsewhere classified: Secondary | ICD-10-CM | POA: Diagnosis not present

## 2021-09-04 DIAGNOSIS — M5187 Other intervertebral disc disorders, lumbosacral region: Secondary | ICD-10-CM | POA: Diagnosis not present

## 2021-09-05 DIAGNOSIS — M810 Age-related osteoporosis without current pathological fracture: Secondary | ICD-10-CM | POA: Diagnosis not present

## 2021-09-05 DIAGNOSIS — H52203 Unspecified astigmatism, bilateral: Secondary | ICD-10-CM | POA: Diagnosis not present

## 2021-09-05 DIAGNOSIS — E039 Hypothyroidism, unspecified: Secondary | ICD-10-CM | POA: Diagnosis not present

## 2021-09-05 DIAGNOSIS — Z961 Presence of intraocular lens: Secondary | ICD-10-CM | POA: Diagnosis not present

## 2021-09-05 DIAGNOSIS — I10 Essential (primary) hypertension: Secondary | ICD-10-CM | POA: Diagnosis not present

## 2021-09-06 DIAGNOSIS — M5187 Other intervertebral disc disorders, lumbosacral region: Secondary | ICD-10-CM | POA: Diagnosis not present

## 2021-09-06 DIAGNOSIS — M5459 Other low back pain: Secondary | ICD-10-CM | POA: Diagnosis not present

## 2021-09-06 DIAGNOSIS — M25651 Stiffness of right hip, not elsewhere classified: Secondary | ICD-10-CM | POA: Diagnosis not present

## 2021-09-06 DIAGNOSIS — M25652 Stiffness of left hip, not elsewhere classified: Secondary | ICD-10-CM | POA: Diagnosis not present

## 2021-09-07 DIAGNOSIS — L814 Other melanin hyperpigmentation: Secondary | ICD-10-CM | POA: Diagnosis not present

## 2021-09-07 DIAGNOSIS — Z85828 Personal history of other malignant neoplasm of skin: Secondary | ICD-10-CM | POA: Diagnosis not present

## 2021-09-07 DIAGNOSIS — L821 Other seborrheic keratosis: Secondary | ICD-10-CM | POA: Diagnosis not present

## 2021-09-07 DIAGNOSIS — D1801 Hemangioma of skin and subcutaneous tissue: Secondary | ICD-10-CM | POA: Diagnosis not present

## 2021-09-07 DIAGNOSIS — D2262 Melanocytic nevi of left upper limb, including shoulder: Secondary | ICD-10-CM | POA: Diagnosis not present

## 2021-09-11 DIAGNOSIS — M5187 Other intervertebral disc disorders, lumbosacral region: Secondary | ICD-10-CM | POA: Diagnosis not present

## 2021-09-11 DIAGNOSIS — M25652 Stiffness of left hip, not elsewhere classified: Secondary | ICD-10-CM | POA: Diagnosis not present

## 2021-09-11 DIAGNOSIS — M25651 Stiffness of right hip, not elsewhere classified: Secondary | ICD-10-CM | POA: Diagnosis not present

## 2021-09-11 DIAGNOSIS — M5459 Other low back pain: Secondary | ICD-10-CM | POA: Diagnosis not present

## 2021-09-13 DIAGNOSIS — M5187 Other intervertebral disc disorders, lumbosacral region: Secondary | ICD-10-CM | POA: Diagnosis not present

## 2021-09-13 DIAGNOSIS — M25652 Stiffness of left hip, not elsewhere classified: Secondary | ICD-10-CM | POA: Diagnosis not present

## 2021-09-13 DIAGNOSIS — M5459 Other low back pain: Secondary | ICD-10-CM | POA: Diagnosis not present

## 2021-09-13 DIAGNOSIS — M25651 Stiffness of right hip, not elsewhere classified: Secondary | ICD-10-CM | POA: Diagnosis not present

## 2021-09-15 DIAGNOSIS — M5187 Other intervertebral disc disorders, lumbosacral region: Secondary | ICD-10-CM | POA: Diagnosis not present

## 2021-09-15 DIAGNOSIS — M5459 Other low back pain: Secondary | ICD-10-CM | POA: Diagnosis not present

## 2021-09-15 DIAGNOSIS — M25651 Stiffness of right hip, not elsewhere classified: Secondary | ICD-10-CM | POA: Diagnosis not present

## 2021-09-15 DIAGNOSIS — M25652 Stiffness of left hip, not elsewhere classified: Secondary | ICD-10-CM | POA: Diagnosis not present

## 2021-09-18 DIAGNOSIS — M25652 Stiffness of left hip, not elsewhere classified: Secondary | ICD-10-CM | POA: Diagnosis not present

## 2021-09-18 DIAGNOSIS — M25651 Stiffness of right hip, not elsewhere classified: Secondary | ICD-10-CM | POA: Diagnosis not present

## 2021-09-18 DIAGNOSIS — M5459 Other low back pain: Secondary | ICD-10-CM | POA: Diagnosis not present

## 2021-09-18 DIAGNOSIS — M5187 Other intervertebral disc disorders, lumbosacral region: Secondary | ICD-10-CM | POA: Diagnosis not present

## 2021-09-21 DIAGNOSIS — M25652 Stiffness of left hip, not elsewhere classified: Secondary | ICD-10-CM | POA: Diagnosis not present

## 2021-09-21 DIAGNOSIS — M25651 Stiffness of right hip, not elsewhere classified: Secondary | ICD-10-CM | POA: Diagnosis not present

## 2021-09-21 DIAGNOSIS — M5459 Other low back pain: Secondary | ICD-10-CM | POA: Diagnosis not present

## 2021-09-21 DIAGNOSIS — M5187 Other intervertebral disc disorders, lumbosacral region: Secondary | ICD-10-CM | POA: Diagnosis not present

## 2021-09-25 DIAGNOSIS — M25651 Stiffness of right hip, not elsewhere classified: Secondary | ICD-10-CM | POA: Diagnosis not present

## 2021-09-25 DIAGNOSIS — M5187 Other intervertebral disc disorders, lumbosacral region: Secondary | ICD-10-CM | POA: Diagnosis not present

## 2021-09-25 DIAGNOSIS — M5459 Other low back pain: Secondary | ICD-10-CM | POA: Diagnosis not present

## 2021-09-25 DIAGNOSIS — M25652 Stiffness of left hip, not elsewhere classified: Secondary | ICD-10-CM | POA: Diagnosis not present

## 2021-09-26 DIAGNOSIS — Z96652 Presence of left artificial knee joint: Secondary | ICD-10-CM | POA: Diagnosis not present

## 2021-09-26 DIAGNOSIS — M810 Age-related osteoporosis without current pathological fracture: Secondary | ICD-10-CM | POA: Diagnosis not present

## 2021-09-26 DIAGNOSIS — M7052 Other bursitis of knee, left knee: Secondary | ICD-10-CM | POA: Diagnosis not present

## 2021-09-26 DIAGNOSIS — M25562 Pain in left knee: Secondary | ICD-10-CM | POA: Diagnosis not present

## 2021-09-26 DIAGNOSIS — Z96651 Presence of right artificial knee joint: Secondary | ICD-10-CM | POA: Diagnosis not present

## 2021-09-26 DIAGNOSIS — M25561 Pain in right knee: Secondary | ICD-10-CM | POA: Diagnosis not present

## 2021-09-27 DIAGNOSIS — M25651 Stiffness of right hip, not elsewhere classified: Secondary | ICD-10-CM | POA: Diagnosis not present

## 2021-09-27 DIAGNOSIS — M5187 Other intervertebral disc disorders, lumbosacral region: Secondary | ICD-10-CM | POA: Diagnosis not present

## 2021-09-27 DIAGNOSIS — M5459 Other low back pain: Secondary | ICD-10-CM | POA: Diagnosis not present

## 2021-09-27 DIAGNOSIS — M25652 Stiffness of left hip, not elsewhere classified: Secondary | ICD-10-CM | POA: Diagnosis not present

## 2021-10-02 DIAGNOSIS — M25651 Stiffness of right hip, not elsewhere classified: Secondary | ICD-10-CM | POA: Diagnosis not present

## 2021-10-02 DIAGNOSIS — M5187 Other intervertebral disc disorders, lumbosacral region: Secondary | ICD-10-CM | POA: Diagnosis not present

## 2021-10-02 DIAGNOSIS — M5459 Other low back pain: Secondary | ICD-10-CM | POA: Diagnosis not present

## 2021-10-02 DIAGNOSIS — M25652 Stiffness of left hip, not elsewhere classified: Secondary | ICD-10-CM | POA: Diagnosis not present

## 2021-10-04 DIAGNOSIS — M5459 Other low back pain: Secondary | ICD-10-CM | POA: Diagnosis not present

## 2021-10-04 DIAGNOSIS — M5187 Other intervertebral disc disorders, lumbosacral region: Secondary | ICD-10-CM | POA: Diagnosis not present

## 2021-10-04 DIAGNOSIS — M25651 Stiffness of right hip, not elsewhere classified: Secondary | ICD-10-CM | POA: Diagnosis not present

## 2021-10-04 DIAGNOSIS — M25652 Stiffness of left hip, not elsewhere classified: Secondary | ICD-10-CM | POA: Diagnosis not present

## 2021-10-11 DIAGNOSIS — M5459 Other low back pain: Secondary | ICD-10-CM | POA: Diagnosis not present

## 2021-10-11 DIAGNOSIS — M25652 Stiffness of left hip, not elsewhere classified: Secondary | ICD-10-CM | POA: Diagnosis not present

## 2021-10-11 DIAGNOSIS — M25651 Stiffness of right hip, not elsewhere classified: Secondary | ICD-10-CM | POA: Diagnosis not present

## 2021-10-11 DIAGNOSIS — M5187 Other intervertebral disc disorders, lumbosacral region: Secondary | ICD-10-CM | POA: Diagnosis not present

## 2021-10-12 ENCOUNTER — Other Ambulatory Visit: Payer: Self-pay | Admitting: Internal Medicine

## 2021-10-12 DIAGNOSIS — Z1231 Encounter for screening mammogram for malignant neoplasm of breast: Secondary | ICD-10-CM

## 2021-10-17 ENCOUNTER — Ambulatory Visit: Payer: Medicare Other | Admitting: Neurology

## 2021-10-24 DIAGNOSIS — M810 Age-related osteoporosis without current pathological fracture: Secondary | ICD-10-CM | POA: Diagnosis not present

## 2021-10-31 ENCOUNTER — Telehealth: Payer: Self-pay | Admitting: Neurology

## 2021-10-31 ENCOUNTER — Encounter: Payer: Self-pay | Admitting: Neurology

## 2021-10-31 NOTE — Telephone Encounter (Signed)
Pt calling to schedule a Botox appt.  ?

## 2021-11-01 ENCOUNTER — Other Ambulatory Visit: Payer: Self-pay | Admitting: *Deleted

## 2021-11-01 NOTE — Telephone Encounter (Signed)
Order form for neuropathy compound cream refill completed, signed, and faxed to [facility]. Received a receipt of confirmation. ? ? ?Compound: ? ?Meloxicam 0.5% ?Doxepin 3% ?Amantadine 3% ?Dextromethorphan 2% ?Lidocaine 2% ?

## 2021-11-02 ENCOUNTER — Telehealth: Payer: Self-pay | Admitting: Neurology

## 2021-11-02 NOTE — Telephone Encounter (Signed)
Lm to get Botox appt rescheduled. ?

## 2021-11-07 ENCOUNTER — Ambulatory Visit: Payer: PPO | Admitting: Neurology

## 2021-11-07 DIAGNOSIS — M62472 Contracture of muscle, left ankle and foot: Secondary | ICD-10-CM | POA: Diagnosis not present

## 2021-11-07 DIAGNOSIS — G6289 Other specified polyneuropathies: Secondary | ICD-10-CM

## 2021-11-07 DIAGNOSIS — R252 Cramp and spasm: Secondary | ICD-10-CM | POA: Diagnosis not present

## 2021-11-07 DIAGNOSIS — M62471 Contracture of muscle, right ankle and foot: Secondary | ICD-10-CM | POA: Diagnosis not present

## 2021-11-07 NOTE — Progress Notes (Signed)
In the balls of the feet and instep of the right foot (flexor digiotorum brevis, all lumbricals) ? ?11/07/2021: After 3 months the severe pain returned, botox is making a significant difference in pain > 60% improvement in pain. It was best to keep her feet on the chairs and dorsiflex them while I inject bc it hurts a lot. Left foot anterior pad of lower foot and right underneath the toes, on the right the anterior pad of lower foot and part of the instep medially. Keep feet on chair and hold foot in dorsiflexion position tightly while injecting helped with the injection pain. ? ?07/16/2021: unclear how much improvement. We will stop botox for now and see if she reports increased pain when it wears off ?04/11/2021: Botox works great > 60% improvement in pain and quality of life, she can feel it wearing off. ?01/04/2021: > 50% improvement in pain, continue current dose ?10/04/2020: first injections ? ?History: Refractory idiopathic peripheral diabetic polyneuropathy. 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 Hx of diabetic(per-diabetic) neuropathy.  ? ?Procedure: All procedures documented were medically necessary, reasonable and appropriate based on the patient's history, medical diagnosis and physician opinion. Verbal informed consent was obtained from the patient, patient was informed of potential risk of procedure, including bruising, bleeding, hematoma formation, infection, muscle weakness, muscle pain, numbness, transient hypertension, transient hyperglycemia and transient insomnia among others. All areas injected were topically clean with isopropyl rubbing alcohol. Nonsterile nonlatex gloves were worn during the procedure. ? ? Injections were given bilaterally to the plantar surface of the feet.  The feet were properly sterilized with evenly  spaced sites bilaterally injected each with 10 units of botox using a 1 ml 30-gauge needle.  200 units of Botox were used and none was wasted. ? ?Dx: G62.89 ?B/B ?[phone] CPT code and [phone] for additional limb ?

## 2021-11-07 NOTE — Progress Notes (Signed)
Botox- 100 units x 1 vials ?Lot:  [id number] ?Expiration: 12/2023 ?[place]: [phone] ? ?Bacteriostatic 0.9% Sodium Chloride- 55m total ?Lot: [id number] ?Expiration: 03/07/2023 ?[place] [address]-15? ?Dx: G62.89 ?B/B ? ? ? ? ?Botox 100 units -1 vial  ?Lot # [id number]?Expiration: 04/2024 ?NDC: 0023-39-21 ?

## 2021-11-10 ENCOUNTER — Other Ambulatory Visit: Payer: Self-pay

## 2021-11-10 DIAGNOSIS — E785 Hyperlipidemia, unspecified: Secondary | ICD-10-CM

## 2021-11-10 DIAGNOSIS — I5181 Takotsubo syndrome: Secondary | ICD-10-CM

## 2021-11-10 DIAGNOSIS — I10 Essential (primary) hypertension: Secondary | ICD-10-CM

## 2021-11-10 DIAGNOSIS — I480 Paroxysmal atrial fibrillation: Secondary | ICD-10-CM

## 2021-11-10 MED ORDER — HYDRALAZINE HCL 50 MG PO TABS: 50.0000 mg | ORAL_TABLET | Freq: Two times a day (BID) | ORAL | 0 refills | Status: DC

## 2021-11-10 MED ORDER — HYDROCHLOROTHIAZIDE 25 MG PO TABS: 25.0000 mg | ORAL_TABLET | Freq: Every day | ORAL | 0 refills | Status: DC

## 2021-11-16 DIAGNOSIS — I10 Essential (primary) hypertension: Secondary | ICD-10-CM | POA: Diagnosis not present

## 2021-11-16 DIAGNOSIS — I739 Peripheral vascular disease, unspecified: Secondary | ICD-10-CM | POA: Diagnosis not present

## 2021-11-21 ENCOUNTER — Ambulatory Visit
Admission: RE | Admit: 2021-11-21 | Discharge: 2021-11-21 | Disposition: A | Discharge status: Home / Self Care | Payer: PPO | Source: Ambulatory Visit | Attending: Internal Medicine | Admitting: Internal Medicine

## 2021-11-21 DIAGNOSIS — M5125 Other intervertebral disc displacement, thoracolumbar region: Secondary | ICD-10-CM | POA: Diagnosis not present

## 2021-11-21 DIAGNOSIS — Z1231 Encounter for screening mammogram for malignant neoplasm of breast: Secondary | ICD-10-CM

## 2021-11-21 DIAGNOSIS — M25551 Pain in right hip: Secondary | ICD-10-CM | POA: Diagnosis not present

## 2021-11-21 DIAGNOSIS — R262 Difficulty in walking, not elsewhere classified: Secondary | ICD-10-CM | POA: Diagnosis not present

## 2021-11-27 DIAGNOSIS — M5187 Other intervertebral disc disorders, lumbosacral region: Secondary | ICD-10-CM | POA: Diagnosis not present

## 2021-11-27 DIAGNOSIS — M25651 Stiffness of right hip, not elsewhere classified: Secondary | ICD-10-CM | POA: Diagnosis not present

## 2021-11-27 DIAGNOSIS — M5459 Other low back pain: Secondary | ICD-10-CM | POA: Diagnosis not present

## 2021-11-27 DIAGNOSIS — M25652 Stiffness of left hip, not elsewhere classified: Secondary | ICD-10-CM | POA: Diagnosis not present

## 2021-11-29 DIAGNOSIS — M25652 Stiffness of left hip, not elsewhere classified: Secondary | ICD-10-CM | POA: Diagnosis not present

## 2021-11-29 DIAGNOSIS — M25651 Stiffness of right hip, not elsewhere classified: Secondary | ICD-10-CM | POA: Diagnosis not present

## 2021-11-29 DIAGNOSIS — M5459 Other low back pain: Secondary | ICD-10-CM | POA: Diagnosis not present

## 2021-11-29 DIAGNOSIS — M5187 Other intervertebral disc disorders, lumbosacral region: Secondary | ICD-10-CM | POA: Diagnosis not present

## 2021-12-03 DIAGNOSIS — E039 Hypothyroidism, unspecified: Secondary | ICD-10-CM | POA: Diagnosis not present

## 2021-12-03 DIAGNOSIS — M810 Age-related osteoporosis without current pathological fracture: Secondary | ICD-10-CM | POA: Diagnosis not present

## 2021-12-03 DIAGNOSIS — I10 Essential (primary) hypertension: Secondary | ICD-10-CM | POA: Diagnosis not present

## 2021-12-05 DIAGNOSIS — M5125 Other intervertebral disc displacement, thoracolumbar region: Secondary | ICD-10-CM | POA: Diagnosis not present

## 2021-12-05 DIAGNOSIS — R262 Difficulty in walking, not elsewhere classified: Secondary | ICD-10-CM | POA: Diagnosis not present

## 2021-12-05 DIAGNOSIS — M25551 Pain in right hip: Secondary | ICD-10-CM | POA: Diagnosis not present

## 2021-12-07 ENCOUNTER — Ambulatory Visit (HOSPITAL_BASED_OUTPATIENT_CLINIC_OR_DEPARTMENT_OTHER): Payer: Medicare Other | Admitting: Obstetrics & Gynecology

## 2021-12-11 ENCOUNTER — Telehealth: Payer: Self-pay | Admitting: Neurology

## 2021-12-11 MED ORDER — GABAPENTIN 300 MG PO CAPS: 900.0000 mg | ORAL_CAPSULE | Freq: Three times a day (TID) | ORAL | 3 refills | Status: DC

## 2021-12-11 NOTE — Telephone Encounter (Signed)
Pt is requesting a refill for gabapentin (NEURONTIN) 300 MG capsule . ? ?Pharmacy:  [id number] ? ?

## 2021-12-11 NOTE — Telephone Encounter (Signed)
done

## 2021-12-12 DIAGNOSIS — R262 Difficulty in walking, not elsewhere classified: Secondary | ICD-10-CM | POA: Diagnosis not present

## 2021-12-12 DIAGNOSIS — M5125 Other intervertebral disc displacement, thoracolumbar region: Secondary | ICD-10-CM | POA: Diagnosis not present

## 2021-12-12 DIAGNOSIS — M25551 Pain in right hip: Secondary | ICD-10-CM | POA: Diagnosis not present

## 2021-12-26 DIAGNOSIS — M5125 Other intervertebral disc displacement, thoracolumbar region: Secondary | ICD-10-CM | POA: Diagnosis not present

## 2021-12-26 DIAGNOSIS — R262 Difficulty in walking, not elsewhere classified: Secondary | ICD-10-CM | POA: Diagnosis not present

## 2021-12-26 DIAGNOSIS — M25551 Pain in right hip: Secondary | ICD-10-CM | POA: Diagnosis not present

## 2022-01-03 DIAGNOSIS — E039 Hypothyroidism, unspecified: Secondary | ICD-10-CM | POA: Diagnosis not present

## 2022-01-03 DIAGNOSIS — M810 Age-related osteoporosis without current pathological fracture: Secondary | ICD-10-CM | POA: Diagnosis not present

## 2022-01-03 DIAGNOSIS — I10 Essential (primary) hypertension: Secondary | ICD-10-CM | POA: Diagnosis not present

## 2022-01-20 ENCOUNTER — Other Ambulatory Visit: Payer: Self-pay | Admitting: Endocrinology

## 2022-01-22 ENCOUNTER — Other Ambulatory Visit: Payer: Self-pay

## 2022-01-22 MED ORDER — AMLODIPINE BESYLATE 5 MG PO TABS: 5.0000 mg | ORAL_TABLET | Freq: Every day | ORAL | 0 refills | Status: DC

## 2022-01-30 ENCOUNTER — Other Ambulatory Visit (HOSPITAL_COMMUNITY)
Admission: RE | Admit: 2022-01-30 | Discharge: 2022-01-30 | Disposition: A | Discharge status: Home / Self Care | Payer: PPO | Source: Ambulatory Visit | Attending: Obstetrics & Gynecology | Admitting: Obstetrics & Gynecology

## 2022-01-30 ENCOUNTER — Ambulatory Visit (INDEPENDENT_AMBULATORY_CARE_PROVIDER_SITE_OTHER): Payer: PPO | Admitting: Obstetrics & Gynecology

## 2022-01-30 ENCOUNTER — Encounter (HOSPITAL_BASED_OUTPATIENT_CLINIC_OR_DEPARTMENT_OTHER): Payer: Self-pay | Admitting: Obstetrics & Gynecology

## 2022-01-30 VITALS — BP 135/74 | HR 66 | Ht 63.0 in | Wt 150.8 lb

## 2022-01-30 DIAGNOSIS — Z124 Encounter for screening for malignant neoplasm of cervix: Secondary | ICD-10-CM | POA: Diagnosis not present

## 2022-01-30 DIAGNOSIS — Z780 Asymptomatic menopausal state: Secondary | ICD-10-CM

## 2022-01-30 DIAGNOSIS — Z9189 Other specified personal risk factors, not elsewhere classified: Secondary | ICD-10-CM

## 2022-01-30 DIAGNOSIS — M353 Polymyalgia rheumatica: Secondary | ICD-10-CM

## 2022-01-30 DIAGNOSIS — Z87410 Personal history of cervical dysplasia: Secondary | ICD-10-CM

## 2022-01-30 NOTE — Progress Notes (Signed)
81 y.o. G56P3001 Widowed White or Caucasian female here for breast and pelvic exam.  I am also following her for dense breast tissue.  She asks about screening ultrasound.  We discussed the benefits and pitfalls of this test for screening.  Reviewed history and any testing done since last visit discussed.  Obtaining some of this history was more difficult today as pt was not completely sure where she had certain tests.  Tried to access her PCP's electronic record which was unsuccessful.  Denies vaginal bleeding.  No LMP recorded. Patient is postmenopausal.          Sexually active: No.  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 Shelia Media.  Last wellness appt was in 07/2021.  Does so lab work at his office Vaccines are up to date:  yes Colonoscopy:  05/19/2018 MMG:  11/21/2021 Negative BMD:  03/19/2019.  Pt is absolutley sure she had one done this year but it in not in Epic. Last pap smear:  07/26/2011.   H/o abnormal pap smear:  yes, h/o CIN1    reports that she has never smoked. She has never used smokeless tobacco. She reports current alcohol use. She reports that she does not use drugs.  Past Medical History:  Diagnosis Date   Abnormal EKG    Atrial fibrillation (HCC)    Basal cell carcinoma of nose    removed w/MOHs   CAD (coronary artery disease)    a. 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 EF 30-35% at [facility] 06/2013;  b.  f/u Echo (06/18/13):  EF 60-65%, normal wall motion, Gr 1 DD, mild MVP of post leaflet, mod TR, PASP 63   Thyroid disease    Tricuspid regurgitation    mild to moderate, echo, January, 2011, PA pressure 38 mm mercury    Past Surgical History:  Procedure Laterality Date   CARDIAC CATHETERIZATION  06/17/2013   COLONOSCOPY  12/23/2003   normal (indications: prior adenomas and grandfather with colon cancer)   COLPOSCOPY  EXCISIONAL HEMORRHOIDECTOMY  2003   LEFT HEART CATHETERIZATION WITH CORONARY ANGIOGRAM N/A 06/17/2013   Procedure: LEFT HEART CATHETERIZATION WITH CORONARY ANGIOGRAM;  Surgeon: Peter M Martinique, MD;  Location: [facility] CATH LAB;  Service: Cardiovascular;  Laterality: N/A;   MOHS SURGERY Left 2011   "side of my nose" (06/17/2013)   TONSILLECTOMY     TOTAL KNEE ARTHROPLASTY  08/2010   TOTAL KNEE ARTHROPLASTY  02/25/2012   Procedure: TOTAL KNEE ARTHROPLASTY;  Surgeon: Gearlean Alf, MD;  Location: [facility] ORS;  Service: Orthopedics;  Laterality: Right;    Current Outpatient Medications  Medication Sig Dispense Refill   ALPHA LIPOIC ACID PO Take by mouth.     amLODipine (NORVASC) 5 MG tablet Take 1 tablet (5 mg total) by mouth daily. Call and schedule follow up visit to receive further refills. Thanks. [phone]. 1st attempt 30 tablet 0   B Complex  Vitamins (B COMPLEX PO) Take by mouth.     Biotin 5000 MCG CAPS Take 1 capsule by mouth daily.      Calcium Carbonate (CALTRATE 600 PO) Take 1 tablet by mouth 2 (two) times a day.      Cholecalciferol 25 MCG (1000 UT) capsule Take 1,000 Units by mouth daily.     denosumab (PROLIA) 60 MG/ML SOSY injection Inject 60 mg into the skin every 6 (six) months.     diclofenac sodium (VOLTAREN) 1 % GEL 1 application.  2   enalapril (VASOTEC) 20 MG tablet Take 1 tablet (20 mg total) by mouth 2 (two) times daily. 180 tablet 2   EVENING PRIMROSE OIL PO Take 1 capsule by mouth 2 (two) times a day.      gabapentin (NEURONTIN) 300 MG capsule Take 3 capsules (900 mg total) by mouth 3 (three) times daily. 270 capsule 3   hydrALAZINE (APRESOLINE) 50 MG tablet Take 1 tablet (50 mg total) by mouth 2 (two) times daily. 180 tablet 0   hydrochlorothiazide (HYDRODIURIL) 25 MG tablet Take 1 tablet (25 mg total) by mouth daily. 90 tablet 0   levothyroxine (SYNTHROID) 75 MCG tablet TAKE 1 TABLET BY MOUTH EVERY DAY 90 tablet 2   meloxicam (MOBIC) 15 MG tablet Take 15 mg by mouth daily.     methylcellulose packet Take 1 each by mouth daily. CITRICEL     metroNIDAZOLE (METROCREAM) 0.75 % cream Apply 1 application topically 2 (two) times daily.     NONFORMULARY OR COMPOUNDED ITEM Compound cream: Meloxicam 0.5% Doxepin 3% Amantadine 3% Dextromethorphan 2% Lidocaine 2%  Sig: Apply 1-2 grams to the affected area 3-4 times daily. Dispense 240 grams with 5 refills.     pantoprazole (PROTONIX) 40 MG tablet Take 1 tablet by mouth daily.     tiZANidine (ZANAFLEX) 2 MG tablet Take 1 tablet (2 mg total) by mouth every 8 (eight) hours as needed for muscle spasms. 30 tablet 0   tretinoin (RETIN-A) 0.05 % cream Apply 1 application topically at bedtime.     zolpidem (AMBIEN) 5 MG tablet Take 5 mg by mouth at bedtime as needed for sleep.     atorvastatin (LIPITOR) 20 MG tablet Take 1 tablet (20 mg total) by mouth daily. 90 tablet 3    No current facility-administered medications for this visit.    Family History  Problem Relation Age of Onset   Heart attack Mother 49   Hypertension Mother    Heart disease Mother    Stroke Mother    Heart attack Father 47   Coronary artery disease  Father        Multiple Family Members   Hypertension Father    Heart disease Father    Esophageal cancer Paternal Grandmother    Colon cancer Maternal Grandfather    Heart disease Maternal Grandfather    Irritable bowel syndrome Sister        More family members on father side of    Hypertension Sister    Heart disease Sister    Breast cancer Sister 72   Breast cancer Paternal 45        Age 62's   Heart disease Paternal Grandfather    Rectal cancer Neg Hx    Stomach cancer Neg Hx     Review of Systems  All other systems reviewed and are negative.   Exam:   BP 135/74 (BP Location: Right Arm, Patient Position: Sitting, Cuff Size: Large)   Pulse 66   Ht '5\' 3"'$  (1.6 m)   Wt 150 lb 12.8 oz (68.4 kg)   BMI 26.71 kg/m   Height: '5\' 3"'$  (160 cm)  General appearance: alert, cooperative and appears stated age Breasts: normal appearance, no masses or tenderness Abdomen: soft, non-tender; bowel sounds normal; no masses,  no organomegaly Lymph nodes: Cervical, supraclavicular, and axillary nodes normal.  No abnormal inguinal nodes palpated Neurologic: Grossly normal  Pelvic: External genitalia:  no lesions              Urethra:  normal appearing urethra with no masses, tenderness or lesions              Bartholins and Skenes: normal                 Vagina: normal appearing vagina with atrophic changes and no discharge, no lesions              Cervix: no lesions              Pap taken: Yes.   Bimanual Exam:  Uterus:  normal size, contour, position, consistency, mobility, non-tender              Adnexa: normal adnexa and no mass, fullness, tenderness               Rectovaginal: Confirms               Anus:  normal sphincter  tone, no lesions  Chaperone, Octaviano Batty, CMA, was present for exam.  Assessment/Plan: 1. GYN exam for high-risk Medicare patient - pap obtained today.  [employer] study discussed. - Mammogram 11/2021 - colonoscopy 2019 - BMD done with Dr. Shelia Media - vaccines reviewed/updated  2. Cervical cancer screening - Cytology - PAP( [place]) - PR OBTAINING SCREEN PAP SMEAR  3. Polymyalgia rheumatica ([place]) - followed by DR. Beekman  4. Postmenopausal - no HRT  5. History of cervical dysplasia  Total time with pt and documentation today: 28 minutes

## 2022-02-01 ENCOUNTER — Telehealth: Payer: Self-pay | Admitting: Neurology

## 2022-02-01 NOTE — Telephone Encounter (Signed)
I called pt [name] to get Botox appt rescheduled due to provider being out.

## 2022-02-02 DIAGNOSIS — I10 Essential (primary) hypertension: Secondary | ICD-10-CM | POA: Diagnosis not present

## 2022-02-02 DIAGNOSIS — M810 Age-related osteoporosis without current pathological fracture: Secondary | ICD-10-CM | POA: Diagnosis not present

## 2022-02-02 DIAGNOSIS — E039 Hypothyroidism, unspecified: Secondary | ICD-10-CM | POA: Diagnosis not present

## 2022-02-02 LAB — CYTOLOGY - PAP: Diagnosis: NEGATIVE

## 2022-02-05 ENCOUNTER — Ambulatory Visit: Payer: PPO | Admitting: Neurology

## 2022-02-08 ENCOUNTER — Other Ambulatory Visit: Payer: Self-pay

## 2022-02-08 DIAGNOSIS — I5181 Takotsubo syndrome: Secondary | ICD-10-CM

## 2022-02-08 DIAGNOSIS — I480 Paroxysmal atrial fibrillation: Secondary | ICD-10-CM

## 2022-02-08 MED ORDER — ENALAPRIL MALEATE 20 MG PO TABS: 20.0000 mg | ORAL_TABLET | Freq: Two times a day (BID) | ORAL | 0 refills | Status: DC

## 2022-02-08 MED ORDER — HYDROCHLOROTHIAZIDE 25 MG PO TABS: 25.0000 mg | ORAL_TABLET | Freq: Every day | ORAL | 0 refills | Status: DC

## 2022-02-08 NOTE — Addendum Note (Signed)
Addended by: Carter Kitten D on: 02/08/2022 11:24 AM   Modules accepted: Orders

## 2022-02-09 NOTE — Telephone Encounter (Signed)
Pt calling to confirm appt  for Botox and reschedule if needed. Would like a call  back.

## 2022-02-12 NOTE — Telephone Encounter (Signed)
I called pt got Botox appt rescheduled. 

## 2022-02-13 ENCOUNTER — Ambulatory Visit: Payer: PPO | Admitting: Neurology

## 2022-02-13 DIAGNOSIS — H11823 Conjunctivochalasis, bilateral: Secondary | ICD-10-CM | POA: Diagnosis not present

## 2022-02-19 ENCOUNTER — Other Ambulatory Visit: Payer: Self-pay

## 2022-02-19 MED ORDER — AMLODIPINE BESYLATE 5 MG PO TABS: 5.0000 mg | ORAL_TABLET | Freq: Every day | ORAL | 0 refills | Status: DC

## 2022-02-27 ENCOUNTER — Ambulatory Visit: Payer: PPO | Admitting: Neurology

## 2022-02-27 DIAGNOSIS — G6289 Other specified polyneuropathies: Secondary | ICD-10-CM

## 2022-02-27 MED ORDER — ONABOTULINUMTOXINA 100 UNITS IJ SOLR
100.0000 [IU] | Freq: Once | INTRAMUSCULAR | Status: AC
  Administered 2022-02-27: 100 [IU] via INTRAMUSCULAR

## 2022-02-27 NOTE — Progress Notes (Signed)
Botox- 100 units x 1 vials Lot: [id number] Expiration: 06/2024 NDC: 0312-8118-86  Bacteriostatic 0.9% Sodium Chloride- 65m total Lot: [id number] Expiration: 03/07/2023 NDC: [address]-15 Dx: GT47.07 B/B

## 2022-02-27 NOTE — Progress Notes (Unsigned)
Cardiology Office Note:    Date:  03/01/2022   ID:  Ismerai, Bin 03/23/1941, MRN [id number]  PCP:  Deland Pretty, MD   [facility] Providers Cardiologist:  None {   Referring MD: Deland Pretty, MD     History of Present Illness:    [name] is a 81 y.o. 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 Meda Coffee who now returns to clinic for follow-up.  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 She was treated with RAI.  Echocardiogram in 2014 normal LVEF and severe pulmonary HTN with RVSP 63 mmHg. Repeat echo in 2016 showed improvement in PA pressure to normal 25 mmHg, moderate MVP and mild mitral regurgitation. Repeat TTE 04/2020 with LVEF 60-65%, mild LVH, mild MR, normal PASP, mild TR.  Was last seen in clinic on 01/2021 where she was doing well from a CV standpoint.  Today, the patient overall feels well. No chest pain, SOB, orthopnea, or PND. Has occasional LE edema that is chronic. Blood pressure is well controlled. Tolerating medications without issues. Remains very active without exertional symptoms.   Past Medical History:  Diagnosis Date   Abnormal EKG    Atrial fibrillation (HCC)    Basal cell carcinoma of nose    removed w/MOHs   CAD (coronary artery disease)    a. 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 EF 30-35% at [facility] 06/2013;  b.  f/u Echo (06/18/13):  EF 60-65%, normal wall motion, Gr 1 DD, mild MVP of post leaflet, mod TR, PASP 63   Thyroid disease    Tricuspid regurgitation    mild to moderate, echo, January, 2011, PA pressure 38 mm mercury    Past Surgical History:  Procedure Laterality Date   CARDIAC CATHETERIZATION  06/17/2013   COLONOSCOPY  12/23/2003   normal (indications: prior adenomas and grandfather with colon cancer)  COLPOSCOPY     EXCISIONAL HEMORRHOIDECTOMY  2003   LEFT HEART CATHETERIZATION WITH CORONARY ANGIOGRAM N/A 06/17/2013   Procedure: LEFT HEART CATHETERIZATION WITH CORONARY ANGIOGRAM;  Surgeon: Peter M Martinique, MD;  Location: [facility] CATH LAB;  Service: Cardiovascular;  Laterality: N/A;   MOHS SURGERY Left 2011   "side of my nose" (06/17/2013)   TONSILLECTOMY     TOTAL KNEE ARTHROPLASTY  08/2010   TOTAL KNEE ARTHROPLASTY  02/25/2012   Procedure: TOTAL KNEE ARTHROPLASTY;  Surgeon: Gearlean Alf, MD;  Location: [facility];  Service: Orthopedics;  Laterality: Right;    Current Medications: Current Meds  Medication Sig   ALPHA LIPOIC ACID PO Take by mouth.   Biotin 5000 MCG CAPS Take 1 capsule by mouth daily.    Calcium Carbonate (CALTRATE 600 PO) Take 1 tablet by mouth 2  (two) times a day.    Cholecalciferol 25 MCG (1000 UT) capsule Take 1,000 Units by mouth daily.   denosumab (PROLIA) 60 MG/ML SOSY injection Inject 60 mg into the skin every 6 (six) months.   diclofenac sodium (VOLTAREN) 1 % GEL 1 application.   EVENING PRIMROSE OIL PO Take 1 capsule by mouth 2 (two) times a day.    gabapentin (NEURONTIN) 300 MG capsule Take 3 capsules (900 mg total) by mouth 3 (three) times daily.   levothyroxine (SYNTHROID) 75 MCG tablet TAKE 1 TABLET BY MOUTH EVERY DAY   meloxicam (MOBIC) 15 MG tablet Take 15 mg by mouth daily.   methylcellulose packet Take 1 each by mouth daily. CITRICEL   metroNIDAZOLE (METROCREAM) 0.75 % cream Apply 1 application topically 2 (two) times daily.   NONFORMULARY OR COMPOUNDED ITEM Compound cream: Meloxicam 0.5% Doxepin 3% Amantadine 3% Dextromethorphan 2% Lidocaine 2%  Sig: Apply 1-2 grams to the affected area 3-4 times daily. Dispense 240 grams with 5 refills.   pantoprazole (PROTONIX) 40 MG tablet Take 1 tablet by mouth daily.   tretinoin (RETIN-A) 0.05 % cream Apply 1 application topically at bedtime.   zolpidem (AMBIEN) 5 MG tablet Take 5 mg by mouth at bedtime as needed for sleep.   [DISCONTINUED] amLODipine (NORVASC) 5 MG tablet Take 1 tablet (5 mg total) by mouth daily. KEEP FOLLOW UP VISIT TO ASK FOR REFILLS.   [DISCONTINUED] enalapril (VASOTEC) 20 MG tablet Take 1 tablet (20 mg total) by mouth 2 (two) times daily.   [DISCONTINUED] hydrALAZINE (APRESOLINE) 50 MG tablet Take 1 tablet (50 mg total) by mouth 2 (two) times daily.   [DISCONTINUED] hydrochlorothiazide (HYDRODIURIL) 25 MG tablet Take 1 tablet (25 mg total) by mouth daily.   Current Facility-Administered Medications for the 03/01/22 encounter (Office Visit) with Freada Bergeron, MD  Medication   botulinum toxin Type A (BOTOX) injection 100 Units     Allergies:   Patient has no known allergies.   Social History   Socioeconomic History   Marital status:  Widowed    Spouse name: Not on file   Number of children: 1   Years of education: Not on file   Highest education level: Not on file  Occupational History   Occupation: Homemaker  Tobacco Use   Smoking status: Never   Smokeless tobacco: Never  Vaping Use   Vaping Use: Never used  Substance and Sexual Activity   Alcohol use: Yes    Alcohol/week: 0.0 standard drinks of alcohol    Comment: Rare   Drug use: No   Sexual activity: Never    Birth control/protection: Post-menopausal    Comment:  1st intercourse 81 yo-Fewer than 5 partners  Other Topics Concern   Not on file  Social History Narrative   3 caffeine drinks daily    Widowed   Retired      Lives alone   Right handed   Caffeine: 1.5 cup/day   Social Determinants of Radio broadcast assistant Strain: Not on file  Food Insecurity: Not on file  Transportation Needs: Not on file  Physical Activity: Not on file  Stress: Not on file  Social Connections: Not on file     Family History: The patient's family history includes Breast cancer in her paternal aunt; Breast cancer (age of onset: 68) in her sister; Colon cancer in her maternal grandfather; Coronary artery disease in her father; Esophageal cancer in her paternal grandmother; Heart attack (age of onset: 18) in her mother; Heart attack (age of onset: 54) in her father; Heart disease in her father, maternal grandfather, mother, paternal grandfather, and sister; Hypertension in her father, mother, and sister; Irritable bowel syndrome in her sister; Stroke in her mother. There is no history of Rectal cancer or Stomach cancer.  ROS:   Please see the history of present illness.    Review of Systems  Constitutional:  Negative for chills and fever.  HENT:  Negative for sore throat.   Eyes:  Negative for blurred vision.  Respiratory:  Negative for shortness of breath.   Cardiovascular:  Negative for chest pain, palpitations, orthopnea, claudication, leg swelling and PND.   Gastrointestinal:  Negative for nausea and vomiting.  Genitourinary:  Negative for flank pain.  Musculoskeletal:  Negative for falls.  Neurological:  Positive for dizziness and loss of consciousness.  Psychiatric/Behavioral:  Negative for substance abuse.      EKGs/Labs/Other Studies Reviewed:    The following studies were reviewed today: TTE Apr 21, 2020: IMPRESSIONS   1. Left ventricular ejection fraction, by estimation, is 60 to 65%. The  left ventricle has normal function. The left ventricle has no regional  wall motion abnormalities. There is mild left ventricular hypertrophy.  Left ventricular diastolic parameters  are indeterminate.   2. Right ventricular systolic function is normal. The right ventricular  size is normal. There is normal pulmonary artery systolic pressure.   3. The mitral valve is normal in structure. Mild mitral valve  regurgitation. No evidence of mitral stenosis.   4. The aortic valve is normal in structure. Aortic valve regurgitation is  not visualized. No aortic stenosis is present.   EKG:  EKG is  ordered today.  The ekg ordered today demonstrates NSR with HR 66  Recent Labs: 05/16/2021: TSH 1.77  Recent Lipid Panel    Component Value Date/Time   CHOL 160 06/03/2018 1000   TRIG 67 06/03/2018 1000   TRIG 69 05/27/2006 0928   HDL 77 06/03/2018 1000   CHOLHDL 2.1 06/03/2018 1000   CHOLHDL 2 01/13/2015 0918   VLDL 11.6 01/13/2015 0918   LDLCALC 70 06/03/2018 1000         Physical Exam:    VS:  BP 132/76   Pulse 66   Ht '5\' 4"'$  (1.626 m)   Wt 151 lb 12.8 oz (68.9 kg)   SpO2 95%   BMI 26.06 kg/m     Wt Readings from Last 3 Encounters:  03/01/22 151 lb 12.8 oz (68.9 kg)  01/30/22 150 lb 12.8 oz (68.4 kg)  08/10/21 153 lb (69.4 kg)     GEN:  Well nourished, well developed in no acute distress HEENT:  Normal NECK: No JVD; No carotid bruits CARDIAC: RRR, no murmurs, rubs, gallops RESPIRATORY:  Clear to auscultation without rales, wheezing  or rhonchi  ABDOMEN: Soft, non-tender, non-distended MUSCULOSKELETAL:  No edema; No deformity  SKIN: Warm and dry NEUROLOGIC:  Alert and oriented x 3 PSYCHIATRIC:  Normal affect   ASSESSMENT:    1. Takotsubo syndrome   2. Hyperlipidemia, unspecified hyperlipidemia type   3. Hypertension, essential   4. Paroxysmal atrial fibrillation (HCC)   5. MVP (mitral valve prolapse)    PLAN:    In order of problems listed above:  #History of Takotsubo CM: Occurred in 2014 in the setting of Graves disease. Cath with no obstructive CAD. EF now recovered on TTE in 2021 with LVEF 60-65%. Currently NYHA class I symptoms. -Continue to monitor  #Isolated episode of Afib: Occurred in the setting of Graves disease and takotsubo as above. No recurrence. -Continue to monitor  #Graves Disease: S/p RAI treatment. -Follow-up with Endocrinology as scheduled  #MVP: Stable on TTE in 04/2020 with mild MR. -Continue serial monitoring with next TTE in 2024  #HLD: LDL 65 in 11/2021. -Continue lipitor '20mg'$  daily  #HTN: Well controlled and at goal <120s/80s. -Continue enalapril '20mg'$  BID -Continue amlodipine '5mg'$  daily -Continue HCTZ '25mg'$  daily -Continue hydralazine '50mg'$  BID     Medication Adjustments/Labs and Tests Ordered: Current medicines are reviewed at length with the patient today.  Concerns regarding medicines are outlined above.  Orders Placed This Encounter  Procedures   EKG 12-Lead   Meds ordered this encounter  Medications   amLODipine (NORVASC) 5 MG tablet    Sig: Take 1 tablet (5 mg total) by mouth daily.    Dispense:  90 tablet    Refill:  3   atorvastatin (LIPITOR) 20 MG tablet    Sig: Take 1 tablet (20 mg total) by mouth daily.    Dispense:  90 tablet    Refill:  3   hydrALAZINE (APRESOLINE) 50 MG tablet    Sig: Take 1 tablet (50 mg total) by mouth 2 (two) times daily.    Dispense:  180 tablet    Refill:  3   hydrochlorothiazide (HYDRODIURIL) 25 MG tablet    Sig: Take  1 tablet (25 mg total) by mouth daily.    Dispense:  90 tablet    Refill:  3   enalapril (VASOTEC) 20 MG tablet    Sig: Take 1 tablet (20 mg total) by mouth 2 (two) times daily.    Dispense:  180 tablet    Refill:  3    Patient Instructions  Medication Instructions:  Your physician recommends that you continue on your current medications as directed. Please refer to the Current Medication list given to you today.  *If you need a refill on your cardiac medications before your next appointment, please call your pharmacy*   Testing/Procedures: Your physician has requested that you have an echocardiogram in September 2024. Echocardiography is a painless test that uses sound waves to create images of your heart. It provides your doctor with information about the size and shape of your heart and how well your heart's chambers and valves are working. This procedure takes approximately one hour. There are no restrictions for this procedure.  Follow-Up: At [facility], you and your health needs are our priority.  As part of our continuing mission to provide you with exceptional heart care, we have created designated Provider Care Teams.  These Care Teams include your primary Cardiologist (physician) and Advanced Practice  Providers (APPs -  Physician Assistants and Nurse Practitioners) who all work together to provide you with the care you need, when you need it.  Your next appointment:   1 year(s)  The format for your next appointment:   In Person  Provider:   Gwyndolyn Kaufman, MD   Important Information About Sugar          Signed, Freada Bergeron, MD  03/01/2022 4:21 PM

## 2022-02-27 NOTE — Progress Notes (Signed)
Made the syringes more concentrated and used 2 instead of 4. Less injections, worked better?, will see if it helps as much or will go back to 4 syringes.  Also gave only 100, may go back to 200 based on response - ask before mixing.  03/05/2022: Significanty helping. Concentrate into 2 syringes. >> 60% improvement in pain  In the balls of the feet and instep of the right foot (flexor digiotorum brevis, all lumbricals)  11/07/2021: After 3 months the severe pain returned, botox is making a significant difference in pain > 60% improvement in pain. It was best to keep her feet on the chairs and dorsiflex them while I inject bc it hurts a lot. Left foot anterior pad of lower foot and right underneath the toes, on the right the anterior pad of lower foot and part of the instep medially. Keep feet on chair and hold foot in dorsiflexion position tightly while injecting helped with the injection pain.  07/16/2021: unclear how much improvement. We will stop botox for now and see if she reports increased pain when it wears off 04/11/2021: Botox works great > 60% improvement in pain and quality of life, she can feel it wearing off. 01/04/2021: > 50% improvement in pain, continue current dose 10/04/2020: first injections  History: Refractory idiopathic peripheral diabetic polyneuropathy. 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 Hx of diabetic(per-diabetic) neuropathy.   Procedure: All procedures documented were medically necessary, reasonable and appropriate based on the patient's history, medical diagnosis and physician opinion. Verbal informed consent was obtained from the patient, patient was informed of potential risk of procedure, including bruising, bleeding, hematoma formation, infection, muscle weakness, muscle pain, numbness, transient  hypertension, transient hyperglycemia and transient insomnia among others. All areas injected were topically clean with isopropyl rubbing alcohol. Nonsterile nonlatex gloves were worn during the procedure.   Injections were given bilaterally to the plantar surface of the feet.  The feet were properly sterilized with evenly spaced sites bilaterally injected each with 10 units of botox using a 1 ml 30-gauge needle.  200 units of Botox were used and none was wasted.  Dx: G62.89 B/B 62694 CPT code and [id number] for additional limb

## 2022-03-01 ENCOUNTER — Ambulatory Visit: Payer: PPO | Admitting: Cardiology

## 2022-03-01 ENCOUNTER — Encounter: Payer: Self-pay | Admitting: Cardiology

## 2022-03-01 VITALS — BP 132/76 | HR 66 | Ht 64.0 in | Wt 151.8 lb

## 2022-03-01 DIAGNOSIS — I10 Essential (primary) hypertension: Secondary | ICD-10-CM | POA: Diagnosis not present

## 2022-03-01 DIAGNOSIS — E785 Hyperlipidemia, unspecified: Secondary | ICD-10-CM

## 2022-03-01 DIAGNOSIS — I5181 Takotsubo syndrome: Secondary | ICD-10-CM

## 2022-03-01 DIAGNOSIS — I341 Nonrheumatic mitral (valve) prolapse: Secondary | ICD-10-CM | POA: Diagnosis not present

## 2022-03-01 DIAGNOSIS — I480 Paroxysmal atrial fibrillation: Secondary | ICD-10-CM

## 2022-03-01 MED ORDER — ENALAPRIL MALEATE 20 MG PO TABS: 20.0000 mg | ORAL_TABLET | Freq: Two times a day (BID) | ORAL | 3 refills | Status: DC

## 2022-03-01 MED ORDER — AMLODIPINE BESYLATE 5 MG PO TABS: 5.0000 mg | ORAL_TABLET | Freq: Every day | ORAL | 3 refills | Status: DC

## 2022-03-01 MED ORDER — HYDROCHLOROTHIAZIDE 25 MG PO TABS: 25.0000 mg | ORAL_TABLET | Freq: Every day | ORAL | 3 refills | Status: DC

## 2022-03-01 MED ORDER — HYDRALAZINE HCL 50 MG PO TABS: 50.0000 mg | ORAL_TABLET | Freq: Two times a day (BID) | ORAL | 3 refills | Status: DC

## 2022-03-01 MED ORDER — ATORVASTATIN CALCIUM 20 MG PO TABS: 20.0000 mg | ORAL_TABLET | Freq: Every day | ORAL | 3 refills | Status: DC

## 2022-03-01 NOTE — Patient Instructions (Signed)
Medication Instructions:  Your physician recommends that you continue on your current medications as directed. Please refer to the Current Medication list given to you today.  *If you need a refill on your cardiac medications before your next appointment, please call your pharmacy*   Testing/Procedures: Your physician has requested that you have an echocardiogram in September 2024. Echocardiography is a painless test that uses sound waves to create images of your heart. It provides your doctor with information about the size and shape of your heart and how well your heart's chambers and valves are working. This procedure takes approximately one hour. There are no restrictions for this procedure.  Follow-Up: At [facility], you and your health needs are our priority.  As part of our continuing mission to provide you with exceptional heart care, we have created designated Provider Care Teams.  These Care Teams include your primary Cardiologist (physician) and Advanced Practice Providers (APPs -  Physician Assistants and Nurse Practitioners) who all work together to provide you with the care you need, when you need it.  Your next appointment:   1 year(s)  The format for your next appointment:   In Person  Provider:   Gwyndolyn Kaufman, MD   Important Information About Sugar

## 2022-03-02 ENCOUNTER — Telehealth: Payer: Self-pay | Admitting: *Deleted

## 2022-03-02 DIAGNOSIS — I341 Nonrheumatic mitral (valve) prolapse: Secondary | ICD-10-CM

## 2022-03-02 DIAGNOSIS — I340 Nonrheumatic mitral (valve) insufficiency: Secondary | ICD-10-CM

## 2022-03-02 NOTE — Telephone Encounter (Signed)
Pt will need an echo in Sept 2024 per Dr. Johney Frame.  Order for echo will be placed and will send our [place] a message to call her back and arrange this appt.   Pt made aware of this at [place] with Dr. Johney Frame.

## 2022-03-21 DIAGNOSIS — M25551 Pain in right hip: Secondary | ICD-10-CM | POA: Diagnosis not present

## 2022-03-21 DIAGNOSIS — R262 Difficulty in walking, not elsewhere classified: Secondary | ICD-10-CM | POA: Diagnosis not present

## 2022-03-21 DIAGNOSIS — M5125 Other intervertebral disc displacement, thoracolumbar region: Secondary | ICD-10-CM | POA: Diagnosis not present

## 2022-03-22 DIAGNOSIS — M5125 Other intervertebral disc displacement, thoracolumbar region: Secondary | ICD-10-CM | POA: Diagnosis not present

## 2022-03-22 DIAGNOSIS — I10 Essential (primary) hypertension: Secondary | ICD-10-CM | POA: Diagnosis not present

## 2022-03-22 DIAGNOSIS — R262 Difficulty in walking, not elsewhere classified: Secondary | ICD-10-CM | POA: Diagnosis not present

## 2022-03-22 DIAGNOSIS — M25551 Pain in right hip: Secondary | ICD-10-CM | POA: Diagnosis not present

## 2022-03-26 DIAGNOSIS — M5125 Other intervertebral disc displacement, thoracolumbar region: Secondary | ICD-10-CM | POA: Diagnosis not present

## 2022-03-26 DIAGNOSIS — M25551 Pain in right hip: Secondary | ICD-10-CM | POA: Diagnosis not present

## 2022-03-26 DIAGNOSIS — R262 Difficulty in walking, not elsewhere classified: Secondary | ICD-10-CM | POA: Diagnosis not present

## 2022-03-28 DIAGNOSIS — R262 Difficulty in walking, not elsewhere classified: Secondary | ICD-10-CM | POA: Diagnosis not present

## 2022-03-28 DIAGNOSIS — M25551 Pain in right hip: Secondary | ICD-10-CM | POA: Diagnosis not present

## 2022-03-28 DIAGNOSIS — M5125 Other intervertebral disc displacement, thoracolumbar region: Secondary | ICD-10-CM | POA: Diagnosis not present

## 2022-04-04 DIAGNOSIS — M25551 Pain in right hip: Secondary | ICD-10-CM | POA: Diagnosis not present

## 2022-04-04 DIAGNOSIS — R262 Difficulty in walking, not elsewhere classified: Secondary | ICD-10-CM | POA: Diagnosis not present

## 2022-04-04 DIAGNOSIS — M5125 Other intervertebral disc displacement, thoracolumbar region: Secondary | ICD-10-CM | POA: Diagnosis not present

## 2022-04-06 ENCOUNTER — Telehealth: Payer: Self-pay | Admitting: *Deleted

## 2022-04-06 DIAGNOSIS — M25551 Pain in right hip: Secondary | ICD-10-CM | POA: Diagnosis not present

## 2022-04-06 DIAGNOSIS — R262 Difficulty in walking, not elsewhere classified: Secondary | ICD-10-CM | POA: Diagnosis not present

## 2022-04-06 DIAGNOSIS — I340 Nonrheumatic mitral (valve) insufficiency: Secondary | ICD-10-CM

## 2022-04-06 DIAGNOSIS — M5125 Other intervertebral disc displacement, thoracolumbar region: Secondary | ICD-10-CM | POA: Diagnosis not present

## 2022-04-06 DIAGNOSIS — I341 Nonrheumatic mitral (valve) prolapse: Secondary | ICD-10-CM

## 2022-04-06 NOTE — Telephone Encounter (Signed)
New order for echo to be done on this pt in Sept 2024 was placed.   Will send [facility] Scheduling dept a message that new order for echo is in, and they can now call the pt to get this scheduled for Sept 2024.

## 2022-04-06 NOTE — Telephone Encounter (Signed)
-----   Message from Delynn Flavin sent at 03/02/2022 12:21 PM EDT ----- Darcella Cheshire like [place] said the order will expire before then(they only last one year) so I would wait till sept 2023 to place these orders again so they do not expire when it comes time to schedule it.  ----- Message ----- From: [name], LPN Sent: 04/07/1114   7:19 AM EDT To: Frederic Jericho; [facility]  Pt needs repeat echo per Dr. Johney Frame in September 2024.  Order is in.  Can you please call and arrange and shoot me the date thereafter?  Thanks Aliyana Dlugosz    ----- Message ----- From: Antonieta Iba, RN Sent: 03/01/2022   3:47 PM EDT To: [name], LPN  Patient saw Dr. Johney Frame today, she will need an echocardiogram in September 2024. Orders not placed since they will expire before then.  Thanks! Carly

## 2022-04-10 NOTE — Telephone Encounter (Signed)
FW: schedule echo in Sept 2024 per Dr. Johney Frame Received: Today Alexis Heap, LPN Patient is scheduled 04/02/23 @ 2pm, order only went out 04/07/23 so we had back it up. Thanks!

## 2022-04-11 DIAGNOSIS — R197 Diarrhea, unspecified: Secondary | ICD-10-CM | POA: Diagnosis not present

## 2022-04-12 DIAGNOSIS — R197 Diarrhea, unspecified: Secondary | ICD-10-CM | POA: Diagnosis not present

## 2022-04-15 ENCOUNTER — Encounter (HOSPITAL_COMMUNITY): Payer: Self-pay

## 2022-04-15 ENCOUNTER — Emergency Department (HOSPITAL_COMMUNITY): Payer: PPO

## 2022-04-15 ENCOUNTER — Inpatient Hospital Stay (HOSPITAL_COMMUNITY)
Admission: EM | Admit: 2022-04-15 | Discharge: 2022-04-18 | DRG: 392 | Disposition: A | Discharge status: Home / Self Care | Payer: PPO | Attending: Family Medicine | Admitting: Family Medicine

## 2022-04-15 ENCOUNTER — Other Ambulatory Visit: Payer: Self-pay

## 2022-04-15 DIAGNOSIS — I2510 Atherosclerotic heart disease of native coronary artery without angina pectoris: Secondary | ICD-10-CM | POA: Diagnosis present

## 2022-04-15 DIAGNOSIS — Z79899 Other long term (current) drug therapy: Secondary | ICD-10-CM

## 2022-04-15 DIAGNOSIS — R112 Nausea with vomiting, unspecified: Secondary | ICD-10-CM

## 2022-04-15 DIAGNOSIS — R509 Fever, unspecified: Secondary | ICD-10-CM | POA: Diagnosis not present

## 2022-04-15 DIAGNOSIS — Z79890 Hormone replacement therapy (postmenopausal): Secondary | ICD-10-CM | POA: Diagnosis not present

## 2022-04-15 DIAGNOSIS — E785 Hyperlipidemia, unspecified: Secondary | ICD-10-CM | POA: Diagnosis not present

## 2022-04-15 DIAGNOSIS — Z800 Family history of malignant neoplasm of digestive organs: Secondary | ICD-10-CM | POA: Diagnosis not present

## 2022-04-15 DIAGNOSIS — K6389 Other specified diseases of intestine: Secondary | ICD-10-CM | POA: Diagnosis not present

## 2022-04-15 DIAGNOSIS — K529 Noninfective gastroenteritis and colitis, unspecified: Principal | ICD-10-CM | POA: Diagnosis present

## 2022-04-15 DIAGNOSIS — R197 Diarrhea, unspecified: Secondary | ICD-10-CM

## 2022-04-15 DIAGNOSIS — I10 Essential (primary) hypertension: Secondary | ICD-10-CM | POA: Diagnosis not present

## 2022-04-15 DIAGNOSIS — I252 Old myocardial infarction: Secondary | ICD-10-CM | POA: Diagnosis not present

## 2022-04-15 DIAGNOSIS — I129 Hypertensive chronic kidney disease with stage 1 through stage 4 chronic kidney disease, or unspecified chronic kidney disease: Secondary | ICD-10-CM | POA: Diagnosis present

## 2022-04-15 DIAGNOSIS — Z85828 Personal history of other malignant neoplasm of skin: Secondary | ICD-10-CM

## 2022-04-15 DIAGNOSIS — E860 Dehydration: Secondary | ICD-10-CM | POA: Diagnosis not present

## 2022-04-15 DIAGNOSIS — Z803 Family history of malignant neoplasm of breast: Secondary | ICD-10-CM | POA: Diagnosis not present

## 2022-04-15 DIAGNOSIS — I959 Hypotension, unspecified: Secondary | ICD-10-CM | POA: Diagnosis not present

## 2022-04-15 DIAGNOSIS — E871 Hypo-osmolality and hyponatremia: Secondary | ICD-10-CM | POA: Diagnosis present

## 2022-04-15 DIAGNOSIS — Z791 Long term (current) use of non-steroidal anti-inflammatories (NSAID): Secondary | ICD-10-CM | POA: Diagnosis not present

## 2022-04-15 DIAGNOSIS — E0500 Thyrotoxicosis with diffuse goiter without thyrotoxic crisis or storm: Secondary | ICD-10-CM | POA: Diagnosis not present

## 2022-04-15 DIAGNOSIS — Z823 Family history of stroke: Secondary | ICD-10-CM

## 2022-04-15 DIAGNOSIS — R42 Dizziness and giddiness: Secondary | ICD-10-CM | POA: Diagnosis not present

## 2022-04-15 DIAGNOSIS — R55 Syncope and collapse: Secondary | ICD-10-CM | POA: Diagnosis not present

## 2022-04-15 DIAGNOSIS — R0902 Hypoxemia: Secondary | ICD-10-CM | POA: Diagnosis not present

## 2022-04-15 DIAGNOSIS — E876 Hypokalemia: Secondary | ICD-10-CM | POA: Diagnosis present

## 2022-04-15 DIAGNOSIS — Z8249 Family history of ischemic heart disease and other diseases of the circulatory system: Secondary | ICD-10-CM

## 2022-04-15 DIAGNOSIS — I700 Atherosclerosis of aorta: Secondary | ICD-10-CM | POA: Diagnosis not present

## 2022-04-15 DIAGNOSIS — Z96651 Presence of right artificial knee joint: Secondary | ICD-10-CM | POA: Diagnosis present

## 2022-04-15 DIAGNOSIS — N1831 Chronic kidney disease, stage 3a: Secondary | ICD-10-CM | POA: Diagnosis not present

## 2022-04-15 LAB — CBC WITH DIFFERENTIAL/PLATELET
Abs Immature Granulocytes: 0.02 10*3/uL (ref 0.00–0.07)
Basophils Absolute: 0.1 10*3/uL (ref 0.0–0.1)
Basophils Relative: 1 %
Eosinophils Absolute: 0.2 10*3/uL (ref 0.0–0.5)
Eosinophils Relative: 2 %
HCT: 37.3 % (ref 36.0–46.0)
Hemoglobin: 11.9 g/dL — ABNORMAL LOW (ref 12.0–15.0)
Immature Granulocytes: 0 %
Lymphocytes Relative: 15 %
Lymphs Abs: 1.1 10*3/uL (ref 0.7–4.0)
MCH: 30.3 pg (ref 26.0–34.0)
MCHC: 31.9 g/dL (ref 30.0–36.0)
MCV: 94.9 fL (ref 80.0–100.0)
Monocytes Absolute: 1.2 10*3/uL — ABNORMAL HIGH (ref 0.1–1.0)
Monocytes Relative: 17 %
Neutro Abs: 4.8 10*3/uL (ref 1.7–7.7)
Neutrophils Relative %: 65 %
Platelets: 233 10*3/uL (ref 150–400)
RBC: 3.93 MIL/uL (ref 3.87–5.11)
RDW: 11.9 % (ref 11.5–15.5)
WBC: 7.4 10*3/uL (ref 4.0–10.5)
nRBC: 0 % (ref 0.0–0.2)

## 2022-04-15 LAB — COMPREHENSIVE METABOLIC PANEL
ALT: 8 U/L (ref 0–44)
AST: 17 U/L (ref 15–41)
Albumin: 3.2 g/dL — ABNORMAL LOW (ref 3.5–5.0)
Alkaline Phosphatase: 48 U/L (ref 38–126)
Anion gap: 13 (ref 5–15)
BUN: 25 mg/dL — ABNORMAL HIGH (ref 8–23)
CO2: 18 mmol/L — ABNORMAL LOW (ref 22–32)
Calcium: 9.4 mg/dL (ref 8.9–10.3)
Chloride: 98 mmol/L (ref 98–111)
Creatinine, Ser: 1.22 mg/dL — ABNORMAL HIGH (ref 0.44–1.00)
GFR, Estimated: 44 mL/min — ABNORMAL LOW (ref >60)
Glucose, Bld: 101 mg/dL — ABNORMAL HIGH (ref 70–99)
Potassium: 3.2 mmol/L — ABNORMAL LOW (ref 3.5–5.1)
Sodium: 129 mmol/L — ABNORMAL LOW (ref 135–145)
Total Bilirubin: 1 mg/dL (ref 0.3–1.2)
Total Protein: 6.2 g/dL — ABNORMAL LOW (ref 6.5–8.1)

## 2022-04-15 LAB — LACTIC ACID, PLASMA
Lactic Acid, Venous: 2.2 mmol/L (ref 0.5–1.9)
Lactic Acid, Venous: 1.1 mmol/L (ref 0.5–1.9)

## 2022-04-15 LAB — MAGNESIUM: Magnesium: 1.5 mg/dL — ABNORMAL LOW (ref 1.7–2.4)

## 2022-04-15 MED ORDER — ACETAMINOPHEN 650 MG RE SUPP: 650.0000 mg | Freq: Four times a day (QID) | RECTAL | Status: DC | PRN

## 2022-04-15 MED ORDER — ACETAMINOPHEN 325 MG PO TABS
650.0000 mg | ORAL_TABLET | Freq: Four times a day (QID) | ORAL | Status: DC | PRN
  Administered 2022-04-16: 650 mg via ORAL
  Filled 2022-04-15: qty 2

## 2022-04-15 MED ORDER — POTASSIUM CHLORIDE IN NACL 20-0.9 MEQ/L-% IV SOLN
Freq: Once | INTRAVENOUS | Status: AC
  Filled 2022-04-15: qty 1000

## 2022-04-15 MED ORDER — METRONIDAZOLE 500 MG/100ML IV SOLN
500.0000 mg | Freq: Once | INTRAVENOUS | Status: AC
Start: 2022-04-15 — End: 2022-04-15
  Administered 2022-04-15: 500 mg via INTRAVENOUS
  Filled 2022-04-15: qty 100

## 2022-04-15 MED ORDER — SODIUM CHLORIDE 0.9 % IV BOLUS
1000.0000 mL | Freq: Once | INTRAVENOUS | Status: AC
  Administered 2022-04-15: 1000 mL via INTRAVENOUS

## 2022-04-15 MED ORDER — POTASSIUM CHLORIDE IN NACL 20-0.9 MEQ/L-% IV SOLN
INTRAVENOUS | Status: AC
  Filled 2022-04-15: qty 1000

## 2022-04-15 MED ORDER — CIPROFLOXACIN IN D5W 400 MG/200ML IV SOLN: 400.0000 mg | Freq: Once | INTRAVENOUS | Status: DC

## 2022-04-15 MED ORDER — ATORVASTATIN CALCIUM 20 MG PO TABS
20.0000 mg | ORAL_TABLET | Freq: Every day | ORAL | Status: DC
  Administered 2022-04-16 – 2022-04-17 (×2): 20 mg via ORAL
  Filled 2022-04-15 (×3): qty 1

## 2022-04-15 MED ORDER — ENOXAPARIN SODIUM 40 MG/0.4ML IJ SOSY
40.0000 mg | PREFILLED_SYRINGE | INTRAMUSCULAR | Status: DC
  Administered 2022-04-15 – 2022-04-17 (×3): 40 mg via SUBCUTANEOUS
  Filled 2022-04-15 (×3): qty 0.4

## 2022-04-15 MED ORDER — IOHEXOL 300 MG/ML  SOLN
100.0000 mL | Freq: Once | INTRAMUSCULAR | Status: AC | PRN
  Administered 2022-04-15: 100 mL via INTRAVENOUS

## 2022-04-15 MED ORDER — METRONIDAZOLE 500 MG/100ML IV SOLN
500.0000 mg | Freq: Two times a day (BID) | INTRAVENOUS | Status: DC
  Administered 2022-04-16 – 2022-04-18 (×5): 500 mg via INTRAVENOUS
  Filled 2022-04-15 (×5): qty 100

## 2022-04-15 MED ORDER — MAGNESIUM SULFATE 2 GM/50ML IV SOLN
2.0000 g | Freq: Once | INTRAVENOUS | Status: AC
Start: 2022-04-15 — End: 2022-04-15
  Administered 2022-04-15: 2 g via INTRAVENOUS
  Filled 2022-04-15: qty 50

## 2022-04-15 MED ORDER — SODIUM CHLORIDE 0.9 % IV SOLN
2.0000 g | INTRAVENOUS | Status: DC
  Administered 2022-04-15 – 2022-04-17 (×3): 2 g via INTRAVENOUS
  Filled 2022-04-15 (×3): qty 20

## 2022-04-15 MED ORDER — LEVOTHYROXINE SODIUM 50 MCG PO TABS
75.0000 ug | ORAL_TABLET | Freq: Every day | ORAL | Status: DC
  Administered 2022-04-16 – 2022-04-18 (×3): 75 ug via ORAL
  Filled 2022-04-15 (×3): qty 1

## 2022-04-15 MED ORDER — PANTOPRAZOLE SODIUM 40 MG PO TBEC
40.0000 mg | DELAYED_RELEASE_TABLET | Freq: Every day | ORAL | Status: DC
  Administered 2022-04-16 – 2022-04-18 (×3): 40 mg via ORAL
  Filled 2022-04-15 (×3): qty 1

## 2022-04-15 MED ORDER — SODIUM CHLORIDE 0.9% FLUSH
3.0000 mL | Freq: Two times a day (BID) | INTRAVENOUS | Status: DC
  Administered 2022-04-15 – 2022-04-18 (×6): 3 mL via INTRAVENOUS

## 2022-04-15 NOTE — ED Triage Notes (Addendum)
Pt arrived via EMS, from home, c/o diarrhea x3 wks. Nausea and vomiting today. Also c/o fevers up to 103 at home. Hypotensive on arrival 44P systolic  84K L AC  350 cc NS  4 mg zofran given enroute

## 2022-04-15 NOTE — H&P (Signed)
History and Physical    [name] DOB: 1941-02-09 DOA: 04/15/2022  PCP: Deland Pretty, MD   Patient coming from: Home   Chief Complaint: N/V/D   HPI: [name] is a 81 y.o. 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  Patient reports that she began to have loose stools every morning approximately 3 weeks ago, started taking Imodium little over a week ago, was prescribed cholestyramine a few days ago, and then developed worsening diarrhea as well as nausea with nonbloody vomiting.  She reports having a fever at home yesterday.  She denies any travel or sick contacts.  She denies any abdominal pain.  ED Course: Upon arrival to the ED, patient is found to be afebrile and saturating well on room air with systolic blood pressure of 91 and greater.  Chemistry panel is notable for sodium 129, potassium 3.2, creatinine 1.22, and magnesium 1.5.  Lactic acid was 2.2.  She was treated with 1.5 L of normal saline, 10 mill equivalents IV potassium, and antibiotics in the ED.  Review of Systems:  All other systems reviewed and apart from HPI, are negative.  Past Medical History:  Diagnosis Date   Abnormal EKG    Atrial fibrillation (HCC)    Basal cell carcinoma of nose    removed w/MOHs   CAD (coronary artery disease)    a. 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 EF 30-35% at [facility] 06/2013;  b.  f/u Echo (06/18/13):  EF 60-65%, normal wall motion, Gr 1 DD, mild MVP of post leaflet, mod TR, PASP 63   Thyroid disease    Tricuspid regurgitation    mild to moderate, echo, January, 2011, [place] pressure 38 mm mercury    Past Surgical History:  Procedure Laterality Date   CARDIAC CATHETERIZATION  06/17/2013   COLONOSCOPY  12/23/2003   normal (indications: prior adenomas and grandfather  with colon cancer)   COLPOSCOPY     EXCISIONAL HEMORRHOIDECTOMY  2003   LEFT HEART CATHETERIZATION WITH CORONARY ANGIOGRAM N/A 06/17/2013   Procedure: LEFT HEART CATHETERIZATION WITH CORONARY ANGIOGRAM;  Surgeon: Peter M Martinique, MD;  Location: [facility] CATH LAB;  Service: Cardiovascular;  Laterality: N/A;   MOHS SURGERY Left 2011   "side of my nose" (06/17/2013)   TONSILLECTOMY     TOTAL KNEE ARTHROPLASTY  08/2010   TOTAL KNEE ARTHROPLASTY  02/25/2012   Procedure: TOTAL KNEE ARTHROPLASTY;  Surgeon: Gearlean Alf, MD;  Location: [facility] ORS;  Service: Orthopedics;  Laterality: Right;    Social History:   reports that she has never smoked. She has never used smokeless tobacco. She reports current  alcohol use. She reports that she does not use drugs.  No Known Allergies  Family History  Problem Relation Age of Onset   Heart attack Mother 83   Hypertension Mother    Heart disease Mother    Stroke Mother    Heart attack Father 2   Coronary artery disease Father        Multiple Family Members   Hypertension Father    Heart disease Father    Esophageal cancer Paternal Grandmother    Colon cancer Maternal Grandfather    Heart disease Maternal Grandfather    Irritable bowel syndrome Sister        More family members on father side of    Hypertension Sister    Heart disease Sister    Breast cancer Sister 40   Breast cancer Paternal Aunt        Age 43's   Heart disease Paternal Grandfather    Rectal cancer Neg Hx    Stomach cancer Neg Hx      Prior to Admission medications   Medication Sig Start Date End Date Taking? Authorizing Provider  ALPHA LIPOIC ACID PO Take by mouth.   Yes [provider]  amLODipine (NORVASC) 5 MG tablet Take 1 tablet (5 mg total) by mouth daily. 03/01/22  Yes Freada Bergeron, MD  atorvastatin (LIPITOR) 20 MG tablet Take 1 tablet (20 mg total) by mouth daily. 03/01/22  Yes Freada Bergeron, MD  Biotin 5000 MCG CAPS Take 1 capsule by mouth daily.    Yes [provider]  Calcium Carbonate (CALTRATE 600 PO) Take 1 tablet by mouth 2 (two) times a day.    Yes [provider]  Cholecalciferol 25 MCG (1000 UT) capsule Take 1,000 Units by mouth daily.   Yes [provider]  cholestyramine light (PREVALITE) 4 GM/DOSE powder Take 1 packet by mouth in the morning, at noon, in the evening, and at bedtime. 04/11/22  Yes [provider]  denosumab (PROLIA) 60 MG/ML SOSY injection Inject 60 mg into the skin every 6 (six) months.   Yes [provider]  diclofenac sodium (VOLTAREN) 1 % GEL 1 application. 04/16/18  Yes [provider]  enalapril (VASOTEC) 20 MG tablet Take 1 tablet (20 mg total) by  mouth 2 (two) times daily. 03/01/22  Yes Pemberton, Greer Ee, MD  EVENING PRIMROSE OIL PO Take 1 capsule by mouth 2 (two) times a day.    Yes [provider]  gabapentin (NEURONTIN) 300 MG capsule Take 3 capsules (900 mg total) by mouth 3 (three) times daily. 12/11/21  Yes Melvenia Beam, MD  hydrALAZINE (APRESOLINE) 50 MG tablet Take 1 tablet (50 mg total) by mouth 2 (two) times daily. 03/01/22  Yes Gwyndolyn Kaufman  E, MD  hydrochlorothiazide (HYDRODIURIL) 25 MG tablet Take 1 tablet (25 mg total) by mouth daily. 03/01/22  Yes Freada Bergeron, MD  levothyroxine (SYNTHROID) 75 MCG tablet TAKE 1 TABLET BY MOUTH EVERY DAY 01/23/22  Yes Elayne Snare, MD  meloxicam (MOBIC) 15 MG tablet Take 15 mg by mouth daily.   Yes [provider]  methylcellulose packet Take 1 each by mouth daily. CITRICEL   Yes [provider]  metroNIDAZOLE (METROCREAM) 0.75 % cream Apply 1 application topically 2 (two) times daily.   Yes [provider]  NONFORMULARY OR COMPOUNDED ITEM Compound cream: Meloxicam 0.5% Doxepin 3% Amantadine 3% Dextromethorphan 2% Lidocaine 2%  Sig: Apply 1-2 grams to the affected area 3-4 times daily. Dispense 240 grams with 5 refills.   Yes Melvenia Beam, MD  pantoprazole (PROTONIX) 40 MG tablet Take 1 tablet by mouth daily. 11/23/15  Yes [provider]  tretinoin (RETIN-A) 0.05 % cream Apply 1 application topically at bedtime.   Yes [provider]  zolpidem (AMBIEN) 5 MG tablet Take 5 mg by mouth at bedtime as needed for sleep.   Yes [provider]    Physical Exam: Vitals:   04/15/22 1645 04/15/22 1745 04/15/22 1845 04/15/22 1900  BP: 91/76 (!) 113/59 (!) 97/48 (!) 111/54  Pulse: 86 80 87 92  Resp: '19 17 14 17  '$ Temp:      TempSrc:      SpO2: 98% 96% 94% 95%    Constitutional: NAD, no diaphoresis or pallor   Eyes: PERTLA, lids and conjunctivae normal ENMT: Mucous membranes are moist. Posterior pharynx clear of  any exudate or lesions.   Neck: supple, no masses  Respiratory: no wheezing, no crackles. No accessory muscle use.  Cardiovascular: S1 & S2 heard, regular rate and rhythm. No extremity edema.   Abdomen: No distension, no tenderness, soft. Bowel sounds active.  Musculoskeletal: no clubbing / cyanosis. No joint deformity upper and lower extremities.   Skin: no significant rashes, lesions, ulcers. Warm, dry, well-perfused. Neurologic: CN 2-12 grossly intact. Moving all extremities. Alert and oriented.  Psychiatric: Calm. Cooperative.    Labs and Imaging on Admission: I have personally reviewed following labs and imaging studies  CBC: Recent Labs  Lab 04/15/22 1651  WBC 7.4  NEUTROABS 4.8  HGB 11.9*  HCT 37.3  MCV 94.9  PLT 712   Basic Metabolic Panel: Recent Labs  Lab 04/15/22 1651  NA 129*  K 3.2*  CL 98  CO2 18*  GLUCOSE 101*  BUN 25*  CREATININE 1.22*  CALCIUM 9.4  MG 1.5*   GFR: CrCl cannot be calculated (Unknown ideal weight.). Liver Function Tests: Recent Labs  Lab 04/15/22 1651  AST 17  ALT 8  ALKPHOS 48  BILITOT 1.0  PROT 6.2*  ALBUMIN 3.2*   No results for input(s): "LIPASE", "AMYLASE" in the last 168 hours. No results for input(s): "AMMONIA" in the last 168 hours. Coagulation Profile: No results for input(s): "INR", "PROTIME" in the last 168 hours. Cardiac Enzymes: No results for input(s): "CKTOTAL", "CKMB", "CKMBINDEX", "TROPONINI" in the last 168 hours. BNP (last 3 results) No results for input(s): "PROBNP" in the last 8760 hours. HbA1C: No results for input(s): "HGBA1C" in the last 72 hours. CBG: No results for input(s): "GLUCAP" in the last 168 hours. Lipid Profile: No results for input(s): "CHOL", "HDL", "LDLCALC", "TRIG", "CHOLHDL", "LDLDIRECT" in the last 72 hours. Thyroid Function Tests: No results for input(s): "TSH", "T4TOTAL", "FREET4", "T3FREE", "THYROIDAB" in the last  72 hours. Anemia Panel: No results for input(s): "VITAMINB12",  "FOLATE", "FERRITIN", "TIBC", "IRON", "RETICCTPCT" in the last 72 hours. Urine analysis:    Component Value Date/Time   COLORURINE YELLOW 07/29/2013 1345   APPEARANCEUR CLEAR 07/29/2013 1345   LABSPEC 1.007 07/29/2013 1345   PHURINE 6.0 07/29/2013 1345   GLUCOSEU NEG 07/29/2013 1345   HGBUR NEG 07/29/2013 1345   BILIRUBINUR NEG 07/29/2013 1345   BILIRUBINUR n 12/05/2012 1431   KETONESUR NEG 07/29/2013 1345   PROTEINUR NEG 07/29/2013 1345   UROBILINOGEN 0.2 07/29/2013 1345   NITRITE NEG 07/29/2013 1345   LEUKOCYTESUR NEG 07/29/2013 1345   Sepsis Labs: '@LABRCNTIP'$ (procalcitonin:4,lacticidven:4) )No results found for this or any previous visit (from the past 240 hour(s)).   Radiological Exams on Admission: CT Abdomen Pelvis W Contrast  Result Date: 04/15/2022 CLINICAL DATA:  Nausea and vomiting with abdominal pain. EXAM: CT ABDOMEN AND PELVIS WITH CONTRAST TECHNIQUE: Multidetector CT imaging of the abdomen and pelvis was performed using the standard protocol following bolus administration of intravenous contrast. RADIATION DOSE REDUCTION: This exam was performed according to the departmental dose-optimization program which includes automated exposure control, adjustment of the mA and/or kV according to patient size and/or use of iterative reconstruction technique. CONTRAST:  114m OMNIPAQUE IOHEXOL 300 MG/ML  SOLN COMPARISON:  None Available. FINDINGS: Lower chest: No acute abnormality. Hepatobiliary: There are a few scattered rounded hypodensities throughout the liver which are too small to characterize measuring up to 1 cm. These are favored as small cysts or hemangiomas. The gallbladder and bile ducts are within normal limits. Pancreas: Unremarkable. No pancreatic ductal dilatation or surrounding inflammatory changes. Spleen: Normal in size without focal abnormality. Adrenals/Urinary Tract: Adrenal glands are unremarkable. Kidneys are normal, without renal calculi, focal lesion, or  hydronephrosis. Bladder is unremarkable. Stomach/Bowel: There is mild diffuse colonic wall thickening. No evidence for bowel obstruction, pneumatosis or free air. Appendix is not seen. Small bowel is within normal limits. The stomach is decompressed. There is some questionable gastric antral wall thickening. Vascular/Lymphatic: Aortic atherosclerosis. No enlarged abdominal or pelvic lymph nodes. Reproductive: Uterus and bilateral adnexa are unremarkable. Other: There is trace free fluid in the pelvis. No abdominal wall hernia. Musculoskeletal: Severe degenerative changes affect the spine. There are chronic compression fractures of T12 and L4. IMPRESSION: 1. Mild diffuse colonic wall thickening worrisome for nonspecific colitis. 2. Questionable gastric antral wall thickening. Correlate clinically for gastritis or ulcer disease. 3. Small amount of free fluid in the pelvis. 4. Rounded hypodensities in the liver are too small to characterize, likely cysts or hemangiomas. These can be further evaluated with MRI if clinically warranted. 5.  Aortic Atherosclerosis (ICD10-I70.0). Electronically Signed   By: ARonney AstersM.D.   On: 04/15/2022 18:48     Assessment/Plan   1. Colitis  - Presents with 3 wks of loose stools and more recent N/V and fever, and is found to have non-specific colitis on CT  - She was given IVF and antibiotics in ED  - Check GI pathogen panel and C diff, continue IVF and electrolyte replacement, advance diet as tolerated    2. Hyponatremia; hypokalemia; hypomagnesemia  - Serum sodium is 129, potassium 3.2, and magnesium 1.5 in setting of N/V/D with dehydration and HCTZ use  - Hold HCTZ, continue NS infusion, supplement potassium and magnesium, repeat chemistries in am    3. CKD IIIa  - SCr is 1.22 on admission, up from 0.97 in 2019  - Hold ACE and HCTZ, continue IVF hydration, repeat chem  panel in am    4. HTN  - SBP was 80s initially and antihypertensives held on admission    5.  HLD  - Continue Lipitor     DVT prophylaxis: Lovenox  Code Status: Full  Level of Care: Level of care: Telemetry Family Communication: daughter at bedside Disposition Plan:  Patient is from: Home  Anticipated d/c is to: home Anticipated d/c date is: 9/11 or 04/17/22  Patient currently: Pending correction of electrolyte abnormalities, tolerance of adequate oral intake  Consults called: None  Admission status: Observation     Vianne Bulls, MD [facility]  04/15/2022, 8:22 PM

## 2022-04-15 NOTE — ED Provider Notes (Signed)
[name]   CSN: [id number] Arrival date & time: 04/15/22  1600     History  Chief Complaint  Patient presents with   Diarrhea    [name] is a 81 y.o. female.  HPI Patient presents via EMS with concern for nausea, vomiting, diarrhea.  She has had loose stool every morning for the past 3 weeks, but was otherwise generally well until a few days ago.  She saw her physician, started a new medication, and since that time has had more frequent and looser stool.  In addition, today the patient developed nausea, vomiting and fever.  Abdominal discomfort is an unsettled sensation more than focal pain.  There may have been an episode of confusion or syncope today, the patient is unsure of this.  EMS reports the patient was hypotensive on arrival, improved with IV fluids.    Home Medications Prior to Admission medications   Medication Sig Start Date End Date Taking? Authorizing Provider  ALPHA LIPOIC ACID PO Take by mouth.    [provider]  amLODipine (NORVASC) 5 MG tablet Take 1 tablet (5 mg total) by mouth daily. 03/01/22   Freada Bergeron, MD  atorvastatin (LIPITOR) 20 MG tablet Take 1 tablet (20 mg total) by mouth daily. 03/01/22   Freada Bergeron, MD  Biotin 5000 MCG CAPS Take 1 capsule by mouth daily.     [provider]  Calcium Carbonate (CALTRATE 600 PO) Take 1 tablet by mouth 2 (two) times a day.     [provider]  Cholecalciferol 25 MCG (1000 UT) capsule Take 1,000 Units by mouth daily.    [provider]  denosumab (PROLIA) 60 MG/ML SOSY injection Inject 60 mg into the skin every 6 (six) months.    [provider]  diclofenac sodium (VOLTAREN) 1 % GEL 1 application. 04/16/18   [provider]  enalapril (VASOTEC) 20 MG tablet Take 1 tablet (20 mg total) by mouth 2 (two) times daily. 03/01/22   Freada Bergeron, MD  EVENING PRIMROSE OIL PO Take 1 capsule  by mouth 2 (two) times a day.     [provider]  gabapentin (NEURONTIN) 300 MG capsule Take 3 capsules (900 mg total) by mouth 3 (three) times daily. 12/11/21   Melvenia Beam, MD  hydrALAZINE (APRESOLINE) 50 MG tablet Take 1 tablet (50 mg total) by mouth 2 (two) times daily. 03/01/22   Freada Bergeron, MD  hydrochlorothiazide (HYDRODIURIL) 25 MG tablet Take 1 tablet (25 mg total) by mouth daily. 03/01/22   Freada Bergeron, MD  levothyroxine (SYNTHROID) 75 MCG tablet TAKE 1 TABLET BY MOUTH EVERY DAY 01/23/22   Elayne Snare, MD  meloxicam (MOBIC) 15 MG tablet Take 15 mg by mouth daily.    [provider]  methylcellulose packet Take 1 each by mouth daily. CITRICEL    [provider]  metroNIDAZOLE (METROCREAM) 0.75 % cream Apply 1 application topically 2 (two) times daily.    [provider]  NONFORMULARY OR COMPOUNDED ITEM Compound cream: Meloxicam 0.5% Doxepin 3% Amantadine 3% Dextromethorphan 2% Lidocaine 2%  Sig: Apply 1-2 grams to the affected area 3-4 times daily. Dispense 240 grams with 5 refills.    Melvenia Beam, MD  pantoprazole (PROTONIX) 40 MG tablet Take 1 tablet by mouth daily. 11/23/15   [provider]  tretinoin (RETIN-A) 0.05 % cream Apply 1 application topically at bedtime.    [provider]  zolpidem (AMBIEN) 5 MG tablet Take 5 mg by mouth at bedtime as needed for sleep.    [provider]      Allergies    Patient has no known allergies.    Review of Systems   Review of Systems  All other systems reviewed and are negative.   Physical Exam Updated Vital Signs BP (!) 111/54   Pulse 92   Temp 98.2 F (36.8 C) (Oral)   Resp 17   SpO2 95%  Physical Exam Vitals and nursing note reviewed.  Constitutional:      General: She is not in acute distress.    Appearance: She is well-developed.  HENT:     Head: Normocephalic and atraumatic.  Eyes:     Conjunctiva/sclera: Conjunctivae normal.   Cardiovascular:     Rate and Rhythm: Normal rate and regular rhythm.  Pulmonary:     Effort: Pulmonary effort is normal. No respiratory distress.     Breath sounds: Normal breath sounds. No stridor.  Abdominal:     General: There is no distension.     Comments: Mild discomfort throughout, no peritoneal findings  Skin:    General: Skin is warm and dry.  Neurological:     Mental Status: She is alert and oriented to person, place, and time.     Cranial Nerves: No cranial nerve deficit.  Psychiatric:        Mood and Affect: Mood normal.     ED Results / Procedures / Treatments   Labs (all labs ordered are listed, but only abnormal results are displayed) Labs Reviewed  COMPREHENSIVE METABOLIC PANEL - Abnormal; Notable for the following components:      Result Value   Sodium 129 (*)    Potassium 3.2 (*)    CO2 18 (*)    Glucose, Bld 101 (*)    BUN 25 (*)    Creatinine, Ser 1.22 (*)    Total Protein 6.2 (*)    Albumin 3.2 (*)    GFR, Estimated 44 (*)    All other components within normal limits  LACTIC ACID, PLASMA - Abnormal; Notable for the following components:   Lactic Acid, Venous 2.2 (*)    All other components within normal limits  CBC WITH DIFFERENTIAL/PLATELET - Abnormal; Notable for the following components:   Hemoglobin 11.9 (*)    Monocytes Absolute 1.2 (*)    All other components within normal limits  MAGNESIUM - Abnormal; Notable for the following components:   Magnesium 1.5 (*)    All other components within normal limits  ETHANOL  LACTIC ACID, PLASMA    EKG None  Radiology CT Abdomen Pelvis W Contrast  Result Date: 04/15/2022 CLINICAL DATA:  Nausea and vomiting with abdominal pain. EXAM: CT ABDOMEN AND PELVIS WITH CONTRAST TECHNIQUE: Multidetector CT imaging of the abdomen and pelvis was performed using the standard protocol following bolus administration of intravenous contrast. RADIATION DOSE REDUCTION: This exam was performed according to the  departmental dose-optimization program which includes automated exposure control, adjustment of the mA and/or kV according to patient size and/or use of iterative reconstruction technique. CONTRAST:  120m OMNIPAQUE IOHEXOL 300 MG/ML  SOLN COMPARISON:  None Available. FINDINGS: Lower chest: No acute abnormality. Hepatobiliary: There are a few scattered rounded hypodensities throughout the liver which are too small to characterize measuring up to 1 cm. These are favored as small cysts or hemangiomas. The gallbladder and bile ducts are within normal limits. Pancreas: Unremarkable. No pancreatic ductal dilatation or surrounding  inflammatory changes. Spleen: Normal in size without focal abnormality. Adrenals/Urinary Tract: Adrenal glands are unremarkable. Kidneys are normal, without renal calculi, focal lesion, or hydronephrosis. Bladder is unremarkable. Stomach/Bowel: There is mild diffuse colonic wall thickening. No evidence for bowel obstruction, pneumatosis or free air. Appendix is not seen. Small bowel is within normal limits. The stomach is decompressed. There is some questionable gastric antral wall thickening. Vascular/Lymphatic: Aortic atherosclerosis. No enlarged abdominal or pelvic lymph nodes. Reproductive: Uterus and bilateral adnexa are unremarkable. Other: There is trace free fluid in the pelvis. No abdominal wall hernia. Musculoskeletal: Severe degenerative changes affect the spine. There are chronic compression fractures of T12 and L4. IMPRESSION: 1. Mild diffuse colonic wall thickening worrisome for nonspecific colitis. 2. Questionable gastric antral wall thickening. Correlate clinically for gastritis or ulcer disease. 3. Small amount of free fluid in the pelvis. 4. Rounded hypodensities in the liver are too small to characterize, likely cysts or hemangiomas. These can be further evaluated with MRI if clinically warranted. 5.  Aortic Atherosclerosis (ICD10-I70.0). Electronically Signed   By: Ronney Asters M.D.   On: 04/15/2022 18:48    Procedures Procedures    Medications Ordered in ED Medications  0.9 % NaCl with KCl 20 mEq/ L  infusion ( Intravenous New Bag/Given 04/15/22 1902)  ciprofloxacin (CIPRO) IVPB 400 mg (has no administration in time range)  metroNIDAZOLE (FLAGYL) IVPB 500 mg (has no administration in time range)  sodium chloride 0.9 % bolus 1,000 mL (0 mLs Intravenous Stopped 04/15/22 1902)  iohexol (OMNIPAQUE) 300 MG/ML solution 100 mL (100 mLs Intravenous Contrast Given 04/15/22 1800)    ED Course/ Medical Decision Making/ A&P This patient with a Hx of IBS, hypertension presents to the ED for concern of diarrhea, now with associated nausea, vomiting, fever, this involves an extensive number of treatment options, and is a complaint that carries with it a high risk of complications and morbidity.    The differential diagnosis includes IBS flare, colitis, bacteremia, sepsis, dehydration, electrolyte abnormalities   Social Determinants of Health:  No limiting factors  Additional history obtained:  Additional history and/or information obtained from EMS, notable for details of transport included above.  In addition I reviewed the patient's EMS rhythm strip, notable for sinus rhythm, rate 87, first-degree block, otherwise unremarkable.   After the initial evaluation, orders, including: Fluids with potassium repletion IV were initiated.   Patient placed on Cardiac and Pulse-Oximetry Monitors. The patient was maintained on a cardiac monitor.  The cardiac monitored showed an rhythm of 90 sinus normal The patient was also maintained on pulse oximetry. The readings were typically 100% room air normal   On repeat evaluation of the patient improved.  Initially the patient's blood pressure decreased, with MAP less than 60, but with ongoing fluid resuscitation the patient's blood pressure then improved.  Lab Tests:  I personally interpreted labs.  The pertinent results  include: Hypokalemia, hyponatremia, elevated creatinine compared to baseline lactic acidosis noted  Imaging Studies ordered:  I independently visualized and interpreted imaging which showed colitis on CT I agree with the radiologist interpretation   Dispostion / Final MDM:  After consideration of the diagnostic results and the patient's response to treatment, this adult female with history of IBS, though no recent episodes, no ongoing medications no recent steroid use, nor antibiotic use presents with ongoing diarrhea, new nausea, vomiting, p.o. intolerance, is found to have multiple lab abnormalities including lactic acidosis, hypokalemia, hyponatremia, and elevated creatinine.  Patient received fluid resuscitation, potassium repletion,  and was started on antibiotics, though colitis secondary to IBS with inflammation is a noted consideration. Given his multiple abnormality, advanced age, the patient was admitted for further monitoring, management.  Final Clinical Impression(s) / ED Diagnoses Final diagnoses:  Colitis     Carmin Muskrat, MD 04/15/22 [facility]

## 2022-04-16 DIAGNOSIS — I129 Hypertensive chronic kidney disease with stage 1 through stage 4 chronic kidney disease, or unspecified chronic kidney disease: Secondary | ICD-10-CM | POA: Diagnosis present

## 2022-04-16 DIAGNOSIS — E876 Hypokalemia: Secondary | ICD-10-CM | POA: Diagnosis present

## 2022-04-16 DIAGNOSIS — Z85828 Personal history of other malignant neoplasm of skin: Secondary | ICD-10-CM | POA: Diagnosis not present

## 2022-04-16 DIAGNOSIS — Z823 Family history of stroke: Secondary | ICD-10-CM | POA: Diagnosis not present

## 2022-04-16 DIAGNOSIS — Z79890 Hormone replacement therapy (postmenopausal): Secondary | ICD-10-CM | POA: Diagnosis not present

## 2022-04-16 DIAGNOSIS — E0500 Thyrotoxicosis with diffuse goiter without thyrotoxic crisis or storm: Secondary | ICD-10-CM | POA: Diagnosis present

## 2022-04-16 DIAGNOSIS — Z800 Family history of malignant neoplasm of digestive organs: Secondary | ICD-10-CM | POA: Diagnosis not present

## 2022-04-16 DIAGNOSIS — Z96651 Presence of right artificial knee joint: Secondary | ICD-10-CM | POA: Diagnosis present

## 2022-04-16 DIAGNOSIS — R112 Nausea with vomiting, unspecified: Secondary | ICD-10-CM | POA: Diagnosis not present

## 2022-04-16 DIAGNOSIS — R197 Diarrhea, unspecified: Secondary | ICD-10-CM | POA: Diagnosis not present

## 2022-04-16 DIAGNOSIS — I959 Hypotension, unspecified: Secondary | ICD-10-CM | POA: Diagnosis present

## 2022-04-16 DIAGNOSIS — E785 Hyperlipidemia, unspecified: Secondary | ICD-10-CM | POA: Diagnosis present

## 2022-04-16 DIAGNOSIS — Z8249 Family history of ischemic heart disease and other diseases of the circulatory system: Secondary | ICD-10-CM | POA: Diagnosis not present

## 2022-04-16 DIAGNOSIS — N1831 Chronic kidney disease, stage 3a: Secondary | ICD-10-CM | POA: Diagnosis present

## 2022-04-16 DIAGNOSIS — I2510 Atherosclerotic heart disease of native coronary artery without angina pectoris: Secondary | ICD-10-CM | POA: Diagnosis present

## 2022-04-16 DIAGNOSIS — Z79899 Other long term (current) drug therapy: Secondary | ICD-10-CM | POA: Diagnosis not present

## 2022-04-16 DIAGNOSIS — E860 Dehydration: Secondary | ICD-10-CM | POA: Diagnosis present

## 2022-04-16 DIAGNOSIS — E871 Hypo-osmolality and hyponatremia: Secondary | ICD-10-CM | POA: Diagnosis present

## 2022-04-16 DIAGNOSIS — I252 Old myocardial infarction: Secondary | ICD-10-CM | POA: Diagnosis not present

## 2022-04-16 DIAGNOSIS — Z791 Long term (current) use of non-steroidal anti-inflammatories (NSAID): Secondary | ICD-10-CM | POA: Diagnosis not present

## 2022-04-16 DIAGNOSIS — K529 Noninfective gastroenteritis and colitis, unspecified: Secondary | ICD-10-CM | POA: Diagnosis present

## 2022-04-16 DIAGNOSIS — Z803 Family history of malignant neoplasm of breast: Secondary | ICD-10-CM | POA: Diagnosis not present

## 2022-04-16 LAB — CBC
HCT: 28.7 % — ABNORMAL LOW (ref 36.0–46.0)
Hemoglobin: 9.3 g/dL — ABNORMAL LOW (ref 12.0–15.0)
MCH: 29.6 pg (ref 26.0–34.0)
MCHC: 32.4 g/dL (ref 30.0–36.0)
MCV: 91.4 fL (ref 80.0–100.0)
Platelets: 247 10*3/uL (ref 150–400)
RBC: 3.14 MIL/uL — ABNORMAL LOW (ref 3.87–5.11)
RDW: 12 % (ref 11.5–15.5)
WBC: 7.3 10*3/uL (ref 4.0–10.5)
nRBC: 0 % (ref 0.0–0.2)

## 2022-04-16 LAB — BASIC METABOLIC PANEL
Anion gap: 8 (ref 5–15)
BUN: 19 mg/dL (ref 8–23)
CO2: 21 mmol/L — ABNORMAL LOW (ref 22–32)
Calcium: 8.4 mg/dL — ABNORMAL LOW (ref 8.9–10.3)
Chloride: 105 mmol/L (ref 98–111)
Creatinine, Ser: 1.08 mg/dL — ABNORMAL HIGH (ref 0.44–1.00)
GFR, Estimated: 51 mL/min — ABNORMAL LOW (ref >60)
Glucose, Bld: 87 mg/dL (ref 70–99)
Potassium: 3.5 mmol/L (ref 3.5–5.1)
Sodium: 134 mmol/L — ABNORMAL LOW (ref 135–145)

## 2022-04-16 LAB — PHOSPHORUS: Phosphorus: 4 mg/dL (ref 2.5–4.6)

## 2022-04-16 LAB — C DIFFICILE QUICK SCREEN W PCR REFLEX
C Diff antigen: NEGATIVE
C Diff interpretation: NOT DETECTED
C Diff toxin: NEGATIVE

## 2022-04-16 LAB — MAGNESIUM: Magnesium: 1.9 mg/dL (ref 1.7–2.4)

## 2022-04-16 MED ORDER — GABAPENTIN 300 MG PO CAPS
300.0000 mg | ORAL_CAPSULE | Freq: Three times a day (TID) | ORAL | Status: DC
  Administered 2022-04-16 – 2022-04-18 (×6): 300 mg via ORAL
  Filled 2022-04-16 (×6): qty 1

## 2022-04-16 NOTE — Plan of Care (Signed)

## 2022-04-16 NOTE — Progress Notes (Signed)
Mobility Specialist - Progress Note   04/16/22 1300  Mobility  Activity Ambulated with assistance in hallway  Level of Assistance Standby assist, set-up cues, supervision of patient - no hands on  Assistive Device None  Distance Ambulated (ft) 800 ft  Activity Response Tolerated well  $Mobility charge 1 Mobility   Pt received in chair and agreed for mobility. No c/o pain nor discomfort. Pt returned to chair with all needs met and family in room.   Roderick Pee Mobility Specialist

## 2022-04-16 NOTE — Progress Notes (Addendum)
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  Patient reports having loose watery stools every morning for approximately 3 weeks, started taking Imodium little over a week ago, She was prescribed cholestyramine a few days ago and then developed worsening diarrhea as well as nausea and nonbloody vomiting.  She also reports having fever at home yesterday,  denies any travel or sick contacts. Upon arrival she was found to be afebrile.  Pertinent labs include sodium 129, potassium 3.2, creatinine 1.22, magnesium 1.5 lactic acid 2.2.  Patient was admitted for further evaluation and started on IV hydration.  Electrolytes replaced.  Assessment & Plan:   Principal Problem:   Nausea vomiting and diarrhea Active Problems:   HTN (hypertension)   Hypokalemia   Hyponatremia   Chronic kidney disease, stage 3a (HCC)   Hypomagnesemia  Colitis: Patient presented with 3 weeks history of loose watery stool and more recently nausea, vomiting and fever and found to have nonspecific colitis on CT abdomen and pelvis.   Continue IV antibiotics ceftriaxone and Flagyl Continue IV hydration and was given antibiotics in the ED. Follow-up GI pathogen panel and C. difficile.  Advance diet as tolerated.   Electrolyte abnormalities: Hyponatremia, hypokalemia, hypomagnesemia: Replaced.  Continue to monitor.  Hold HCTZ.  CKD stage IIIa: Serum creatinine 1.2 to up from baseline 0.97 Hold ACE inhibitors and HCTZ.  Continue IV hydration.   Avoid nephrotoxic medication, follow-up serum creatinine  Hypertension: Blood pressure medications on hold due to  hypotension  Hyperlipidemia :  Continue Lipitor  DVT prophylaxis: Lovenox Code Status: Full code Family Communication: No family at bed side Disposition Plan:   Status is: Inpatient Remains inpatient appropriate because: Admitted for nausea vomiting and diarrhea found to have colitis.  Consultants:  None  Procedures: None  Antimicrobials: Anti-infectives (From admission, onward)    Start     Dose/Rate Route Frequency Ordered Stop   04/16/22 0800  metroNIDAZOLE (FLAGYL) IVPB 500 mg        500 mg 100 mL/hr over 60 Minutes Intravenous Every 12 hours 04/15/22 2043     04/15/22 1945  cefTRIAXone (ROCEPHIN) 2 g in sodium chloride 0.9 % 100 mL IVPB        2 g 200 mL/hr over 30 Minutes Intravenous Every 24 hours 04/15/22 1935     04/15/22 1930  ciprofloxacin (CIPRO) IVPB 400 mg  Status:  Discontinued        400 mg 200 mL/hr over 60 Minutes Intravenous  Once 04/15/22 1919 04/15/22 1935   04/15/22 1930  metroNIDAZOLE (FLAGYL) IVPB 500 mg        500 mg 100 mL/hr over 60 Minutes Intravenous  Once 04/15/22 1919 04/15/22 2117       Subjective: Patient was seen and examined at bedside.  Overnight events noted.   Patient reports feeling much improved.  She still reports having loose watery stools.  Denies any nausea and vomiting.   Objective: Vitals:   04/16/22 0132 04/16/22 0628 04/16/22 0845 04/16/22 1031  BP: (!) 124/53 127/74  (!) 98/55  Pulse: 93 75  66  Resp: '18 18 19 16  '$ Temp: (!) 100.8 F (38.2 C) 97.7 F (36.5  C)  98.4 F (36.9 C)  TempSrc: Oral Oral    SpO2: 95% 92%  95%  Weight:      Height:        Intake/Output Summary (Last 24 hours) at 04/16/2022 1433 Last data filed at 04/16/2022 1300 Gross per 24 hour  Intake 2804.78 ml  Output 850 ml  Net 1954.78 ml   Filed Weights   04/15/22 2120  Weight: 68.3 kg    Examination:  General exam: Appears comfortable, not in any acute distress.  Deconditioned Respiratory system: CTA bilaterally, no wheezing, no  crackles, normal respiratory effort Cardiovascular system: S1 & S2 heard, regular rate and rhythm, no murmur. Gastrointestinal system: Abdomen is mildly tender, non distended, BS+ Central nervous system: Alert and oriented x 3. No focal neurological deficits. Extremities: No edema, no cyanosis, no clubbing Skin: No rashes, lesions or ulcers Psychiatry: Judgement and insight appear normal. Mood & affect appropriate.     Data Reviewed: I have personally reviewed following labs and imaging studies  CBC: Recent Labs  Lab 04/15/22 1651 04/16/22 0526  WBC 7.4 7.3  NEUTROABS 4.8  --   HGB 11.9* 9.3*  HCT 37.3 28.7*  MCV 94.9 91.4  PLT 233 086   Basic Metabolic Panel: Recent Labs  Lab 04/15/22 1651 04/16/22 0526  NA 129* 134*  K 3.2* 3.5  CL 98 105  CO2 18* 21*  GLUCOSE 101* 87  BUN 25* 19  CREATININE 1.22* 1.08*  CALCIUM 9.4 8.4*  MG 1.5* 1.9  PHOS  --  4.0   GFR: Estimated Creatinine Clearance: 38.1 mL/min (A) (by C-G formula based on SCr of 1.08 mg/dL (H)). Liver Function Tests: Recent Labs  Lab 04/15/22 1651  AST 17  ALT 8  ALKPHOS 48  BILITOT 1.0  PROT 6.2*  ALBUMIN 3.2*   No results for input(s): "LIPASE", "AMYLASE" in the last 168 hours. No results for input(s): "AMMONIA" in the last 168 hours. Coagulation Profile: No results for input(s): "INR", "PROTIME" in the last 168 hours. Cardiac Enzymes: No results for input(s): "CKTOTAL", "CKMB", "CKMBINDEX", "TROPONINI" in the last 168 hours. BNP (last 3 results) No results for input(s): "PROBNP" in the last 8760 hours. HbA1C: No results for input(s): "HGBA1C" in the last 72 hours. CBG: No results for input(s): "GLUCAP" in the last 168 hours. Lipid Profile: No results for input(s): "CHOL", "HDL", "LDLCALC", "TRIG", "CHOLHDL", "LDLDIRECT" in the last 72 hours. Thyroid Function Tests: No results for input(s): "TSH", "T4TOTAL", "FREET4", "T3FREE", "THYROIDAB" in the last 72 hours. Anemia Panel: No results  for input(s): "VITAMINB12", "FOLATE", "FERRITIN", "TIBC", "IRON", "RETICCTPCT" in the last 72 hours. Sepsis Labs: Recent Labs  Lab 04/15/22 1736 04/15/22 1935  LATICACIDVEN 2.2* 1.1    Recent Results (from the past 240 hour(s))  C Difficile Quick Screen w PCR reflex     Status: None   Collection Time: 04/15/22  8:17 AM   Specimen: Stool  Result Value Ref Range Status   C Diff antigen NEGATIVE NEGATIVE Final   C Diff toxin NEGATIVE NEGATIVE Final   C Diff interpretation No C. difficile detected.  Final    Comment: Performed at [facility], [place] [address], [place], [place] [address]    Radiology Studies: CT Abdomen Pelvis W Contrast  Result Date: 04/15/2022 CLINICAL DATA:  Nausea and vomiting with abdominal pain. EXAM: CT ABDOMEN AND PELVIS WITH CONTRAST TECHNIQUE: Multidetector CT imaging of the abdomen and pelvis was performed using the standard protocol following bolus administration of intravenous  contrast. RADIATION DOSE REDUCTION: This exam was performed according to the departmental dose-optimization program which includes automated exposure control, adjustment of the mA and/or kV according to patient size and/or use of iterative reconstruction technique. CONTRAST:  152m OMNIPAQUE IOHEXOL 300 MG/ML  SOLN COMPARISON:  None Available. FINDINGS: Lower chest: No acute abnormality. Hepatobiliary: There are a few scattered rounded hypodensities throughout the liver which are too small to characterize measuring up to 1 cm. These are favored as small cysts or hemangiomas. The gallbladder and bile ducts are within normal limits. Pancreas: Unremarkable. No pancreatic ductal dilatation or surrounding inflammatory changes. Spleen: Normal in size without focal abnormality. Adrenals/Urinary Tract: Adrenal glands are unremarkable. Kidneys are normal, without renal calculi, focal lesion, or hydronephrosis. Bladder is unremarkable. Stomach/Bowel: There is mild diffuse colonic wall  thickening. No evidence for bowel obstruction, pneumatosis or free air. Appendix is not seen. Small bowel is within normal limits. The stomach is decompressed. There is some questionable gastric antral wall thickening. Vascular/Lymphatic: Aortic atherosclerosis. No enlarged abdominal or pelvic lymph nodes. Reproductive: Uterus and bilateral adnexa are unremarkable. Other: There is trace free fluid in the pelvis. No abdominal wall hernia. Musculoskeletal: Severe degenerative changes affect the spine. There are chronic compression fractures of T12 and L4. IMPRESSION: 1. Mild diffuse colonic wall thickening worrisome for nonspecific colitis. 2. Questionable gastric antral wall thickening. Correlate clinically for gastritis or ulcer disease. 3. Small amount of free fluid in the pelvis. 4. Rounded hypodensities in the liver are too small to characterize, likely cysts or hemangiomas. These can be further evaluated with MRI if clinically warranted. 5.  Aortic Atherosclerosis (ICD10-I70.0). Electronically Signed   By: ARonney AstersM.D.   On: 04/15/2022 18:48    Scheduled Meds:  atorvastatin  20 mg Oral Daily   enoxaparin (LOVENOX) injection  40 mg Subcutaneous Q24H   levothyroxine  75 mcg Oral Daily   pantoprazole  40 mg Oral Daily   sodium chloride flush  3 mL Intravenous Q12H   Continuous Infusions:  cefTRIAXone (ROCEPHIN)  IV 200 mL/hr at 04/16/22 1200   metronidazole Stopped (04/16/22 0957)     LOS: 0 days    Time spent: 50 mins    Kanyia Heaslip, MD [facility]   If 7PM-7AM, please contact night-coverage

## 2022-04-17 DIAGNOSIS — R112 Nausea with vomiting, unspecified: Secondary | ICD-10-CM | POA: Diagnosis not present

## 2022-04-17 DIAGNOSIS — R197 Diarrhea, unspecified: Secondary | ICD-10-CM | POA: Diagnosis not present

## 2022-04-17 LAB — GASTROINTESTINAL PANEL BY PCR, STOOL (REPLACES STOOL CULTURE)

## 2022-04-17 LAB — BASIC METABOLIC PANEL
Anion gap: 5 (ref 5–15)
BUN: 12 mg/dL (ref 8–23)
CO2: 23 mmol/L (ref 22–32)
Calcium: 8.2 mg/dL — ABNORMAL LOW (ref 8.9–10.3)
Chloride: 108 mmol/L (ref 98–111)
Creatinine, Ser: 0.96 mg/dL (ref 0.44–1.00)
GFR, Estimated: 59 mL/min — ABNORMAL LOW (ref >60)
Glucose, Bld: 97 mg/dL (ref 70–99)
Potassium: 3.1 mmol/L — ABNORMAL LOW (ref 3.5–5.1)
Sodium: 136 mmol/L (ref 135–145)

## 2022-04-17 LAB — CBC
HCT: 28.7 % — ABNORMAL LOW (ref 36.0–46.0)
Hemoglobin: 9.5 g/dL — ABNORMAL LOW (ref 12.0–15.0)
MCH: 30.5 pg (ref 26.0–34.0)
MCHC: 33.1 g/dL (ref 30.0–36.0)
MCV: 92.3 fL (ref 80.0–100.0)
Platelets: 241 10*3/uL (ref 150–400)
RBC: 3.11 MIL/uL — ABNORMAL LOW (ref 3.87–5.11)
RDW: 12 % (ref 11.5–15.5)
WBC: 6.7 10*3/uL (ref 4.0–10.5)
nRBC: 0 % (ref 0.0–0.2)

## 2022-04-17 LAB — POTASSIUM: Potassium: 3.5 mmol/L (ref 3.5–5.1)

## 2022-04-17 MED ORDER — POTASSIUM CHLORIDE 20 MEQ PO PACK
40.0000 meq | PACK | Freq: Once | ORAL | Status: AC
  Administered 2022-04-17: 40 meq via ORAL
  Filled 2022-04-17: qty 2

## 2022-04-17 NOTE — Progress Notes (Signed)
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  Patient reports having loose watery stools every morning for approximately 3 weeks, started taking Imodium little over a week ago, She was prescribed cholestyramine a few days ago and then developed worsening diarrhea as well as nausea and nonbloody vomiting.  She also reports having fever at home yesterday,  denies any travel or sick contacts.Upon arrival she was found to be afebrile.  Pertinent labs include sodium 129, potassium 3.2, creatinine 1.22, magnesium 1.5 lactic acid 2.2.  Patient was admitted for further evaluation and started on IV hydration.  Electrolytes replaced. C. Diff negative, GI Panel negative.  Assessment & Plan:   Principal Problem:   Nausea vomiting and diarrhea Active Problems:   HTN (hypertension)   Hypokalemia   Hyponatremia   Chronic kidney disease, stage 3a (HCC)   Hypomagnesemia  Colitis: Patient presented with 3 weeks history of loose watery stool and more recently nausea, vomiting and fever and found to have nonspecific colitis on CT abdomen and pelvis.   Continue IV antibiotics(  ceftriaxone and Flagyl) Continue IV hydration and was given antibiotics in the ED. C. difficile negative, GI panel pending Advance diet as tolerated.  Electrolyte abnormalities: Hyponatremia, hypokalemia, hypomagnesemia: Replaced.  Continue to monitor.  Hold HCTZ. Hyponatremia improved.  Hypomagnesemia improved. Potassium continue to monitor  CKD stage IIIa: Serum creatinine 1.2 to up from baseline 0.97 Hold ACE inhibitors and HCTZ.  Continue IV hydration.   Avoid  nephrotoxic medications, follow-up serum creatinine.  Hypertension: Blood pressure medications on hold due to hypotension.  Hyperlipidemia :  Continue Lipitor  DVT prophylaxis: Lovenox Code Status: Full code Family Communication: No family at bed side Disposition Plan:   Status is: Inpatient Remains inpatient appropriate because: Admitted for nausea vomiting and diarrhea found to have colitis.  Anticipated discharge home 04/18/2022.  Consultants:  None  Procedures: None  Antimicrobials: Anti-infectives (From admission, onward)    Start     Dose/Rate Route Frequency Ordered Stop   04/16/22 0800  metroNIDAZOLE (FLAGYL) IVPB 500 mg        500 mg 100 mL/hr over 60 Minutes Intravenous Every 12 hours 04/15/22 2043     04/15/22 1945  cefTRIAXone (ROCEPHIN) 2 g in sodium chloride 0.9 % 100 mL IVPB        2 g 200 mL/hr over 30 Minutes Intravenous Every 24 hours 04/15/22 1935     04/15/22 1930  ciprofloxacin (CIPRO) IVPB 400 mg  Status:  Discontinued        400 mg 200 mL/hr over 60 Minutes Intravenous  Once 04/15/22 1919 04/15/22 1935   04/15/22 1930  metroNIDAZOLE (FLAGYL) IVPB 500 mg        500 mg 100 mL/hr over 60 Minutes Intravenous  Once 04/15/22 1919 04/15/22 2117       Subjective: Patient was seen and examined at bedside.  Overnight events noted.   Patient reports having worsening diarrhea. She could not sleep last night due to multiple episodes of watery stools. She denies any nausea and vomiting.  She also reports having abdominal soreness.    Objective: Vitals:   04/16/22 1031 04/16/22 1710 04/16/22 2128 04/17/22 5956  BP: (!) 98/55 104/70 (!) 100/45 135/66  Pulse: 66 72 72 70  Resp: '16 16 14 14  '$ Temp: 98.4 F (36.9 C) 98.3 F (36.8 C) 98.4 F (36.9 C) 98.2 F (36.8 C)  TempSrc:   Oral Oral  SpO2: 95% 94% 94% 93%  Weight:    71.4 kg  Height:        Intake/Output Summary (Last 24 hours) at 04/17/2022 1212 Last data filed at 04/17/2022 0950 Gross per 24  hour  Intake 840 ml  Output 550 ml  Net 290 ml   Filed Weights   04/15/22 2120 04/17/22 0644  Weight: 68.3 kg 71.4 kg    Examination:  General exam: Appears comfortable, not in any acute distress.  Deconditioned Respiratory system: CTA bilaterally, respiratory effort normal, no accessory muscle use. Cardiovascular system: S1 & S2 heard, regular rate and rhythm, no murmur. Gastrointestinal system: Abdomen is sore, non distended, mildly tender, BS+ Central nervous system: Alert and oriented x 3. No focal neurological deficits. Extremities: No edema, no cyanosis, no clubbing Skin: No rashes, lesions or ulcers Psychiatry: Judgement and insight appear normal. Mood & affect appropriate.     Data Reviewed: I have personally reviewed following labs and imaging studies  CBC: Recent Labs  Lab 04/15/22 1651 04/16/22 0526 04/17/22 0553  WBC 7.4 7.3 6.7  NEUTROABS 4.8  --   --   HGB 11.9* 9.3* 9.5*  HCT 37.3 28.7* 28.7*  MCV 94.9 91.4 92.3  PLT 233 247 144   Basic Metabolic Panel: Recent Labs  Lab 04/15/22 1651 04/16/22 0526 04/17/22 0553  NA 129* 134* 136  K 3.2* 3.5 3.1*  CL 98 105 108  CO2 18* 21* 23  GLUCOSE 101* 87 97  BUN 25* 19 12  CREATININE 1.22* 1.08* 0.96  CALCIUM 9.4 8.4* 8.2*  MG 1.5* 1.9  --   PHOS  --  4.0  --    GFR: Estimated Creatinine Clearance: 43.8 mL/min (by C-G formula based on SCr of 0.96 mg/dL). Liver Function Tests: Recent Labs  Lab 04/15/22 1651  AST 17  ALT 8  ALKPHOS 48  BILITOT 1.0  PROT 6.2*  ALBUMIN 3.2*   No results for input(s): "LIPASE", "AMYLASE" in the last 168 hours. No results for input(s): "AMMONIA" in the last 168 hours. Coagulation Profile: No results for input(s): "INR", "PROTIME" in the last 168 hours. Cardiac Enzymes: No results for input(s): "CKTOTAL", "CKMB", "CKMBINDEX", "TROPONINI" in the last 168 hours. BNP (last 3 results) No results for input(s): "PROBNP" in the last 8760 hours. HbA1C: No results for  input(s): "HGBA1C" in the last 72 hours. CBG: No results for input(s): "GLUCAP" in the last 168 hours. Lipid Profile: No results for input(s): "CHOL", "HDL", "LDLCALC", "TRIG", "CHOLHDL", "LDLDIRECT" in the last 72 hours. Thyroid Function Tests: No results for input(s): "TSH", "T4TOTAL", "FREET4", "T3FREE", "THYROIDAB" in the last 72 hours. Anemia Panel: No results for input(s): "VITAMINB12", "FOLATE", "FERRITIN", "TIBC", "IRON", "RETICCTPCT" in the last 72 hours. Sepsis Labs: Recent Labs  Lab 04/15/22 1736 04/15/22 1935  LATICACIDVEN 2.2* 1.1    Recent Results (from the past 240 hour(s))  C Difficile Quick Screen w PCR reflex     Status: None   Collection Time: 04/15/22  8:17 AM   Specimen: Stool  Result Value Ref Range Status   C Diff antigen NEGATIVE NEGATIVE Final   C Diff toxin NEGATIVE NEGATIVE Final   C Diff interpretation No C. difficile detected.  Final    Comment: Performed at  [facility], [place] [address], [place], [place] [address]    Radiology Studies: CT Abdomen Pelvis W Contrast  Result Date: 04/15/2022 CLINICAL DATA:  Nausea and vomiting with abdominal pain. EXAM: CT ABDOMEN AND PELVIS WITH CONTRAST TECHNIQUE: Multidetector CT imaging of the abdomen and pelvis was performed using the standard protocol following bolus administration of intravenous contrast. RADIATION DOSE REDUCTION: This exam was performed according to the departmental dose-optimization program which includes automated exposure control, adjustment of the mA and/or kV according to patient size and/or use of iterative reconstruction technique. CONTRAST:  186m OMNIPAQUE IOHEXOL 300 MG/ML  SOLN COMPARISON:  None Available. FINDINGS: Lower chest: No acute abnormality. Hepatobiliary: There are a few scattered rounded hypodensities throughout the liver which are too small to characterize measuring up to 1 cm. These are favored as small cysts or hemangiomas. The gallbladder and bile ducts are  within normal limits. Pancreas: Unremarkable. No pancreatic ductal dilatation or surrounding inflammatory changes. Spleen: Normal in size without focal abnormality. Adrenals/Urinary Tract: Adrenal glands are unremarkable. Kidneys are normal, without renal calculi, focal lesion, or hydronephrosis. Bladder is unremarkable. Stomach/Bowel: There is mild diffuse colonic wall thickening. No evidence for bowel obstruction, pneumatosis or free air. Appendix is not seen. Small bowel is within normal limits. The stomach is decompressed. There is some questionable gastric antral wall thickening. Vascular/Lymphatic: Aortic atherosclerosis. No enlarged abdominal or pelvic lymph nodes. Reproductive: Uterus and bilateral adnexa are unremarkable. Other: There is trace free fluid in the pelvis. No abdominal wall hernia. Musculoskeletal: Severe degenerative changes affect the spine. There are chronic compression fractures of T12 and L4. IMPRESSION: 1. Mild diffuse colonic wall thickening worrisome for nonspecific colitis. 2. Questionable gastric antral wall thickening. Correlate clinically for gastritis or ulcer disease. 3. Small amount of free fluid in the pelvis. 4. Rounded hypodensities in the liver are too small to characterize, likely cysts or hemangiomas. These can be further evaluated with MRI if clinically warranted. 5.  Aortic Atherosclerosis (ICD10-I70.0). Electronically Signed   By: ARonney AstersM.D.   On: 04/15/2022 18:48    Scheduled Meds:  atorvastatin  20 mg Oral Daily   enoxaparin (LOVENOX) injection  40 mg Subcutaneous Q24H   gabapentin  300 mg Oral TID   levothyroxine  75 mcg Oral Daily   pantoprazole  40 mg Oral Daily   sodium chloride flush  3 mL Intravenous Q12H   Continuous Infusions:  cefTRIAXone (ROCEPHIN)  IV 2 g (04/16/22 1952)   metronidazole 500 mg (04/17/22 0801)     LOS: 1 day    Time spent: 35 mins    Kikue Gerhart, MD [facility]   If 7PM-7AM, please contact  night-coverage

## 2022-04-17 NOTE — Progress Notes (Signed)
Mobility Specialist - Progress Note   04/17/22 1327  Mobility  Activity Ambulated with assistance in hallway  Level of Assistance Standby assist, set-up cues, supervision of patient - no hands on  Assistive Device None  Distance Ambulated (ft) 400 ft  Activity Response Tolerated well  $Mobility charge 1 Mobility   Pt received in bed and agreed for mobility. Some pain in back and stomach. Pt returned to bed with all needs met.     Mobility Specialist   

## 2022-04-17 NOTE — TOC Initial Note (Signed)
Transition of Care [facility]) - Initial/Assessment Note    Patient Details  Name: [name] MRN: [id number] Date of Birth: January 26, 1941  Transition of Care [facility]) CM/SW Contact:    Leeroy Cha, RN Phone Number: 04/17/2022, 10:03 AM  Clinical Narrative:                  Transition of Care [facility]) Screening Note   Patient Details  Name: [name] Date of Birth: 1940-08-12   Transition of Care [facility]) CM/SW Contact:    Leeroy Cha, RN Phone Number: 04/17/2022, 10:03 AM    Transition of Care Department (TOC) has reviewed patient and no TOC needs have been identified at this time. We will continue to monitor patient advancement through interdisciplinary progression rounds. If new patient transition needs arise, please place a TOC consult.    Expected Discharge Plan: Home/Self Care Barriers to Discharge: Continued Medical Work up   Patient Goals and CMS Choice Patient states their goals for this hospitalization and ongoing recovery are:: to go home CMS Medicare.gov Compare Post Acute Care list provided to:: Patient    Expected Discharge Plan and Services Expected Discharge Plan: Home/Self Care   Discharge Planning Services: CM Consult   Living arrangements for the past 2 months: Single Family Home                                      Prior Living Arrangements/Services Living arrangements for the past 2 months: Single Family Home Lives with:: Self Patient language and need for interpreter reviewed:: Yes Do you feel safe going back to the place where you live?: Yes            Criminal Activity/Legal Involvement Pertinent to Current Situation/Hospitalization: No - Comment as needed  Activities of Daily Living Home Assistive Devices/Equipment: Eyeglasses ADL Screening (condition at time of admission) Patient's cognitive ability adequate to safely complete daily activities?: Yes Is the patient deaf or have difficulty hearing?: No Does  the patient have difficulty seeing, even when wearing glasses/contacts?: No Does the patient have difficulty concentrating, remembering, or making decisions?: No Patient able to express need for assistance with ADLs?: Yes Does the patient have difficulty dressing or bathing?: No Independently performs ADLs?: Yes (appropriate for developmental age) Does the patient have difficulty walking or climbing stairs?: No Weakness of Legs: None Weakness of Arms/Hands: None  Permission Sought/Granted                  Emotional Assessment Appearance:: Appears stated age Attitude/Demeanor/Rapport: Engaged Affect (typically observed): Calm Orientation: : Oriented to Self, Oriented to Place, Oriented to  Time, Oriented to Situation Alcohol / Substance Use: Alcohol Use Psych Involvement: No (comment)  Admission diagnosis:  Colitis [K52.9] Nausea vomiting and diarrhea [R11.2, R19.7] Patient Active Problem List   Diagnosis Date Noted   Nausea vomiting and diarrhea 04/15/2022   Hypomagnesemia 04/15/2022   Colitis    Diabetic polyneuropathy associated with type 2 diabetes mellitus ([place]) 01/03/2021   Chronic kidney disease, stage 3a ([place]) 12/01/2020   Peripheral neuropathy 08/28/2020   Pulmonary HTN ([place]) 07/25/2015   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   Sinus bradycardia 08/17/2013   Anticoagulation adequate 07/20/2013   H/O Graves' disease 06/26/2013   Abnormal EKG    Takotsubo syndrome 06/19/2013   Atrial fibrillation ([place]) 06/19/2013   Orthostasis    H/O dizziness 12/26/2012   Abnormal LFTs 12/26/2012  NSAID long-term use 12/05/2012   Acute epigastric pain 12/05/2012   CIN I (cervical intraepithelial neoplasia I)    Ringing in ears 05/09/2011   Dyslipidemia    HTN (hypertension)    Mitral valve prolapse    Tricuspid regurgitation    Subdural hematoma (HCC)    Ejection fraction    Hypokalemia    Syncope    Hyponatremia    Personal history of colonic polyps 02/06/2011    Personal history of failed moderate sedation 02/06/2011   RHINITIS 08/23/2010   ARTHRITIS, KNEE 08/23/2010   SKIN CANCER, HX OF 02/24/2010   FX CLSD SKL VLT W/HEM NEC, CONCUSSION NOS 04/07/2007   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

## 2022-04-18 DIAGNOSIS — R197 Diarrhea, unspecified: Secondary | ICD-10-CM | POA: Diagnosis not present

## 2022-04-18 DIAGNOSIS — R112 Nausea with vomiting, unspecified: Secondary | ICD-10-CM | POA: Diagnosis not present

## 2022-04-18 NOTE — Discharge Instructions (Signed)
Advised to follow-up with primary care physician in 1 week. Advised to follow-up with Dr. Arelia Longest as scheduled. Advised to take Imodium as needed for diarrhea.

## 2022-04-18 NOTE — TOC Transition Note (Signed)
Transition of Care [facility]) - CM/SW Discharge Note   Patient Details  Name: [name] MRN: [id number] Date of Birth: 08/23/1940  Transition of Care [facility]) CM/SW Contact:  Leeroy Cha, RN Phone Number: 04/18/2022, 11:10 AM   Clinical Narrative:    [id number] discharged to return home.  Chart reviewed for TOC needs.  None found.  Patient self care.   Final next level of care: Home/Self Care Barriers to Discharge: Barriers Resolved   Patient Goals and CMS Choice Patient states their goals for this hospitalization and ongoing recovery are:: to go home CMS Medicare.gov Compare Post Acute Care list provided to:: Patient    Discharge Placement                       Discharge Plan and Services   Discharge Planning Services: CM Consult                                 Social Determinants of Health (SDOH) Interventions     Readmission Risk Interventions   No data to display

## 2022-04-18 NOTE — Discharge Summary (Signed)
Physician Discharge Summary  [name]:[id number] DOB: 08-Aug-1940 DOA: 04/15/2022  PCP: Deland Pretty, MD  Admit date: 04/15/2022  Discharge date: 04/18/2022  Admitted From: Home.  Disposition:  Home.  Recommendations for Outpatient Follow-up:  Follow up with PCP in 1-2 weeks. Please obtain BMP/CBC in one week. Advised to follow-up with gastroenterologist Dr. Arelia Longest as scheduled. Advised to take Imodium as needed for diarrhea.  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  Patient reports having loose watery stools every morning for approximately 3 weeks, started taking Imodium little over a week ago, She was prescribed cholestyramine a few days ago and then developed worsening diarrhea as well as nausea and nonbloody vomiting.  She also reports having fever at home,  denies any travel or sick contacts. Upon arrival she was found to be afebrile.  Pertinent labs include sodium 129, potassium 3.2, creatinine 1.22, magnesium 1.5 , lactic acid 2.2.  Patient was admitted for further evaluation and started on IV hydration.  Patient was managed with supportive care,  electrolytes replaced. C. Diff negative, GI Panel negative.  Patient has slight improvement in diarrhea.  Patient feels better and wants to be discharged.  It appears likely viral in origin.  Advised to take Imodium as needed for diarrhea, follow-up with Dr. Carlean Purl as scheduled.  Patient is being discharged home.  Discharge Diagnoses:  Principal Problem:   Nausea vomiting and diarrhea Active Problems:   HTN (hypertension)    Hypokalemia   Hyponatremia   Chronic kidney disease, stage 3a (HCC)   Hypomagnesemia  Colitis: Patient presented with 3 weeks history of loose watery stool and more recently nausea, vomiting and fever and found to have nonspecific colitis on CT abdomen and pelvis.   She was given IV antibiotics (  ceftriaxone and Flagyl) x 3 days. Continued IV hydration. C. difficile negative, GI panel negative.  Likely viral in origin Advance diet as tolerated.  She reports feeling slightly better.   Electrolyte abnormalities: > Improved. Hyponatremia, hypokalemia, hypomagnesemia: Replaced.  Continue to monitor.  Hold HCTZ. Hyponatremia improved.  Hypomagnesemia improved. Potassium continue to monitor   CKD stage IIIa: Serum creatinine 1.2 to up from baseline 0.97 Hold ACE inhibitors and HCTZ.  Continue IV hydration.   Avoid nephrotoxic medications, follow-up serum creatinine.   Hypertension: Blood pressure medications on hold due to hypotension. Blood pressure medication resumed.   Hyperlipidemia :  Continue Lipitor    Discharge Instructions  Discharge Instructions     Call MD for:  difficulty breathing, headache or visual disturbances   Complete by: As directed    Call MD for:  persistant dizziness or light-headedness   Complete by: As directed    Call MD for:  persistant nausea and vomiting   Complete by: As directed    Diet - low sodium heart healthy   Complete by: As directed    Diet Carb Modified   Complete by: As directed    Discharge instructions   Complete by: As directed    Advised to follow-up with primary care physician in 1 week. Advised to follow-up with Dr. Arelia Longest as scheduled. Advised to take Imodium as needed for diarrhea.   Increase activity  slowly   Complete by: As directed       Allergies as of 04/18/2022   No Known Allergies      Medication List     STOP taking these medications    cholestyramine light 4 GM/DOSE powder Commonly known as:  PREVALITE       TAKE these medications    ALPHA LIPOIC ACID PO Take by mouth.   amLODipine 5 MG tablet Commonly known as: NORVASC Take 1 tablet (5 mg total) by mouth daily.   atorvastatin 20 MG tablet Commonly known as: LIPITOR Take 1 tablet (20 mg total) by mouth daily.   Biotin 5000 MCG Caps Take 1 capsule by mouth daily.   CALTRATE 600 PO Take 1 tablet by mouth 2 (two) times a day.   Cholecalciferol 25 MCG (1000 UT) capsule Take 1,000 Units by mouth daily.   denosumab 60 MG/ML Sosy injection Commonly known as: PROLIA Inject 60 mg into the skin every 6 (six) months.   diclofenac sodium 1 % Gel Commonly known as: VOLTAREN 1 application.   enalapril 20 MG tablet Commonly known as: VASOTEC Take 1 tablet (20 mg total) by mouth 2 (two) times daily.   EVENING PRIMROSE OIL PO Take 1 capsule by mouth 2 (two) times a day.   gabapentin 300 MG capsule Commonly known as: NEURONTIN Take 3 capsules (900 mg total) by mouth 3 (three) times daily.   hydrALAZINE 50 MG tablet Commonly known as: APRESOLINE Take 1 tablet (50 mg total) by mouth 2 (two) times daily.   hydrochlorothiazide 25 MG tablet Commonly known as: HYDRODIURIL Take 1 tablet (25 mg total) by mouth daily.   levothyroxine 75 MCG tablet Commonly known as: SYNTHROID TAKE 1 TABLET BY MOUTH EVERY DAY   meloxicam 15 MG tablet Commonly known as: MOBIC Take 15 mg by mouth daily.   methylcellulose packet Take 1 each by mouth daily. CITRICEL   metroNIDAZOLE 0.75 % cream Commonly known as: METROCREAM Apply 1 application topically 2 (two) times daily.   NONFORMULARY OR COMPOUNDED ITEM Compound cream: Meloxicam 0.5% Doxepin 3% Amantadine 3% Dextromethorphan 2% Lidocaine 2%  Sig: Apply 1-2 grams to the affected area 3-4 times daily. Dispense 240 grams with 5 refills.   pantoprazole 40 MG tablet Commonly known as: PROTONIX Take 1 tablet by mouth daily.   tretinoin 0.05 % cream Commonly known as:  RETIN-A Apply 1 application topically at bedtime.   zolpidem 5 MG tablet Commonly known as: AMBIEN Take 5 mg by mouth at bedtime as needed for sleep.        Follow-up Information     Deland Pretty, MD Follow up in 1 week(s).   Specialty: Internal Medicine Contact information: [address] [place] [address] [phone]         Gatha Mayer, MD Follow up in 1 week(s).   Specialty: Gastroenterology Contact information: [address] [place] [address] [phone]                No Known Allergies  Consultations: None   Procedures/Studies: CT Abdomen Pelvis W Contrast  Result Date: 04/15/2022 CLINICAL DATA:  Nausea and vomiting with abdominal pain. EXAM: CT ABDOMEN AND PELVIS WITH CONTRAST TECHNIQUE: Multidetector CT imaging of the abdomen and pelvis was performed using the standard protocol following bolus administration of intravenous contrast. RADIATION DOSE REDUCTION: This exam was performed according to the departmental dose-optimization program which includes automated exposure control, adjustment of the mA and/or kV according to patient  size and/or use of iterative reconstruction technique. CONTRAST:  133m OMNIPAQUE IOHEXOL 300 MG/ML  SOLN COMPARISON:  None Available. FINDINGS: Lower chest: No acute abnormality. Hepatobiliary: There are a few scattered rounded hypodensities throughout the liver which are too small to characterize measuring up to 1 cm. These are favored as small cysts or hemangiomas. The gallbladder and bile ducts are within normal limits. Pancreas: Unremarkable. No pancreatic ductal dilatation or surrounding inflammatory changes. Spleen: Normal in size without focal abnormality. Adrenals/Urinary Tract: Adrenal glands are unremarkable. Kidneys are normal, without renal calculi, focal lesion, or hydronephrosis. Bladder is unremarkable. Stomach/Bowel: There is mild diffuse colonic wall thickening. No evidence for bowel  obstruction, pneumatosis or free air. Appendix is not seen. Small bowel is within normal limits. The stomach is decompressed. There is some questionable gastric antral wall thickening. Vascular/Lymphatic: Aortic atherosclerosis. No enlarged abdominal or pelvic lymph nodes. Reproductive: Uterus and bilateral adnexa are unremarkable. Other: There is trace free fluid in the pelvis. No abdominal wall hernia. Musculoskeletal: Severe degenerative changes affect the spine. There are chronic compression fractures of T12 and L4. IMPRESSION: 1. Mild diffuse colonic wall thickening worrisome for nonspecific colitis. 2. Questionable gastric antral wall thickening. Correlate clinically for gastritis or ulcer disease. 3. Small amount of free fluid in the pelvis. 4. Rounded hypodensities in the liver are too small to characterize, likely cysts or hemangiomas. These can be further evaluated with MRI if clinically warranted. 5.  Aortic Atherosclerosis (ICD10-I70.0). Electronically Signed   By: ARonney AstersM.D.   On: 04/15/2022 18:48     Subjective: Patient was seen and examined at bedside.  Overnight events noted.  Patient reports feeling slightly better She still has loose watery stool but is improving.  She tolerated soft diet.  Discharge Exam: Vitals:   04/17/22 2252 04/18/22 0621  BP: (!) 109/58 134/75  Pulse: 62 72  Resp: 18 18  Temp: 97.6 F (36.4 C) 98.3 F (36.8 C)  SpO2: 95% 94%   Vitals:   04/17/22 1438 04/17/22 2252 04/18/22 0621 04/18/22 0700  BP: (!) 109/53 (!) 109/58 134/75   Pulse: 66 62 72   Resp: '18 18 18   '$ Temp: 97.8 F (36.6 C) 97.6 F (36.4 C) 98.3 F (36.8 C)   TempSrc: Oral Oral Oral   SpO2: 96% 95% 94%   Weight:    69.6 kg  Height:        General: Pt is alert, awake, not in acute distress Cardiovascular: RRR, S1/S2 +, no rubs, no gallops Respiratory: CTA bilaterally, no wheezing, no rhonchi Abdominal: Soft, NT, ND, bowel sounds + Extremities: no edema, no  cyanosis    The results of significant diagnostics from this hospitalization (including imaging, microbiology, ancillary and laboratory) are listed below for reference.     Microbiology: Recent Results (from the past 240 hour(s))  C Difficile Quick Screen w PCR reflex     Status: None   Collection Time: 04/15/22  8:17 AM   Specimen: Stool  Result Value Ref Range Status   C Diff antigen NEGATIVE NEGATIVE Final   C Diff toxin NEGATIVE NEGATIVE Final   C Diff interpretation No C. difficile detected.  Final    Comment: Performed at [facility] [id number] [place] [address] Gastrointestinal Panel by PCR , Stool     Status: None   Collection Time: 04/15/22  9:17 AM   Specimen: Stool  Result Value Ref Range Status   Campylobacter species NOT DETECTED NOT DETECTED Final  Plesimonas shigelloides NOT DETECTED NOT DETECTED Final   Salmonella species NOT DETECTED NOT DETECTED Final   Yersinia enterocolitica NOT DETECTED NOT DETECTED Final   Vibrio species NOT DETECTED NOT DETECTED Final   Vibrio cholerae NOT DETECTED NOT DETECTED Final   Enteroaggregative E coli (EAEC) NOT DETECTED NOT DETECTED Final   Enteropathogenic E coli (EPEC) NOT DETECTED NOT DETECTED Final   Enterotoxigenic E coli (ETEC) NOT DETECTED NOT DETECTED Final   Shiga like toxin producing E coli (STEC) NOT DETECTED NOT DETECTED Final   Shigella/Enteroinvasive E coli (EIEC) NOT DETECTED NOT DETECTED Final   Cryptosporidium NOT DETECTED NOT DETECTED Final   Cyclospora cayetanensis NOT DETECTED NOT DETECTED Final   Entamoeba histolytica NOT DETECTED NOT DETECTED Final   Giardia lamblia NOT DETECTED NOT DETECTED Final   Adenovirus F40/41 NOT DETECTED NOT DETECTED Final   Astrovirus NOT DETECTED NOT DETECTED Final   Norovirus GI/GII NOT DETECTED NOT DETECTED Final   Rotavirus A NOT DETECTED NOT DETECTED Final   Sapovirus (I, II, IV, and V) NOT DETECTED NOT DETECTED Final    Comment:  Performed at [facility], [place] [place], [place] [address]     Labs: BNP (last 3 results) No results for input(s): "BNP" in the last 8760 hours. Basic Metabolic Panel: Recent Labs  Lab 04/15/22 1651 04/16/22 0526 04/17/22 0553 04/17/22 1248  NA 129* 134* 136  --   K 3.2* 3.5 3.1* 3.5  CL 98 105 108  --   CO2 18* 21* 23  --   GLUCOSE 101* 87 97  --   BUN 25* 19 12  --   CREATININE 1.22* 1.08* 0.96  --   CALCIUM 9.4 8.4* 8.2*  --   MG 1.5* 1.9  --   --   PHOS  --  4.0  --   --    Liver Function Tests: Recent Labs  Lab 04/15/22 1651  AST 17  ALT 8  ALKPHOS 48  BILITOT 1.0  PROT 6.2*  ALBUMIN 3.2*   No results for input(s): "LIPASE", "AMYLASE" in the last 168 hours. No results for input(s): "AMMONIA" in the last 168 hours. CBC: Recent Labs  Lab 04/15/22 1651 04/16/22 0526 04/17/22 0553  WBC 7.4 7.3 6.7  NEUTROABS 4.8  --   --   HGB 11.9* 9.3* 9.5*  HCT 37.3 28.7* 28.7*  MCV 94.9 91.4 92.3  PLT 233 247 241   Cardiac Enzymes: No results for input(s): "CKTOTAL", "CKMB", "CKMBINDEX", "TROPONINI" in the last 168 hours. BNP: Invalid input(s): "POCBNP" CBG: No results for input(s): "GLUCAP" in the last 168 hours. D-Dimer No results for input(s): "DDIMER" in the last 72 hours. Hgb A1c No results for input(s): "HGBA1C" in the last 72 hours. Lipid Profile No results for input(s): "CHOL", "HDL", "LDLCALC", "TRIG", "CHOLHDL", "LDLDIRECT" in the last 72 hours. Thyroid function studies No results for input(s): "TSH", "T4TOTAL", "T3FREE", "THYROIDAB" in the last 72 hours.  Invalid input(s): "FREET3" Anemia work up No results for input(s): "VITAMINB12", "FOLATE", "FERRITIN", "TIBC", "IRON", "RETICCTPCT" in the last 72 hours. Urinalysis    Component Value Date/Time   COLORURINE YELLOW 07/29/2013 [place] 07/29/2013 1345   LABSPEC 1.007 07/29/2013 1345   PHURINE 6.0 07/29/2013 1345   GLUCOSEU NEG 07/29/2013 1345   HGBUR NEG  07/29/2013 1345   BILIRUBINUR NEG 07/29/2013 1345   BILIRUBINUR n 12/05/2012 1431   KETONESUR NEG 07/29/2013 1345   PROTEINUR NEG 07/29/2013 1345   UROBILINOGEN 0.2 07/29/2013 1345  NITRITE NEG 07/29/2013 1345   LEUKOCYTESUR NEG 07/29/2013 1345   Sepsis Labs Recent Labs  Lab 04/15/22 1651 04/16/22 0526 04/17/22 0553  WBC 7.4 7.3 6.7   Microbiology Recent Results (from the past 240 hour(s))  C Difficile Quick Screen w PCR reflex     Status: None   Collection Time: 04/15/22  8:17 AM   Specimen: Stool  Result Value Ref Range Status   C Diff antigen NEGATIVE NEGATIVE Final   C Diff toxin NEGATIVE NEGATIVE Final   C Diff interpretation No C. difficile detected.  Final    Comment: Performed at [facility], [place] [address] [place], [place] [address]  Gastrointestinal Panel by PCR , Stool     Status: None   Collection Time: 04/15/22  9:17 AM   Specimen: Stool  Result Value Ref Range Status   Campylobacter species NOT DETECTED NOT DETECTED Final   Plesimonas shigelloides NOT DETECTED NOT DETECTED Final   Salmonella species NOT DETECTED NOT DETECTED Final   Yersinia enterocolitica NOT DETECTED NOT DETECTED Final   Vibrio species NOT DETECTED NOT DETECTED Final   Vibrio cholerae NOT DETECTED NOT DETECTED Final   Enteroaggregative E coli (EAEC) NOT DETECTED NOT DETECTED Final   Enteropathogenic E coli (EPEC) NOT DETECTED NOT DETECTED Final   Enterotoxigenic E coli (ETEC) NOT DETECTED NOT DETECTED Final   Shiga like toxin producing E coli (STEC) NOT DETECTED NOT DETECTED Final   Shigella/Enteroinvasive E coli (EIEC) NOT DETECTED NOT DETECTED Final   Cryptosporidium NOT DETECTED NOT DETECTED Final   Cyclospora cayetanensis NOT DETECTED NOT DETECTED Final   Entamoeba histolytica NOT DETECTED NOT DETECTED Final   Giardia lamblia NOT DETECTED NOT DETECTED Final   Adenovirus F40/41 NOT DETECTED NOT DETECTED Final   Astrovirus NOT DETECTED NOT DETECTED Final    Norovirus GI/GII NOT DETECTED NOT DETECTED Final   Rotavirus A NOT DETECTED NOT DETECTED Final   Sapovirus (I, II, IV, and V) NOT DETECTED NOT DETECTED Final    Comment: Performed at [facility], [address] [place], [place] [address]     Time coordinating discharge: Over 30 minutes  SIGNED:   Shawna Clamp, MD  [facility] 04/18/2022, 10:48 AM Pager   If 7PM-7AM, please contact night-coverage

## 2022-04-20 ENCOUNTER — Encounter: Payer: Self-pay | Admitting: Internal Medicine

## 2022-04-20 ENCOUNTER — Ambulatory Visit: Payer: PPO | Admitting: Internal Medicine

## 2022-04-20 ENCOUNTER — Other Ambulatory Visit (INDEPENDENT_AMBULATORY_CARE_PROVIDER_SITE_OTHER): Payer: PPO

## 2022-04-20 VITALS — BP 104/60 | HR 88 | Ht 64.0 in | Wt 148.4 lb

## 2022-04-20 DIAGNOSIS — E876 Hypokalemia: Secondary | ICD-10-CM | POA: Diagnosis not present

## 2022-04-20 DIAGNOSIS — D649 Anemia, unspecified: Secondary | ICD-10-CM

## 2022-04-20 DIAGNOSIS — R197 Diarrhea, unspecified: Secondary | ICD-10-CM

## 2022-04-20 LAB — BASIC METABOLIC PANEL
BUN: 7 mg/dL (ref 6–23)
CO2: 27 mEq/L (ref 19–32)
Calcium: 8.6 mg/dL (ref 8.4–10.5)
Chloride: 103 mEq/L (ref 96–112)
Creatinine, Ser: 0.93 mg/dL (ref 0.40–1.20)
GFR: 57.11 mL/min — ABNORMAL LOW (ref >60.00)
Glucose, Bld: 96 mg/dL (ref 70–99)
Potassium: 3.4 mEq/L — ABNORMAL LOW (ref 3.5–5.1)
Sodium: 138 mEq/L (ref 135–145)

## 2022-04-20 LAB — CBC WITH DIFFERENTIAL/PLATELET
Basophils Absolute: 0 10*3/uL (ref 0.0–0.1)
Basophils Relative: 0.4 % (ref 0.0–3.0)
Eosinophils Absolute: 0.1 10*3/uL (ref 0.0–0.7)
Eosinophils Relative: 1.1 % (ref 0.0–5.0)
HCT: 34.6 % — ABNORMAL LOW (ref 36.0–46.0)
Hemoglobin: 11.7 g/dL — ABNORMAL LOW (ref 12.0–15.0)
Lymphocytes Relative: 21.2 % (ref 12.0–46.0)
Lymphs Abs: 2.1 10*3/uL (ref 0.7–4.0)
MCHC: 33.9 g/dL (ref 30.0–36.0)
MCV: 89.3 fl (ref 78.0–100.0)
Monocytes Absolute: 1.2 10*3/uL — ABNORMAL HIGH (ref 0.1–1.0)
Monocytes Relative: 12.5 % — ABNORMAL HIGH (ref 3.0–12.0)
Neutro Abs: 6.3 10*3/uL (ref 1.4–7.7)
Neutrophils Relative %: 64.8 % (ref 43.0–77.0)
Platelets: 413 10*3/uL — ABNORMAL HIGH (ref 150.0–400.0)
RBC: 3.87 Mil/uL (ref 3.87–5.11)
RDW: 12.5 % (ref 11.5–15.5)
WBC: 9.7 10*3/uL (ref 4.0–10.5)

## 2022-04-20 MED ORDER — POTASSIUM CHLORIDE CRYS ER 20 MEQ PO TBCR: 20.0000 meq | EXTENDED_RELEASE_TABLET | Freq: Every day | ORAL | 0 refills | Status: DC

## 2022-04-20 MED ORDER — DIPHENOXYLATE-ATROPINE 2.5-0.025 MG PO TABS: 1.0000 | ORAL_TABLET | Freq: Four times a day (QID) | ORAL | 0 refills | Status: DC | PRN

## 2022-04-20 NOTE — Patient Instructions (Addendum)
ORAL REHYDRATION SOLUTION RECIPES   Sugar and salt water   ? 1 quart water ?  teaspoon salt ? 6 teaspoons sugar ? Optional: Crystal Light to taste (especially lemonade or orange-pineapple flavors)   Gatorade G2   ? 4 cups Gatorade G2 (or one, 32 ounce bottle) ? 1/2 teaspoon salt   Chicken Broth   ? 4 cups water ? 1 dry chicken broth cube ?  teaspoon salt ? 2 tablespoon sugar OR ? 2 cups liquid broth ? 2 cups water ? 2 tablespoon sugar  Tomato Juice   ? 2  cups tomato juice ? 1  cups water  Homemade Cereal Based  ?  cup dry, precooked baby rice cereal ? 2 cups water ?  teaspoon salt ? Combine ingredients and mix until well dissolved and smooth. Refrigerate. Solution should be thick, but pourable and drinkable.  Your provider has requested that you go to the basement level for lab work before leaving today. Press "B" on the elevator. The lab is located at the first door on the left as you exit the elevator.  Due to recent changes in healthcare laws, you may see the results of your imaging and laboratory studies on MyChart before your provider has had a chance to review them.  We understand that in some cases there may be results that are confusing or concerning to you. Not all laboratory results come back in the same time frame and the provider may be waiting for multiple results in order to interpret others.  Please give [place] 48 hours in order for your provider to thoroughly review all the results before contacting the office for clarification of your results.   You have been scheduled for a colonoscopy. Please follow written instructions given to you at your visit today.  Please pick up your prep supplies at the pharmacy within the next 1-3 days. If you use inhalers (even only as needed), please bring them with you on the day of your procedure.  We have sent the following medications to your pharmacy for you to pick up at your convenience: [email or web address]   I  appreciate the opportunity to care for you. Silvano Rusk, MD, [facility]

## 2022-04-20 NOTE — Progress Notes (Signed)
[name] 80 y.o. 1940-11-14 [id number]  Assessment & Plan:   Encounter Diagnoses  Name Primary?   Diarrhea, unspecified type Yes   Hypokalemia    Normocytic anemia    She has had diarrhea for a month or more.  Given the negative work-up today and persistent issues I think microscopic colitis is high on the list of possibilities.  Lab follow-up as below and schedule colonoscopy.  This is scheduled for September 20 at 7:30 in the morning.  She needs random biopsies to look for microscopic colitis.  Chronic meloxicam use is a risk factor.  She is fearful of stopping that because it helps her arthritis and sciatica pain.  I am puzzled as to why she had diarrhea when starting cholestyramine, that really does not make sense.  It is possible she had something going on and then had a viral syndrome on top of an existing diarrheal problem.  No improved she is still significantly symptomatic.  Symptom control with generic Lomotil.  Side effects reviewed.  Aggressive hydration with oral rehydration solution type drinks.  Handout provided to suggest home recipes versus options to purchase.   Orders Placed This Encounter  Procedures   CBC with Differential/Platelet   Basic metabolic panel   Ambulatory referral to Gastroenterology   Meds ordered this encounter  Medications   diphenoxylate-atropine (LOMOTIL) 2.5-0.025 MG tablet    Sig: Take 1 tablet by mouth 4 (four) times daily as needed for diarrhea or loose stools.    Dispense:  90 tablet    Refill:  0    CC: Deland Pretty, MD   Lab Results  Component Value Date   WBC 9.7 04/20/2022   HGB 11.7 (L) 04/20/2022   HCT 34.6 (L) 04/20/2022   MCV 89.3 04/20/2022   PLT 413.0 (H) 04/20/2022   Lab Results  Component Value Date   CREATININE 0.93 04/20/2022   BUN 7 04/20/2022   NA 138 04/20/2022   K 3.4 (L) 04/20/2022   CL 103 04/20/2022   CO2 27 04/20/2022    Potassium not normal though almost so.  I will send in  prescription for 2 weeks of potassium supplementation. We will call from office  Subjective:   Chief Complaint: Diarrhea  HPI 81 year old white woman here with her daughter because of diarrhea problems.  She has a past medical history of IBS and colon polyps, polymyalgia rheumatica, Graves' disease and postradiation hypothyroidism, Takotsubo cardiomyopathy, subdural hematoma, osteoporosis and chronic kidney disease.  She was doing reasonably well until about a month or so ago when she started having some diarrhea problems.  She saw primary care, Dr. Shelia Media and stool pathogen panel and C. difficile testing was negative.  The diarrhea was worsening and a trial of cholestyramine was prescribed.  At that time last week she developed a fever to 103.81 time, and the watery diarrhea became profuse and uncontrollable.  Her daughter came to visit and there were some syncopal episodes over the last weekend and she was taken to the hospital and admitted for a few days.  Repeat stool testing with C. difficile testing and GI pathogen profile was negative.  A CT scan was done, abdomen pelvis with contrast and there was some thickening of the wall of the colon consistent with mild colitis.  She was hypokalemic and mildly anemic though there was no bleeding.  Potassium supplementation was given.  She did not have further fever.  She has not had any abdominal pain.  She talks about  having problems like this "years ago when I saw Dr. Velora Heckler ".  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  I had seen her for colon polyp surveillance and she had a colonoscopy in 2019 that was normal.  She is feeling a bit better but still having diarrhea problems particularly after she starts eating in the morning.  She still very weak she thinks.  Not sure what to eat.  She had been on a brat diet at home.  Has a metallic taste in her mouth ever since antibiotics at the hospital, she received 2 or 3 days of ceftriaxone and  metronidazole.  She has had some urgency and incontinence with stool.  She is not having any bleeding.  There were no new medications.  No sick contacts or trips etc.   TSH 2.19 July 2021 No Known Allergies Current Meds  Medication Sig   ALPHA LIPOIC ACID PO Take by mouth.   amLODipine (NORVASC) 5 MG tablet Take 1 tablet (5 mg total) by mouth daily.   atorvastatin (LIPITOR) 20 MG tablet Take 1 tablet (20 mg total) by mouth daily.   Biotin 5000 MCG CAPS Take 1 capsule by mouth daily.    Calcium Carbonate (CALTRATE 600 PO) Take 1 tablet by mouth 2 (two) times a day.    Cholecalciferol 25 MCG (1000 UT) capsule Take 1,000 Units by mouth daily.   denosumab (PROLIA) 60 MG/ML SOSY injection Inject 60 mg into the skin every 6 (six) months.   diclofenac sodium (VOLTAREN) 1 % GEL 1 application.   enalapril (VASOTEC) 20 MG tablet Take 1 tablet (20 mg total) by mouth 2 (two) times daily.   EVENING PRIMROSE OIL PO Take 1 capsule by mouth 2 (two) times a day.    gabapentin (NEURONTIN) 300 MG capsule Take 3 capsules (900 mg total) by mouth 3 (three) times daily.   hydrALAZINE (APRESOLINE) 50 MG tablet Take 1 tablet (50 mg total) by mouth 2 (two) times daily.   hydrochlorothiazide (HYDRODIURIL) 25 MG tablet Take 1 tablet (25 mg total) by mouth daily.   levothyroxine (SYNTHROID) 75 MCG tablet TAKE 1 TABLET BY MOUTH EVERY DAY   meloxicam (MOBIC) 15 MG tablet Take 15 mg by mouth daily.   methylcellulose packet Take 1 each by mouth daily. CITRICEL   metroNIDAZOLE (METROCREAM) 0.75 % cream Apply 1 application topically 2 (two) times daily.   NONFORMULARY OR COMPOUNDED ITEM Compound cream: Meloxicam 0.5% Doxepin 3% Amantadine 3% Dextromethorphan 2% Lidocaine 2%  Sig: Apply 1-2 grams to the affected area 3-4 times daily. Dispense 240 grams with 5 refills.   pantoprazole (PROTONIX) 40 MG tablet Take 1 tablet by mouth daily.   tretinoin (RETIN-A) 0.05 % cream Apply 1 application topically at bedtime.    zolpidem (AMBIEN) 5 MG tablet Take 5 mg by mouth at bedtime as needed for sleep.   Past Medical History:  Diagnosis Date   Abnormal EKG    Atrial fibrillation (HCC)    Basal cell carcinoma of nose    removed w/MOHs   CAD (coronary artery disease)    a. 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 Marcelino Scot, 03/2019 T score -2.3   Polymyalgia rheumatica (HCC)    PONV (postoperative nausea and vomiting)    Potassium (K) deficiency    5 potassium requirement over time   Subdural hematoma (HCC)    chronic per neurosurgery in the past, some headaches   Syncope    August, 200 weight, dehydration   Takotsubo cardiomyopathy 11.12.2014   a. EF 30-35% at [facility] 06/2013;  b.  f/u Echo (06/18/13):  EF 60-65%, normal wall motion, Gr 1 DD, mild MVP of post leaflet, mod TR, PASP 63   Thyroid disease    Tricuspid regurgitation    mild to moderate, echo, January, 2011, PA pressure 38 mm mercury   Past Surgical History:  Procedure Laterality Date   CARDIAC CATHETERIZATION  06/17/2013   COLONOSCOPY  12/23/2003   normal (indications: prior adenomas and grandfather with colon cancer)   COLPOSCOPY      EXCISIONAL HEMORRHOIDECTOMY  2003   LEFT HEART CATHETERIZATION WITH CORONARY ANGIOGRAM N/A 06/17/2013   Procedure: LEFT HEART CATHETERIZATION WITH CORONARY ANGIOGRAM;  Surgeon: Peter M Martinique, MD;  Location: [facility] CATH LAB;  Service: Cardiovascular;  Laterality: N/A;   MOHS SURGERY Left 2011   "side of my nose" (06/17/2013)   TONSILLECTOMY     TOTAL KNEE ARTHROPLASTY  08/2010   TOTAL KNEE ARTHROPLASTY  02/25/2012   Procedure: TOTAL KNEE ARTHROPLASTY;  Surgeon: Gearlean Alf, MD;  Location: [facility] ORS;  Service: Orthopedics;  Laterality: Right;   Social History   Social History Narrative   3 caffeine drinks daily    Widowed   Retired      Lives alone   Right handed   Caffeine: 1.5 cup/day   family history includes Breast cancer in her paternal aunt; Breast cancer (age of onset: 53) in her sister; Colon cancer in her maternal grandfather; Coronary artery disease in her father; Esophageal cancer in her paternal grandmother; Heart attack (age of onset: 11) in her mother; Heart attack (age of onset: 12) in her father; Heart disease in her father, maternal grandfather, mother, paternal grandfather, and sister; Hypertension in her father, mother, and sister; Irritable bowel syndrome in her sister; Stroke in her mother.   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  Otherwise as per HPI  Objective:   Physical Exam '@BP'$  104/60   Pulse 88   Ht '5\' 4"'$  (1.626 m)   Wt 148 lb 6.4 oz (67.3 kg)   BMI 25.47 kg/m @  General:  Well-developed, well-nourished and in no acute distress Eyes:  anicteric. Lungs: Clear to auscultation bilaterally. Heart:  S1S2, no rubs, murmurs, gallops. Abdomen:  soft, non-tender, no hepatosplenomegaly, hernia, or mass and BS+.  Rectal: deferred  Neuro:  A&O x 3.  Psych:  appropriate mood and  Affect.   Data Reviewed: See  HPI-I have reviewed recent hospitalization records primary care information through care everywhere, labs previous GI  notes/colonoscopy

## 2022-04-25 ENCOUNTER — Ambulatory Visit (AMBULATORY_SURGERY_CENTER): Payer: PPO | Admitting: Internal Medicine

## 2022-04-25 ENCOUNTER — Encounter: Payer: Self-pay | Admitting: Internal Medicine

## 2022-04-25 VITALS — BP 111/59 | HR 74 | Temp 97.8°F | Resp 11 | Ht 64.0 in | Wt 148.0 lb

## 2022-04-25 DIAGNOSIS — R197 Diarrhea, unspecified: Secondary | ICD-10-CM | POA: Diagnosis not present

## 2022-04-25 DIAGNOSIS — K52831 Collagenous colitis: Secondary | ICD-10-CM

## 2022-04-25 DIAGNOSIS — K5140 Inflammatory polyps of colon without complications: Secondary | ICD-10-CM | POA: Diagnosis not present

## 2022-04-25 DIAGNOSIS — I10 Essential (primary) hypertension: Secondary | ICD-10-CM | POA: Diagnosis not present

## 2022-04-25 DIAGNOSIS — D649 Anemia, unspecified: Secondary | ICD-10-CM | POA: Diagnosis not present

## 2022-04-25 DIAGNOSIS — D125 Benign neoplasm of sigmoid colon: Secondary | ICD-10-CM

## 2022-04-25 DIAGNOSIS — K52839 Microscopic colitis, unspecified: Secondary | ICD-10-CM | POA: Diagnosis not present

## 2022-04-25 DIAGNOSIS — I2510 Atherosclerotic heart disease of native coronary artery without angina pectoris: Secondary | ICD-10-CM | POA: Diagnosis not present

## 2022-04-25 MED ORDER — SODIUM CHLORIDE 0.9 % IV SOLN: 500.0000 mL | Freq: Once | INTRAVENOUS | Status: DC

## 2022-04-25 NOTE — Op Note (Signed)
[facility] Patient Name: [name] Procedure Date: 04/25/2022 7:27 AM MRN: [id number] Endoscopist: Gatha Mayer , MD Age: 81 Referring MD:  Date of Birth: 04/09/1941 Gender: Female Account #: [id number] Procedure:                Colonoscopy Indications:              Clinically significant diarrhea of unexplained                            origin Had left-side colitis on recent CT Medicines:                Monitored Anesthesia Care Procedure:                Pre-Anesthesia Assessment:                           - Prior to the procedure, a History and Physical                            was performed, and patient medications and                            allergies were reviewed. The patient's tolerance of                            previous anesthesia was also reviewed. The risks                            and benefits of the procedure and the sedation                            options and risks were discussed with the patient.                            All questions were answered, and informed consent                            was obtained. Prior Anticoagulants: The patient has                            taken no previous anticoagulant or antiplatelet                            agents. ASA Grade Assessment: III - A patient with                            severe systemic disease. After reviewing the risks                            and benefits, the patient was deemed in                            satisfactory condition to undergo the procedure.  After obtaining informed consent, the colonoscope                            was passed under direct vision. Throughout the                            procedure, the patient's blood pressure, pulse, and                            oxygen saturations were monitored continuously. The                            Olympus CF-HQ190L [id number]) Colonoscope was                            introduced through  the anus and advanced to the the                            cecum, identified by appendiceal orifice and                            ileocecal valve. The colonoscopy was somewhat                            difficult due to poor endoscopic visualization, a                            redundant colon and significant looping. Successful                            completion of the procedure was aided by                            straightening and shortening the scope to obtain                            bowel loop reduction, applying abdominal pressure                            and lavage. The patient tolerated the procedure                            well. The quality of the bowel preparation was                            fair. The ileocecal valve, appendiceal orifice, and                            rectum were photographed. The bowel preparation                            used was Miralax via split dose instruction. Scope In: 7:42:46 AM Scope Out: 8:13:45 AM Scope Withdrawal Time: 0 hours 17 minutes 14 seconds  Total Procedure  Duration: 0 hours 30 minutes 59 seconds  Findings:                 The perianal and digital rectal examinations were                            normal.                           A polypoid lesion was found in the sigmoid colon.                            The lesion was linear. This was biopsied with a                            cold forceps for histology. Verification of patient                            identification for the specimen was done. Estimated                            blood loss was minimal.                           A localized area of moderately erythematous mucosa                            was found in the recto-sigmoid colon. Biopsies were                            taken with a cold forceps for histology.                            Verification of patient identification for the                            specimen was done. Estimated blood loss  was minimal.                           The exam was otherwise without abnormality on                            direct and retroflexion views.                           Biopsies for histology were taken with a cold                            forceps from the right colon and left colon for                            evaluation of microscopic colitis. Complications:            No immediate complications. Estimated Blood Loss:     Estimated blood loss was minimal. Impression:               -  Preparation of the colon was fair. Exam not                            adequate to exclude small polyps up to 1 cm or flat                            lesions either.                           - Benign polypoid lesion in the sigmoid colon.                            Biopsied. Long - 10 cm linear polypoid lesion 40-50                            cm ? healing colitis (favor) vbs neoplasia                           - Erythematous mucosa in the recto-sigmoid colon.                            Biopsied.                           - The examination was otherwise normal on direct                            and retroflexion views. Did see prior                            hemorrhoidectomy scar.                           - Biopsies were taken with a cold forceps from the                            right colon and left colon for evaluation of                            microscopic colitis. Recommendation:           - Patient has a contact number available for                            emergencies. The signs and symptoms of potential                            delayed complications were discussed with the                            patient. Return to normal activities tomorrow.                            Written discharge instructions were provided to the  patient.                           - Resume previous diet.                           - Continue present medications. Lomotil 1 pill was                             very effective                           - Await pathology results.                           - No recommendation at this time regarding repeat                            colonoscopy. Should she need one would consider                            abdominal binder, extra vs different prep and adult                            colonoscope Gatha Mayer, MD 04/25/2022 8:28:48 AM This report has been signed electronically.

## 2022-04-25 NOTE — Progress Notes (Signed)
History and Physical Interval Note:  04/25/2022 7:36 AM  [name]  has presented today for endoscopic procedure(s), with the diagnosis of  Encounter Diagnosis  Name Primary?   Diarrhea, unspecified type Yes  .  The various methods of evaluation and treatment have been discussed with the patient and/or family. After consideration of risks, benefits and other options for treatment, the patient has consented to  the endoscopic procedure(s).   The patient's history has been reviewed, patient examined, no change in status, stable for endoscopic procedure(s).  I have reviewed the patient's chart and labs.  Questions were answered to the patient's satisfaction.     Gatha Mayer, MD, [facility]

## 2022-04-25 NOTE — Progress Notes (Signed)
Report to PACU, RN, vss, BBS= Clear.  

## 2022-04-25 NOTE — Progress Notes (Signed)
Called to room to assist during endoscopic procedure.  Patient ID and intended procedure confirmed with present staff. Received instructions for my participation in the procedure from the performing physician.  

## 2022-04-25 NOTE — Patient Instructions (Addendum)
I found a long polypoid lesion that I think is healing inflammation (best guess) - biopsies taken. One small red area - biopsied. Also took biopsies of normal areas.  I will be gone x 2 weeks but will have someone check the pathology.  It is good news that 1 Lomotil really worked.  I appreciate the opportunity to care for you. Gatha Mayer, MD, FACG  YOU HAD AN ENDOSCOPIC PROCEDURE TODAY AT [facility]:   Refer to the procedure report that was given to you for any specific questions about what was found during the examination.  If the procedure report does not answer your questions, please call your gastroenterologist to clarify.  If you requested that your care partner not be given the details of your procedure findings, then the procedure report has been included in a sealed envelope for you to review at your convenience later.  YOU SHOULD EXPECT: Some feelings of bloating in the abdomen. Passage of more gas than usual.  Walking can help get rid of the air that was put into your GI tract during the procedure and reduce the bloating. If you had a lower endoscopy (such as a colonoscopy or flexible sigmoidoscopy) you may notice spotting of blood in your stool or on the toilet paper. If you underwent a bowel prep for your procedure, you may not have a normal bowel movement for a few days.  Please Note:  You might notice some irritation and congestion in your nose or some drainage.  This is from the oxygen used during your procedure.  There is no need for concern and it should clear up in a day or so.  SYMPTOMS TO REPORT IMMEDIATELY:  Following lower endoscopy (colonoscopy or flexible sigmoidoscopy):  Excessive amounts of blood in the stool  Significant tenderness or worsening of abdominal pains  Swelling of the abdomen that is new, acute  Fever of 100F or higher   For urgent or emergent issues, a gastroenterologist can be reached at any hour by calling [phone]. Do  not use MyChart messaging for urgent concerns.    DIET:  We do recommend a small meal at first, but then you may proceed to your regular diet.  Drink plenty of fluids but you should avoid alcoholic beverages for 24 hours.  ACTIVITY:  You should plan to take it easy for the rest of today and you should NOT DRIVE or use heavy machinery until tomorrow (because of the sedation medicines used during the test).    FOLLOW UP: Our staff will call the number listed on your records the next business day following your procedure.  We will call around 7:15- 8:00 am to check on you and address any questions or concerns that you may have regarding the information given to you following your procedure. If we do not reach you, we will leave a message.     If any biopsies were taken you will be contacted by phone or by letter within the next 1-3 weeks.  Please call us at [phone] if you have not heard about the biopsies in 3 weeks.    SIGNATURES/CONFIDENTIALITY: You and/or your care partner have signed paperwork which will be entered into your electronic medical record.  These signatures attest to the fact that that the information above on your After Visit Summary has been reviewed and is understood.  Full responsibility of the confidentiality of this discharge information lies with you and/or your care-partner.

## 2022-04-26 ENCOUNTER — Telehealth: Payer: Self-pay | Admitting: *Deleted

## 2022-04-26 NOTE — Telephone Encounter (Signed)
  Follow up Call-     04/25/2022    7:16 AM  Call back number  Post procedure Call Back phone  # [phone]  Permission to leave phone message Yes     Patient questions:  Do you have a fever, pain , or abdominal swelling? No. Pain Score  0 *  Have you tolerated food without any problems? Yes.    Have you been able to return to your normal activities? Yes.    Do you have any questions about your discharge instructions: Diet   No. Medications  No. Follow up visit  No.  Do you have questions or concerns about your Care? No.  Actions: * If pain score is 4 or above: No action needed, pain <4.

## 2022-05-01 ENCOUNTER — Telehealth: Payer: Self-pay

## 2022-05-01 DIAGNOSIS — G629 Polyneuropathy, unspecified: Secondary | ICD-10-CM | POA: Diagnosis not present

## 2022-05-01 DIAGNOSIS — M25552 Pain in left hip: Secondary | ICD-10-CM | POA: Diagnosis not present

## 2022-05-01 DIAGNOSIS — M5126 Other intervertebral disc displacement, lumbar region: Secondary | ICD-10-CM | POA: Diagnosis not present

## 2022-05-01 DIAGNOSIS — M5416 Radiculopathy, lumbar region: Secondary | ICD-10-CM | POA: Diagnosis not present

## 2022-05-01 DIAGNOSIS — M810 Age-related osteoporosis without current pathological fracture: Secondary | ICD-10-CM | POA: Diagnosis not present

## 2022-05-01 MED ORDER — BUDESONIDE 3 MG PO CPEP: ORAL_CAPSULE | ORAL | 0 refills | Status: DC

## 2022-05-01 NOTE — Telephone Encounter (Signed)
-----   Message from Gatha Mayer, MD sent at 04/25/2022  9:23 PM EDT ----- Regarding: Pathology review by DOD Remo Lipps,  Please look out for the pathology on this lady and have DOD review and make any recommendations. She had an odd possible polyp vs healing inflammation and some other biopsies to f/u on.  She was doing ok on Lomotil.  Thanks  CEG

## 2022-05-01 NOTE — Telephone Encounter (Signed)
Budesonide prescription sent to [facility] pharmacy on file.  3-week follow up call reminder sent to [facility], [place].

## 2022-05-01 NOTE — Addendum Note (Signed)
Addended by: Yevette Edwards on: 05/01/2022 02:58 PM   Modules accepted: Orders

## 2022-05-01 NOTE — Telephone Encounter (Signed)
Dr. Candis Schatz as DOD AM of 05/01/22 will you please review patient's path results as Dr. Carlean Purl is out of the office. Thank you

## 2022-05-08 ENCOUNTER — Telehealth: Payer: Self-pay | Admitting: Internal Medicine

## 2022-05-08 NOTE — Telephone Encounter (Signed)
Dr. Carlean Purl pt Please see note below from pt: Please advise as DOD

## 2022-05-08 NOTE — Telephone Encounter (Signed)
Pt was made aware of Dr. Rush Landmark recommendations:  Pt verbalized understanding with all questions answered.

## 2022-05-08 NOTE — Telephone Encounter (Signed)
Inbound call from patient wondering if she can get flu shot, covid, and rsv shot since she is taking budesonide. Please advise.

## 2022-05-08 NOTE — Telephone Encounter (Signed)
There is no contraindication to her flu shot or COVID shot. The RSV shot is a newer vaccination and would have to defer that to the primary care providers who may have better understanding of its utility/use; but from what I can understand it is an inactivated RSV protein rather than an active vaccine so should not have any issues. However, I would likely recommend, that she does not do more than 2 of them together at once. GM

## 2022-05-10 DIAGNOSIS — M5416 Radiculopathy, lumbar region: Secondary | ICD-10-CM | POA: Diagnosis not present

## 2022-05-13 ENCOUNTER — Encounter: Payer: Self-pay | Admitting: Internal Medicine

## 2022-05-13 DIAGNOSIS — K52831 Collagenous colitis: Secondary | ICD-10-CM | POA: Insufficient documentation

## 2022-05-13 HISTORY — DX: Collagenous colitis: K52.831

## 2022-05-15 DIAGNOSIS — E876 Hypokalemia: Secondary | ICD-10-CM | POA: Diagnosis not present

## 2022-05-15 DIAGNOSIS — Z23 Encounter for immunization: Secondary | ICD-10-CM | POA: Diagnosis not present

## 2022-05-15 DIAGNOSIS — I10 Essential (primary) hypertension: Secondary | ICD-10-CM | POA: Diagnosis not present

## 2022-05-15 DIAGNOSIS — K52839 Microscopic colitis, unspecified: Secondary | ICD-10-CM | POA: Diagnosis not present

## 2022-05-21 ENCOUNTER — Ambulatory Visit: Payer: PPO | Admitting: Internal Medicine

## 2022-05-21 ENCOUNTER — Encounter: Payer: Self-pay | Admitting: Internal Medicine

## 2022-05-21 VITALS — BP 120/76 | HR 58 | Ht 64.0 in | Wt 148.8 lb

## 2022-05-21 DIAGNOSIS — Z8639 Personal history of other endocrine, nutritional and metabolic disease: Secondary | ICD-10-CM | POA: Diagnosis not present

## 2022-05-21 DIAGNOSIS — E890 Postprocedural hypothyroidism: Secondary | ICD-10-CM | POA: Diagnosis not present

## 2022-05-21 LAB — T4, FREE: Free T4: 1.06 ng/dL (ref 0.60–1.60)

## 2022-05-21 LAB — TSH: TSH: 1.4 u[IU]/mL (ref 0.35–5.50)

## 2022-05-21 MED ORDER — LEVOTHYROXINE SODIUM 75 MCG PO TABS: 75.0000 ug | ORAL_TABLET | Freq: Every day | ORAL | 3 refills | Status: DC

## 2022-05-21 NOTE — Patient Instructions (Signed)
Please continue Levothyroxine 75 mcg daily.  Take the thyroid hormone every day, with water, at least 30 minutes before breakfast, separated by at least 4 hours from: - acid reflux medications - calcium - iron - multivitamins  Please stop at the lab.  You should have an endocrinology follow-up appointment in 1 year.

## 2022-05-21 NOTE — Progress Notes (Signed)
Patient ID: [name], female   DOB: 02/09/1941, 81 y.o.   MRN: [id number]  HPI  [name] is a 81 y.o.-year-old female, returning for f/u for h/o Graves ds and now postablative hypothyroidism. Last visit 1 year ago.  Interim history: She does have dry eyes, which is chronic for her. Since last visit, she had a sacral fracture 08/2021.  She continues on Prolia. She was in PT. She had an epidural inj. 1 week ago. This helps. 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  She recovered well.  Reviewed history: Patient was admitted to the hospital 06/2013 with Takotsubo cardiomyopathy, and a low TSH was found incidentally. She had cardiac catheterization on 06/17/2013 (after blood for TSH was drawn). She was also found to have atrial fibrillation in the hospital, now resolved (was on Coreg in the past and continues now). The patient's EF also normalized before discharge.  She was scheduled to have a hospital followup appointment with her PCP. Dr. Regis Bill repeated a TSH level and added a free T4 and a free T3 and these returned abnormal, suggesting thyrotoxicosis. Of note, her pulse was in the 50s and she was not in atrial fibrillation at the time of the appointment. Patient was referred to endocrinology for for further management.  Thyroid Uptake and scan (12/28/2013): Markedly elevated 24 hr radio iodine uptake of 82% Normal thyroid scan. Findings consistent with Graves disease.  Pt was initially on MMI, then had RAI Tx 01/26/2014 >> developed post ablative hypothyroidism  Pt is currently on levothyroxine 75 mcg daily, taken: - ~4:30 AM - fasting - coffee + cream > 30 min later - at least 30 min from b'fast - no Fe, MVI - stopped Protonix  - + Calcium with lunch and dinner - + Biotin 5000 mcg daily - stopped 10 days ago - + B complex  - inconsistently  Reviewed her TFTs: Lab Results  Component Value Date   TSH 1.77 05/16/2021   TSH 0.79 05/23/2020   TSH  1.47 11/10/2019   TSH 8.69 (H) 09/29/2019   TSH 3.88 06/29/2019   TSH 0.27 (L) 05/18/2019   TSH 0.49 11/20/2018   TSH 0.53 05/22/2018   TSH 1.08 11/18/2017   TSH 3.110 04/09/2017   FREET4 1.03 05/16/2021   FREET4 0.98 05/23/2020   FREET4 1.02 11/10/2019   FREET4 0.86 09/29/2019   FREET4 0.86 06/29/2019   FREET4 1.39 05/18/2019   FREET4 1.22 11/20/2018   FREET4 1.20 05/22/2018   FREET4 1.02 11/18/2017   FREET4 1.02 08/22/2015  fT3 on 06/26/2013: 6.4 (2.3 - 4.2)   TSI's are elevated: Lab Results  Component Value Date   TSI 472 (H) 05/18/2019    Pt denies: - feeling nodules in neck - hoarseness - dysphagia - choking  She also has a h/o HTN, HL, tricuspid regurgitation, mitral valve prolapse, PMR. She had cataract sx. 06/2018.  She has dry eyes. She sees Dr. Ellie Lunch.  She occasionally uses steroid drops. She is off carvedilol due to fatigue and bradycardia. She has a h/o L4 burst compression fx >> Gabapentin.  She continues on Prolia.  She tolerates this well.  This is managed in [place].  ROS: + See HPI  I reviewed pt's medications, allergies, PMH, social hx, family hx, and changes were documented in the history of present illness. Otherwise, unchanged from my initial visit note.  Past Medical History:  Diagnosis Date   Abnormal EKG    Atrial fibrillation (HCC)    Basal  cell carcinoma of nose    removed w/MOHs   CAD (coronary artery disease)    a. 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 EF 30-35% at [facility] 06/2013;  b.  f/u Echo (06/18/13):  EF 60-65%, normal wall motion, Gr 1 DD, mild MVP of post leaflet, mod TR, PASP 63   Thyroid disease    Tricuspid regurgitation    mild to moderate, echo, January, 2011, PA pressure 38 mm mercury   Past Surgical History:  Procedure Laterality Date   CARDIAC CATHETERIZATION  06/17/2013   COLONOSCOPY  12/23/2003   normal (indications: prior adenomas and grandfather with colon cancer)   COLPOSCOPY     EXCISIONAL HEMORRHOIDECTOMY  2003   LEFT HEART CATHETERIZATION WITH CORONARY ANGIOGRAM N/A 06/17/2013   Procedure: LEFT HEART CATHETERIZATION WITH CORONARY ANGIOGRAM;  Surgeon: Peter M Martinique, MD;  Location: [facility] CATH LAB;  Service: Cardiovascular;  Laterality: N/A;   MOHS SURGERY Left 2011   "side of my nose" (06/17/2013)   TONSILLECTOMY     TOTAL KNEE ARTHROPLASTY  08/2010   TOTAL KNEE ARTHROPLASTY  02/25/2012   Procedure: TOTAL KNEE ARTHROPLASTY;  Surgeon: Gearlean Alf, MD;  Location: [facility] ORS;  Service: Orthopedics;  Laterality: Right;   Social History   Socioeconomic History   Marital status: Widowed    Spouse name: Not on file   Number of children: 1   Years of education: Not on file   Highest education level: Not on file   Occupational History   Occupation: Homemaker  Tobacco Use   Smoking status: Never   Smokeless tobacco: Never  Vaping Use   Vaping Use: Never used  Substance and Sexual Activity   Alcohol use: Yes    Alcohol/week: 0.0 standard drinks of alcohol    Comment: Rare   Drug use: No   Sexual activity: Never    Birth control/protection: Post-menopausal    Comment: 1st intercourse 81 yo-Fewer than 5 partners  Other Topics Concern   Not on file  Social History Narrative   3  caffeine drinks daily    Widowed   Retired      Lives alone   Right handed   Caffeine: 1.5 cup/day   Social Determinants of Radio broadcast assistant Strain: Not on file  Food Insecurity: Not on file  Transportation Needs: Not on file  Physical Activity: Not on file  Stress: Not on file  Social Connections: Not on file  Intimate Partner Violence: Not on file   Current Outpatient Medications on File Prior to Visit  Medication Sig Dispense Refill   ALPHA LIPOIC ACID PO Take by mouth.     amLODipine (NORVASC) 5 MG tablet Take 1 tablet (5 mg total) by mouth daily. 90 tablet 3   atorvastatin (LIPITOR) 20 MG tablet Take 1 tablet (20 mg total) by mouth daily. 90 tablet 3   Biotin 5000 MCG CAPS Take 1 capsule by mouth daily.      budesonide (ENTOCORT EC) 3 MG 24 hr capsule Take 3 capsules (9 mg total) by mouth once daily 63 capsule 0   Calcium Carbonate (CALTRATE 600 PO) Take 1 tablet by mouth 2 (two) times a day.      Cholecalciferol 25 MCG (1000 UT) capsule Take 1,000 Units by mouth daily.     denosumab (PROLIA) 60 MG/ML SOSY injection Inject 60 mg into the skin every 6 (six) months.     diclofenac sodium (VOLTAREN) 1 % GEL 1 application.  2   diphenoxylate-atropine (LOMOTIL) 2.5-0.025 MG tablet Take 1 tablet by mouth 4 (four) times daily as needed for diarrhea or loose stools. 90 tablet 0   enalapril (VASOTEC) 20 MG tablet Take 1 tablet (20 mg total) by mouth 2 (two) times daily. 180 tablet 3   EVENING  PRIMROSE OIL PO Take 1 capsule by mouth 2 (two) times a day.      gabapentin (NEURONTIN) 300 MG capsule Take 3 capsules (900 mg total) by mouth 3 (three) times daily. 270 capsule 3   hydrALAZINE (APRESOLINE) 50 MG tablet Take 1 tablet (50 mg total) by mouth 2 (two) times daily. 180 tablet 3   hydrochlorothiazide (HYDRODIURIL) 25 MG tablet Take 1 tablet (25 mg total) by mouth daily. 90 tablet 3   levothyroxine (SYNTHROID) 75 MCG tablet TAKE 1 TABLET BY MOUTH EVERY DAY 90 tablet 2   meloxicam (MOBIC) 15 MG tablet Take 15 mg by mouth daily. (Patient not taking: Reported on 04/25/2022)     methylcellulose packet Take 1 each by mouth daily. CITRICEL     metroNIDAZOLE (METROCREAM) 0.75 % cream Apply 1 application topically 2 (two) times daily.     NONFORMULARY OR COMPOUNDED ITEM Compound cream: Meloxicam 0.5% Doxepin 3% Amantadine 3% Dextromethorphan 2% Lidocaine 2%  Sig: Apply 1-2 grams to the affected area 3-4 times daily. Dispense 240 grams with 5 refills.     pantoprazole (PROTONIX) 40 MG tablet Take 1 tablet by mouth daily.     potassium chloride SA (KLOR-CON M) 20 MEQ tablet Take 1 tablet (20 mEq total) by mouth daily for 14 days. 14 tablet 0   tretinoin (RETIN-A) 0.05 % cream Apply 1 application topically at bedtime.     zolpidem (AMBIEN) 5 MG tablet Take 5 mg by mouth at bedtime as needed for sleep. (Patient not taking: Reported on 04/25/2022)     No current facility-administered medications on file prior to visit.   No Known Allergies Family History  Problem Relation Age of Onset   Heart attack Mother 39   Hypertension Mother  Heart disease Mother    Stroke Mother    Heart attack Father 29   Coronary artery disease Father        Multiple Family Members   Hypertension Father    Heart disease Father    Esophageal cancer Paternal Grandmother    Colon cancer Maternal Grandfather    Heart disease Maternal Grandfather    Irritable bowel syndrome Sister        More family members  on father side of    Hypertension Sister    Heart disease Sister    Breast cancer Sister 13   Breast cancer Paternal Aunt        Age 54's   Heart disease Paternal Grandfather    Rectal cancer Neg Hx    Stomach cancer Neg Hx     PE: BP 120/76 (BP Location: Right Arm, Patient Position: Sitting, Cuff Size: Normal)   Pulse (!) 58   Ht '5\' 4"'$  (1.626 m)   Wt 148 lb 12.8 oz (67.5 kg)   SpO2 99%   BMI 25.54 kg/m   Wt Readings from Last 3 Encounters:  05/21/22 148 lb 12.8 oz (67.5 kg)  04/25/22 148 lb (67.1 kg)  04/20/22 148 lb 6.4 oz (67.3 kg)   Constitutional: normal weight, in NAD Eyes: EOMI, no exophthalmos ENT: no thyromegaly, no cervical lymphadenopathy Cardiovascular: RRR, No MRG Respiratory: CTA B Musculoskeletal: no deformities Skin: no rashes Neurological: no tremor with outstretched hands  ASSESSMENT: 1. H/o Graves ds  2. Postablative hypothyroidism  3. S/p L4 fracture - She fell backwards >> L4 compression fracture.  - She was on Reclast >> this year was her fourth drug holiday year. - Managed by Dr. Marcelino Scot in [place] - Discussed pros and cons of Prolia >> started it and continues on this  PLAN:  1. Patient with history of Graves' disease, status post RAI treatment, now with post ablative hypothyroidism -No signs of active Graves' ophthalmopathy: No exophthalmos, chemosis, double vision, eye pain, but she does have dry eyes, which is chronic for her. -Her TSI antibodies were still elevated at last check -I do not absolutely feel that we need to repeat this today  2. Postablative hypothyroidism - latest thyroid labs reviewed with pt. >> normal: Lab Results  Component Value Date   TSH 1.77 05/16/2021  - she continues on LT4 75 mcg daily - pt feels good on this dose. - we discussed about taking the thyroid hormone every day, with water, >30 minutes before breakfast, separated by >4 hours from acid reflux medications, calcium, iron, multivitamins. Pt. is  taking it correctly. - will check thyroid tests today: TSH and fT4 - If labs are abnormal, she will need to return for repeat TFTs in 1.5 months  Will need refills.  Component     Latest Ref Rng 05/21/2022  TSH     0.35 - 5.50 uIU/mL 1.40   T4,Free(Direct)     0.60 - 1.60 ng/dL 1.06    Normal TFTs.  Philemon Kingdom, MD PhD [facility] Endocrinology

## 2022-05-21 NOTE — Telephone Encounter (Signed)
Patient called states she is almost done with the Budesonide medication seeking advise on hat her next treatment\steps will be.

## 2022-05-22 ENCOUNTER — Ambulatory Visit: Payer: Medicare Other | Admitting: Internal Medicine

## 2022-05-22 ENCOUNTER — Telehealth: Payer: Self-pay

## 2022-05-22 ENCOUNTER — Ambulatory Visit: Payer: PPO | Admitting: Neurology

## 2022-05-22 NOTE — Telephone Encounter (Signed)
Please complete botox PA.  Toxin: Botox 100 units  Dx: S93.24  Provider: Dr. Sarina Ill  Please confirm SP or B/B.

## 2022-05-22 NOTE — Telephone Encounter (Signed)
Spoke with pt. Documented in additional phone note:

## 2022-05-22 NOTE — Telephone Encounter (Signed)
Pt made aware of Dr. Gessner recommendations: Pt verbalized understanding with all questions answered.   

## 2022-05-22 NOTE — Telephone Encounter (Signed)
-----   Message from Yevette Edwards, RN sent at 05/01/2022  2:55 PM EDT ----- Regarding: Follow up call Please contact the patient in to assess diarrhea and determine whether to continue full dose, versus start taper of Budesonide. See 05/01/22 telephone encounter for details

## 2022-05-22 NOTE — Telephone Encounter (Signed)
Please refer to phone note on 05/01/2022 Pt stated that this she has finished her prescription of the Budesonide today: Pt states that she is feeling good, no diarrhea, BM are formed, last BM today. Pt seeking advise on  her next treatment\steps will be if any: Please advise

## 2022-05-22 NOTE — Telephone Encounter (Signed)
We will observe - sometimes need recurrent treatment - if she begins to have diarrhea again she should let me know.

## 2022-05-30 ENCOUNTER — Ambulatory Visit: Payer: PPO | Admitting: Neurology

## 2022-05-30 DIAGNOSIS — M25552 Pain in left hip: Secondary | ICD-10-CM | POA: Diagnosis not present

## 2022-05-30 DIAGNOSIS — M5416 Radiculopathy, lumbar region: Secondary | ICD-10-CM | POA: Diagnosis not present

## 2022-05-30 DIAGNOSIS — G629 Polyneuropathy, unspecified: Secondary | ICD-10-CM | POA: Diagnosis not present

## 2022-05-30 DIAGNOSIS — M5126 Other intervertebral disc displacement, lumbar region: Secondary | ICD-10-CM | POA: Diagnosis not present

## 2022-05-31 ENCOUNTER — Ambulatory Visit: Payer: PPO | Admitting: Neurology

## 2022-05-31 DIAGNOSIS — G6289 Other specified polyneuropathies: Secondary | ICD-10-CM

## 2022-05-31 MED ORDER — ONABOTULINUMTOXINA 200 UNITS IJ SOLR
155.0000 [IU] | Freq: Once | INTRAMUSCULAR | Status: AC
  Administered 2022-05-31: 155 [IU] via INTRAMUSCULAR

## 2022-05-31 NOTE — Progress Notes (Signed)
Botox- 200 units x 1 vial Lot: [id number] Expiration: 09/2024 NDC: 8677-3736-68  Bacteriostatic 0.9% Sodium Chloride- 57m total Lot: [id number]: 04/07/2023 NDC: [address]-37 Dx: GD57.89 B/B

## 2022-06-03 NOTE — Progress Notes (Signed)
USE 200 U and spray with numbing cream!! Can put 200 units into 2 syringes  10.26/2023: Significanty helping. Concentrate into 2 syringes. >> 60% improvement in pain  03/05/2022: Significanty helping. Concentrate into 2 syringes. >> 60% improvement in pain  In the balls of the feet and instep of the right foot (flexor digiotorum brevis, all lumbricals)  11/07/2021: After 3 months the severe pain returned, botox is making a significant difference in pain > 60% improvement in pain. It was best to keep her feet on the chairs and dorsiflex them while I inject bc it hurts a lot. Left foot anterior pad of lower foot and right underneath the toes, on the right the anterior pad of lower foot and part of the instep medially. Keep feet on chair and hold foot in dorsiflexion position tightly while injecting helped with the injection pain.  07/16/2021: unclear how much improvement. We will stop botox for now and see if she reports increased pain when it wears off 04/11/2021: Botox works great > 60% improvement in pain and quality of life, she can feel it wearing off. 01/04/2021: > 50% improvement in pain, continue current dose 10/04/2020: first injections  History: Refractory idiopathic peripheral diabetic polyneuropathy. 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 Hx of diabetic(per-diabetic) neuropathy.   Procedure: All procedures documented were medically necessary, reasonable and appropriate based on the patient's history, medical diagnosis and physician opinion. Verbal informed consent was obtained from the patient, patient was informed of potential risk of procedure, including bruising, bleeding, hematoma formation, infection, muscle weakness, muscle pain, numbness, transient hypertension, transient hyperglycemia and transient insomnia among  others. All areas injected were topically clean with isopropyl rubbing alcohol. Nonsterile nonlatex gloves were worn during the procedure.   Injections were given bilaterally to the plantar surface of the feet.  The feet were properly sterilized with evenly spaced sites bilaterally injected each with 10 units of botox using a 1 ml 30-gauge needle.  200 units of Botox were used and none was wasted.  Dx: G62.89 B/B 56433 CPT code and [phone] for additional limb

## 2022-07-04 ENCOUNTER — Telehealth: Payer: Self-pay | Admitting: Internal Medicine

## 2022-07-04 NOTE — Telephone Encounter (Signed)
Inbound call from patient stating that she has been having diarrhea since Thanksgiving and has been taking Lomotil twice daily  and wants to know if she should continue doing so. Requesting a call back to discuss. Please advise.

## 2022-07-04 NOTE — Telephone Encounter (Signed)
She should take budesonide ('3mg'$  caps) at 9 mg daily x 1 month (#90)and 1 refill to treat her collagenous colitis  OV me next available non-urgent

## 2022-07-04 NOTE — Telephone Encounter (Signed)
Pt stated that she started having diarrhea since Thanksgiving: Taking two Lomotil a day which is helping some but the diarrhea is not resolving: Pt states that with the Lomotil that she is still having two loose stools a day: No pain, No nausea: Last BM today: Please advise

## 2022-07-05 ENCOUNTER — Other Ambulatory Visit: Payer: Self-pay

## 2022-07-05 DIAGNOSIS — R197 Diarrhea, unspecified: Secondary | ICD-10-CM

## 2022-07-05 DIAGNOSIS — K52831 Collagenous colitis: Secondary | ICD-10-CM

## 2022-07-05 MED ORDER — BUDESONIDE 3 MG PO CPEP: 9.0000 mg | ORAL_CAPSULE | Freq: Every day | ORAL | 0 refills | Status: DC

## 2022-07-05 MED ORDER — BUDESONIDE 3 MG PO CPEP: 9.0000 mg | ORAL_CAPSULE | Freq: Every day | ORAL | 0 refills | Status: AC

## 2022-07-05 NOTE — Telephone Encounter (Signed)
Pt was made aware of Dr. Carlean Purl recommendations: Prescription sent to pharmacy: Pt made aware: Pt was scheduled for an office visit on 08/16/2022 at 10:10 AM  Pt verbalized understanding with all questions answered.

## 2022-07-06 ENCOUNTER — Other Ambulatory Visit: Payer: Self-pay | Admitting: Internal Medicine

## 2022-07-06 DIAGNOSIS — R197 Diarrhea, unspecified: Secondary | ICD-10-CM

## 2022-07-06 DIAGNOSIS — K52831 Collagenous colitis: Secondary | ICD-10-CM

## 2022-07-19 DIAGNOSIS — M810 Age-related osteoporosis without current pathological fracture: Secondary | ICD-10-CM | POA: Diagnosis not present

## 2022-07-19 DIAGNOSIS — I10 Essential (primary) hypertension: Secondary | ICD-10-CM | POA: Diagnosis not present

## 2022-07-24 DIAGNOSIS — Z23 Encounter for immunization: Secondary | ICD-10-CM | POA: Diagnosis not present

## 2022-07-24 DIAGNOSIS — N1831 Chronic kidney disease, stage 3a: Secondary | ICD-10-CM | POA: Diagnosis not present

## 2022-07-24 DIAGNOSIS — Z8679 Personal history of other diseases of the circulatory system: Secondary | ICD-10-CM | POA: Diagnosis not present

## 2022-07-24 DIAGNOSIS — Z0000 Encounter for general adult medical examination without abnormal findings: Secondary | ICD-10-CM | POA: Diagnosis not present

## 2022-07-24 DIAGNOSIS — G629 Polyneuropathy, unspecified: Secondary | ICD-10-CM | POA: Diagnosis not present

## 2022-07-24 DIAGNOSIS — K52839 Microscopic colitis, unspecified: Secondary | ICD-10-CM | POA: Diagnosis not present

## 2022-07-24 DIAGNOSIS — I10 Essential (primary) hypertension: Secondary | ICD-10-CM | POA: Diagnosis not present

## 2022-07-24 DIAGNOSIS — I700 Atherosclerosis of aorta: Secondary | ICD-10-CM | POA: Diagnosis not present

## 2022-07-24 DIAGNOSIS — I2510 Atherosclerotic heart disease of native coronary artery without angina pectoris: Secondary | ICD-10-CM | POA: Diagnosis not present

## 2022-07-24 DIAGNOSIS — M810 Age-related osteoporosis without current pathological fracture: Secondary | ICD-10-CM | POA: Diagnosis not present

## 2022-07-24 DIAGNOSIS — Z791 Long term (current) use of non-steroidal anti-inflammatories (NSAID): Secondary | ICD-10-CM | POA: Diagnosis not present

## 2022-07-24 DIAGNOSIS — E890 Postprocedural hypothyroidism: Secondary | ICD-10-CM | POA: Diagnosis not present

## 2022-08-16 ENCOUNTER — Telehealth: Payer: Self-pay | Admitting: Internal Medicine

## 2022-08-16 ENCOUNTER — Ambulatory Visit (INDEPENDENT_AMBULATORY_CARE_PROVIDER_SITE_OTHER): Payer: PPO | Admitting: Internal Medicine

## 2022-08-16 ENCOUNTER — Encounter: Payer: Self-pay | Admitting: Internal Medicine

## 2022-08-16 VITALS — BP 120/70 | HR 74 | Ht 64.0 in | Wt 147.1 lb

## 2022-08-16 DIAGNOSIS — K52831 Collagenous colitis: Secondary | ICD-10-CM

## 2022-08-16 DIAGNOSIS — R197 Diarrhea, unspecified: Secondary | ICD-10-CM

## 2022-08-16 MED ORDER — BUDESONIDE 3 MG PO CPEP: 9.0000 mg | ORAL_CAPSULE | Freq: Every day | ORAL | 1 refills | Status: AC

## 2022-08-16 NOTE — Patient Instructions (Addendum)
Fill your budesonide and keep it on hand as we discussed. We have sent in a refill.  If you need to restart it again let me know.  If you can get off meloxicam that would be good.  Have a great trip to [place] and the [place].  I appreciate the opportunity to care for you. Gatha Mayer, MD, [facility]

## 2022-08-16 NOTE — Telephone Encounter (Signed)
Inbound call from patient stating that she needs a refill sent In for Budesonide. Please advise.

## 2022-08-16 NOTE — Telephone Encounter (Signed)
Refill sent in as requested, [name] was seen today.

## 2022-08-16 NOTE — Progress Notes (Signed)
[name] 82 y.o. 05-13-1941 [id number]  Assessment & Plan:   Encounter Diagnoses  Name Primary?   Collagenous colitis Yes   Diarrhea, unspecified type    She has required treatment with budesonide twice now.  She seems to respond rapidly.  I explained to help this disorder has 3 patterns that I have identified in patients that some people are treated once and never have problems again others require some intermittent treatment with budesonide and others require chronic therapy.  We will see how it goes.  She requires meloxicam which can be a cause of this problem, she has pretty severe arthritis and has functional limitations unless she is taking that.  She is going to have another epidural and is going to see a new neurosurgeon since hers retired.  She is also had some kidney damage issues with the meloxicam and understands the need to try to stop it if she can.   She has a trip to [place] in April and I advised her even if she is feeling well to take budesonide with her and start that if she needs it.  In the meantime if she has to restart it she will let me know and we will sort through things and determine a longer term treatment plan otherwise.  I have reviewed the potential side effects of chronic steroid use should it be needed and also the risk-benefit ratio such.  In general budesonide does not have a strong side effect profile as prednisone does.  CC: Deland Pretty, MD   Subjective:   Chief Complaint: Collagenous colitis  HPI 82 year old white woman diagnosed with collagenous colitis at colonoscopy September 2023.  She was treated with budesonide and had a very nice response and then had relapse of symptoms in late November and has completed a month treatment again with the relatively rapid onset of symptom relief.  She continues to take meloxicam due to arthritis and back pain issues.  She is aware of the association of that with collagenous colitis. No Known  Allergies Current Meds  Medication Sig   amLODipine (NORVASC) 5 MG tablet Take 1 tablet (5 mg total) by mouth daily.   atorvastatin (LIPITOR) 20 MG tablet Take 1 tablet (20 mg total) by mouth daily.   Biotin 5000 MCG CAPS Take 1 capsule by mouth daily.    Calcium Carbonate (CALTRATE 600 PO) Take 1 tablet by mouth 2 (two) times a day.    Cholecalciferol 25 MCG (1000 UT) capsule Take 1,000 Units by mouth daily.   denosumab (PROLIA) 60 MG/ML SOSY injection Inject 60 mg into the skin every 6 (six) months.   diclofenac sodium (VOLTAREN) 1 % GEL 1 application.   enalapril (VASOTEC) 20 MG tablet Take 1 tablet (20 mg total) by mouth 2 (two) times daily.   EVENING PRIMROSE OIL PO Take 1 capsule by mouth 2 (two) times a day.    gabapentin (NEURONTIN) 300 MG capsule Take 3 capsules (900 mg total) by mouth 3 (three) times daily.   hydrALAZINE (APRESOLINE) 50 MG tablet Take 1 tablet (50 mg total) by mouth 2 (two) times daily.   hydrochlorothiazide (HYDRODIURIL) 25 MG tablet Take 1 tablet (25 mg total) by mouth daily.   levothyroxine (SYNTHROID) 75 MCG tablet Take 1 tablet (75 mcg total) by mouth daily.   meloxicam (MOBIC) 15 MG tablet Take 15 mg by mouth daily.   methylcellulose packet Take 1 each by mouth daily. CITRICEL   metroNIDAZOLE (METROCREAM) 0.75 % cream Apply 1  application topically 2 (two) times daily.   NONFORMULARY OR COMPOUNDED ITEM Compound cream: Meloxicam 0.5% Doxepin 3% Amantadine 3% Dextromethorphan 2% Lidocaine 2%  Sig: Apply 1-2 grams to the affected area 3-4 times daily. Dispense 240 grams with 5 refills.   pantoprazole (PROTONIX) 40 MG tablet Take 1 tablet by mouth daily.   tretinoin (RETIN-A) 0.05 % cream Apply 1 application topically at bedtime.   zolpidem (AMBIEN) 5 MG tablet Take 5 mg by mouth at bedtime as needed for sleep.   Past Medical History:  Diagnosis Date   Abnormal EKG    Atrial fibrillation (HCC)    Basal cell carcinoma of nose    removed w/MOHs   CAD  (coronary artery disease)    a. 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 EF 30-35% at [facility] 06/2013;  b.  f/u Echo (06/18/13):  EF 60-65%, normal wall motion, Gr 1 DD, mild MVP of post leaflet, mod TR, PASP 63   Thyroid disease    Tricuspid regurgitation    mild to moderate, echo, January, 2011, PA pressure 38 mm mercury   Past Surgical History:  Procedure Laterality Date   CARDIAC CATHETERIZATION  06/17/2013   COLONOSCOPY  12/23/2003   normal (indications: prior adenomas and grandfather with colon cancer)   COLPOSCOPY     EXCISIONAL HEMORRHOIDECTOMY  2003   LEFT HEART CATHETERIZATION WITH CORONARY ANGIOGRAM N/A 06/17/2013   Procedure: LEFT HEART CATHETERIZATION WITH CORONARY ANGIOGRAM;  Surgeon: Peter M  Martinique, MD;  Location: [facility] CATH LAB;  Service: Cardiovascular;  Laterality: N/A;   MOHS SURGERY Left 2011   "side of my nose" (06/17/2013)   TONSILLECTOMY     TOTAL KNEE ARTHROPLASTY  08/2010   TOTAL KNEE ARTHROPLASTY  02/25/2012   Procedure: TOTAL KNEE ARTHROPLASTY;  Surgeon: Gearlean Alf, MD;  Location: [facility];  Service: Orthopedics;  Laterality: Right;   Social History   Social History Narrative   3 caffeine drinks daily    Widowed   Retired      Lives alone  Right handed   Caffeine: 1.5 cup/day   family history includes Breast cancer in her paternal aunt; Breast cancer (age of onset: 83) in her sister; Colon cancer in her maternal grandfather; Coronary artery disease in her father; Esophageal cancer in her paternal grandmother; Heart attack (age of onset: 33) in her mother; Heart attack (age of onset: 42) in her father; Heart disease in her father, maternal grandfather, mother, paternal grandfather, and sister; Hypertension in her father, mother, and sister; Irritable bowel syndrome in her sister; Stroke in her mother.   Review of Systems  As above Objective:   Physical Exam BP 120/70   Pulse 74   Ht '5\' 4"'$  (1.626 m)   Wt 147 lb 2 oz (66.7 kg)   BMI 25.25 kg/m   25 minutes total time

## 2022-08-22 DIAGNOSIS — M79671 Pain in right foot: Secondary | ICD-10-CM | POA: Diagnosis not present

## 2022-08-22 DIAGNOSIS — M84374A Stress fracture, right foot, initial encounter for fracture: Secondary | ICD-10-CM | POA: Diagnosis not present

## 2022-08-23 DIAGNOSIS — M5416 Radiculopathy, lumbar region: Secondary | ICD-10-CM | POA: Diagnosis not present

## 2022-08-27 ENCOUNTER — Other Ambulatory Visit (HOSPITAL_COMMUNITY): Payer: Self-pay

## 2022-08-27 NOTE — Telephone Encounter (Signed)
  Update for 2024 Botox w/ current HTA plan

## 2022-08-27 NOTE — Telephone Encounter (Signed)
Can you run benefits review again and confirm if still no auth needed and that she is buy and bill? She has an appt tomorrow 1/23.

## 2022-08-28 ENCOUNTER — Ambulatory Visit (INDEPENDENT_AMBULATORY_CARE_PROVIDER_SITE_OTHER): Payer: PPO | Admitting: Neurology

## 2022-08-28 DIAGNOSIS — G6289 Other specified polyneuropathies: Secondary | ICD-10-CM | POA: Diagnosis not present

## 2022-08-28 DIAGNOSIS — H00014 Hordeolum externum left upper eyelid: Secondary | ICD-10-CM | POA: Diagnosis not present

## 2022-08-28 MED ORDER — ONABOTULINUMTOXINA 200 UNITS IJ SOLR
200.0000 [IU] | Freq: Once | INTRAMUSCULAR | Status: AC
  Administered 2022-08-28: 200 [IU] via INTRAMUSCULAR

## 2022-08-28 NOTE — Progress Notes (Signed)
Botox consent signed Botox- 200 units x 1 vial Lot: [id number] Expiration: 01/2025 NDC: 2876-8115-72  Bacteriostatic 0.9% Sodium Chloride- 2 mL total Lot: [id number] Expiration: 11/25 NDC: 74163-845-36  Dx: I68.03 B/B

## 2022-08-28 NOTE — Telephone Encounter (Signed)
Patient is leaving for [place] on the 15th and would like to know if she can have her botox the prior week. I'm just not sure insurance will pay for botox since I do not think the 12 weeks will have passed. Please ask botox team, thanks.

## 2022-08-28 NOTE — Progress Notes (Signed)
USE 200 U and spray with numbing cream!! Can put 200 units into 2 syringes  1.23.2024: working significantly well, >>50% improvement in pain. Unfortunately she leaves for [place] on April 15th, not sure we can rebotox before then (12 weeks have to pass)  10.26/2023: Significanty helping. Concentrate into 2 syringes. >> 60% improvement in pain  03/05/2022: Significanty helping. Concentrate into 2 syringes. >> 60% improvement in pain  In the balls of the feet and instep of the right foot (flexor digiotorum brevis, all lumbricals)  11/07/2021: After 3 months the severe pain returned, botox is making a significant difference in pain > 60% improvement in pain. It was best to keep her feet on the chairs and dorsiflex them while I inject bc it hurts a lot. Left foot anterior pad of lower foot and right underneath the toes, on the right the anterior pad of lower foot and part of the instep medially. Keep feet on chair and hold foot in dorsiflexion position tightly while injecting helped with the injection pain.  07/16/2021: unclear how much improvement. We will stop botox for now and see if she reports increased pain when it wears off 04/11/2021: Botox works great > 60% improvement in pain and quality of life, she can feel it wearing off. 01/04/2021: > 50% improvement in pain, continue current dose 10/04/2020: first injections  History: Refractory idiopathic peripheral diabetic polyneuropathy. 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 Hx of diabetic(per-diabetic) neuropathy.   Procedure: All procedures documented were medically necessary, reasonable and appropriate based on the patient's history, medical diagnosis and physician opinion. Verbal informed consent was obtained from the patient, patient was informed of potential risk of  procedure, including bruising, bleeding, hematoma formation, infection, muscle weakness, muscle pain, numbness, transient hypertension, transient hyperglycemia and transient insomnia among others. All areas injected were topically clean with isopropyl rubbing alcohol. Nonsterile nonlatex gloves were worn during the procedure.   Injections were given bilaterally to the plantar surface of the feet.  The feet were properly sterilized with evenly spaced sites bilaterally injected each with 10 units of botox using a 1 ml 30-gauge needle.  200 units of Botox were used and none was wasted.  Dx: G62.89 B/B 62952 CPT code and [phone] for additional limb

## 2022-09-05 DIAGNOSIS — M5416 Radiculopathy, lumbar region: Secondary | ICD-10-CM | POA: Diagnosis not present

## 2022-09-10 DIAGNOSIS — M5125 Other intervertebral disc displacement, thoracolumbar region: Secondary | ICD-10-CM | POA: Diagnosis not present

## 2022-09-10 DIAGNOSIS — R262 Difficulty in walking, not elsewhere classified: Secondary | ICD-10-CM | POA: Diagnosis not present

## 2022-09-10 DIAGNOSIS — M25551 Pain in right hip: Secondary | ICD-10-CM | POA: Diagnosis not present

## 2022-09-11 DIAGNOSIS — Z961 Presence of intraocular lens: Secondary | ICD-10-CM | POA: Diagnosis not present

## 2022-09-11 DIAGNOSIS — H52203 Unspecified astigmatism, bilateral: Secondary | ICD-10-CM | POA: Diagnosis not present

## 2022-09-17 DIAGNOSIS — M25551 Pain in right hip: Secondary | ICD-10-CM | POA: Diagnosis not present

## 2022-09-17 DIAGNOSIS — R262 Difficulty in walking, not elsewhere classified: Secondary | ICD-10-CM | POA: Diagnosis not present

## 2022-09-17 DIAGNOSIS — M5125 Other intervertebral disc displacement, thoracolumbar region: Secondary | ICD-10-CM | POA: Diagnosis not present

## 2022-09-18 NOTE — Telephone Encounter (Signed)
I am suspicious that she had a transient infection and that her body is recovering from that - I think.  I would give it the rest of this week to see if she still needs to Lomotil. If still needing the lomotil next week ask that she let me know

## 2022-09-18 NOTE — Telephone Encounter (Signed)
Pt stated that she start having diarrhea on 09/07/2022 where she got really sick with Diarrhea and Vomiting.  The vomiting resolved although pt states that she is still having diarrhea every morning once or twice and then she takes the diphenoxylate-atropine (LOMOTIL) 2.5-0.025 MG tablet  once and she is fine the rest of the day. No pain, nausea, bloating. Please advise

## 2022-09-18 NOTE — Telephone Encounter (Signed)
Pt made aware of Dr. Gessner recommendations: Pt verbalized understanding with all questions answered.   

## 2022-09-18 NOTE — Telephone Encounter (Signed)
Inbound call from patient stating she has been having diarrhea every morning for the past week and would like some recommendations. Please advise.

## 2022-09-19 DIAGNOSIS — M5125 Other intervertebral disc displacement, thoracolumbar region: Secondary | ICD-10-CM | POA: Diagnosis not present

## 2022-09-19 DIAGNOSIS — R262 Difficulty in walking, not elsewhere classified: Secondary | ICD-10-CM | POA: Diagnosis not present

## 2022-09-19 DIAGNOSIS — M25551 Pain in right hip: Secondary | ICD-10-CM | POA: Diagnosis not present

## 2022-09-20 ENCOUNTER — Telehealth: Payer: Self-pay

## 2022-09-20 NOTE — Telephone Encounter (Signed)
Pt called in and stated that she is still having the diarrhea in the mornings.  Chart reviewed and noted Dr. Carlean Purl recommendations from earlier in the week:   I am suspicious that she had a transient infection and that her body is recovering from that - I think.   I would give it the rest of this week to see if she still needs to Lomotil. If still needing the lomotil next week ask that she let me know   Recommendations were repeated to pt once more. Pt was notified to ensure she is staying hydrated.  Pt verbalized understanding with all questions answered.

## 2022-09-21 DIAGNOSIS — M25551 Pain in right hip: Secondary | ICD-10-CM | POA: Diagnosis not present

## 2022-09-21 DIAGNOSIS — R262 Difficulty in walking, not elsewhere classified: Secondary | ICD-10-CM | POA: Diagnosis not present

## 2022-09-21 DIAGNOSIS — M5125 Other intervertebral disc displacement, thoracolumbar region: Secondary | ICD-10-CM | POA: Diagnosis not present

## 2022-09-24 ENCOUNTER — Other Ambulatory Visit (HOSPITAL_COMMUNITY): Payer: Self-pay

## 2022-09-24 DIAGNOSIS — M84374A Stress fracture, right foot, initial encounter for fracture: Secondary | ICD-10-CM | POA: Diagnosis not present

## 2022-09-25 NOTE — Telephone Encounter (Signed)
Patient called stating that she is still having diarrhea and is requesting a call back to discuss. Please advise.

## 2022-09-25 NOTE — Telephone Encounter (Signed)
Pt stated that she is still having diarrhea every morning averaging 2 times. Pt stated that she has been taking the Lomotil  as instructed. Please see notes below and advise

## 2022-09-25 NOTE — Telephone Encounter (Signed)
Have her restart budesonide 9 mg daily (3 capsules a day)  If she needs an Rx do that 3 mg caps # 90 w/ 2 refills  take 3 caps (84m) daily  Have her contact [place] in 1 week with symptom update she can stop Lomotil if she can (ok to use prn)

## 2022-09-25 NOTE — Telephone Encounter (Signed)
Pt made aware of Dr. Carlean Purl recommendations.  Pt stated that she does not think that she needs a prescription but she is not at home right now and that she will call us back if she see's that she needs one.  Pt notified to contact us again in 1 week with symptom update she can stop Lomotil if she can (ok to use prn)   Pt verbalized understanding with all questions answered.

## 2022-09-26 DIAGNOSIS — R262 Difficulty in walking, not elsewhere classified: Secondary | ICD-10-CM | POA: Diagnosis not present

## 2022-09-26 DIAGNOSIS — M25551 Pain in right hip: Secondary | ICD-10-CM | POA: Diagnosis not present

## 2022-09-26 DIAGNOSIS — M5125 Other intervertebral disc displacement, thoracolumbar region: Secondary | ICD-10-CM | POA: Diagnosis not present

## 2022-09-28 DIAGNOSIS — R262 Difficulty in walking, not elsewhere classified: Secondary | ICD-10-CM | POA: Diagnosis not present

## 2022-09-28 DIAGNOSIS — M25551 Pain in right hip: Secondary | ICD-10-CM | POA: Diagnosis not present

## 2022-09-28 DIAGNOSIS — M5125 Other intervertebral disc displacement, thoracolumbar region: Secondary | ICD-10-CM | POA: Diagnosis not present

## 2022-10-01 DIAGNOSIS — M25551 Pain in right hip: Secondary | ICD-10-CM | POA: Diagnosis not present

## 2022-10-01 DIAGNOSIS — R262 Difficulty in walking, not elsewhere classified: Secondary | ICD-10-CM | POA: Diagnosis not present

## 2022-10-01 DIAGNOSIS — M5125 Other intervertebral disc displacement, thoracolumbar region: Secondary | ICD-10-CM | POA: Diagnosis not present

## 2022-10-02 ENCOUNTER — Other Ambulatory Visit: Payer: Self-pay

## 2022-10-02 ENCOUNTER — Telehealth: Payer: Self-pay | Admitting: Internal Medicine

## 2022-10-02 DIAGNOSIS — R197 Diarrhea, unspecified: Secondary | ICD-10-CM

## 2022-10-02 DIAGNOSIS — K52831 Collagenous colitis: Secondary | ICD-10-CM

## 2022-10-02 MED ORDER — BUDESONIDE 3 MG PO CPEP: 9.0000 mg | ORAL_CAPSULE | Freq: Every day | ORAL | 2 refills | Status: DC

## 2022-10-02 NOTE — Telephone Encounter (Signed)
Patient is calling to request a call back from you.Marland KitchenPlease advise

## 2022-10-02 NOTE — Telephone Encounter (Signed)
Pt called in to give symptom update. Pt stated that her symptoms were better after starting the Budesonide 9 mg daily and she is back to normal . Pt requested prescription. Prescription sent to pharmacy.  Pt verbalized understanding with all questions answered.

## 2022-10-02 NOTE — Telephone Encounter (Signed)
Please see note below [facility]

## 2022-10-02 NOTE — Telephone Encounter (Signed)
OK - have her stay on that dose x 2 weeks then reduce to 6 mg or 2 capsules a day and she can call or message [place] with an update at end of march and we can come up with a longer term plan. Want to touch base before she goes to [place]. If she prefers an office visit around that time then we can do that

## 2022-10-03 DIAGNOSIS — M25551 Pain in right hip: Secondary | ICD-10-CM | POA: Diagnosis not present

## 2022-10-03 DIAGNOSIS — M5125 Other intervertebral disc displacement, thoracolumbar region: Secondary | ICD-10-CM | POA: Diagnosis not present

## 2022-10-03 DIAGNOSIS — R262 Difficulty in walking, not elsewhere classified: Secondary | ICD-10-CM | POA: Diagnosis not present

## 2022-10-03 NOTE — Telephone Encounter (Signed)
Pt made aware of Dr. Carlean Purl recommendations: Pt was scheduled for an office visit on 11/06/2022 at 10:10 AM. To see Dr. Carlean Purl. Pt made aware. Pt verbalized understanding with all questions answered.

## 2022-10-04 DIAGNOSIS — M65311 Trigger thumb, right thumb: Secondary | ICD-10-CM | POA: Diagnosis not present

## 2022-10-05 DIAGNOSIS — R262 Difficulty in walking, not elsewhere classified: Secondary | ICD-10-CM | POA: Diagnosis not present

## 2022-10-05 DIAGNOSIS — M5125 Other intervertebral disc displacement, thoracolumbar region: Secondary | ICD-10-CM | POA: Diagnosis not present

## 2022-10-05 DIAGNOSIS — M25551 Pain in right hip: Secondary | ICD-10-CM | POA: Diagnosis not present

## 2022-10-08 DIAGNOSIS — M5125 Other intervertebral disc displacement, thoracolumbar region: Secondary | ICD-10-CM | POA: Diagnosis not present

## 2022-10-08 DIAGNOSIS — M25551 Pain in right hip: Secondary | ICD-10-CM | POA: Diagnosis not present

## 2022-10-08 DIAGNOSIS — R262 Difficulty in walking, not elsewhere classified: Secondary | ICD-10-CM | POA: Diagnosis not present

## 2022-10-09 ENCOUNTER — Other Ambulatory Visit: Payer: Self-pay | Admitting: Obstetrics & Gynecology

## 2022-10-09 DIAGNOSIS — D2262 Melanocytic nevi of left upper limb, including shoulder: Secondary | ICD-10-CM | POA: Diagnosis not present

## 2022-10-09 DIAGNOSIS — L821 Other seborrheic keratosis: Secondary | ICD-10-CM | POA: Diagnosis not present

## 2022-10-09 DIAGNOSIS — D1801 Hemangioma of skin and subcutaneous tissue: Secondary | ICD-10-CM | POA: Diagnosis not present

## 2022-10-09 DIAGNOSIS — Z1231 Encounter for screening mammogram for malignant neoplasm of breast: Secondary | ICD-10-CM

## 2022-10-09 DIAGNOSIS — L814 Other melanin hyperpigmentation: Secondary | ICD-10-CM | POA: Diagnosis not present

## 2022-10-09 DIAGNOSIS — Z85828 Personal history of other malignant neoplasm of skin: Secondary | ICD-10-CM | POA: Diagnosis not present

## 2022-10-09 DIAGNOSIS — L738 Other specified follicular disorders: Secondary | ICD-10-CM | POA: Diagnosis not present

## 2022-10-09 DIAGNOSIS — D692 Other nonthrombocytopenic purpura: Secondary | ICD-10-CM | POA: Diagnosis not present

## 2022-10-09 DIAGNOSIS — L718 Other rosacea: Secondary | ICD-10-CM | POA: Diagnosis not present

## 2022-10-10 DIAGNOSIS — M5125 Other intervertebral disc displacement, thoracolumbar region: Secondary | ICD-10-CM | POA: Diagnosis not present

## 2022-10-10 DIAGNOSIS — R262 Difficulty in walking, not elsewhere classified: Secondary | ICD-10-CM | POA: Diagnosis not present

## 2022-10-10 DIAGNOSIS — M25551 Pain in right hip: Secondary | ICD-10-CM | POA: Diagnosis not present

## 2022-10-12 DIAGNOSIS — M5125 Other intervertebral disc displacement, thoracolumbar region: Secondary | ICD-10-CM | POA: Diagnosis not present

## 2022-10-12 DIAGNOSIS — R262 Difficulty in walking, not elsewhere classified: Secondary | ICD-10-CM | POA: Diagnosis not present

## 2022-10-12 DIAGNOSIS — M25551 Pain in right hip: Secondary | ICD-10-CM | POA: Diagnosis not present

## 2022-10-15 DIAGNOSIS — M5125 Other intervertebral disc displacement, thoracolumbar region: Secondary | ICD-10-CM | POA: Diagnosis not present

## 2022-10-15 DIAGNOSIS — R262 Difficulty in walking, not elsewhere classified: Secondary | ICD-10-CM | POA: Diagnosis not present

## 2022-10-15 DIAGNOSIS — M25551 Pain in right hip: Secondary | ICD-10-CM | POA: Diagnosis not present

## 2022-10-22 DIAGNOSIS — R262 Difficulty in walking, not elsewhere classified: Secondary | ICD-10-CM | POA: Diagnosis not present

## 2022-10-22 DIAGNOSIS — M25551 Pain in right hip: Secondary | ICD-10-CM | POA: Diagnosis not present

## 2022-10-22 DIAGNOSIS — M5125 Other intervertebral disc displacement, thoracolumbar region: Secondary | ICD-10-CM | POA: Diagnosis not present

## 2022-10-24 DIAGNOSIS — M5125 Other intervertebral disc displacement, thoracolumbar region: Secondary | ICD-10-CM | POA: Diagnosis not present

## 2022-10-24 DIAGNOSIS — R262 Difficulty in walking, not elsewhere classified: Secondary | ICD-10-CM | POA: Diagnosis not present

## 2022-10-24 DIAGNOSIS — M25551 Pain in right hip: Secondary | ICD-10-CM | POA: Diagnosis not present

## 2022-10-26 DIAGNOSIS — M25551 Pain in right hip: Secondary | ICD-10-CM | POA: Diagnosis not present

## 2022-10-26 DIAGNOSIS — R262 Difficulty in walking, not elsewhere classified: Secondary | ICD-10-CM | POA: Diagnosis not present

## 2022-10-26 DIAGNOSIS — M5125 Other intervertebral disc displacement, thoracolumbar region: Secondary | ICD-10-CM | POA: Diagnosis not present

## 2022-10-29 DIAGNOSIS — M5416 Radiculopathy, lumbar region: Secondary | ICD-10-CM | POA: Diagnosis not present

## 2022-10-29 DIAGNOSIS — M5116 Intervertebral disc disorders with radiculopathy, lumbar region: Secondary | ICD-10-CM | POA: Diagnosis not present

## 2022-10-31 DIAGNOSIS — M25551 Pain in right hip: Secondary | ICD-10-CM | POA: Diagnosis not present

## 2022-10-31 DIAGNOSIS — M5125 Other intervertebral disc displacement, thoracolumbar region: Secondary | ICD-10-CM | POA: Diagnosis not present

## 2022-10-31 DIAGNOSIS — R262 Difficulty in walking, not elsewhere classified: Secondary | ICD-10-CM | POA: Diagnosis not present

## 2022-10-31 DIAGNOSIS — M810 Age-related osteoporosis without current pathological fracture: Secondary | ICD-10-CM | POA: Diagnosis not present

## 2022-11-02 DIAGNOSIS — M5125 Other intervertebral disc displacement, thoracolumbar region: Secondary | ICD-10-CM | POA: Diagnosis not present

## 2022-11-02 DIAGNOSIS — M25551 Pain in right hip: Secondary | ICD-10-CM | POA: Diagnosis not present

## 2022-11-02 DIAGNOSIS — R262 Difficulty in walking, not elsewhere classified: Secondary | ICD-10-CM | POA: Diagnosis not present

## 2022-11-05 DIAGNOSIS — M5125 Other intervertebral disc displacement, thoracolumbar region: Secondary | ICD-10-CM | POA: Diagnosis not present

## 2022-11-05 DIAGNOSIS — R262 Difficulty in walking, not elsewhere classified: Secondary | ICD-10-CM | POA: Diagnosis not present

## 2022-11-05 DIAGNOSIS — M25551 Pain in right hip: Secondary | ICD-10-CM | POA: Diagnosis not present

## 2022-11-06 ENCOUNTER — Ambulatory Visit (INDEPENDENT_AMBULATORY_CARE_PROVIDER_SITE_OTHER): Payer: HMO | Admitting: Internal Medicine

## 2022-11-06 ENCOUNTER — Encounter: Payer: Self-pay | Admitting: Internal Medicine

## 2022-11-06 VITALS — BP 128/76 | HR 72 | Ht 64.0 in | Wt 146.8 lb

## 2022-11-06 DIAGNOSIS — K5909 Other constipation: Secondary | ICD-10-CM

## 2022-11-06 DIAGNOSIS — K52831 Collagenous colitis: Secondary | ICD-10-CM | POA: Diagnosis not present

## 2022-11-06 NOTE — Patient Instructions (Addendum)
Please Mychart message me in a few days with a update.   Take your budesonide to the [place] with you in case you need it, if needed take 9 mg.  Start taking 2 doses of Miralax in the evening to get your bowels moving. Then start your Citrucel.  _______________________________________________________  If your blood pressure at your visit was 140/90 or greater, please contact your primary care physician to follow up on this.  _______________________________________________________  If you are age 84 or older, your body mass index should be between 23-30. Your Body mass index is 25.2 kg/m. If this is out of the aforementioned range listed, please consider follow up with your Primary Care Provider.  If you are age 48 or younger, your body mass index should be between 19-25. Your Body mass index is 25.2 kg/m. If this is out of the aformentioned range listed, please consider follow up with your Primary Care Provider.   ________________________________________________________  [facility] providers would like to encourage you to use [facility] to communicate with providers for non-urgent requests or questions.  Due to long hold times on the telephone, sending your provider a message by [facility] may be a faster and more efficient way to get a response.  Please allow 48 business hours for a response.  Please remember that this is for non-urgent requests.  _______________________________________________________ It was a pleasure to see you today!  Thank you for trusting me with your gastrointestinal care!

## 2022-11-06 NOTE — Progress Notes (Signed)
[name] 82 y.o. 1941-02-24 [id number]  Assessment & Plan:   Encounter Diagnoses  Name Primary?   Collagenous colitis Yes   Other constipation     Will discontinue budesonide as she is constipated so her collagenous colitis should be under control.  She is to obtain the Citrucel powder which has worked better for her in the past, I looked it up and it is available on [employer].  She was having trouble finding it locally.  MiraLAX 2 doses each evening until bowels get moving better and then adjust.  She is to MyChart message me over the next several days so we can sort this out.  Take budesonide to the [place] and restart at 9 mg if she gets significant diarrhea again.  Stay on [place] when she travels  CC: Deland Pretty, MD  Subjective:   Chief Complaint: Constipation  HPI 82 year old white woman with collagenous colitis diagnosis made September 2023, treated with budesonide successfully, relapse of symptoms in November and then restarted and seen in January.  At that time she was better again but had relapse issues and restarted.   she has been unable to stop meloxicam due to arthritis and back pain issues.  She presents today now on 6 mg of budesonide trying to taper some and she has been constipated for a week or 2.  4 or 5 days without "good bowel movement".  Had a little bowel movement this morning.  No significant rectal pressure but some bloating.  She has tried eating a lot of prunes and using sugar-free Citrucel though she could not get the Citrucel powder and has been using capsules  She is due to go to the [place] to see the [place] on April 15.  Daughter going with her.  No Known Allergies   Current Meds  Medication Sig   amLODipine (NORVASC) 5 MG tablet Take 1 tablet (5 mg total) by mouth daily.   atorvastatin (LIPITOR) 20 MG tablet Take 1 tablet (20 mg total) by mouth daily.   Biotin 5000 MCG CAPS Take 1 capsule by mouth daily.    budesonide  (ENTOCORT EC) 3 MG 24 hr capsule Take 3 capsules (9 mg total) by mouth daily.   Calcium Carbonate (CALTRATE 600 PO) Take 1 tablet by mouth 2 (two) times a day.    celecoxib (CELEBREX) 100 MG capsule Take 100 mg by mouth daily as needed.   Cholecalciferol 25 MCG (1000 UT) capsule Take 1,000 Units by mouth daily.   denosumab (PROLIA) 60 MG/ML SOSY injection Inject 60 mg into the skin every 6 (six) months.   diclofenac sodium (VOLTAREN) 1 % GEL 1 application.   enalapril (VASOTEC) 20 MG tablet Take 1 tablet (20 mg total) by mouth 2 (two) times daily.   EVENING PRIMROSE OIL PO Take 1 capsule by mouth 2 (two) times a day.    gabapentin (NEURONTIN) 300 MG capsule Take 3 capsules (900 mg total) by mouth 3 (three) times daily.   hydrALAZINE (APRESOLINE) 50 MG tablet Take 1 tablet (50 mg total) by mouth 2 (two) times daily.   hydrochlorothiazide (HYDRODIURIL) 25 MG tablet Take 1 tablet (25 mg total) by mouth daily.   levothyroxine (SYNTHROID) 75 MCG tablet Take 1 tablet (75 mcg total) by mouth daily.   methylcellulose packet Take 1 each by mouth daily. CITRICEL   metroNIDAZOLE (METROCREAM) 0.75 % cream Apply 1 application topically 2 (two) times daily.   NONFORMULARY OR COMPOUNDED ITEM Compound cream: Meloxicam 0.5% Doxepin 3% Amantadine  3% Dextromethorphan 2% Lidocaine 2%  Sig: Apply 1-2 grams to the affected area 3-4 times daily. Dispense 240 grams with 5 refills.   pantoprazole (PROTONIX) 40 MG tablet Take 1 tablet by mouth daily as needed.   tretinoin (RETIN-A) 0.05 % cream Apply 1 application topically at bedtime.   zolpidem (AMBIEN) 5 MG tablet Take 5 mg by mouth at bedtime as needed for sleep.   Past Medical History:  Diagnosis Date   Abnormal EKG    Atrial fibrillation    Basal cell carcinoma of nose    removed w/MOHs   CAD (coronary artery disease)    a. 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 EF 30-35% at [facility] 06/2013;  b.  f/u Echo (06/18/13):  EF 60-65%, normal wall motion, Gr 1 DD, mild MVP of post leaflet, mod TR, PASP 63   Thyroid disease    Tricuspid regurgitation    mild to moderate, echo, January, 2011, PA pressure 38 mm mercury   Past Surgical History:  Procedure Laterality Date   CARDIAC CATHETERIZATION  06/17/2013   COLONOSCOPY  12/23/2003   normal (indications: prior adenomas and grandfather with colon cancer)   COLPOSCOPY     EXCISIONAL HEMORRHOIDECTOMY  2003   LEFT HEART CATHETERIZATION WITH CORONARY ANGIOGRAM N/A 06/17/2013   Procedure: LEFT HEART CATHETERIZATION WITH CORONARY ANGIOGRAM;  Surgeon: Peter M Martinique, MD;  Location: [facility] CATH LAB;  Service: Cardiovascular;  Laterality: N/A;   MOHS SURGERY Left 2011   "side of my nose" (06/17/2013)   TONSILLECTOMY     TOTAL KNEE ARTHROPLASTY   08/2010   TOTAL KNEE ARTHROPLASTY  02/25/2012   Procedure: TOTAL KNEE ARTHROPLASTY;  Surgeon: Gearlean Alf, MD;  Location: [facility];  Service: Orthopedics;  Laterality: Right;   Social History   Social History Narrative   3 caffeine drinks daily    Widowed   Retired      Lives alone   Right handed   Caffeine: 1.5 cup/day   family history includes Breast cancer in her paternal aunt; Breast cancer (age  of onset: 88) in her sister; Colon cancer in her maternal grandfather; Coronary artery disease in her father; Esophageal cancer in her paternal grandmother; Heart attack (age of onset: 13) in her mother; Heart attack (age of onset: 54) in her father; Heart disease in her father, maternal grandfather, mother, paternal grandfather, and sister; Hypertension in her father, mother, and sister; Irritable bowel syndrome in her sister; Stroke in her mother.   Review of Systems As per HPI  Objective:   Physical Exam BP 128/76   Pulse 72   Ht 5\' 4"  (1.626 m)   Wt 146 lb 12.8 oz (66.6 kg)   BMI 25.20 kg/m  NAD Abd soft NT benign, BS +  Luella Cook CMA present  Rectal - sl swollen external hemorrhoids, nontender DRE, no mass, formed brown stool no impaction

## 2022-11-07 DIAGNOSIS — R262 Difficulty in walking, not elsewhere classified: Secondary | ICD-10-CM | POA: Diagnosis not present

## 2022-11-07 DIAGNOSIS — M25551 Pain in right hip: Secondary | ICD-10-CM | POA: Diagnosis not present

## 2022-11-07 DIAGNOSIS — M5125 Other intervertebral disc displacement, thoracolumbar region: Secondary | ICD-10-CM | POA: Diagnosis not present

## 2022-11-10 ENCOUNTER — Encounter: Payer: Self-pay | Admitting: Internal Medicine

## 2022-11-12 ENCOUNTER — Encounter: Payer: Self-pay | Admitting: Neurology

## 2022-11-12 DIAGNOSIS — M25551 Pain in right hip: Secondary | ICD-10-CM | POA: Diagnosis not present

## 2022-11-12 DIAGNOSIS — R262 Difficulty in walking, not elsewhere classified: Secondary | ICD-10-CM | POA: Diagnosis not present

## 2022-11-12 DIAGNOSIS — M5125 Other intervertebral disc displacement, thoracolumbar region: Secondary | ICD-10-CM | POA: Diagnosis not present

## 2022-11-12 MED ORDER — NONFORMULARY OR COMPOUNDED ITEM: 1.0000 g | Freq: Four times a day (QID) | 5 refills | Status: DC

## 2022-11-12 NOTE — Telephone Encounter (Signed)
Called [facility] at [phone]. Spoke w/ Toniann Fail. Provided VO for refill below. They will get this ready for pt.

## 2022-11-15 DIAGNOSIS — M5125 Other intervertebral disc displacement, thoracolumbar region: Secondary | ICD-10-CM | POA: Diagnosis not present

## 2022-11-15 DIAGNOSIS — R262 Difficulty in walking, not elsewhere classified: Secondary | ICD-10-CM | POA: Diagnosis not present

## 2022-11-15 DIAGNOSIS — M25551 Pain in right hip: Secondary | ICD-10-CM | POA: Diagnosis not present

## 2022-11-28 ENCOUNTER — Ambulatory Visit: Payer: PPO | Admitting: Neurology

## 2022-12-03 DIAGNOSIS — M5416 Radiculopathy, lumbar region: Secondary | ICD-10-CM | POA: Diagnosis not present

## 2022-12-04 ENCOUNTER — Ambulatory Visit (INDEPENDENT_AMBULATORY_CARE_PROVIDER_SITE_OTHER): Payer: HMO | Admitting: Neurology

## 2022-12-04 ENCOUNTER — Ambulatory Visit: Payer: PPO

## 2022-12-04 DIAGNOSIS — G6289 Other specified polyneuropathies: Secondary | ICD-10-CM

## 2022-12-04 MED ORDER — ONABOTULINUMTOXINA 100 UNITS IJ SOLR
200.0000 [IU] | Freq: Once | INTRAMUSCULAR | Status: AC
  Administered 2022-12-04: 200 [IU] via INTRAMUSCULAR

## 2022-12-04 NOTE — Patient Instructions (Signed)
USE 300 U and spray with numbing cream!! Can put 300 units into 2 syringes   12/04/2022: Getting > 50% improvement in pain and neuropathy.  1.23.2024: working significantly well, >>50% improvement in pain. Unfortunately she leaves for [place] on [place] 15th, not sure we can rebotox before then (12 weeks have to pass)  10.26/2023: Significanty helping. Concentrate into 2 syringes. >> 60% improvement in pain  03/05/2022: Significanty helping. Concentrate into 2 syringes. >> 60% improvement in pain  In the balls of the feet and instep of the right foot (flexor digiotorum brevis, all lumbricals)  11/07/2021: After 3 months the severe pain returned, botox is making a significant difference in pain > 60% improvement in pain. It was best to keep her feet on the chairs and dorsiflex them while I inject bc it hurts a lot. Left foot anterior pad of lower foot and right underneath the toes, on the right the anterior pad of lower foot and part of the instep medially. Keep feet on chair and hold foot in dorsiflexion position tightly while injecting helped with the injection pain.  07/16/2021: unclear how much improvement. We will stop botox for now and see if she reports increased pain when it wears off 04/11/2021: Botox works great > 60% improvement in pain and quality of life, she can feel it wearing off. 01/04/2021: > 50% improvement in pain, continue current dose 10/04/2020: first injections  History: Refractory idiopathic peripheral diabetic polyneuropathy. 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 Hx of diabetic(per-diabetic) neuropathy.   Procedure: All procedures documented were medically necessary, reasonable and appropriate based on the patient's history, medical diagnosis and physician opinion. Verbal informed consent was obtained  from the patient, patient was informed of potential risk of procedure, including bruising, bleeding, hematoma formation, infection, muscle weakness, muscle pain, numbness, transient hypertension, transient hyperglycemia and transient insomnia among others. All areas injected were topically clean with isopropyl rubbing alcohol. Nonsterile nonlatex gloves were worn during the procedure.   Injections were given bilaterally to the plantar surface of the feet.  The feet were properly sterilized with evenly spaced sites bilaterally injected each with 10 units of botox using a 1 ml 30-gauge needle.  200 units of Botox were used and none was wasted.  Dx: G62.89 B/B 16109 CPT code and 60454 for additional limb

## 2022-12-04 NOTE — Telephone Encounter (Signed)
From Dr Lucia Gaskins:   Alexis Fret, MD  P [facility] Calls  12/04/2022: Getting > 50% improvement in pain and neuropathy and quality of life and walking On 200 units. Can we get 300 units approved for better pain management please approved?

## 2022-12-04 NOTE — Progress Notes (Signed)
Botox- 100 units x 2 vials Lot: [id number] Expiration: 12/2024 NDC: 4540-9811-91  Bacteriostatic 0.9% Sodium Chloride 1.2 mL  Lot: [id number] Expiration: 06/2024 NDC: 21308-657-84  Dx: G62.89 B/B Witnessed by Delmer Islam

## 2022-12-04 NOTE — Progress Notes (Signed)
USE 200 U and spray with numbing cream!! Can put 200 units into 2 syringes  1.23.2024: working significantly well, >>50% improvement in pain. Unfortunately she leaves for [place] on April 15th, not sure we can rebotox before then (12 weeks have to pass)  10.26/2023: Significanty helping. Concentrate into 2 syringes. >> 60% improvement in pain  03/05/2022: Significanty helping. Concentrate into 2 syringes. >> 60% improvement in pain  In the balls of the feet and instep of the right foot (flexor digiotorum brevis, all lumbricals)  11/07/2021: After 3 months the severe pain returned, botox is making a significant difference in pain > 60% improvement in pain. It was best to keep her feet on the chairs and dorsiflex them while I inject bc it hurts a lot. Left foot anterior pad of lower foot and right underneath the toes, on the right the anterior pad of lower foot and part of the instep medially. Keep feet on chair and hold foot in dorsiflexion position tightly while injecting helped with the injection pain.  07/16/2021: unclear how much improvement. We will stop botox for now and see if she reports increased pain when it wears off 04/11/2021: Botox works great > 60% improvement in pain and quality of life, she can feel it wearing off. 01/04/2021: > 50% improvement in pain, continue current dose 10/04/2020: first injections  History: Refractory idiopathic peripheral diabetic polyneuropathy. 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 Hx of diabetic(per-diabetic) neuropathy.   Procedure: All procedures documented were medically necessary, reasonable and appropriate based on the patient's history, medical diagnosis and physician opinion. Verbal informed consent was obtained from the patient, patient was informed of potential risk of  procedure, including bruising, bleeding, hematoma formation, infection, muscle weakness, muscle pain, numbness, transient hypertension, transient hyperglycemia and transient insomnia among others. All areas injected were topically clean with isopropyl rubbing alcohol. Nonsterile nonlatex gloves were worn during the procedure.   Injections were given bilaterally to the plantar surface of the feet.  The feet were properly sterilized with evenly spaced sites bilaterally injected each with 10 units of botox using a 1 ml 30-gauge needle.  200 units of Botox were used and none was wasted.  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 

## 2022-12-10 ENCOUNTER — Ambulatory Visit: Payer: PPO | Admitting: Neurology

## 2022-12-18 ENCOUNTER — Ambulatory Visit
Admission: RE | Admit: 2022-12-18 | Discharge: 2022-12-18 | Disposition: A | Discharge status: Home / Self Care | Payer: HMO | Source: Ambulatory Visit | Attending: Obstetrics & Gynecology | Admitting: Obstetrics & Gynecology

## 2022-12-18 DIAGNOSIS — Z1231 Encounter for screening mammogram for malignant neoplasm of breast: Secondary | ICD-10-CM | POA: Diagnosis not present

## 2022-12-19 DIAGNOSIS — R5383 Other fatigue: Secondary | ICD-10-CM | POA: Diagnosis not present

## 2022-12-20 ENCOUNTER — Telehealth: Payer: Self-pay

## 2022-12-20 NOTE — Telephone Encounter (Signed)
Noted, I updated the next appointment note. Dr Lucia Gaskins, Lorain Childes we can do 300 units of Botox at her next appointment in July.

## 2022-12-20 NOTE — Telephone Encounter (Signed)
I outreached [facility] to verify if precertification is required for Jcode Z6109 and no PA is required,    Phone Call Reference# Cristal Deer T. 12/20/2022 11:44Am EST

## 2022-12-20 NOTE — Telephone Encounter (Signed)
A new telephone call encounter has been made for this PA Request-please see telephone note dated .Marland KitchenMarland Kitchen5/16/2024.

## 2022-12-25 DIAGNOSIS — F5104 Psychophysiologic insomnia: Secondary | ICD-10-CM | POA: Diagnosis not present

## 2022-12-25 DIAGNOSIS — I10 Essential (primary) hypertension: Secondary | ICD-10-CM | POA: Diagnosis not present

## 2022-12-25 DIAGNOSIS — R5383 Other fatigue: Secondary | ICD-10-CM | POA: Diagnosis not present

## 2022-12-25 DIAGNOSIS — R262 Difficulty in walking, not elsewhere classified: Secondary | ICD-10-CM | POA: Diagnosis not present

## 2022-12-25 DIAGNOSIS — M25551 Pain in right hip: Secondary | ICD-10-CM | POA: Diagnosis not present

## 2022-12-25 DIAGNOSIS — M5125 Other intervertebral disc displacement, thoracolumbar region: Secondary | ICD-10-CM | POA: Diagnosis not present

## 2022-12-25 DIAGNOSIS — E871 Hypo-osmolality and hyponatremia: Secondary | ICD-10-CM | POA: Diagnosis not present

## 2023-01-01 DIAGNOSIS — M5125 Other intervertebral disc displacement, thoracolumbar region: Secondary | ICD-10-CM | POA: Diagnosis not present

## 2023-01-01 DIAGNOSIS — M25551 Pain in right hip: Secondary | ICD-10-CM | POA: Diagnosis not present

## 2023-01-01 DIAGNOSIS — R262 Difficulty in walking, not elsewhere classified: Secondary | ICD-10-CM | POA: Diagnosis not present

## 2023-01-02 DIAGNOSIS — M25551 Pain in right hip: Secondary | ICD-10-CM | POA: Diagnosis not present

## 2023-01-02 DIAGNOSIS — M5125 Other intervertebral disc displacement, thoracolumbar region: Secondary | ICD-10-CM | POA: Diagnosis not present

## 2023-01-02 DIAGNOSIS — R262 Difficulty in walking, not elsewhere classified: Secondary | ICD-10-CM | POA: Diagnosis not present

## 2023-01-07 DIAGNOSIS — M5125 Other intervertebral disc displacement, thoracolumbar region: Secondary | ICD-10-CM | POA: Diagnosis not present

## 2023-01-07 DIAGNOSIS — M25551 Pain in right hip: Secondary | ICD-10-CM | POA: Diagnosis not present

## 2023-01-07 DIAGNOSIS — R262 Difficulty in walking, not elsewhere classified: Secondary | ICD-10-CM | POA: Diagnosis not present

## 2023-01-08 DIAGNOSIS — M25551 Pain in right hip: Secondary | ICD-10-CM | POA: Diagnosis not present

## 2023-01-08 DIAGNOSIS — R262 Difficulty in walking, not elsewhere classified: Secondary | ICD-10-CM | POA: Diagnosis not present

## 2023-01-08 DIAGNOSIS — M5125 Other intervertebral disc displacement, thoracolumbar region: Secondary | ICD-10-CM | POA: Diagnosis not present

## 2023-01-14 DIAGNOSIS — M5125 Other intervertebral disc displacement, thoracolumbar region: Secondary | ICD-10-CM | POA: Diagnosis not present

## 2023-01-14 DIAGNOSIS — M25551 Pain in right hip: Secondary | ICD-10-CM | POA: Diagnosis not present

## 2023-01-14 DIAGNOSIS — R262 Difficulty in walking, not elsewhere classified: Secondary | ICD-10-CM | POA: Diagnosis not present

## 2023-01-21 DIAGNOSIS — M25551 Pain in right hip: Secondary | ICD-10-CM | POA: Diagnosis not present

## 2023-01-21 DIAGNOSIS — R262 Difficulty in walking, not elsewhere classified: Secondary | ICD-10-CM | POA: Diagnosis not present

## 2023-01-21 DIAGNOSIS — M5125 Other intervertebral disc displacement, thoracolumbar region: Secondary | ICD-10-CM | POA: Diagnosis not present

## 2023-01-22 DIAGNOSIS — M25551 Pain in right hip: Secondary | ICD-10-CM | POA: Diagnosis not present

## 2023-01-22 DIAGNOSIS — R262 Difficulty in walking, not elsewhere classified: Secondary | ICD-10-CM | POA: Diagnosis not present

## 2023-01-22 DIAGNOSIS — M5125 Other intervertebral disc displacement, thoracolumbar region: Secondary | ICD-10-CM | POA: Diagnosis not present

## 2023-01-23 DIAGNOSIS — M25551 Pain in right hip: Secondary | ICD-10-CM | POA: Diagnosis not present

## 2023-01-23 DIAGNOSIS — R262 Difficulty in walking, not elsewhere classified: Secondary | ICD-10-CM | POA: Diagnosis not present

## 2023-01-23 DIAGNOSIS — M5125 Other intervertebral disc displacement, thoracolumbar region: Secondary | ICD-10-CM | POA: Diagnosis not present

## 2023-01-24 DIAGNOSIS — R262 Difficulty in walking, not elsewhere classified: Secondary | ICD-10-CM | POA: Diagnosis not present

## 2023-01-24 DIAGNOSIS — M5125 Other intervertebral disc displacement, thoracolumbar region: Secondary | ICD-10-CM | POA: Diagnosis not present

## 2023-01-24 DIAGNOSIS — M25551 Pain in right hip: Secondary | ICD-10-CM | POA: Diagnosis not present

## 2023-01-29 DIAGNOSIS — M5416 Radiculopathy, lumbar region: Secondary | ICD-10-CM | POA: Diagnosis not present

## 2023-01-29 DIAGNOSIS — M5116 Intervertebral disc disorders with radiculopathy, lumbar region: Secondary | ICD-10-CM | POA: Diagnosis not present

## 2023-01-31 DIAGNOSIS — Z85828 Personal history of other malignant neoplasm of skin: Secondary | ICD-10-CM | POA: Diagnosis not present

## 2023-01-31 DIAGNOSIS — H00014 Hordeolum externum left upper eyelid: Secondary | ICD-10-CM | POA: Diagnosis not present

## 2023-01-31 DIAGNOSIS — L720 Epidermal cyst: Secondary | ICD-10-CM | POA: Diagnosis not present

## 2023-02-12 DIAGNOSIS — M25552 Pain in left hip: Secondary | ICD-10-CM | POA: Diagnosis not present

## 2023-02-13 ENCOUNTER — Other Ambulatory Visit: Payer: Self-pay

## 2023-02-13 DIAGNOSIS — E785 Hyperlipidemia, unspecified: Secondary | ICD-10-CM

## 2023-02-13 DIAGNOSIS — M25551 Pain in right hip: Secondary | ICD-10-CM | POA: Diagnosis not present

## 2023-02-13 DIAGNOSIS — I480 Paroxysmal atrial fibrillation: Secondary | ICD-10-CM

## 2023-02-13 DIAGNOSIS — I5181 Takotsubo syndrome: Secondary | ICD-10-CM

## 2023-02-13 DIAGNOSIS — R262 Difficulty in walking, not elsewhere classified: Secondary | ICD-10-CM | POA: Diagnosis not present

## 2023-02-13 DIAGNOSIS — M5125 Other intervertebral disc displacement, thoracolumbar region: Secondary | ICD-10-CM | POA: Diagnosis not present

## 2023-02-13 DIAGNOSIS — I10 Essential (primary) hypertension: Secondary | ICD-10-CM

## 2023-02-13 MED ORDER — AMLODIPINE BESYLATE 5 MG PO TABS: 5.0000 mg | ORAL_TABLET | Freq: Every day | ORAL | 0 refills | Status: DC

## 2023-02-13 MED ORDER — HYDROCHLOROTHIAZIDE 25 MG PO TABS: 25.0000 mg | ORAL_TABLET | Freq: Every day | ORAL | 0 refills | Status: DC

## 2023-02-13 MED ORDER — HYDRALAZINE HCL 50 MG PO TABS: 50.0000 mg | ORAL_TABLET | Freq: Two times a day (BID) | ORAL | 0 refills | Status: DC

## 2023-02-13 MED ORDER — ENALAPRIL MALEATE 20 MG PO TABS: 20.0000 mg | ORAL_TABLET | Freq: Two times a day (BID) | ORAL | 0 refills | Status: DC

## 2023-02-13 NOTE — Addendum Note (Signed)
Addended by: Margaret Pyle D on: 02/13/2023 01:47 PM   Modules accepted: Orders

## 2023-02-13 NOTE — Addendum Note (Signed)
Addended by: Margaret Pyle D on: 02/13/2023 01:45 PM   Modules accepted: Orders

## 2023-02-15 DIAGNOSIS — M25552 Pain in left hip: Secondary | ICD-10-CM | POA: Diagnosis not present

## 2023-02-20 DIAGNOSIS — M25551 Pain in right hip: Secondary | ICD-10-CM | POA: Diagnosis not present

## 2023-02-20 DIAGNOSIS — M5125 Other intervertebral disc displacement, thoracolumbar region: Secondary | ICD-10-CM | POA: Diagnosis not present

## 2023-02-20 DIAGNOSIS — R262 Difficulty in walking, not elsewhere classified: Secondary | ICD-10-CM | POA: Diagnosis not present

## 2023-02-26 ENCOUNTER — Telehealth: Payer: Self-pay | Admitting: Neurology

## 2023-02-26 ENCOUNTER — Ambulatory Visit (INDEPENDENT_AMBULATORY_CARE_PROVIDER_SITE_OTHER): Payer: HMO | Admitting: Neurology

## 2023-02-26 DIAGNOSIS — G6289 Other specified polyneuropathies: Secondary | ICD-10-CM | POA: Diagnosis not present

## 2023-02-26 MED ORDER — ONABOTULINUMTOXINA 100 UNITS IJ SOLR
100.0000 [IU] | Freq: Once | INTRAMUSCULAR | Status: AC
Start: 2023-02-26 — End: ?

## 2023-02-26 MED ORDER — ONABOTULINUMTOXINA 100 UNITS IJ SOLR
300.0000 [IU] | Freq: Once | INTRAMUSCULAR | Status: AC
Start: 2023-02-26 — End: 2023-02-26
  Administered 2023-02-26: 300 [IU] via INTRAMUSCULAR

## 2023-02-26 NOTE — Addendum Note (Signed)
Addended by: Raynald Kemp A on: 02/26/2023 05:01 PM   Modules accepted: Orders

## 2023-02-26 NOTE — Telephone Encounter (Signed)
Noted, her insurance doesn't require prior auth.

## 2023-02-26 NOTE — Progress Notes (Signed)
02/26/2023: improved even further will use higher dose will USE 300 U today and spray with numbing cream!! Can put 300 units into 2 syringes. Right foot is the worst on the cushion of the bottom of each foot and the right one an area in the arch. CONSIDER 400 UNITS NEXT TIME SHE WILL CALL us.   12/04/2022 used 200 nad will increase to 300U july  1.23.2024: working significantly well, >>50% improvement in pain. Unfortunately she leaves for [place] on April 15th, not sure we can rebotox before then (12 weeks have to pass)  10.26/2023: Significanty helping. Concentrate into 2 syringes. >> 60% improvement in pain  03/05/2022: Significanty helping. Concentrate into 2 syringes. >> 60% improvement in pain  In the balls of the feet and instep of the right foot (flexor digiotorum brevis, all lumbricals)  11/07/2021: After 3 months the severe pain returned, botox is making a significant difference in pain > 60% improvement in pain. It was best to keep her feet on the chairs and dorsiflex them while I inject bc it hurts a lot. Left foot anterior pad of lower foot and right underneath the toes, on the right the anterior pad of lower foot and part of the instep medially. Keep feet on chair and hold foot in dorsiflexion position tightly while injecting helped with the injection pain.  07/16/2021: unclear how much improvement. We will stop botox for now and see if she reports increased pain when it wears off 04/11/2021: Botox works great > 60% improvement in pain and quality of life, she can feel it wearing off. 01/04/2021: > 50% improvement in pain, continue current dose 10/04/2020: first injections  History: Refractory idiopathic peripheral diabetic polyneuropathy. 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 Hx of diabetic(per-diabetic) neuropathy.    Procedure: All procedures documented were medically necessary, reasonable and appropriate based on the patient's history, medical diagnosis and physician opinion. Verbal informed consent was obtained from the patient, patient was informed of potential risk of procedure, including bruising, bleeding, hematoma formation, infection, muscle weakness, muscle pain, numbness, transient hypertension, transient hyperglycemia and transient insomnia among others. All areas injected were topically clean with isopropyl rubbing alcohol. Nonsterile nonlatex gloves were worn during the procedure.   Injections were given bilaterally to the plantar surface of the feet.  The feet were properly sterilized with evenly spaced sites bilaterally injected each with 10 units of botox using a 1 ml 30-gauge needle.  300 units of Botox were used and none was wasted.  Dx: G62.89 B/B 40981 CPT code and 19147 for additional limb

## 2023-02-26 NOTE — Telephone Encounter (Signed)
We may use 400Units on patient at next appointment, used 300 today. She will call us to let us know maybe we should get approved just in case? Does it need approval?   Dx: G62.89 B/B 27062 CPT code and 37628 for additional limb

## 2023-02-26 NOTE — Progress Notes (Signed)
Botox- 100 units x 3 vials Lot: [id number] Expiration: 12/2024 NDC: 8119-1478-29  Bacteriostatic 0.9% Sodium Chloride- 2 mL  Lot: [id number] Expiration: 06/06/2024 NDC: 8657-8469-62  Dx: G62.89  B/B Witnessed by Maryjean Ka

## 2023-02-27 DIAGNOSIS — M25552 Pain in left hip: Secondary | ICD-10-CM | POA: Diagnosis not present

## 2023-02-27 DIAGNOSIS — M47816 Spondylosis without myelopathy or radiculopathy, lumbar region: Secondary | ICD-10-CM | POA: Diagnosis not present

## 2023-03-05 ENCOUNTER — Other Ambulatory Visit: Payer: Self-pay | Admitting: Neurology

## 2023-03-07 ENCOUNTER — Other Ambulatory Visit: Payer: Self-pay

## 2023-03-07 MED ORDER — ATORVASTATIN CALCIUM 20 MG PO TABS: 20.0000 mg | ORAL_TABLET | Freq: Every day | ORAL | 0 refills | Status: DC

## 2023-03-13 DIAGNOSIS — M7062 Trochanteric bursitis, left hip: Secondary | ICD-10-CM | POA: Diagnosis not present

## 2023-03-13 DIAGNOSIS — M5416 Radiculopathy, lumbar region: Secondary | ICD-10-CM | POA: Diagnosis not present

## 2023-03-13 DIAGNOSIS — M4120 Other idiopathic scoliosis, site unspecified: Secondary | ICD-10-CM | POA: Diagnosis not present

## 2023-03-18 ENCOUNTER — Telehealth: Payer: Self-pay | Admitting: Cardiovascular Disease

## 2023-03-18 NOTE — Telephone Encounter (Signed)
Pt c/o BP issue: STAT if pt c/o blurred vision, one-sided weakness or slurred speech  1. What are your last 5 BP readings? 160's-100's   2. Are you having any other symptoms (ex. Dizziness, headache, blurred vision, passed out)? Headache   3. What is your BP issue? Pt states her bp has been high and not sure what to do in the meantime. Scheduled to see Dan Humphreys,  NP 04/10/23, previous Shari Prows pt will transfer care to [facility].

## 2023-03-18 NOTE — Telephone Encounter (Signed)
May instead take an additional Hydralazine 25mg  (half tablet) as needed for SBP >160.  Recommend she check BP once per day for a week and keep a log  Alver Sorrow, NP

## 2023-03-18 NOTE — Telephone Encounter (Signed)
Difficult to assess as only one BP reading and not seen in one year.  She may go ahead and take an additional Amlodipine 5mg  if her BP remains elevated >140/80 after sitting and resting for 30 minutes.  If BP tomorrow is >140/80, continue to take Amlodipine 10mg  daily. If <140/80, just take Amlodipine 5mg . Follow up as scheduled  She needs to follow up with PCP or orthopedist regarding pain.   Alver Sorrow, NP

## 2023-03-18 NOTE — Telephone Encounter (Signed)
Pt. Is currently seeing neurologist regarding her pain. She states she "reacts" to amlodipine 10mg .  She only tolerates 5 mg.  Her reaction is typically ankle edema.  Pt aware Luther Parody has left for the day, but will review concerns tomorrow. Jim Like MHA RN CCM

## 2023-03-18 NOTE — Telephone Encounter (Signed)
03/18/2023 Returned call to patient who reports recent Bps 160 to 100's.  Patient states recently she's having excruciating pain in her back and hips and she is aware this is probably contributing to her elevated blood pressure. Pt states she typically exercises and hasn't been able to do that with her pain. Explained to her the correct process for checking Bp, however pt. States she knows her Bp will still be high even if she tries the recommendations for the correct BP process.  Pt checked her Bp right before speaking with Clinical research associate and it was 166/114. Will forward to Gillian Shields NP. Jim Like MHA RN CCM

## 2023-03-19 NOTE — Telephone Encounter (Signed)
Returned call to patient, Reviewed recommendations. Patient will try the below recommendations and bring updated BP log to her appointment.     "May instead take an additional Hydralazine 25mg  (half tablet) as needed for SBP >160.   Recommend she check BP once per day for a week and keep a log   Alver Sorrow, NP"

## 2023-03-20 DIAGNOSIS — M6281 Muscle weakness (generalized): Secondary | ICD-10-CM | POA: Diagnosis not present

## 2023-03-20 DIAGNOSIS — M7062 Trochanteric bursitis, left hip: Secondary | ICD-10-CM | POA: Diagnosis not present

## 2023-03-20 DIAGNOSIS — R262 Difficulty in walking, not elsewhere classified: Secondary | ICD-10-CM | POA: Diagnosis not present

## 2023-03-26 DIAGNOSIS — M7062 Trochanteric bursitis, left hip: Secondary | ICD-10-CM | POA: Diagnosis not present

## 2023-03-26 DIAGNOSIS — M65311 Trigger thumb, right thumb: Secondary | ICD-10-CM | POA: Diagnosis not present

## 2023-03-26 DIAGNOSIS — M1991 Primary osteoarthritis, unspecified site: Secondary | ICD-10-CM | POA: Diagnosis not present

## 2023-03-26 DIAGNOSIS — Z6824 Body mass index (BMI) 24.0-24.9, adult: Secondary | ICD-10-CM | POA: Diagnosis not present

## 2023-03-26 DIAGNOSIS — M810 Age-related osteoporosis without current pathological fracture: Secondary | ICD-10-CM | POA: Diagnosis not present

## 2023-03-26 DIAGNOSIS — M5136 Other intervertebral disc degeneration, lumbar region: Secondary | ICD-10-CM | POA: Diagnosis not present

## 2023-03-26 DIAGNOSIS — M353 Polymyalgia rheumatica: Secondary | ICD-10-CM | POA: Diagnosis not present

## 2023-04-01 DIAGNOSIS — R262 Difficulty in walking, not elsewhere classified: Secondary | ICD-10-CM | POA: Diagnosis not present

## 2023-04-01 DIAGNOSIS — M7062 Trochanteric bursitis, left hip: Secondary | ICD-10-CM | POA: Diagnosis not present

## 2023-04-01 DIAGNOSIS — M6281 Muscle weakness (generalized): Secondary | ICD-10-CM | POA: Diagnosis not present

## 2023-04-02 ENCOUNTER — Ambulatory Visit (HOSPITAL_COMMUNITY): Payer: HMO | Attending: Cardiology

## 2023-04-02 DIAGNOSIS — I341 Nonrheumatic mitral (valve) prolapse: Secondary | ICD-10-CM | POA: Insufficient documentation

## 2023-04-02 DIAGNOSIS — I340 Nonrheumatic mitral (valve) insufficiency: Secondary | ICD-10-CM | POA: Diagnosis not present

## 2023-04-02 LAB — ECHOCARDIOGRAM COMPLETE
Area-P 1/2: 3.76 cm2
S' Lateral: 2.5 cm

## 2023-04-09 DIAGNOSIS — R262 Difficulty in walking, not elsewhere classified: Secondary | ICD-10-CM | POA: Diagnosis not present

## 2023-04-09 DIAGNOSIS — M6281 Muscle weakness (generalized): Secondary | ICD-10-CM | POA: Diagnosis not present

## 2023-04-09 DIAGNOSIS — M7062 Trochanteric bursitis, left hip: Secondary | ICD-10-CM | POA: Diagnosis not present

## 2023-04-10 ENCOUNTER — Other Ambulatory Visit: Payer: Self-pay | Admitting: Neurology

## 2023-04-10 ENCOUNTER — Ambulatory Visit (INDEPENDENT_AMBULATORY_CARE_PROVIDER_SITE_OTHER): Payer: HMO | Admitting: Family

## 2023-04-10 ENCOUNTER — Encounter (HOSPITAL_BASED_OUTPATIENT_CLINIC_OR_DEPARTMENT_OTHER): Payer: Self-pay | Admitting: Family

## 2023-04-10 VITALS — BP 130/82 | HR 73 | Ht 64.0 in | Wt 141.0 lb

## 2023-04-10 DIAGNOSIS — E785 Hyperlipidemia, unspecified: Secondary | ICD-10-CM

## 2023-04-10 DIAGNOSIS — I700 Atherosclerosis of aorta: Secondary | ICD-10-CM | POA: Diagnosis not present

## 2023-04-10 DIAGNOSIS — I10 Essential (primary) hypertension: Secondary | ICD-10-CM | POA: Diagnosis not present

## 2023-04-10 DIAGNOSIS — I340 Nonrheumatic mitral (valve) insufficiency: Secondary | ICD-10-CM | POA: Diagnosis not present

## 2023-04-10 DIAGNOSIS — I480 Paroxysmal atrial fibrillation: Secondary | ICD-10-CM

## 2023-04-10 DIAGNOSIS — I5181 Takotsubo syndrome: Secondary | ICD-10-CM | POA: Diagnosis not present

## 2023-04-10 DIAGNOSIS — I341 Nonrheumatic mitral (valve) prolapse: Secondary | ICD-10-CM | POA: Diagnosis not present

## 2023-04-10 MED ORDER — HYDROCHLOROTHIAZIDE 25 MG PO TABS
25.0000 mg | ORAL_TABLET | Freq: Every day | ORAL | 3 refills | Status: DC
Start: 2023-04-10 — End: 2023-05-30

## 2023-04-10 MED ORDER — ATORVASTATIN CALCIUM 20 MG PO TABS: 20.0000 mg | ORAL_TABLET | Freq: Every day | ORAL | 3 refills | Status: DC

## 2023-04-10 MED ORDER — ENALAPRIL MALEATE 20 MG PO TABS: 20.0000 mg | ORAL_TABLET | Freq: Two times a day (BID) | ORAL | 3 refills | Status: DC

## 2023-04-10 MED ORDER — AMLODIPINE BESYLATE 5 MG PO TABS: 5.0000 mg | ORAL_TABLET | Freq: Every day | ORAL | 3 refills | Status: DC

## 2023-04-10 NOTE — Progress Notes (Unsigned)
Cardiology Office Note:  .   Date:  04/10/2023  ID:  TEAGEN NIESE, DOB 1941-02-12, MRN [id number] PCP: Merri Brunette, MD  [facility] Providers Cardiologist:  None { Click to update primary MD,subspecialty MD or APP then REFRESH:1}   History of Present Illness: .   CURTRINA SUH is a 82 y.o. 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 Echo 2014 normal LVEF, severe pulmonary hypertension (RVSP ). Repeat echo improvement in PA pressure to normal 25 mmHg, moderate MVP, mild MR. Echo 04/2020 LVEF 60-65%, mild LVH, mild MR, mild MR, normal PASP, mild TR.  TTE 2024 normal LVEF, mild to moderate MR, moderate MVP which was stable from previous.   Presents today for follow up independently. Enjoyed a [place] to the [place] in the [place]. Reports no shortness of breath nor dyspnea on exertion. Reports no chest pain, pressure, or tightness. No edema, orthopnea, PND. Reports no palpitations.    12/2022 Hb 13.2, creatinine 0.95, GFR 59, K 3.7, AST 19, ALT 17  ROS: Please see the history of present illness.    All other systems reviewed and are negative.   Studies Reviewed: Marland Kitchen   EKG Interpretation Date/Time:  Wednesday April 10 2023 15:33:15 EDT Ventricular Rate:  73 PR Interval:  196 QRS Duration:  98 QT Interval:  398 QTC Calculation: 438 R Axis:   1  Text Interpretation: Normal sinus rhythm Confirmed by Gillian Shields ([id number]) on 04/10/2023 3:39:54 PM    Cardiac Studies & Procedures       ECHOCARDIOGRAM  ECHOCARDIOGRAM COMPLETE 04/02/2023  Narrative ECHOCARDIOGRAM REPORT    Patient Name:   MITCHELLE NEWLON [facility] Date of Exam: 04/02/2023 Medical Rec #:  [id number]          Height:       64.0 in Accession #:    [id number]         Weight:       146.8 lb Date of Birth:  [birth year]         BSA:          1.715 m Patient Age:    81 years           BP:           147/85 mmHg Patient Gender: F                  HR:            69 bpm. Exam Location:  [address]  Procedure: 2D Echo, 3D Echo, Cardiac Doppler and Color Doppler  Indications:    I34.1 MVP  History:        Patient has prior history of Echocardiogram examinations, most recent 04/27/2023. Abnormal ECG, Carotid Disease, Pulmonary HTN and CKD, Arrythmias:Atrial Fibrillation and Bradycardia, Signs/Symptoms:Syncope; Risk Factors:Hypertension and Dyslipidemia.  Sonographer:    Clearence Ped RCS Referring Phys: [id number] HEATHER E [place]  IMPRESSIONS   1. Left ventricular ejection fraction, by estimation, is 60 to 65%. The left ventricle has normal function. The left ventricle has no regional wall motion abnormalities. Left ventricular diastolic parameters are indeterminate. 2. Right ventricular systolic function is normal. The right ventricular size is normal. There is normal pulmonary artery systolic pressure. 3. The mitral valve is abnormal. Mild to moderate mitral valve regurgitation. No evidence of mitral stenosis. 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 4. The aortic valve is tricuspid. Aortic  valve regurgitation is not visualized. No aortic stenosis is present. 5. The inferior vena cava is normal in size with greater than 50% respiratory variability, suggesting right atrial pressure of 3 mmHg.  Comparison(s): Prior images reviewed side by side. Moderate mitral valve prolapse better visualized on current study.  FINDINGS Left Ventricle: Left ventricular ejection fraction, by estimation, is 60 to 65%. The left ventricle has normal function. The left ventricle has no regional wall motion abnormalities. The left ventricular internal cavity size was normal in size. There is no left ventricular hypertrophy. Left ventricular diastolic parameters are indeterminate.  Right Ventricle: The right ventricular size is normal. Right vetricular wall thickness was not well visualized. Right  ventricular systolic function is normal. There is normal pulmonary artery systolic pressure. The tricuspid regurgitant velocity is 2.49 m/s, and with an assumed right atrial pressure of 3 mmHg, the estimated right ventricular systolic pressure is 27.8 mmHg.  Left Atrium: Left atrial size was normal in size.  Right Atrium: Right atrial size was normal in size.  Pericardium: Trivial pericardial effusion is present.  Mitral Valve: The mitral valve is abnormal. 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 Mild to moderate mitral valve regurgitation. No evidence of mitral valve stenosis.  Tricuspid Valve: The tricuspid valve is normal in structure. Tricuspid valve regurgitation is mild . No evidence of tricuspid stenosis.  Aortic Valve: The aortic valve is tricuspid. Aortic valve regurgitation is not visualized. No aortic stenosis is present.  Pulmonic Valve: The pulmonic valve was grossly normal. Pulmonic valve regurgitation is trivial. No evidence of pulmonic stenosis.  Aorta: The aortic root, ascending aorta, aortic arch and descending aorta are all structurally normal, with no evidence of dilitation or obstruction.  Venous: The inferior vena cava is normal in size with greater than 50% respiratory variability, suggesting right atrial pressure of 3 mmHg.  IAS/Shunts: The atrial septum is grossly normal.   LEFT VENTRICLE PLAX 2D LVIDd:         4.00 cm   Diastology LVIDs:         2.50 cm   LV e' medial:    7.51 cm/s LV PW:         1.30 cm   LV E/e' medial:  8.1 LV IVS:        1.00 cm   LV e' lateral:   4.03 cm/s LVOT diam:     1.70 cm   LV E/e' lateral: 15.1 LV SV:         54 LV SV Index:   31 LVOT Area:     2.27 cm  3D Volume EF: 3D EF:        50 % LV EDV:       83 ml LV ESV:       41 ml LV SV:        42 ml  RIGHT VENTRICLE RV Basal diam:  3.80 cm RV S prime:     11.50 cm/s TAPSE (M-mode): 2.7 cm RVSP:            27.8 mmHg  LEFT ATRIUM             Index        RIGHT ATRIUM           Index LA diam:        3.40 cm 1.98 cm/m   RA Pressure: 3.00 mmHg LA Vol (A2C):   40.9 ml 23.84 ml/m  RA Area:     11.50 cm LA Vol (A4C):   42.1 ml 24.54 ml/m  RA Volume:   25.10 ml  14.63 ml/m LA Biplane Vol: 42.9 ml 25.01 ml/m AORTIC VALVE LVOT Vmax:   117.00 cm/s LVOT Vmean:  84.100 cm/s LVOT VTI:    0.236 m  AORTA Ao Root diam: 3.10 cm Ao Asc diam:  3.60 cm  MITRAL VALVE                TRICUSPID VALVE MV Area (PHT):              TR Peak grad:   24.8 mmHg MV Decel Time:              TR Vmax:        249.00 cm/s MV E velocity: 60.80 cm/s   Estimated RAP:  3.00 mmHg MV A velocity: 101.00 cm/s  RVSP:           27.8 mmHg MV E/A ratio:  0.60 SHUNTS Systemic VTI:  0.24 m Systemic Diam: 1.70 cm  Jodelle Red MD Electronically signed by Jodelle Red MD Signature Date/Time: 04/02/2023/5:56:18 PM    Final             Risk Assessment/Calculations:             Physical Exam:   VS:  BP 130/82   Pulse 73   Ht 5\' 4"  (1.626 m)   Wt 141 lb (64 kg)   BMI 24.20 kg/m    Wt Readings from Last 3 Encounters:  04/10/23 141 lb (64 kg)  11/06/22 146 lb 12.8 oz (66.6 kg)  08/16/22 147 lb 2 oz (66.7 kg)    GEN: Well nourished, well developed in no acute distress NECK: No JVD; No carotid bruits CARDIAC: ***RRR, no murmurs, rubs, gallops RESPIRATORY:  Clear to auscultation without rales, wheezing or rhonchi  ABDOMEN: Soft, non-tender, non-distended EXTREMITIES:  No edema; No deformity   ASSESSMENT AND PLAN: .   ***       Dispo: ***  Signed, Alver Sorrow, NP

## 2023-04-10 NOTE — Patient Instructions (Signed)
Medication Instructions:  Continue your current medications.   *If you need a refill on your cardiac medications before your next appointment, please call your pharmacy*  Testing/Procedures: Your EKG today showed normal sinus rhythm which is a good result.   Follow-Up: At [facility], you and your health needs are our priority.  As part of our continuing mission to provide you with exceptional heart care, we have created designated Provider Care Teams.  These Care Teams include your primary Cardiologist (physician) and Advanced Practice Providers (APPs -  Physician Assistants and Nurse Practitioners) who all work together to provide you with the care you need, when you need it.  We recommend signing up for the patient portal called "MyChart".  Sign up information is provided on this After Visit Summary.  MyChart is used to connect with patients for Virtual Visits (Telemedicine).  Patients are able to view lab/test results, encounter notes, upcoming appointments, etc.  Non-urgent messages can be sent to your provider as well.   To learn more about what you can do with MyChart, go to [email or web address].    Your next appointment:   In March as scheduled with Dr. Duke Salvia  Other Instructions  Heart Healthy Diet Recommendations: A low-salt diet is recommended. Meats should be grilled, baked, or boiled. Avoid fried foods. Focus on lean protein sources like fish or chicken with vegetables and fruits. The American Heart Association is a Chief Technology Officer!  American Heart Association Diet and Lifeystyle Recommendations    Exercise recommendations: The American Heart Association recommends 150 minutes of moderate intensity exercise weekly. Try 30 minutes of moderate intensity exercise 4-5 times per week. This could include walking, jogging, or swimming.

## 2023-04-11 ENCOUNTER — Encounter (HOSPITAL_BASED_OUTPATIENT_CLINIC_OR_DEPARTMENT_OTHER): Payer: Self-pay | Admitting: Family

## 2023-04-12 ENCOUNTER — Telehealth: Payer: Self-pay

## 2023-04-12 NOTE — Telephone Encounter (Signed)
Called re: PREP program referral, left voicemail requesting return call.  

## 2023-04-15 ENCOUNTER — Telehealth: Payer: Self-pay

## 2023-04-15 NOTE — Telephone Encounter (Signed)
Returned her call,  to discuss PREP program referral; she would like to attend at [name] on [facility], every M/@ 2:30-3:35; will contact Friday this week/Monday next week to set up assessment visit.

## 2023-04-19 ENCOUNTER — Telehealth: Payer: Self-pay

## 2023-04-19 NOTE — Telephone Encounter (Signed)
Called to discuss PREP class schedule at the [facility] to see if she could switch to another class in October; she is able to attend the October 14 class on M/W at 12:30; will contact next month to set up assessment visit

## 2023-04-22 ENCOUNTER — Ambulatory Visit (HOSPITAL_BASED_OUTPATIENT_CLINIC_OR_DEPARTMENT_OTHER): Payer: HMO | Admitting: Cardiology

## 2023-04-22 DIAGNOSIS — M7062 Trochanteric bursitis, left hip: Secondary | ICD-10-CM | POA: Diagnosis not present

## 2023-04-24 DIAGNOSIS — Z23 Encounter for immunization: Secondary | ICD-10-CM | POA: Diagnosis not present

## 2023-04-25 ENCOUNTER — Ambulatory Visit (INDEPENDENT_AMBULATORY_CARE_PROVIDER_SITE_OTHER): Payer: HMO | Admitting: Family Medicine

## 2023-04-25 ENCOUNTER — Encounter (HOSPITAL_BASED_OUTPATIENT_CLINIC_OR_DEPARTMENT_OTHER): Payer: Self-pay

## 2023-04-25 VITALS — BP 142/86 | HR 71 | Ht 64.0 in | Wt 141.0 lb

## 2023-04-25 DIAGNOSIS — M25552 Pain in left hip: Secondary | ICD-10-CM

## 2023-04-25 DIAGNOSIS — G8929 Other chronic pain: Secondary | ICD-10-CM | POA: Diagnosis not present

## 2023-04-25 MED ORDER — LORAZEPAM 0.5 MG PO TABS: ORAL_TABLET | ORAL | 0 refills | Status: DC

## 2023-04-25 NOTE — Progress Notes (Signed)
Rubin Payor, PhD, LAT, ATC acting as a scribe for [name], MD.  [name] is a 82 y.o. female who presents to [facility] at [facility] today for L hip pain. Pt was previously seen by Dr. Denyse Amass on 08/10/21 for a sacral fx.  Today, pt c/o L hip pain ongoing since May. Pt has previously seen by neurosurgery and orthopedic surgery for her back and L hip pain. Pt locates pain to the lateral aspect of her L hip w/ a "burning" pain.   Radiating pain: no LE numbness/tingling: no LE weakness: no Aggravates: reaching for something high in the cabinet, L-side lying,  Treatments tried: PT, Tramadol, Tylenol, heat, ice, dry needling  Dx imaging: 02/12/23 L hip XR (done @ [facility])  08/13/21 Sacrum SI joints MRI  01/12/18 L-spine MRI  Pertinent review of systems: No fevers or chills  Relevant historical information: Osteoporosis   Exam:  BP (!) 142/86   Pulse 71   Ht 5\' 4"  (1.626 m)   Wt 141 lb (64 kg)   SpO2 97%   BMI 24.20 kg/m  General: Well Developed, well nourished, and in no acute distress.   MSK: Left hip: Normal-appearing Tender palpation at greater trochanter. Range of motion limited flexion rotation and abduction mildly. Hip abduction strength is diminished 4/5 with pain.  External rotation strength is intact without pain. Mild antalgic gait.    Lab and Radiology Results  X-ray images left hip obtained July 2024 at PCP office provided by patient on CD personally and independently interpreted. Mild hip arthritis.  No acute fractures are visible.     Assessment and Plan: 82 y.o. female with left lateral hip and buttocks pain.  This has been ongoing for several months.  She is already had a great trial of physical therapy at [facility] and at [facility] physical therapy all without much benefit.  She has had trials of 2 different steroid injections into the lateral hip area 1 at orthopedics and 1 at  neurosurgery all with benefit only lasting a week or 2.  At this point clinically she acts as though she has trochanteric bursitis but she has not improved as though as I would expect for this condition.  I am concerned that she may have a hip abductor tear or an insufficiency fracture to explain her pain.  Plan for MRI of the hip to evaluate for both of these conditions.  Recheck after MRI.  PDMP reviewed during this encounter. Orders Placed This Encounter  Procedures   MR HIP LEFT WO CONTRAST    Standing Status:   Future    Standing Expiration Date:   04/24/2024    Order Specific Question:   What is the patient's sedation requirement?    Answer:   No Sedation    Order Specific Question:   Does the patient have a pacemaker or implanted devices?    Answer:   No    Order Specific Question:   Preferred imaging location?    Answer:   [facility] (table limit-550lbs)   Meds ordered this encounter  Medications   LORazepam (ATIVAN) 0.5 MG tablet    Sig: 1-2 tabs 30 - 60 min prior to MRI. Do not drive with this medicine.    Dispense:  4 tablet    Refill:  0     Discussed warning signs or symptoms. Please see discharge instructions. Patient expresses understanding.   The above documentation has been  reviewed and is accurate and complete [name], M.D.

## 2023-04-25 NOTE — Patient Instructions (Addendum)
Thank you for coming in today.   You should hear from MRI scheduling within 1 week. If you do not hear please let me know.    Recheck after we get the report from the MRI back.

## 2023-04-29 ENCOUNTER — Other Ambulatory Visit: Payer: Self-pay

## 2023-04-29 ENCOUNTER — Encounter (HOSPITAL_BASED_OUTPATIENT_CLINIC_OR_DEPARTMENT_OTHER): Payer: Self-pay | Admitting: *Deleted

## 2023-04-29 ENCOUNTER — Emergency Department (HOSPITAL_BASED_OUTPATIENT_CLINIC_OR_DEPARTMENT_OTHER): Payer: HMO

## 2023-04-29 ENCOUNTER — Emergency Department (HOSPITAL_BASED_OUTPATIENT_CLINIC_OR_DEPARTMENT_OTHER)
Admission: EM | Admit: 2023-04-29 | Discharge: 2023-04-29 | Disposition: A | Discharge status: Home / Self Care | Payer: HMO | Attending: Emergency Medicine | Admitting: Emergency Medicine

## 2023-04-29 ENCOUNTER — Other Ambulatory Visit: Payer: Self-pay | Admitting: *Deleted

## 2023-04-29 DIAGNOSIS — R197 Diarrhea, unspecified: Secondary | ICD-10-CM | POA: Insufficient documentation

## 2023-04-29 DIAGNOSIS — Z23 Encounter for immunization: Secondary | ICD-10-CM | POA: Diagnosis not present

## 2023-04-29 DIAGNOSIS — Z471 Aftercare following joint replacement surgery: Secondary | ICD-10-CM | POA: Diagnosis not present

## 2023-04-29 DIAGNOSIS — G8929 Other chronic pain: Secondary | ICD-10-CM | POA: Insufficient documentation

## 2023-04-29 DIAGNOSIS — S0990XA Unspecified injury of head, initial encounter: Secondary | ICD-10-CM

## 2023-04-29 DIAGNOSIS — M25552 Pain in left hip: Secondary | ICD-10-CM | POA: Diagnosis not present

## 2023-04-29 DIAGNOSIS — W01110A Fall on same level from slipping, tripping and stumbling with subsequent striking against sharp glass, initial encounter: Secondary | ICD-10-CM | POA: Insufficient documentation

## 2023-04-29 DIAGNOSIS — E878 Other disorders of electrolyte and fluid balance, not elsewhere classified: Secondary | ICD-10-CM | POA: Diagnosis not present

## 2023-04-29 DIAGNOSIS — W19XXXA Unspecified fall, initial encounter: Secondary | ICD-10-CM

## 2023-04-29 DIAGNOSIS — S022XXA Fracture of nasal bones, initial encounter for closed fracture: Secondary | ICD-10-CM | POA: Insufficient documentation

## 2023-04-29 DIAGNOSIS — E785 Hyperlipidemia, unspecified: Secondary | ICD-10-CM

## 2023-04-29 DIAGNOSIS — E871 Hypo-osmolality and hyponatremia: Secondary | ICD-10-CM | POA: Diagnosis not present

## 2023-04-29 DIAGNOSIS — S0531XA Ocular laceration without prolapse or loss of intraocular tissue, right eye, initial encounter: Secondary | ICD-10-CM | POA: Diagnosis not present

## 2023-04-29 DIAGNOSIS — S0181XA Laceration without foreign body of other part of head, initial encounter: Secondary | ICD-10-CM | POA: Diagnosis not present

## 2023-04-29 DIAGNOSIS — I10 Essential (primary) hypertension: Secondary | ICD-10-CM

## 2023-04-29 DIAGNOSIS — R112 Nausea with vomiting, unspecified: Secondary | ICD-10-CM

## 2023-04-29 LAB — COMPREHENSIVE METABOLIC PANEL
ALT: 16 U/L (ref 0–44)
AST: 26 U/L (ref 15–41)
Albumin: 4.3 g/dL (ref 3.5–5.0)
Alkaline Phosphatase: 46 U/L (ref 38–126)
Anion gap: 11 (ref 5–15)
BUN: 19 mg/dL (ref 8–23)
CO2: 26 mmol/L (ref 22–32)
Calcium: 9.7 mg/dL (ref 8.9–10.3)
Chloride: 91 mmol/L — ABNORMAL LOW (ref 98–111)
Creatinine, Ser: 0.99 mg/dL (ref 0.44–1.00)
GFR, Estimated: 57 mL/min — ABNORMAL LOW (ref >60)
Glucose, Bld: 110 mg/dL — ABNORMAL HIGH (ref 70–99)
Potassium: 3.5 mmol/L (ref 3.5–5.1)
Sodium: 128 mmol/L — ABNORMAL LOW (ref 135–145)
Total Bilirubin: 0.8 mg/dL (ref 0.3–1.2)
Total Protein: 6.8 g/dL (ref 6.5–8.1)

## 2023-04-29 LAB — CK: Total CK: 161 U/L (ref 38–234)

## 2023-04-29 LAB — CBC WITH DIFFERENTIAL/PLATELET
Abs Immature Granulocytes: 0.05 10*3/uL (ref 0.00–0.07)
Basophils Absolute: 0.1 10*3/uL (ref 0.0–0.1)
Basophils Relative: 1 %
Eosinophils Absolute: 0 10*3/uL (ref 0.0–0.5)
Eosinophils Relative: 0 %
HCT: 43.2 % (ref 36.0–46.0)
Hemoglobin: 14.7 g/dL (ref 12.0–15.0)
Immature Granulocytes: 1 %
Lymphocytes Relative: 17 %
Lymphs Abs: 1.5 10*3/uL (ref 0.7–4.0)
MCH: 32 pg (ref 26.0–34.0)
MCHC: 34 g/dL (ref 30.0–36.0)
MCV: 93.9 fL (ref 80.0–100.0)
Monocytes Absolute: 0.9 10*3/uL (ref 0.1–1.0)
Monocytes Relative: 10 %
Neutro Abs: 6.2 10*3/uL (ref 1.7–7.7)
Neutrophils Relative %: 71 %
Platelets: 251 10*3/uL (ref 150–400)
RBC: 4.6 MIL/uL (ref 3.87–5.11)
RDW: 13.7 % (ref 11.5–15.5)
WBC: 8.7 10*3/uL (ref 4.0–10.5)
nRBC: 0 % (ref 0.0–0.2)

## 2023-04-29 MED ORDER — TETANUS-DIPHTH-ACELL PERTUSSIS 5-2.5-18.5 LF-MCG/0.5 IM SUSY
0.5000 mL | PREFILLED_SYRINGE | Freq: Once | INTRAMUSCULAR | Status: AC
  Administered 2023-04-29: 0.5 mL via INTRAMUSCULAR
  Filled 2023-04-29: qty 0.5

## 2023-04-29 MED ORDER — SODIUM CHLORIDE 0.9 % IV BOLUS
500.0000 mL | Freq: Once | INTRAVENOUS | Status: AC
  Administered 2023-04-29: 500 mL via INTRAVENOUS

## 2023-04-29 MED ORDER — LIDOCAINE-EPINEPHRINE (PF) 2 %-1:200000 IJ SOLN
10.0000 mL | Freq: Once | INTRAMUSCULAR | Status: AC
  Administered 2023-04-29: 10 mL
  Filled 2023-04-29: qty 20

## 2023-04-29 MED ORDER — HYDRALAZINE HCL 50 MG PO TABS: 50.0000 mg | ORAL_TABLET | Freq: Two times a day (BID) | ORAL | 3 refills | Status: DC

## 2023-04-29 NOTE — ED Provider Notes (Signed)
EMERGENCY DEPARTMENT AT [facility]  Provider Note  CSN: [id number] Arrival date & time: 04/29/23 0255  History Chief Complaint  Patient presents with   Marletta Lor    [name] is a 82 y.o. female with chronic L hip pain, attributed to bursitis, but in process of being worked up and scheduled for MRI soon. She reports having N/V/D for the last few days. Was sitting on the toilet around 2100hrs when her R foot fell asleep, when she tried to get up she fell forward, striking her head on the glass shower door and feeling a crack in her neck. She was unable to get off the floor for a couple of hours but then managed to crawl to her bed and call her daughter who came to the house and found her with a laceration on her forehead, brought her to the ED. She reports she took Tramadol and APAP at home but this was after she fell.    Home Medications Prior to Admission medications   Medication Sig Start Date End Date Taking? Authorizing Provider  mupirocin ointment (BACTROBAN) 2 % APPLY SPARINGLY TO AFFECTED AREA TWICE A DAY 03/21/23  Yes [provider]  amLODipine (NORVASC) 5 MG tablet Take 1 tablet (5 mg total) by mouth daily. 04/10/23   Alver Sorrow, NP  atorvastatin (LIPITOR) 20 MG tablet Take 1 tablet (20 mg total) by mouth daily. 04/10/23   Alver Sorrow, NP  Biotin 5000 MCG CAPS Take 1 capsule by mouth daily.     [provider]  Calcium Carbonate (CALTRATE 600 PO) Take 1 tablet by mouth 2 (two) times a day.     [provider]  Cholecalciferol 25 MCG (1000 UT) capsule Take 1,000 Units by mouth daily.    [provider]  denosumab (PROLIA) 60 MG/ML SOSY injection Inject 60 mg into the skin every 6 (six) months.    [provider]  diclofenac sodium (VOLTAREN) 1 % GEL 1 application. 04/16/18   [provider]  enalapril (VASOTEC) 20 MG tablet Take 1 tablet (20 mg total) by mouth 2 (two) times daily. 04/10/23    Alver Sorrow, NP  EVENING PRIMROSE OIL PO Take 1 capsule by mouth 2 (two) times a day.     [provider]  gabapentin (NEURONTIN) 300 MG capsule TAKE 3 CAPSULES BY MOUTH 3 TIMES DAILY. 04/11/23   Anson Fret, MD  hydrALAZINE (APRESOLINE) 50 MG tablet Take 1 tablet (50 mg total) by mouth 2 (two) times daily. 02/13/23   Meriam Sprague, MD  hydrochlorothiazide (HYDRODIURIL) 25 MG tablet Take 1 tablet (25 mg total) by mouth daily. 04/10/23   Alver Sorrow, NP  levothyroxine (SYNTHROID) 75 MCG tablet Take 1 tablet (75 mcg total) by mouth daily. 05/21/22   Carlus Pavlov, MD  LORazepam (ATIVAN) 0.5 MG tablet 1-2 tabs 30 - 60 min prior to MRI. Do not drive with this medicine. 04/25/23   Rodolph Bong, MD  meloxicam (MOBIC) 15 MG tablet Take 15 mg by mouth daily. 04/10/23   [provider]  methylcellulose packet Take 1 each by mouth daily. CITRICEL    [provider]  metroNIDAZOLE (METROCREAM) 0.75 % cream Apply 1 application topically 2 (two) times daily.    [provider]  NONFORMULARY OR COMPOUNDED ITEM Apply 1-2 g topically in the morning, at noon, in the evening, and at bedtime. Compound cream: Meloxicam 0.5% Doxepin 3% Amantadine 3% Dextromethorphan 2% Lidocaine 2%  Sig: Apply 1-2 grams to the affected area 3-4 times daily. Dispense 240 grams with 5 refills. 11/12/22   Anson Fret, MD  pantoprazole (PROTONIX) 40 MG tablet Take 1 tablet by mouth daily as needed. 11/23/15   [provider]  traZODone (DESYREL) 50 MG tablet Take 25-100 mg by mouth at bedtime as needed. 01/17/23   [provider]  tretinoin (RETIN-A) 0.05 % cream Apply 1 application topically at bedtime.    [provider]     Allergies    Patient has no known allergies.   Review of Systems   Review of Systems Please see HPI for pertinent positives and negatives  Physical Exam BP (!) 168/88   Pulse 82   Temp 98 F (36.7 C)   Resp 18    Ht 5\' 4"  (1.626 m)   Wt 63.5 kg   SpO2 100%   BMI 24.03 kg/m   Physical Exam Vitals and nursing note reviewed.  Constitutional:      Appearance: Normal appearance.  HENT:     Head: Normocephalic.     Comments: Laceration R forehead    Nose: Nose normal.     Comments: Soft tissue swelling bridge of nose    Mouth/Throat:     Mouth: Mucous membranes are moist.  Eyes:     Extraocular Movements: Extraocular movements intact.     Conjunctiva/sclera: Conjunctivae normal.  Neck:     Comments: In c-collar Cardiovascular:     Rate and Rhythm: Normal rate.  Pulmonary:     Effort: Pulmonary effort is normal.     Breath sounds: Normal breath sounds.  Abdominal:     General: Abdomen is flat.     Palpations: Abdomen is soft.     Tenderness: There is no abdominal tenderness.  Musculoskeletal:        General: No swelling, tenderness, deformity or signs of injury. Normal range of motion.  Skin:    General: Skin is warm and dry.  Neurological:     General: No focal deficit present.     Mental Status: She is alert.  Psychiatric:        Mood and Affect: Mood normal.     ED Results / Procedures / Treatments   EKG See MUSE  Procedures .Marland KitchenLaceration Repair  Date/Time: 04/29/2023 4:29 AM  Performed by: Pollyann Savoy, MD Authorized by: Pollyann Savoy, MD   Consent:    Consent obtained:  Verbal   Consent given by:  Patient Anesthesia:    Anesthesia method:  Local infiltration   Local anesthetic:  Lidocaine 2% WITH epi Laceration details:    Location:  Face   Face location:  Forehead   Length (cm):  3 Pre-procedure details:    Preparation:  Patient was prepped and draped in usual sterile fashion Treatment:    Area cleansed with:  Saline   Amount of cleaning:  Standard   Irrigation solution:  Sterile saline   Irrigation method:  Syringe Skin repair:    Repair method:  Sutures   Suture size:  5-0   Suture material:  Nylon   Suture technique:  Simple interrupted    Number of sutures:  6 Approximation:    Approximation:  Close Repair type:    Repair type:  Simple Post-procedure details:    Dressing:  Non-adherent dressing   Procedure completion:  Tolerated well, no immediate complications   Medications Ordered in the ED Medications  lidocaine-EPINEPHrine (XYLOCAINE W/EPI) 2 %-1:200000 (PF) injection 10 mL (  has no administration in time range)  sodium chloride 0.9 % bolus 500 mL (0 mLs Intravenous Stopped 04/29/23 0453)  Tdap (BOOSTRIX) injection 0.5 mL (0.5 mLs Intramuscular Given 04/29/23 0319)    Initial Impression and Plan  Patient here with head and neck injury after a fall. She has had N/V/D for a few days, so will check labs and give IVF. Sounds like she was sitting on the toilet so long her leg fell asleep contributing to her fall. She was on the floor for several hours, will also check CK. CT's ordered for head, face and C-spine. No other acute bony injuries. Her other complaints of leg pain are chronic.   ED Course   Clinical Course as of 04/29/23 0515  Mon Apr 29, 2023  1610 CBC is normal.  [CS]  0407 CK is normal.   CMP with mild hyponatremia and hypochloremia, likely from volume loss. Otherwise normal.  [CS]  0408 I personally viewed the images from radiology studies and agree with radiologist interpretation: CT head/c-spine without acute fracture, CT face with minimally displaced nasal bone fracture.   Patient reports her neck pain has actually been ongoing for some time, previously wore a collar at home. Do not feel additional imaging is needed.   Wound repaired as above. Wound care instructions given, suture removal in about a week.  [CS]  [phone] Patient able to ambulate with a steady gait. No indication for admission. Recommend wound care, advance PO as tolerated. RTED for any other concerns.  [CS]    Clinical Course User Index [CS] Pollyann Savoy, MD     MDM Rules/Calculators/A&P Medical Decision Making Given presenting  complaint, I considered that admission might be necessary. After review of results from ED lab and/or imaging studies, admission to the hospital is not indicated at this time.    Problems Addressed: Chronic left hip pain: chronic illness or injury with exacerbation, progression, or side effects of treatment Closed fracture of nasal bone, initial encounter: acute illness or injury Fall, initial encounter: acute illness or injury Forehead laceration, initial encounter: acute illness or injury Head injury, initial encounter: acute illness or injury Hyponatremia: acute illness or injury Nausea vomiting and diarrhea: acute illness or injury  Amount and/or Complexity of Data Reviewed Labs: ordered. Decision-making details documented in ED Course. Radiology: ordered and independent interpretation performed. Decision-making details documented in ED Course. ECG/medicine tests: ordered and independent interpretation performed. Decision-making details documented in ED Course.  Risk Prescription drug management. Decision regarding hospitalization.     Final Clinical Impression(s) / ED Diagnoses Final diagnoses:  Fall, initial encounter  Head injury, initial encounter  Forehead laceration, initial encounter  Hyponatremia  Closed fracture of nasal bone, initial encounter  Chronic left hip pain  Nausea vomiting and diarrhea    Rx / DC Orders ED Discharge Orders     None        Pollyann Savoy, MD 04/29/23 [phone]

## 2023-04-29 NOTE — ED Notes (Signed)
Provided pt with water for PO challenge.

## 2023-04-29 NOTE — ED Notes (Addendum)
Reviewed AVS with patient and daughter, both expressed understanding of directions, deny further questions at this time.

## 2023-04-29 NOTE — ED Notes (Signed)
Pt tolerating PO fluids, ambulated with steady gait with staff standby. States she walks with a walker at home.

## 2023-04-29 NOTE — ED Triage Notes (Addendum)
Pt states that she has been sick with GI symptoms for the past couple days. N/v/d. States that she was sitting on her toilet last night at 2100 and fell forward hitting the anterior aspect of her head. Laceration noted above her right eye. C/o posterior neck pain. C-collar placed on arrival. Also c/o of left hip pain. Pt able to stand and pivot into bed on arrival. C/o of right upper arm hurting. No obvious deformity. Bruising to her upper lip area. Took tramadol and 2 tylenol around 2300

## 2023-05-02 ENCOUNTER — Ambulatory Visit
Admission: RE | Admit: 2023-05-02 | Discharge: 2023-05-02 | Disposition: A | Discharge status: Home / Self Care | Payer: HMO | Source: Ambulatory Visit | Attending: Family Medicine | Admitting: Family Medicine

## 2023-05-02 DIAGNOSIS — G8929 Other chronic pain: Secondary | ICD-10-CM | POA: Diagnosis not present

## 2023-05-02 DIAGNOSIS — M25552 Pain in left hip: Secondary | ICD-10-CM | POA: Diagnosis not present

## 2023-05-02 DIAGNOSIS — M7602 Gluteal tendinitis, left hip: Secondary | ICD-10-CM | POA: Diagnosis not present

## 2023-05-06 DIAGNOSIS — E871 Hypo-osmolality and hyponatremia: Secondary | ICD-10-CM | POA: Diagnosis not present

## 2023-05-06 DIAGNOSIS — Z4802 Encounter for removal of sutures: Secondary | ICD-10-CM | POA: Diagnosis not present

## 2023-05-06 DIAGNOSIS — S0181XD Laceration without foreign body of other part of head, subsequent encounter: Secondary | ICD-10-CM | POA: Diagnosis not present

## 2023-05-06 DIAGNOSIS — Z09 Encounter for follow-up examination after completed treatment for conditions other than malignant neoplasm: Secondary | ICD-10-CM | POA: Diagnosis not present

## 2023-05-06 DIAGNOSIS — M810 Age-related osteoporosis without current pathological fracture: Secondary | ICD-10-CM | POA: Diagnosis not present

## 2023-05-06 DIAGNOSIS — S022XXG Fracture of nasal bones, subsequent encounter for fracture with delayed healing: Secondary | ICD-10-CM | POA: Diagnosis not present

## 2023-05-08 ENCOUNTER — Telehealth: Payer: Self-pay

## 2023-05-08 NOTE — Telephone Encounter (Signed)
Called to discuss participation in next PREP class at [facility] on October 14, she states that she has fallen, does not feel good, and is having hip pain, awaiting results of MRI; will postpone for now; asked her to reach out to her provider when she is better/ready/recovered to send another referral and we can get her enrolled later this year of even 2025

## 2023-05-16 DIAGNOSIS — I10 Essential (primary) hypertension: Secondary | ICD-10-CM | POA: Diagnosis not present

## 2023-05-16 DIAGNOSIS — E871 Hypo-osmolality and hyponatremia: Secondary | ICD-10-CM | POA: Diagnosis not present

## 2023-05-16 DIAGNOSIS — R2689 Other abnormalities of gait and mobility: Secondary | ICD-10-CM | POA: Diagnosis not present

## 2023-05-16 DIAGNOSIS — I951 Orthostatic hypotension: Secondary | ICD-10-CM | POA: Diagnosis not present

## 2023-05-16 DIAGNOSIS — R42 Dizziness and giddiness: Secondary | ICD-10-CM | POA: Diagnosis not present

## 2023-05-16 NOTE — Progress Notes (Signed)
Hip MRI shows mild to medium arthritis in both hips, left hip bursitis/tendinitis and medium tendinitis of the hamstring tendons at the buttocks on both sides.  Recommend return to clinic to talk about these results and what we can do about it.

## 2023-05-20 ENCOUNTER — Other Ambulatory Visit: Payer: Self-pay

## 2023-05-20 ENCOUNTER — Ambulatory Visit: Payer: HMO | Admitting: Family Medicine

## 2023-05-20 ENCOUNTER — Encounter: Payer: Self-pay | Admitting: Family Medicine

## 2023-05-20 ENCOUNTER — Ambulatory Visit (INDEPENDENT_AMBULATORY_CARE_PROVIDER_SITE_OTHER): Payer: HMO

## 2023-05-20 VITALS — BP 124/80 | HR 66 | Ht 64.0 in | Wt 132.0 lb

## 2023-05-20 DIAGNOSIS — M25511 Pain in right shoulder: Secondary | ICD-10-CM

## 2023-05-20 DIAGNOSIS — G8929 Other chronic pain: Secondary | ICD-10-CM

## 2023-05-20 DIAGNOSIS — M19011 Primary osteoarthritis, right shoulder: Secondary | ICD-10-CM | POA: Diagnosis not present

## 2023-05-20 DIAGNOSIS — M25552 Pain in left hip: Secondary | ICD-10-CM

## 2023-05-20 NOTE — Patient Instructions (Signed)
Thank you for coming in today.  You received an injection today. Seek immediate medical attention if the joint becomes red, extremely painful, or is oozing fluid.  Please get an Xray today before you leave  A referral has been placed to see Eoin at [facility] for your right shoulder.

## 2023-05-20 NOTE — Progress Notes (Unsigned)
I, Stevenson Clinch, CMA acting as a scribe for Alexis Graham, MD.  [name] is a 82 y.o. female who presents to [facility] at [facility] today for f/u L hip pain w/ MRI review. Pt was last seen by Dr. Denyse Amass on 04/25/23 and a MRI was ordered, Ativan prescribed.  Today, pt reports having a fall 3 weeks ago today. Pt states that she was sitting down to pass a BM when her right leg fell asleep. She thought she had her foot under her but when she went to stand she fell forward into the corner of the tub hitting the right side of her face. She notes that she did have LOC. Pt lives alone and notes that she did not have any type of Life Alert-type device with her. She was eventually able to pull herself up but did not go to the ED immediately but was seen later that day. Since time of injury, c/o pain in right arm and right side of her neck. Pt reports continued hip pain, feels that her hip sx are more severe than her head injury.   Dx imaging: 05/02/23 L hip MRI 02/12/23 L hip XR (done @ [facility])             08/13/21 Sacrum SI joints MRI             01/12/18 L-spine MRI  She does receive intermittent epidural steroid injections from neurosurgery Dr. Danielle Dess.  Pertinent review of systems: No fevers or chills  Relevant historical information: Hypertension.  Pulmonary hypertension.  Diabetes.   Exam:  BP 124/80   Pulse 66   Ht 5\' 4"  (1.626 m)   Wt 132 lb (59.9 kg)   SpO2 97%   BMI 22.66 kg/m  General: Well Developed, well nourished, and in no acute distress.  HEENT: Contusion and healing laceration right eyebrow. MSK: Right shoulder normal-appearing Decreased motion.  Abduction limited to 100 degrees.  External and internal rotation are intact. Strength reduced abduction.  Left hip: Normal-appearing Tender palpation greater trochanter.  Hip abduction and external rotation strength mildly diminished.    Lab and Radiology Results  Procedure: Real-time  Ultrasound Guided Injection of left lateral hip greater trochanter bursa and gluteus minimus and tendon insertion site Device: Philips Affiniti 50G/GE Logiq Images permanently stored and available for review in PACS Verbal informed consent obtained.  Discussed risks and benefits of procedure. Warned about infection, bleeding, hyperglycemia damage to structures among others. Patient expresses understanding and agreement Time-out conducted.   Noted no overlying erythema, induration, or other signs of local infection.   Skin prepped in a sterile fashion.   Local anesthesia: Topical Ethyl chloride.   With sterile technique and under real time ultrasound guidance: 40 mg of Kenalog and 2 ml of Marcaine injected into trochanter bursa near gluteus minimus insertion site. Fluid seen entering the bursa.   Completed without difficulty   Pain moderately  resolved suggesting accurate placement of the medication.   Advised to call if fevers/chills, erythema, induration, drainage, or persistent bleeding.   Images permanently stored and available for review in the ultrasound unit.  Impression: Technically successful ultrasound guided injection.    Diagnostic Limited MSK Ultrasound of: Right shoulder Biceps tendon is intact.  However there is a large hypoechoic fluid structure thought to be subdeltoid bursitis in the anterior and lateral shoulder area. Subscapularis tendon appears to be intact without tear.  Again overlying bursal structure present. Supraspinatus tendon concern for nonretracted rotator  cuff tear is of hypoechoic change within the tendon structure. Infraspinatus tendon appears to be intact. AC DJD and effusion is present Impression: Supraspinatus tear and large subdeltoid bursitis   X-ray images right shoulder obtained today personally and independently interpreted Mild glenohumeral DJD.  AC DJD is present.  No acute fractures are visible. Await formal radiology review  EXAM: MR OF THE  LEFT HIP WITHOUT CONTRAST   TECHNIQUE: Multiplanar, multisequence MR imaging was performed. No intravenous contrast was administered.   COMPARISON:  CT 04/15/2022   FINDINGS: Bones: No acute fracture. No dislocation. No femoral head avascular necrosis. Mild-to-moderate osteoarthritis of the contralateral right hip. Bony pelvis intact without diastasis. SI joints and pubic symphysis within normal limits. No bone marrow edema. No marrow replacing bone lesion.   Articular cartilage and labrum   Articular cartilage: Mild to moderate chondral thinning and surface irregularity. No subchondral marrow signal changes.   Labrum:  Superior labrum is degenerated.  No paralabral cyst.   Joint or bursal effusion   Joint effusion:  None.   Bursae: No abnormal bursal fluid collection.   Muscles and tendons   Muscles and tendons: Mild tendinosis of the left gluteus minimus tendon. Moderate tendinosis of the bilateral hamstring tendon origins. The iliopsoas, rectus femoris, and adductor tendons appear intact without tear or significant tendinosis. Normal muscle bulk and signal intensity without edema, atrophy, or fatty infiltration.   Other findings   Miscellaneous: No soft tissue edema or fluid collection. No inguinal lymphadenopathy.   IMPRESSION: 1. No acute bony abnormality. 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 2. Mild tendinosis of the left gluteus minimus tendon. 3. Moderate tendinosis of the bilateral hamstring tendon origins.     Electronically Signed   By: [name] D.O.   On: 05/16/2023 08:08   I, [name], personally (independently) visualized and performed the interpretation of the images attached in this note.    Assessment and Plan: 82 y.o. female with chronic left hip pain.  Her pain is mostly lateral.  She has mild tendinitis of the gluteus minimus tendon.  However she has had trials of physical therapy and steroid injection in this  area with little benefit.  I am going to repeat an injection using ultrasound guidance to inject the insertion site of the gluteus minimus tendon today.  If this does not help as soon as next week I would recommend trial of intra-articular injection.  If that does not help we should evaluate the lumbar spine more accurately for potential lumbar radiculopathy as explanation for her current pain.  She is receiving epidural steroid injections by Dr. Danielle Dess at neurosurgery.  Additionally in the interval she had a fall hard enough to have a nasal fracture.  However she did hurt her right shoulder as well.  I am concerned for a rotator cuff tear.  Plan for physical therapy with [facility].  If needed consider MRI in the future.   PDMP not reviewed this encounter. Orders Placed This Encounter  Procedures   [place] LIMITED JOINT SPACE STRUCTURES UP RIGHT(NO LINKED CHARGES)    Order Specific Question:   Reason for Exam (SYMPTOM  OR DIAGNOSIS REQUIRED)    Answer:   eval shoulder pain    Order Specific Question:   Preferred imaging location?    Answer:   [facility] Shoulder Right    Standing Status:   Future    Number of Occurrences:   1    Standing Expiration Date:  05/19/2024    Order Specific Question:   Reason for Exam (SYMPTOM  OR DIAGNOSIS REQUIRED)    Answer:   eval shoulder pain    Order Specific Question:   Preferred imaging location?    Answer:   [facility]   Ambulatory referral to Physical Therapy    Referral Priority:   Routine    Referral Type:   Physical Medicine    Referral Reason:   Specialty Services Required    Requested Specialty:   Physical Therapy    Number of Visits Requested:   1   No orders of the defined types were placed in this encounter.    Discussed warning signs or symptoms. Please see discharge instructions. Patient expresses understanding.   The above documentation has been reviewed and is accurate and complete [name],  M.D.

## 2023-05-21 ENCOUNTER — Encounter: Payer: Self-pay | Admitting: Neurology

## 2023-05-23 DIAGNOSIS — S0993XD Unspecified injury of face, subsequent encounter: Secondary | ICD-10-CM | POA: Diagnosis not present

## 2023-05-24 NOTE — Telephone Encounter (Signed)
Patient following up.

## 2023-05-27 ENCOUNTER — Ambulatory Visit (INDEPENDENT_AMBULATORY_CARE_PROVIDER_SITE_OTHER): Payer: HMO | Admitting: Internal Medicine

## 2023-05-27 ENCOUNTER — Encounter: Payer: Self-pay | Admitting: Internal Medicine

## 2023-05-27 ENCOUNTER — Ambulatory Visit (INDEPENDENT_AMBULATORY_CARE_PROVIDER_SITE_OTHER): Payer: HMO | Admitting: Family Medicine

## 2023-05-27 VITALS — BP 100/70 | HR 91 | Ht 64.0 in

## 2023-05-27 VITALS — BP 130/74 | HR 98 | Ht 64.0 in | Wt 137.0 lb

## 2023-05-27 DIAGNOSIS — E890 Postprocedural hypothyroidism: Secondary | ICD-10-CM

## 2023-05-27 DIAGNOSIS — G8929 Other chronic pain: Secondary | ICD-10-CM

## 2023-05-27 DIAGNOSIS — Z8639 Personal history of other endocrine, nutritional and metabolic disease: Secondary | ICD-10-CM

## 2023-05-27 DIAGNOSIS — M545 Low back pain, unspecified: Secondary | ICD-10-CM

## 2023-05-27 NOTE — Progress Notes (Signed)
   Rubin Payor, PhD, LAT, ATC acting as a scribe for [name], MD.  [name] is a 82 y.o. female who presents to [facility] at [facility] today for 1-wk f/u R shoulder and L hip pain. Pt was last seen by Dr. Denyse Amass on 05/20/23 and was given  L GT steroid injection and was referred to [facility].   Today, pt reports that the injection only helped after it was given with the numbing medication but the cortisone.  Notes her pain is primarily located at the posterior left low back into the top of the buttocks.  She thinks the pain is probably coming from her back.  Dx imaging: 05/20/23 R shoulder XR 05/02/23 L hip MRI 02/12/23 L hip XR (done @ [facility]'s office)             08/13/21 Sacrum SI joints MRI             01/12/18 L-spine MRI  Pertinent review of systems: No fevers or chills  Relevant historical information: Lumbar radiculopathy   Exam:  BP 100/70   Pulse 91   Ht 5\' 4"  (1.626 m)   SpO2 95%   BMI 23.52 kg/m  General: Well Developed, well nourished, and in no acute distress.   MSK: L-spine decreased lumbar motion lower extremity strength is intact. Left hip tender palpation left lumbar paraspinal musculature versus SI joint region.      Assessment and Plan: 82 y.o. female with left low back pain.  She certainly has degenerative changes on CT scan lumbar spine visible on CT scan abdomen pelvis September 2023.  Her back pain and radiculopathy has been managed by Dr. Danielle Dess with intermittent injections.  After discussion she will return to his clinic for further evaluation and potential management.  Happy to proceed with other injections of her hip here in clinic.  Could potentially target SI joint or even intra-articular hip.  However I do agree with [name] that her pain is probably coming from her back and not her hip.   PDMP not reviewed this encounter. No orders of the defined types were placed in this encounter.  No orders of the defined types  were placed in this encounter.    Discussed warning signs or symptoms. Please see discharge instructions. Patient expresses understanding.   The above documentation has been reviewed and is accurate and complete [name], M.D.

## 2023-05-27 NOTE — Progress Notes (Signed)
Patient ID: [name], female   DOB: [birth year], 82 y.o.   MRN: [id number]  HPI  [name] is a 82 y.o.-year-old female, returning for f/u for h/o Graves ds and subsequent postablative hypothyroidism. Last visit 1 year ago.  Interim history: She continues to have falls - last 04/2023 >> fractured nose, bruised R eye.She also sprained her R upper arm >> no fracture.  She also has muscle weakness and trochanteric bursitis.  She has dizziness.  She is wondering if she needs meclizine. She had hip steroid injections - last 1 week ago.  Reviewed history: Patient was admitted to the hospital 06/2013 with Takotsubo cardiomyopathy, and a low TSH was found incidentally. She had cardiac catheterization on 06/17/2013 (after blood for TSH was drawn). She was also found to have atrial fibrillation in the hospital, then resolved (on Coreg). The patient's EF also normalized before discharge.  She was scheduled to have a hospital followup appointment with her PCP. Dr. Fabian Sharp repeated a TSH level and added a free T4 and a free T3 and these returned abnormal, suggesting thyrotoxicosis. Of note, her pulse was in the 50s and she was not in atrial fibrillation at the time of the appointment. Patient was referred to endocrinology for for further management.  Thyroid Uptake and scan (12/28/2013): Markedly elevated 24 hr radio iodine uptake of 82% Normal thyroid scan. Findings consistent with Graves disease.  Pt was initially on MMI, then had RAI Tx 01/26/2014 >> developed post ablative hypothyroidism  Pt is currently on levothyroxine 75 mcg daily, taken: - at 6 am - fasting - coffee + cream > 30 min later - at least 30 min from b'fast - no Fe, MVI - + restarted Protonix 3 pm - + Calcium with lunch and dinner - + Biotin 5000 mcg daily - did not remember to stop  - last dose last night Stopped B complex.  Reviewed her TFTs: Lab Results  Component Value Date   TSH 1.40 05/21/2022   TSH  1.77 05/16/2021   TSH 0.79 05/23/2020   TSH 1.47 11/10/2019   TSH 8.69 (H) 09/29/2019   TSH 3.88 06/29/2019   TSH 0.27 (L) 05/18/2019   TSH 0.49 11/20/2018   TSH 0.53 05/22/2018   TSH 1.08 11/18/2017   FREET4 1.06 05/21/2022   FREET4 1.03 05/16/2021   FREET4 0.98 05/23/2020   FREET4 1.02 11/10/2019   FREET4 0.86 09/29/2019   FREET4 0.86 06/29/2019   FREET4 1.39 05/18/2019   FREET4 1.22 11/20/2018   FREET4 1.20 05/22/2018   FREET4 1.02 11/18/2017  fT3 on 06/26/2013: 6.4 (2.3 - 4.2)   TSI's were elevated: Lab Results  Component Value Date   TSI 472 (H) 05/18/2019    Pt denies: - feeling nodules in neck - hoarseness - dysphagia - choking  She also has a h/o HTN, HL, tricuspid regurgitation, mitral valve prolapse, PMR. She had cataract sx. 06/2018.  She has dry eyes. She sees Dr. Charlotte Sanes.  She occasionally uses steroid drops. She is off carvedilol due to fatigue and bradycardia. She has a h/o L4 burst compression fx >> Gabapentin.  She continues on Prolia.  She tolerates this well.  This is managed in [place]. She also has collagenous colitis.  ROS: + See HPI  I reviewed pt's medications, allergies, PMH, social hx, family hx, and changes were documented in the history of present illness. Otherwise, unchanged from my initial visit note.  Past Medical History:  Diagnosis Date   Abnormal EKG  Atrial fibrillation (HCC)    Basal cell carcinoma of nose    removed w/MOHs   CAD (coronary artery disease)    a. 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 EF 30-35% at [facility] 06/2013;  b.  f/u Echo (06/18/13):  EF 60-65%, normal wall motion, Gr 1 DD, mild MVP of post leaflet, mod TR, PASP 63   Thyroid disease    Tricuspid regurgitation    mild to moderate, echo, January, 2011, PA pressure 38 mm mercury   Past Surgical History:  Procedure Laterality Date   CARDIAC CATHETERIZATION  06/17/2013   COLONOSCOPY  12/23/2003   normal (indications: prior adenomas and grandfather with colon cancer)   COLPOSCOPY     EXCISIONAL HEMORRHOIDECTOMY  2003   LEFT HEART CATHETERIZATION WITH CORONARY ANGIOGRAM N/A 06/17/2013   Procedure: LEFT HEART CATHETERIZATION WITH CORONARY ANGIOGRAM;  Surgeon: Peter M Swaziland, MD;  Location: [facility] CATH LAB;  Service: Cardiovascular;  Laterality: N/A;   MOHS SURGERY Left 2011   "side of my nose" (06/17/2013)   TONSILLECTOMY     TOTAL KNEE ARTHROPLASTY  08/2010   TOTAL KNEE ARTHROPLASTY  02/25/2012   Procedure: TOTAL KNEE ARTHROPLASTY;  Surgeon: Loanne Drilling, MD;  Location: [facility] ORS;  Service: Orthopedics;  Laterality: Right;   Social History   Socioeconomic History   Marital status: Widowed    Spouse name: Not on file   Number of children: 1   Years of  education: Not on file   Highest education level: Not on file  Occupational History   Occupation: Homemaker  Tobacco Use   Smoking status: Never   Smokeless tobacco: Never  Vaping Use   Vaping status: Never Used  Substance and Sexual Activity   Alcohol use: Yes    Alcohol/week: 0.0 standard drinks of alcohol    Comment: Rare   Drug use: No   Sexual activity: Never    Birth control/protection: Post-menopausal    Comment: 1st intercourse 82 yo-Fewer than 5 partners  Other Topics Concern   Not on file  Social History Narrative   3 caffeine drinks daily    Widowed   Retired      Lives alone   Right handed   Caffeine: 1.5 cup/day   Social Determinants of Corporate investment banker Strain: Not on file  Food Insecurity: Not on file  Transportation Needs: Not on file  Physical Activity: Not on file  Stress: Not on file  Social Connections: Not on file  Intimate Partner Violence: Not on file   Current Outpatient Medications on File Prior to Visit  Medication Sig Dispense Refill   amLODipine (NORVASC) 5 MG tablet Take 1 tablet (5 mg total) by mouth daily. 90 tablet 3   atorvastatin (LIPITOR) 20 MG tablet Take 1 tablet (20 mg total) by mouth daily. 90 tablet 3   Biotin 5000 MCG CAPS Take 1 capsule by mouth daily.      Calcium Carbonate (CALTRATE 600 PO) Take 1 tablet by mouth 2 (two) times a day.      Cholecalciferol 25 MCG (1000 UT) capsule Take 1,000 Units by mouth daily.     denosumab (PROLIA) 60 MG/ML SOSY injection Inject 60 mg into the skin every 6 (six) months.     diclofenac sodium (VOLTAREN) 1 % GEL 1 application.  2   enalapril (VASOTEC) 20 MG tablet Take 1 tablet (20 mg total) by mouth 2 (two) times daily. 180 tablet 3   EVENING PRIMROSE OIL PO Take 1 capsule by mouth 2 (two) times a day.      gabapentin (NEURONTIN) 300 MG capsule TAKE 3 CAPSULES BY MOUTH 3 TIMES DAILY. 270 capsule 0   hydrALAZINE (APRESOLINE) 50 MG tablet Take 1 tablet (50 mg total) by mouth 2  (two) times daily. 180 tablet 3   hydrochlorothiazide (HYDRODIURIL) 25 MG tablet Take 1 tablet (25 mg total) by mouth daily. 90 tablet 3   levothyroxine (SYNTHROID) 75 MCG tablet Take 1 tablet (75 mcg total) by mouth daily. 90 tablet 3   LORazepam (ATIVAN) 0.5 MG tablet 1-2 tabs 30 - 60 min prior to MRI. Do not drive with this medicine. 4 tablet 0   meloxicam (MOBIC) 15 MG tablet Take 15 mg by mouth daily.     methylcellulose packet Take 1 each by mouth daily. CITRICEL     metroNIDAZOLE (METROCREAM) 0.75 % cream Apply 1 application topically 2 (two) times daily.     mupirocin ointment (BACTROBAN) 2 % APPLY SPARINGLY TO AFFECTED AREA TWICE A DAY     NONFORMULARY OR COMPOUNDED ITEM Apply 1-2 g topically in the morning, at noon, in the evening, and at bedtime. Compound cream: Meloxicam 0.5% Doxepin 3% Amantadine 3% Dextromethorphan 2% Lidocaine 2%  Sig: Apply 1-2 grams to the affected area 3-4 times daily. Dispense 240 grams with 5 refills. 1 each 5   pantoprazole (PROTONIX) 40 MG tablet Take 1 tablet by mouth daily as needed.     traZODone (DESYREL) 50 MG tablet Take 25-100 mg by mouth at bedtime as needed.     tretinoin (RETIN-A) 0.05 % cream Apply 1 application topically at bedtime.     Current Facility-Administered Medications on File Prior to Visit  Medication Dose Route Frequency Provider Last Rate Last Admin   botulinum toxin Type A (BOTOX) injection 100 Units  100 Units Intramuscular Once Anson Fret, MD       No Known Allergies Family History  Problem Relation Age of Onset   Heart attack Mother 69   Hypertension Mother    Heart disease  Mother    Stroke Mother    Heart attack Father 87   Coronary artery disease Father        Multiple Family Members   Hypertension Father    Heart disease Father    Esophageal cancer Paternal Grandmother    Colon cancer Maternal Grandfather    Heart disease Maternal Grandfather    Irritable bowel syndrome Sister        More family  members on father side of    Hypertension Sister    Heart disease Sister    Breast cancer Sister 25   Breast cancer Paternal Aunt        Age 17's   Heart disease Paternal Grandfather    Rectal cancer Neg Hx    Stomach cancer Neg Hx    PE: BP 130/74   Pulse 98   Ht 5\' 4"  (1.626 m)   Wt 137 lb (62.1 kg)   SpO2 99%   BMI 23.52 kg/m   Wt Readings from Last 3 Encounters:  05/27/23 137 lb (62.1 kg)  05/20/23 132 lb (59.9 kg)  04/29/23 140 lb (63.5 kg)   Constitutional: normal weight, in NAD, walks with a cane Eyes: EOMI, no exophthalmos ENT: no thyromegaly, no cervical lymphadenopathy Cardiovascular: tachycardia, RR, No MRG Respiratory: CTA B Musculoskeletal: no deformities Skin: no rashes Neurological: no tremor with outstretched hands  ASSESSMENT: 1. H/o Graves ds  2. Postablative hypothyroidism  3. S/p L4 fracture - She fell backwards >> L4 compression fracture.  - She was on Reclast >> this year was her fourth drug holiday year. - Managed by Dr. Jacquenette Shone in [place] - Discussed pros and cons of Prolia >> started it and continues on this  PLAN:  1. Patient with history of Graves' disease, status post RAI treatment, now with post ablative hypothyroidism -No signs of active Graves' ophthalmopathy: No exophthalmos, chemosis, double vision, eye pain, she continues to have dry eyes, chronic for her. -TSI antibody titer was still elevated at last check -I do not feel we need to repeat this today  2. Postablative hypothyroidism - latest thyroid labs reviewed with pt. >> normal: Lab Results  Component Value Date   TSH 1.40 05/21/2022  - she continues on LT4 75 mcg daily - pt has multiple complaints about joint pains but also dizziness/disequilibrium.  She is wondering if this is BPPV, which she remembers having had in the past.  She also tells me that she was told that she was orthostatic after her hospitalization and indeed, reviewing her labs from 04/2023, sodium was low,  at 128 and she did appear dehydrated.  I advised her to stay hydrated, but also to schedule an appointment with PCP for an evaluation of her dizziness.  Blood pressure is not very high or very low today.  Her pulse is elevated. - we discussed about taking the thyroid hormone every day, with water, >30 minutes before breakfast, separated by >4 hours from acid reflux medications, calcium, iron, multivitamins. Pt. is taking it correctly. - will check thyroid tests in 1 week: TSH and fT4.  We cannot check this today because she forgot to stop biotin and she is taking a fairly high dose, 5000 mcg daily.  I advised her to stop the supplements and come back in 1 week for labs. - If labs are abnormal, she will need to return for repeat TFTs in 1.5 months  Orders Placed This Encounter  Procedures   TSH   T4, free   She  needs LT4 refills.  Carlus Pavlov, MD PhD [facility]

## 2023-05-27 NOTE — Patient Instructions (Addendum)
Thank you for coming in today.   The medicine you asked me about was Meclizine.   Follow up with Dr Danielle Dess.   I can do other injection here if you want me to.

## 2023-05-27 NOTE — Patient Instructions (Addendum)
Please continue Levothyroxine 75 mcg daily.  Take the thyroid hormone every day, with water, at least 30 minutes before breakfast, separated by at least 4 hours from: - acid reflux medications - calcium - iron - multivitamins  Please stop Biotin and come back for labs in 1 week.  You should have an endocrinology follow-up appointment in 1 year.

## 2023-05-28 ENCOUNTER — Ambulatory Visit: Payer: PPO | Admitting: Internal Medicine

## 2023-05-28 ENCOUNTER — Ambulatory Visit: Payer: HMO | Admitting: Neurology

## 2023-05-28 DIAGNOSIS — G6289 Other specified polyneuropathies: Secondary | ICD-10-CM | POA: Diagnosis not present

## 2023-05-28 NOTE — Progress Notes (Signed)
05/28/2023: Improved, continue using the same dose 300 units. Use numbing spray for patient, severe pain on injections.working significantly well, >>50% improvement in pain  02/26/2023: improved even further will use higher dose will USE 300 U today and spray with numbing cream!! Can put 300 units into 2 syringes. Right foot is the worst on the cushion of the bottom of each foot and the right one an area in the arch.    12/04/2022 used 200 nad will increase to 300U july  1.23.2024: working significantly well, >>50% improvement in pain. Unfortunately she leaves for [place] on April 15th, not sure we can rebotox before then (12 weeks have to pass)  10.26/2023: Significanty helping. Concentrate into 2 syringes. >> 60% improvement in pain  03/05/2022: Significanty helping. Concentrate into 2 syringes. >> 60% improvement in pain  In the balls of the feet and instep of the right foot (flexor digiotorum brevis, all lumbricals)  11/07/2021: After 3 months the severe pain returned, botox is making a significant difference in pain > 60% improvement in pain. It was best to keep her feet on the chairs and dorsiflex them while I inject bc it hurts a lot. Left foot anterior pad of lower foot and right underneath the toes, on the right the anterior pad of lower foot and part of the instep medially. Keep feet on chair and hold foot in dorsiflexion position tightly while injecting helped with the injection pain.  07/16/2021: unclear how much improvement. We will stop botox for now and see if she reports increased pain when it wears off 04/11/2021: Botox works great > 60% improvement in pain and quality of life, she can feel it wearing off. 01/04/2021: > 50% improvement in pain, continue current dose 10/04/2020: first injections  History: Refractory idiopathic peripheral diabetic polyneuropathy. Risk factors includes many years of prediabetes (see prior HgbA1cs most recent normal 5.5 but 7 years ago 5.9, 13 years ago 6.0 and  14 years ago 5.8) and autoimmune disease both risk factors for small-fiber neuropathy. Hx of diabetic(per-diabetic) neuropathy.   Procedure: All procedures documented were medically necessary, reasonable and appropriate based on the patient's history, medical diagnosis and physician opinion. Verbal informed consent was obtained from the patient, patient was informed of potential risk of procedure, including bruising, bleeding, hematoma formation, infection, muscle weakness, muscle pain, numbness, transient hypertension, transient hyperglycemia and transient insomnia among others. All areas injected were topically clean with isopropyl rubbing alcohol. Nonsterile nonlatex gloves were worn during the procedure.   Injections were given bilaterally to the plantar surface of the feet.  The feet were properly sterilized with evenly spaced sites bilaterally injected each with 10 units of botox using a 1 ml 30-gauge needle.  300 units of Botox were used and none was wasted.  Dx: G62.89 B/B 96045 CPT code and 40981 for additional limb

## 2023-05-29 DIAGNOSIS — G6289 Other specified polyneuropathies: Secondary | ICD-10-CM | POA: Diagnosis not present

## 2023-05-29 MED ORDER — ONABOTULINUMTOXINA 100 UNITS IJ SOLR
300.0000 [IU] | Freq: Once | INTRAMUSCULAR | Status: AC
Start: 2023-05-29 — End: 2023-05-29
  Administered 2023-05-29: 300 [IU] via INTRAMUSCULAR

## 2023-05-29 NOTE — Progress Notes (Signed)
Botox- 100 units x 3 vials Lot: [id number] Expiration: 12/2024 NDC: 4540-9811-91  Bacteriostatic 0.9% Sodium Chloride- 2 mL  Lot: [id number] Expiration: 11-05-2023 NDC: 6213-0865-78  Dx: G62.89 B/B Witnessed by Delmer Islam

## 2023-05-30 ENCOUNTER — Ambulatory Visit (HOSPITAL_BASED_OUTPATIENT_CLINIC_OR_DEPARTMENT_OTHER): Payer: HMO | Admitting: Cardiology

## 2023-05-30 ENCOUNTER — Encounter (HOSPITAL_BASED_OUTPATIENT_CLINIC_OR_DEPARTMENT_OTHER): Payer: Self-pay

## 2023-05-30 ENCOUNTER — Encounter (HOSPITAL_BASED_OUTPATIENT_CLINIC_OR_DEPARTMENT_OTHER): Payer: Self-pay | Admitting: Cardiology

## 2023-05-30 VITALS — BP 128/74 | HR 79 | Ht 64.0 in | Wt 139.4 lb

## 2023-05-30 DIAGNOSIS — E871 Hypo-osmolality and hyponatremia: Secondary | ICD-10-CM | POA: Diagnosis not present

## 2023-05-30 DIAGNOSIS — I10 Essential (primary) hypertension: Secondary | ICD-10-CM

## 2023-05-30 DIAGNOSIS — I700 Atherosclerosis of aorta: Secondary | ICD-10-CM | POA: Diagnosis not present

## 2023-05-30 DIAGNOSIS — I341 Nonrheumatic mitral (valve) prolapse: Secondary | ICD-10-CM | POA: Diagnosis not present

## 2023-05-30 DIAGNOSIS — I340 Nonrheumatic mitral (valve) insufficiency: Secondary | ICD-10-CM | POA: Diagnosis not present

## 2023-05-30 DIAGNOSIS — E785 Hyperlipidemia, unspecified: Secondary | ICD-10-CM

## 2023-05-30 DIAGNOSIS — I951 Orthostatic hypotension: Secondary | ICD-10-CM | POA: Diagnosis not present

## 2023-05-30 NOTE — Progress Notes (Signed)
Cardiology Office Note:  .    Date:  05/30/2023  ID:  [name], DOB 21-Aug-1940, MRN [id number] PCP: Merri Brunette, MD  [facility] Providers Cardiologist:  Jodelle Red, MD     History of Present Illness: .    [name] is a 82 y.o. 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 She is formerly a patient of Dr. Shari Prows, last seen by her 03/01/2022. At that visit she complained of occasional LE edema that was chronic. Blood pressure was well controlled on enalapril, amlodipine, HCTZ, and hydralazine.  Cardiac Hx: History of takotsubo CM in 2014 secondary to Graves disease. She was treated with RAI.  Echocardiogram in 2014 normal LVEF and severe pulmonary HTN with RVSP 63 mmHg. Repeat echo in 2016 showed improvement in PA pressure to normal 25 mmHg, moderate MVP and mild mitral regurgitation. Repeat TTE 04/2020 with LVEF 60-65%, mild LVH, mild MR, normal PASP, mild TR.   She followed up with Gillian Shields, NP on 04/10/2023 where she was not exercising regularly due to a back injury. She was referred to [facility]. Home blood pressures were often 140s-160s, but 130/82 in the office. Discussed proper techniques for monitoring blood pressure and asked to bring home BP cuff at follow-up.  Today, she reports recent orthostatic issues. She states she saw her PCP earlier this week and her blood pressure had dropped from 130/74 sitting to 100/70 standing. HCTZ was stopped and she was prescribed metoprolol. However, she developed facial erythema and headaches so she stopped the metoprolol and resumed her HCTZ. When her blood pressure drops she experiences dizziness. Of note, she did  feel dizzy when moving from sitting in a chair, to moving up onto the exam table. On some evenings, she will start to feel elevated heart rates. This seems to be occurring more frequently than it used to. Her heart rate has been 100+ bpm during these episodes.  Cardiovascular risk factors: Prior clinical ASCVD: CAD s/p NSTEMI 06/2013. She confirms having a history of heart murmur. Comorbid conditions: Hypertension - Started on antihypertensives in her early 34's. 10 mg amlodipine causes swelling. Hyperlipidemia - on atorvastatin 20 mg daily. Metabolic syndrome/Obesity:  Current weight 139 lbs. Chronic inflammatory conditions: IBS, osteoarthritis, polymyalgia rheumatica Tobacco use history: Never Prior pertinent testing and/or incidental findings: TTE 2024 normal LVEF, mild to moderate MR, moderate MVP which was stable from previous.  Exercise level: She complains of left hip and back pain. Current diet: Tries to stay hydrated. Drinks coffee in the morning but no other caffeinated beverages.  She denies any chest pain, shortness of breath, peripheral edema, headaches, syncope, orthopnea, or PND.  ROS:  Please see the history of present illness. ROS otherwise negative except as noted.  (+) Dizziness (+) Occasional palpitations (+) Left hip and back pain  Studies Reviewed: .         Echo  04/02/2023:  1. Left ventricular ejection fraction, by estimation, is 60 to 65%. The  left ventricle has normal function. The left ventricle has no regional  wall motion abnormalities. Left ventricular diastolic parameters are  indeterminate.   2. Right ventricular systolic function is normal. The right ventricular  size is normal. There is normal pulmonary artery systolic  pressure.   3. The mitral valve is abnormal. Mild to moderate mitral valve  regurgitation. No evidence of mitral stenosis. There is moderate late  systolic prolapse of the middle scallop of the posterior leaflet of the  mitral valve.    4. The aortic valve is tricuspid. Aortic valve regurgitation is not  visualized. No aortic stenosis is present.   5. The inferior vena cava is normal in size with greater than 50%  respiratory variability, suggesting right atrial pressure of 3 mmHg.   Comparison(s): Prior images reviewed side by side. Moderate mitral valve  prolapse better visualized on current study.   Physical Exam:    VS:  BP 128/74   Pulse 79   Ht 5\' 4"  (1.626 m)   Wt 139 lb 6.4 oz (63.2 kg)   SpO2 94%   BMI 23.93 kg/m    Wt Readings from Last 3 Encounters:  05/30/23 139 lb 6.4 oz (63.2 kg)  05/27/23 137 lb (62.1 kg)  05/20/23 132 lb (59.9 kg)    Orthostatic VS for the past 24 hrs (Last 3 readings):  BP- Lying Pulse- Lying BP- Sitting Pulse- Sitting BP- Standing at 0 minutes Pulse- Standing at 0 minutes BP- Standing at 3 minutes Pulse- Standing at 3 minutes  05/30/23 1107 151/76 70 150/75 70 124/78 70 164/83 70     GEN: Well nourished, well developed in no acute distress HEENT: Normal, moist mucous membranes NECK: No JVD CARDIAC: regular rhythm, normal S1 and S2, no rubs or gallops. No murmur. VASCULAR: Radial and DP pulses 2+ bilaterally. No carotid bruits RESPIRATORY:  Clear to auscultation without rales, wheezing or rhonchi  ABDOMEN: Soft, non-tender, non-distended MUSCULOSKELETAL:  Ambulates independently SKIN: Warm and dry, no edema NEUROLOGIC:  Alert and oriented x 3. No focal neuro deficits noted. PSYCHIATRIC:  Normal affect   ASSESSMENT AND PLAN: .    Hypertension, with orthostatic symptoms Hyponatremia -orthostatic by vitals today, though baseline BP elevated -Stop HCTZ. -Discussed goal of drinking 64 oz of liquids daily. May need compression stockings -discussed checking home BP. If BP significantly elevated, would change enalapril to ARB and monitor BP -Recheck sodium in a couple weeks.  MVP/MR -no click/murmur appreciated today -stable on echo  History of Graves disease, with  takotsubo cardiomyopathy and atrial fibrillation as complication -no further recurrence of cardiac issues  Aortic atherosclerosis Hyperlipidemia -continue atorvastatin  Dispo: Follow-up with APP in 2-3 months, or sooner as needed.  I,Mathew Stumpf,acting as a Neurosurgeon for [employer], MD.,have documented all relevant documentation on the behalf of Jodelle Red, MD,as directed by  Jodelle Red, MD while in the presence of Jodelle Red, MD.  I, Jodelle Red, MD, have reviewed all documentation for this visit. The documentation on 05/30/23 for the exam, diagnosis, procedures, and orders are all accurate and complete.   Signed, Jodelle Red, MD

## 2023-05-30 NOTE — Patient Instructions (Signed)
Medication Instructions:  STOP HYDROCHLOROTHIAZIDE   Labwork: BMET IN 3 WEEKS   Testing/Procedures: NONE  Follow-Up: 2-3 MONTHS WITH CAITLIN W NP   Any Other Special Instructions Will Be Listed Below (If Applicable). MONITOR YOUR BLOOD PRESSURE DAILY, SEND READINGS VIA MYCHART IF YOU HAVE OR CALL IN TO GIVE THEM.   Try to increase your fluids to at least 64 oz of fluid per day.  Stop the hydrochlorothiazide. Check home blood pressures, and let us know the numbers in a few weeks. If it is consistently high, I would suggest we change the enalapril to a cousin medication that is a bit stronger.  how to check blood pressure:  -sit comfortably in a chair, feet uncrossed and flat on floor, for 5-10 minutes  -arm ideally should rest at the level of the heart. However, arm should be relaxed and not tense (for example, do not hold the arm up unsupported)  -avoid exercise, caffeine, and tobacco for at least 30 minutes prior to BP reading  -don't take BP cuff reading over clothes (always place on skin directly)  -I prefer to know how well the medication is working, so I would like you to take your readings 1-2 hours after taking your blood pressure medication if possible

## 2023-05-31 DIAGNOSIS — M25552 Pain in left hip: Secondary | ICD-10-CM | POA: Diagnosis not present

## 2023-06-03 ENCOUNTER — Other Ambulatory Visit (INDEPENDENT_AMBULATORY_CARE_PROVIDER_SITE_OTHER): Payer: HMO

## 2023-06-03 DIAGNOSIS — E890 Postprocedural hypothyroidism: Secondary | ICD-10-CM | POA: Diagnosis not present

## 2023-06-04 ENCOUNTER — Other Ambulatory Visit: Payer: Self-pay | Admitting: Internal Medicine

## 2023-06-04 DIAGNOSIS — H0014 Chalazion left upper eyelid: Secondary | ICD-10-CM | POA: Diagnosis not present

## 2023-06-04 LAB — TSH: TSH: 1.99 u[IU]/mL (ref 0.35–5.50)

## 2023-06-04 LAB — T4, FREE: Free T4: 1.11 ng/dL (ref 0.60–1.60)

## 2023-06-04 MED ORDER — LEVOTHYROXINE SODIUM 75 MCG PO TABS: 75.0000 ug | ORAL_TABLET | Freq: Every day | ORAL | 3 refills | Status: DC

## 2023-06-04 NOTE — Progress Notes (Signed)
Right shoulder x-ray shows mild arthritis at the small joint at the top of the shoulder.  No fractures are visible.

## 2023-06-06 DIAGNOSIS — H00014 Hordeolum externum left upper eyelid: Secondary | ICD-10-CM | POA: Diagnosis not present

## 2023-06-06 DIAGNOSIS — R350 Frequency of micturition: Secondary | ICD-10-CM | POA: Diagnosis not present

## 2023-06-10 DIAGNOSIS — M5416 Radiculopathy, lumbar region: Secondary | ICD-10-CM | POA: Diagnosis not present

## 2023-06-10 DIAGNOSIS — M5116 Intervertebral disc disorders with radiculopathy, lumbar region: Secondary | ICD-10-CM | POA: Diagnosis not present

## 2023-06-18 ENCOUNTER — Telehealth (HOSPITAL_BASED_OUTPATIENT_CLINIC_OR_DEPARTMENT_OTHER): Payer: Self-pay | Admitting: Family

## 2023-06-18 MED ORDER — OLMESARTAN MEDOXOMIL 20 MG PO TABS: 20.0000 mg | ORAL_TABLET | Freq: Every day | ORAL | 2 refills | Status: DC

## 2023-06-18 NOTE — Telephone Encounter (Signed)
BP on average 142/83. Prior orthostasis with hydrochlorothiazide which was discontinued. Recommend transition Enalapril to ARB per Dr. Cristal Deer last note.   Stop Enalapril. Start Olmesartan 20mg  daily (30 day supply with 2 refills). Recommend nurse visit in 1-2 weeks for BMP and to check BP and bring her BP cuff so we can ensure accuracy.   Alver Sorrow, NP

## 2023-06-18 NOTE — Telephone Encounter (Signed)
Called patient and discussed NP recommendations. Informed patient to bring her blood pressure cuff from home. She verbalized understanding. Elanapril has been discontinued from med list and Olmesartan has been sent to preferred pharmacy. Nurse visit to be scheduled.

## 2023-06-18 NOTE — Telephone Encounter (Signed)
Pt c/o BP issue: STAT if pt c/o blurred vision, one-sided weakness or slurred speech  1. What are your last 5 BP readings?   10/26 11:45 am 102/66 97 9 pm 123/69 87  10/27  11:45 am 144/79 73  9:45 pm 130/85 85  10/28 11:45 am 139/80 74 9:30 pm 136/81 85  10/29 11:30 am 158/86 88 9:15 pm 131/75 97  10/30 11:45 am 132/82 96 9:15 pm 145/83 78  10/31 11 am 132/79 93 9:15 pm 137/78 85  11/03 11:45 am 143/90 88  11/04 AM 142/89 84  11/05 9:45 pm 151/94 81  11/06 11:45 am 154/79 88 9:15 pm 137/77 93  11/07 9:30 pm 143/84 100  11/08 11:45 am 150/91 86 9:30 pm 153/88 75  11/09 9:45 pm 154/87 89  11/10  11:45 am 155/85 74 9:45 pm 165/91 86  11/11 11:45 am 149/95 84 9:45 pm 144/74 91  2. Are you having any other symptoms (ex. Dizziness, headache, blurred vision, passed out)? She can feel her heart pounding if her HR is elevated    3. What is your BP issue? Pt called to give her 2 weeks BP reading

## 2023-06-24 ENCOUNTER — Ambulatory Visit (HOSPITAL_BASED_OUTPATIENT_CLINIC_OR_DEPARTMENT_OTHER): Payer: HMO | Admitting: *Deleted

## 2023-06-24 VITALS — BP 160/84 | HR 98 | Ht 63.0 in | Wt 138.9 lb

## 2023-06-24 DIAGNOSIS — I10 Essential (primary) hypertension: Secondary | ICD-10-CM

## 2023-06-24 DIAGNOSIS — Z5181 Encounter for therapeutic drug level monitoring: Secondary | ICD-10-CM

## 2023-06-24 MED ORDER — SPIRONOLACTONE 25 MG PO TABS: 25.0000 mg | ORAL_TABLET | Freq: Every day | ORAL | 0 refills | Status: DC

## 2023-06-24 NOTE — Patient Instructions (Signed)
Medication Instructions:  START SPIRONOLACTONE 25 MG DAILY   Labwork: BMET IN ABOUT 1 WEEK   Testing/Procedures: NONE  Follow-Up: KEEP AS SCHEDULED

## 2023-06-24 NOTE — Progress Notes (Signed)
   Nurse Visit   Date of Encounter: 06/24/2023 ID: [name], DOB [birth year], MRN [id number]  PCP:  Merri Brunette, MD   [facility] Providers Cardiologist:  Jodelle Red, MD      Visit Details   VS:  Ht 5\' 3"  (1.6 m)   Wt 138 lb 14.4 oz (63 kg)   BMI 24.61 kg/m  , BMI Body mass index is 24.61 kg/m.  Wt Readings from Last 3 Encounters:  06/24/23 138 lb 14.4 oz (63 kg)  05/30/23 139 lb 6.4 oz (63.2 kg)  05/27/23 137 lb (62.1 kg)     Reason for visit: BLOOD PRESSURE CHECK  Performed today: Vitals and Provider consulted:Blood pressure manually 160/84 on home monitor 149/84. Did recheck manually again 158/82 Changes (medications, testing, etc.) : discussed with Dr Cristal Deer, will START SPIRONOLACTONE 25 MG DAILY. BMET IN 1 WEEK  Length of Visit: 15 minutes    Medications Adjustments/Labs and Tests Ordered: Orders Placed This Encounter  Procedures   Basic metabolic panel   Meds ordered this encounter  Medications   spironolactone (ALDACTONE) 25 MG tablet    Sig: Take 1 tablet (25 mg total) by mouth daily.    Dispense:  90 tablet    Refill:  0     Signed, Regis Bill, LPN  47/82/9562 2:15 PM

## 2023-06-26 DIAGNOSIS — H0014 Chalazion left upper eyelid: Secondary | ICD-10-CM | POA: Diagnosis not present

## 2023-07-02 DIAGNOSIS — H903 Sensorineural hearing loss, bilateral: Secondary | ICD-10-CM | POA: Diagnosis not present

## 2023-07-02 DIAGNOSIS — H9113 Presbycusis, bilateral: Secondary | ICD-10-CM | POA: Diagnosis not present

## 2023-07-03 DIAGNOSIS — I10 Essential (primary) hypertension: Secondary | ICD-10-CM | POA: Diagnosis not present

## 2023-07-03 DIAGNOSIS — Z5181 Encounter for therapeutic drug level monitoring: Secondary | ICD-10-CM | POA: Diagnosis not present

## 2023-07-04 LAB — BASIC METABOLIC PANEL
BUN/Creatinine Ratio: 20 (ref 12–28)
BUN: 20 mg/dL (ref 8–27)
CO2: 21 mmol/L (ref 20–29)
Calcium: 9.9 mg/dL (ref 8.7–10.3)
Chloride: 104 mmol/L (ref 96–106)
Creatinine, Ser: 0.98 mg/dL (ref 0.57–1.00)
Glucose: 95 mg/dL (ref 70–99)
Potassium: 4.5 mmol/L (ref 3.5–5.2)
Sodium: 142 mmol/L (ref 134–144)
eGFR: 57 mL/min/{1.73_m2} — ABNORMAL LOW (ref >59)

## 2023-07-09 DIAGNOSIS — H11423 Conjunctival edema, bilateral: Secondary | ICD-10-CM | POA: Diagnosis not present

## 2023-07-09 DIAGNOSIS — H0014 Chalazion left upper eyelid: Secondary | ICD-10-CM | POA: Diagnosis not present

## 2023-07-12 ENCOUNTER — Telehealth: Payer: Self-pay | Admitting: Family Medicine

## 2023-07-12 DIAGNOSIS — G8929 Other chronic pain: Secondary | ICD-10-CM

## 2023-07-12 DIAGNOSIS — M25511 Pain in right shoulder: Secondary | ICD-10-CM

## 2023-07-12 NOTE — Telephone Encounter (Signed)
Patient called back in response to the PT order.  We had originally sent it to [facility] but she asked that it be changed to PT at [facility].  This was sent on 07/02/23.  Patient called stating that she felt there was some confusion on what the PT was needed for (originally sent as neck and right arm). She said that her back seems to be the main issue at this time, she is also having trouble stepping up and still some weakness in her right arm. She would like to work on strength there the most.   Can this be added to the referral and resent?  Please advise.

## 2023-07-16 DIAGNOSIS — H11821 Conjunctivochalasis, right eye: Secondary | ICD-10-CM | POA: Diagnosis not present

## 2023-07-16 NOTE — Telephone Encounter (Signed)
New referral placed to [facility] for low back, left hip, and right arm.

## 2023-07-21 ENCOUNTER — Other Ambulatory Visit: Payer: Self-pay | Admitting: Neurology

## 2023-07-23 DIAGNOSIS — M25552 Pain in left hip: Secondary | ICD-10-CM | POA: Diagnosis not present

## 2023-07-23 DIAGNOSIS — M47817 Spondylosis without myelopathy or radiculopathy, lumbosacral region: Secondary | ICD-10-CM | POA: Diagnosis not present

## 2023-07-23 DIAGNOSIS — M48061 Spinal stenosis, lumbar region without neurogenic claudication: Secondary | ICD-10-CM | POA: Diagnosis not present

## 2023-07-23 DIAGNOSIS — M5125 Other intervertebral disc displacement, thoracolumbar region: Secondary | ICD-10-CM | POA: Diagnosis not present

## 2023-07-23 DIAGNOSIS — M47816 Spondylosis without myelopathy or radiculopathy, lumbar region: Secondary | ICD-10-CM | POA: Diagnosis not present

## 2023-07-25 DIAGNOSIS — M810 Age-related osteoporosis without current pathological fracture: Secondary | ICD-10-CM | POA: Diagnosis not present

## 2023-07-25 DIAGNOSIS — I10 Essential (primary) hypertension: Secondary | ICD-10-CM | POA: Diagnosis not present

## 2023-07-29 DIAGNOSIS — M353 Polymyalgia rheumatica: Secondary | ICD-10-CM | POA: Diagnosis not present

## 2023-07-29 DIAGNOSIS — D692 Other nonthrombocytopenic purpura: Secondary | ICD-10-CM | POA: Diagnosis not present

## 2023-07-29 DIAGNOSIS — G629 Polyneuropathy, unspecified: Secondary | ICD-10-CM | POA: Diagnosis not present

## 2023-07-29 DIAGNOSIS — I700 Atherosclerosis of aorta: Secondary | ICD-10-CM | POA: Diagnosis not present

## 2023-07-29 DIAGNOSIS — N1831 Chronic kidney disease, stage 3a: Secondary | ICD-10-CM | POA: Diagnosis not present

## 2023-07-29 DIAGNOSIS — Z0000 Encounter for general adult medical examination without abnormal findings: Secondary | ICD-10-CM | POA: Diagnosis not present

## 2023-07-29 DIAGNOSIS — I10 Essential (primary) hypertension: Secondary | ICD-10-CM | POA: Diagnosis not present

## 2023-07-29 DIAGNOSIS — Z8639 Personal history of other endocrine, nutritional and metabolic disease: Secondary | ICD-10-CM | POA: Diagnosis not present

## 2023-07-29 DIAGNOSIS — M810 Age-related osteoporosis without current pathological fracture: Secondary | ICD-10-CM | POA: Diagnosis not present

## 2023-07-29 DIAGNOSIS — E890 Postprocedural hypothyroidism: Secondary | ICD-10-CM | POA: Diagnosis not present

## 2023-07-30 ENCOUNTER — Telehealth (HOSPITAL_BASED_OUTPATIENT_CLINIC_OR_DEPARTMENT_OTHER): Payer: Self-pay | Admitting: Cardiology

## 2023-07-30 DIAGNOSIS — I10 Essential (primary) hypertension: Secondary | ICD-10-CM

## 2023-07-30 DIAGNOSIS — Z5181 Encounter for therapeutic drug level monitoring: Secondary | ICD-10-CM

## 2023-07-30 MED ORDER — OLMESARTAN MEDOXOMIL 40 MG PO TABS: 40.0000 mg | ORAL_TABLET | Freq: Every day | ORAL | 3 refills | Status: DC

## 2023-07-30 NOTE — Telephone Encounter (Signed)
Advised patient, verbalized understanding  Will increase to 40 mg daily

## 2023-07-30 NOTE — Telephone Encounter (Signed)
05/2023 ACE changed to ARB. 06/24/23 nurse visit Spironolactone 25mg  daily initiated. Home BP cuff previously found to read 10 points low (manual 158/82 with home cuff 149/84). BP not at goal <130/80.   If checking prior to medications: continue current meds and check after medications. Report back readings in 1 week.  If checking after medications: Continue Spironolactone 25mg  daily. Increase Olmesartan to 40mg  daily. Repeat BMP in 1 week. (If hesitant to make med change could instead do 1.5 tabs or 30 mg of Olmesartan)  Alver Sorrow, NP

## 2023-07-30 NOTE — Telephone Encounter (Signed)
Pt c/o BP issue: STAT if pt c/o blurred vision, one-sided weakness or slurred speech  1. What are your last 5 BP readings?   144/77 144/90 139/92 133/78 138/88  2. Are you having any other symptoms (ex. Dizziness, headache, blurred vision, passed out)?   No  3. What is your BP issue?   Patient stated her BP readings have not been in good range since her medication change.  Patient stated her PCP suggested she increase her olmesartan (BENICAR) 20 MG tablet.

## 2023-08-06 DIAGNOSIS — Z5181 Encounter for therapeutic drug level monitoring: Secondary | ICD-10-CM | POA: Diagnosis not present

## 2023-08-06 DIAGNOSIS — I10 Essential (primary) hypertension: Secondary | ICD-10-CM | POA: Diagnosis not present

## 2023-08-07 LAB — BASIC METABOLIC PANEL
BUN/Creatinine Ratio: 18 (ref 12–28)
BUN: 17 mg/dL (ref 8–27)
CO2: 18 mmol/L — ABNORMAL LOW (ref 20–29)
Calcium: 9.5 mg/dL (ref 8.7–10.3)
Chloride: 94 mmol/L — ABNORMAL LOW (ref 96–106)
Creatinine, Ser: 0.93 mg/dL (ref 0.57–1.00)
Glucose: 101 mg/dL — ABNORMAL HIGH (ref 70–99)
Potassium: 4 mmol/L (ref 3.5–5.2)
Sodium: 130 mmol/L — ABNORMAL LOW (ref 134–144)
eGFR: 61 mL/min/{1.73_m2} (ref >59)

## 2023-08-08 ENCOUNTER — Telehealth (HOSPITAL_BASED_OUTPATIENT_CLINIC_OR_DEPARTMENT_OTHER): Payer: Self-pay

## 2023-08-08 DIAGNOSIS — M6281 Muscle weakness (generalized): Secondary | ICD-10-CM | POA: Diagnosis not present

## 2023-08-08 DIAGNOSIS — Z5181 Encounter for therapeutic drug level monitoring: Secondary | ICD-10-CM

## 2023-08-08 MED ORDER — SPIRONOLACTONE 25 MG PO TABS: 12.5000 mg | ORAL_TABLET | Freq: Every day | ORAL | Status: DC

## 2023-08-08 NOTE — Telephone Encounter (Signed)
-----   Message from Alver Sorrow sent at 08/07/2023  5:15 PM EST ----- Normal kidney function. Sodium, chloride, CO2 mildly low. Reduce Spironolactone to half tablet daily. Repeat BMP in 2 weeks for monitoring.

## 2023-08-08 NOTE — Telephone Encounter (Signed)
 Updated medication dose and repeat labs placed per APP. Results and recommendations seen by pt on MyChart. Labs mailed to pt.

## 2023-08-13 DIAGNOSIS — M6281 Muscle weakness (generalized): Secondary | ICD-10-CM | POA: Diagnosis not present

## 2023-08-14 DIAGNOSIS — M5416 Radiculopathy, lumbar region: Secondary | ICD-10-CM | POA: Diagnosis not present

## 2023-08-15 DIAGNOSIS — M6281 Muscle weakness (generalized): Secondary | ICD-10-CM | POA: Diagnosis not present

## 2023-08-19 DIAGNOSIS — M6281 Muscle weakness (generalized): Secondary | ICD-10-CM | POA: Diagnosis not present

## 2023-08-20 ENCOUNTER — Encounter: Payer: Self-pay | Admitting: Neurology

## 2023-08-26 ENCOUNTER — Telehealth: Payer: Self-pay | Admitting: Neurology

## 2023-08-26 ENCOUNTER — Ambulatory Visit: Payer: HMO | Admitting: Neurology

## 2023-08-26 ENCOUNTER — Ambulatory Visit (HOSPITAL_BASED_OUTPATIENT_CLINIC_OR_DEPARTMENT_OTHER): Payer: HMO | Admitting: Family

## 2023-08-26 NOTE — Telephone Encounter (Signed)
Pt cx appt due to being in pain. Will call back to r/s

## 2023-08-27 DIAGNOSIS — M5416 Radiculopathy, lumbar region: Secondary | ICD-10-CM | POA: Diagnosis not present

## 2023-08-27 DIAGNOSIS — M5116 Intervertebral disc disorders with radiculopathy, lumbar region: Secondary | ICD-10-CM | POA: Diagnosis not present

## 2023-08-27 DIAGNOSIS — M51362 Other intervertebral disc degeneration, lumbar region with discogenic back pain and lower extremity pain: Secondary | ICD-10-CM | POA: Diagnosis not present

## 2023-08-28 DIAGNOSIS — Z5181 Encounter for therapeutic drug level monitoring: Secondary | ICD-10-CM | POA: Diagnosis not present

## 2023-08-29 ENCOUNTER — Encounter (HOSPITAL_BASED_OUTPATIENT_CLINIC_OR_DEPARTMENT_OTHER): Payer: Self-pay

## 2023-08-29 DIAGNOSIS — D692 Other nonthrombocytopenic purpura: Secondary | ICD-10-CM | POA: Diagnosis not present

## 2023-08-29 DIAGNOSIS — Z85828 Personal history of other malignant neoplasm of skin: Secondary | ICD-10-CM | POA: Diagnosis not present

## 2023-08-29 DIAGNOSIS — B0089 Other herpesviral infection: Secondary | ICD-10-CM | POA: Diagnosis not present

## 2023-08-29 DIAGNOSIS — L718 Other rosacea: Secondary | ICD-10-CM | POA: Diagnosis not present

## 2023-08-29 LAB — BASIC METABOLIC PANEL
BUN/Creatinine Ratio: 18 (ref 12–28)
BUN: 16 mg/dL (ref 8–27)
CO2: 20 mmol/L (ref 20–29)
Calcium: 10.3 mg/dL (ref 8.7–10.3)
Chloride: 95 mmol/L — ABNORMAL LOW (ref 96–106)
Creatinine, Ser: 0.87 mg/dL (ref 0.57–1.00)
Glucose: 96 mg/dL (ref 70–99)
Potassium: 4 mmol/L (ref 3.5–5.2)
Sodium: 134 mmol/L (ref 134–144)
eGFR: 66 mL/min/{1.73_m2} (ref >59)

## 2023-08-30 NOTE — Telephone Encounter (Signed)
Pt scheduled for 1/30 at 9 am

## 2023-09-02 ENCOUNTER — Telehealth: Payer: Self-pay | Admitting: Neurology

## 2023-09-02 ENCOUNTER — Ambulatory Visit (HOSPITAL_BASED_OUTPATIENT_CLINIC_OR_DEPARTMENT_OTHER): Payer: HMO | Admitting: Cardiology

## 2023-09-02 ENCOUNTER — Encounter (HOSPITAL_BASED_OUTPATIENT_CLINIC_OR_DEPARTMENT_OTHER): Payer: Self-pay | Admitting: Cardiology

## 2023-09-02 VITALS — BP 140/70 | HR 88 | Ht 60.0 in | Wt 135.5 lb

## 2023-09-02 DIAGNOSIS — I340 Nonrheumatic mitral (valve) insufficiency: Secondary | ICD-10-CM | POA: Diagnosis not present

## 2023-09-02 DIAGNOSIS — E7800 Pure hypercholesterolemia, unspecified: Secondary | ICD-10-CM

## 2023-09-02 DIAGNOSIS — I341 Nonrheumatic mitral (valve) prolapse: Secondary | ICD-10-CM

## 2023-09-02 DIAGNOSIS — I700 Atherosclerosis of aorta: Secondary | ICD-10-CM

## 2023-09-02 DIAGNOSIS — I10 Essential (primary) hypertension: Secondary | ICD-10-CM

## 2023-09-02 MED ORDER — HYDRALAZINE HCL 50 MG PO TABS: 50.0000 mg | ORAL_TABLET | Freq: Three times a day (TID) | ORAL | Status: DC

## 2023-09-02 NOTE — Patient Instructions (Addendum)
Medication Instructions:  Your physician has recommended you make the following change in your medication:   START Hydralazine 50 mg THREE times a day and monitor your blood pressure. Goal is ~130 on the top number. If you have any issues or problems, call us and let us know.  Call us in a few weeks with blood pressure numbers and we will make more adjustments if needed.  *If you need a refill on your cardiac medications before your next appointment, please call your pharmacy*   Lab Work: Your physician recommends that you return for FASTING lab work: LIPIDS  If you have labs (blood work) drawn today and your tests are completely normal, you will receive your results only by: MyChart Message (if you have MyChart) OR A paper copy in the mail If you have any lab test that is abnormal or we need to change your treatment, we will call you to review the results.   Follow-Up: At [facility], you and your health needs are our priority.  As part of our continuing mission to provide you with exceptional heart care, we have created designated Provider Care Teams.  These Care Teams include your primary Cardiologist (physician) and Advanced Practice Providers (APPs -  Physician Assistants and Nurse Practitioners) who all work together to provide you with the care you need, when you need it.  We recommend signing up for the patient portal called "MyChart".  Sign up information is provided on this After Visit Summary.  MyChart is used to connect with patients for Virtual Visits (Telemedicine).  Patients are able to view lab/test results, encounter notes, upcoming appointments, etc.  Non-urgent messages can be sent to your provider as well.   To learn more about what you can do with MyChart, go to [email or web address].    Your next appointment:   2 month(s) with Gillian Shields, NP 6 month(s) with Dr. Cristal Deer  Other Instructions

## 2023-09-02 NOTE — Telephone Encounter (Signed)
Pt rescheduled appt.

## 2023-09-02 NOTE — Progress Notes (Signed)
Cardiology Office Note:  .    Date:  09/02/2023  ID:  [name], DOB 1941/02/24, MRN [id number] PCP: Merri Brunette, MD   [facility] Providers Cardiologist:  Jodelle Red, MD     History of Present Illness: .    [name] is a 83 y.o. 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 She is formerly a patient of Dr. Shari Prows, last seen by her 03/01/2022. She established care with me on 05/30/23.  Cardiac Hx: History of takotsubo CM in 2014 secondary to Graves disease. She was treated with RAI.  Echocardiogram in 2014 normal LVEF and severe pulmonary HTN with RVSP 63 mmHg. Repeat echo in 2016 showed improvement in PA pressure to normal 25 mmHg, moderate MVP and mild mitral regurgitation. Repeat TTE 04/2020 with LVEF 60-65%, mild LVH, mild MR, normal PASP, mild TR. Echo 03/2023 with moderate mitral valve prolapse.  History: Hypertension - Started on antihypertensives in her early 50's. 10 mg amlodipine causes swelling. Hyperlipidemia - on atorvastatin 20 mg daily.  Was orthostatic on visit 05/30/23, with hyponatremia. Hydrochlorothiazide stopped at that time.  Today: Feeling better, but has noticed that blood pressure has been higher at home than previously. Systolic has been 140s, systolic has been 70s. Repeat Na last week was normal at 134. We reviewed options for medication management extensively, see below.  Denies chest pain, shortness of breath at rest or with normal exertion. No PND, orthopnea, LE edema or unexpected weight gain. No syncope or palpitations. ROS otherwise negative except as noted.   ROS:  Please see the history of present illness. ROS otherwise  negative except as noted.   Studies Reviewed: Marland Kitchen         Physical Exam:    VS:  BP (!) 140/70   Pulse 88   Ht 5' (1.524 m)   Wt 135 lb 8 oz (61.5 kg)   SpO2 94%   BMI 26.46 kg/m    Wt Readings from Last 3 Encounters:  09/02/23 135 lb 8 oz (61.5 kg)  06/24/23 138 lb 14.4 oz (63 kg)  05/30/23 139 lb 6.4 oz (63.2 kg)    GEN: Well nourished, well developed in no acute distress HEENT: Normal, moist mucous membranes NECK: No JVD CARDIAC: regular rhythm, normal S1 and S2, no rubs or gallops. 1/6 systolic murmur. VASCULAR: Radial and DP pulses 2+ bilaterally. No carotid bruits RESPIRATORY:  Clear to auscultation without rales, wheezing or rhonchi  ABDOMEN: Soft, non-tender, non-distended MUSCULOSKELETAL:  Ambulates independently SKIN: Warm and dry, no edema NEUROLOGIC:  Alert and oriented x 3. No focal neuro deficits noted. PSYCHIATRIC:  Normal affect   ASSESSMENT AND PLAN: .    Hypertension, with orthostatic symptoms Hyponatremia -improved Na, no orthostatic symptoms recently -on amlodipine 5 mg daily, cannot tolerate higher dose due to swelling -on hydralazine 50 mg twice a day. Will change to 50 mg three times a day. -on olmesartan 40 mg daily (was on enalapril) -on spironolactone 12.5 mg daily (was 25 mg daily, decreased due to labs 08/06/23) -she will monitor BP and call with updated readings in a few weeks  MVP/MR -stable on echo  History of Graves disease, with takotsubo cardiomyopathy and atrial fibrillation as complication -no further recurrence of  cardiac issues  Aortic atherosclerosis Hyperlipidemia -continue atorvastatin -last lipids from [facility]: HDL 89, LDL 79, TG 56. While typically we want LDL >70, she has not been exercising. We discussed options. After shared decision making, she will work on lifestyle and recheck in 6 mos.  Dispo: 2 mos with Luther Parody and 6 mos with me (lipids prior to visit with me)  Signed, Jodelle Red, MD

## 2023-09-03 NOTE — Telephone Encounter (Signed)
I sent her a mychart message back. We are trying to work with her. She has been canceling her Botox appointments.

## 2023-09-03 NOTE — Telephone Encounter (Signed)
Pt called having pain in feet. Would like a earlier appt for Botox. Pt said will send a MyChart message

## 2023-09-05 ENCOUNTER — Ambulatory Visit: Payer: HMO | Admitting: Neurology

## 2023-09-10 ENCOUNTER — Ambulatory Visit (INDEPENDENT_AMBULATORY_CARE_PROVIDER_SITE_OTHER): Payer: HMO | Admitting: Neurology

## 2023-09-10 DIAGNOSIS — G6289 Other specified polyneuropathies: Secondary | ICD-10-CM | POA: Diagnosis not present

## 2023-09-10 MED ORDER — ONABOTULINUMTOXINA 200 UNITS IJ SOLR
300.0000 [IU] | Freq: Once | INTRAMUSCULAR | Status: AC
Start: 2023-09-10 — End: 2023-09-10
  Administered 2023-09-10: 300 [IU] via INTRAMUSCULAR

## 2023-09-10 NOTE — Progress Notes (Signed)
 09/10/2023: > 50% improvement with botox  on the bottom of the feet.   Discussed the following and sent a message to Dr. Colon to consider per his clinical judgement:   Complex regional pain syndrome? Exquisit pain, changes in color/puplish-red under the feet. Hard to fulfill the criteria of complex regional pain syndrome bc on the bottom of the feet but can we consider the following: lumbar sympathetic nerve block injection or a spinal cord stimulator. Discussed with patient will reach out to Dr. Colon and gave her literature to read about CRPS, Sympathetic nerve block, spinal cord stimulator, Qutenze patch   05/28/2023: Improved, continue using the same dose 300 units. Use numbing spray for patient, severe pain on injections.working significantly well, >>50% improvement in pain  02/26/2023: improved even further will use higher dose will USE 300 U today and spray with numbing cream!! Can put 300 units into 2 syringes. Right foot is the worst on the cushion of the bottom of each foot and the right one an area in the arch.    12/04/2022 used 200 nad will increase to 300U july  1.23.2024: working significantly well, >>50% improvement in pain. Unfortunately she leaves for [place] on April 15th, not sure we can rebotox before then (12 weeks have to pass)  10.26/2023: Significanty helping. Concentrate into 2 syringes. >> 60% improvement in pain  03/05/2022: Significanty helping. Concentrate into 2 syringes. >> 60% improvement in pain  In the balls of the feet and instep of the right foot (flexor digiotorum brevis, all lumbricals)  11/07/2021: After 3 months the severe pain returned, botox  is making a significant difference in pain > 60% improvement in pain. It was best to keep her feet on the chairs and dorsiflex them while I inject bc it hurts a lot. Left foot anterior pad of lower foot and right underneath the toes, on the right the anterior pad of lower foot and part of the instep medially. Keep feet on  chair and hold foot in dorsiflexion position tightly while injecting helped with the injection pain.  07/16/2021: unclear how much improvement. We will stop botox  for now and see if she reports increased pain when it wears off 04/11/2021: Botox  works great > 60% improvement in pain and quality of life, she can feel it wearing off. 01/04/2021: > 50% improvement in pain, continue current dose 10/04/2020: first injections  History: Refractory idiopathic peripheral diabetic polyneuropathy. 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 Hx of diabetic(per-diabetic) neuropathy.   Procedure: All procedures documented were medically necessary, reasonable and appropriate based on the patient's history, medical diagnosis and physician opinion. Verbal informed consent was obtained from the patient, patient was informed of potential risk of procedure, including bruising, bleeding, hematoma formation, infection, muscle weakness, muscle pain, numbness, transient hypertension, transient hyperglycemia and transient insomnia among others. All areas injected were topically clean with isopropyl rubbing alcohol. Nonsterile nonlatex gloves were worn during the procedure.   Injections were given bilaterally to the plantar surface of the feet.  The feet were properly sterilized with evenly spaced sites bilaterally injected each with 10 units of botox  using a 1 ml 30-gauge needle.  300 units of Botox  were used and none was wasted.  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

## 2023-09-10 NOTE — Patient Instructions (Addendum)
 Complex regional pain syndrome? Exquisit pain, change sin color/puprle and mottles and has a distinct border. Hard to fulfill the criteria of complex regional pain syndrome bc on the bottom of the feet but can we consider the following: lumbar sympathetic nerve block injection or a spinal cord stimulator which may help her low back pain but I'm told may help distal peripheral polyneuropathy?   Yes, a sympathetic nerve block can be used to treat foot pain, particularly when the pain is related to conditions like complex regional pain syndrome (CRPS) or other issues causing abnormal blood flow to the foot, leading to burning or tingling sensations; this is typically done through a lumbar sympathetic nerve block injection in the lower back which affects the nerves supplying the legs and feet.  Key points about using sympathetic nerve blocks for foot pain: Mechanism: Sympathetic nerves control blood vessel constriction, so blocking them can improve blood flow to the affected area, potentially reducing pain caused by poor circulation.  Conditions treated: This procedure is often considered for conditions like CRPS, Raynaud's syndrome, phantom limb pain, and severe burning pain in the foot due to nerve damage.  Injection site: A lumbar sympathetic nerve block is typically used for foot pain, where the injection is given near the lower spine to target the lumbar sympathetic nerve chain.   A spinal cord stimulator can be used as a treatment option for peripheral polyneuropathy, particularly when the pain associated with the condition is severe and doesn't respond well to other medications, offering a potential way to significantly reduce pain by delivering electrical impulses directly to the spinal cord, effectively blocking the pain signals from reaching the brain; this is especially considered for cases like diabetic peripheral neuropathy (DPN) which is a common form of peripheral polyneuropathy.  Key points  about spinal cord stimulation for peripheral polyneuropathy: Effectiveness: Studies have shown that spinal cord stimulation can provide substantial pain relief for many patients suffering from peripheral neuropathy, improving their quality of life significantly.  How it works: A small device is implanted under the skin, with thin wires (leads) placed near the spinal cord that deliver electrical pulses to disrupt the pain signals travelling to the brain.  Trial period: Before permanent implantation, most doctors will allow a trial period where the patient can test the device to assess its effectiveness in managing their pain    Also consider Qutenza: see literature  Complex regional pain syndrome: see literature

## 2023-09-10 NOTE — Progress Notes (Signed)
 Botox - 100 units x 3 vials Lot: [id number]        [id number]        [id number] Expiration: 11/04/2024        12/04/2025         01/04/2025 NDC: 9976-8854-98  Bacteriostatic 0.9% Sodium Chloride - 2 mL  Lot: [id number] Expiration: 06/06/2024 NDC: 9590-8033-97  Dx: G62.89 B/B Witnessed by Heather Boring, RN

## 2023-09-12 ENCOUNTER — Other Ambulatory Visit (HOSPITAL_BASED_OUTPATIENT_CLINIC_OR_DEPARTMENT_OTHER): Payer: Self-pay | Admitting: Family

## 2023-09-13 IMAGING — MG MM DIGITAL SCREENING BILAT W/ TOMO AND CAD
8 series · 9 of 24 positions shown · non-contrast
Comparison: Previous exam(s).

CLINICAL DATA: Screening.

EXAM:
DIGITAL SCREENING BILATERAL MAMMOGRAM WITH TOMOSYNTHESIS AND CAD
TECHNIQUE: Bilateral screening digital craniocaudal and mediolateral oblique
mammograms were obtained. Bilateral screening digital breast
tomosynthesis was performed. The images were evaluated with
computer-aided detection.

[L CC synth-2D]
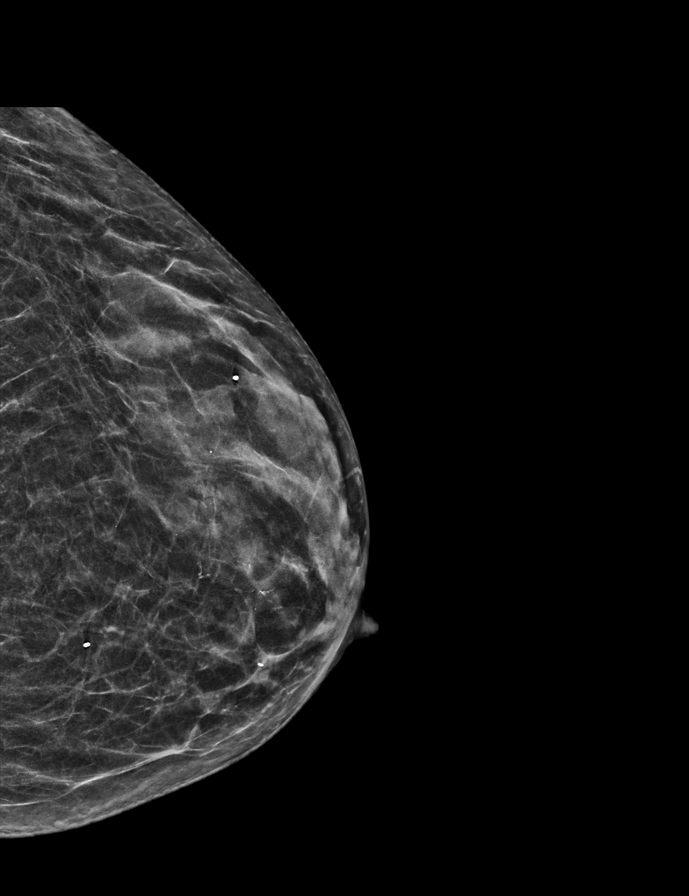

[L MLO synth-2D]
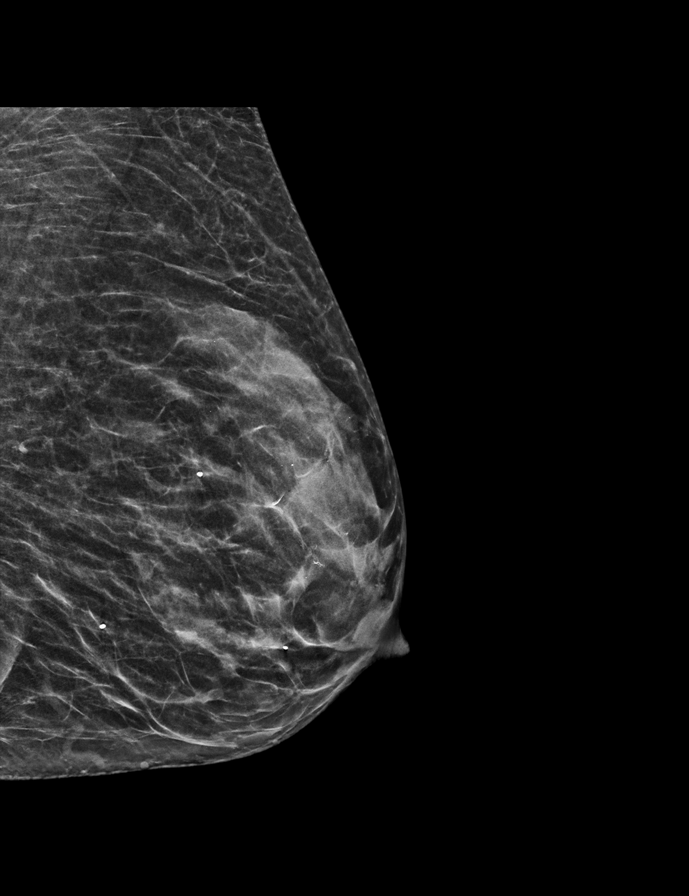

[R MLO synth-2D]
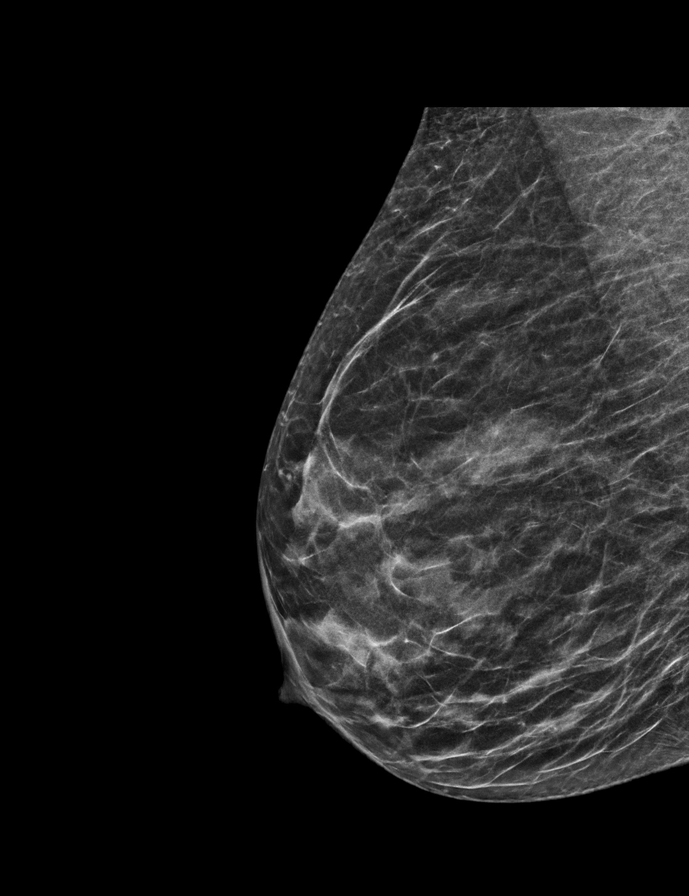

[R CC synth-2D]
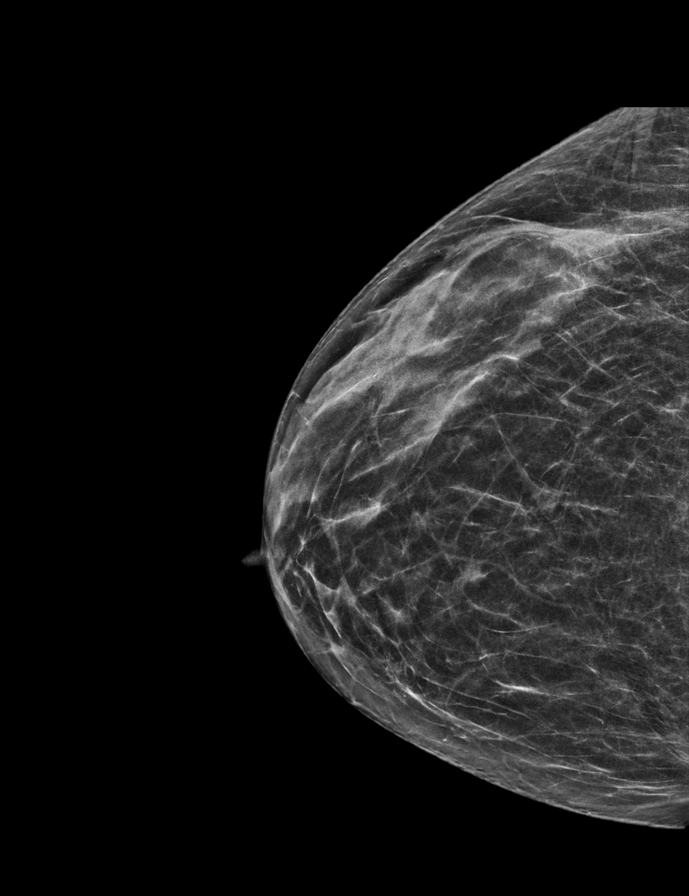

[L CC tomo · 2 of 47 frames shown]
[frame 16/47]
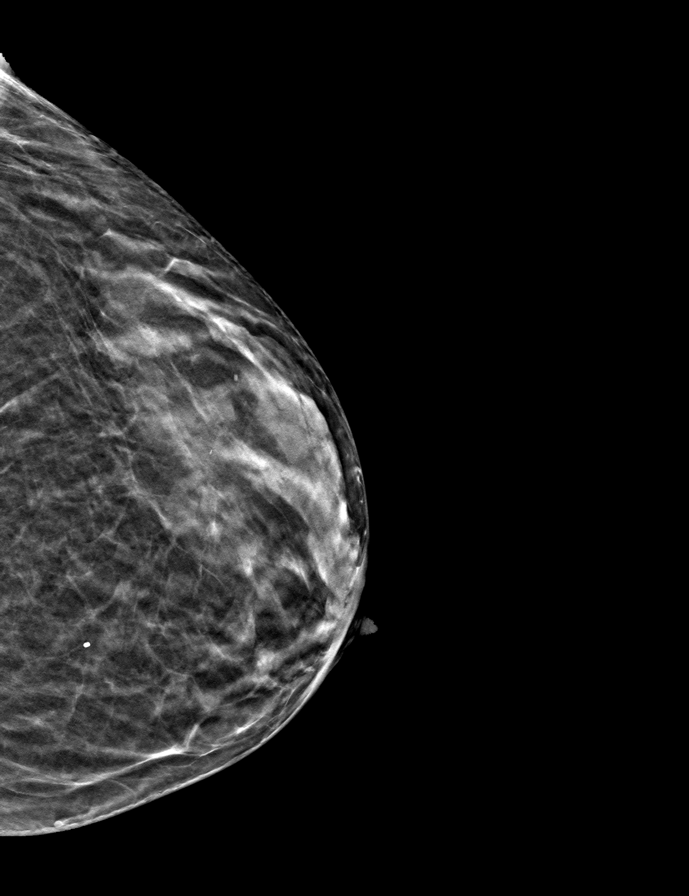
[frame 24/47]
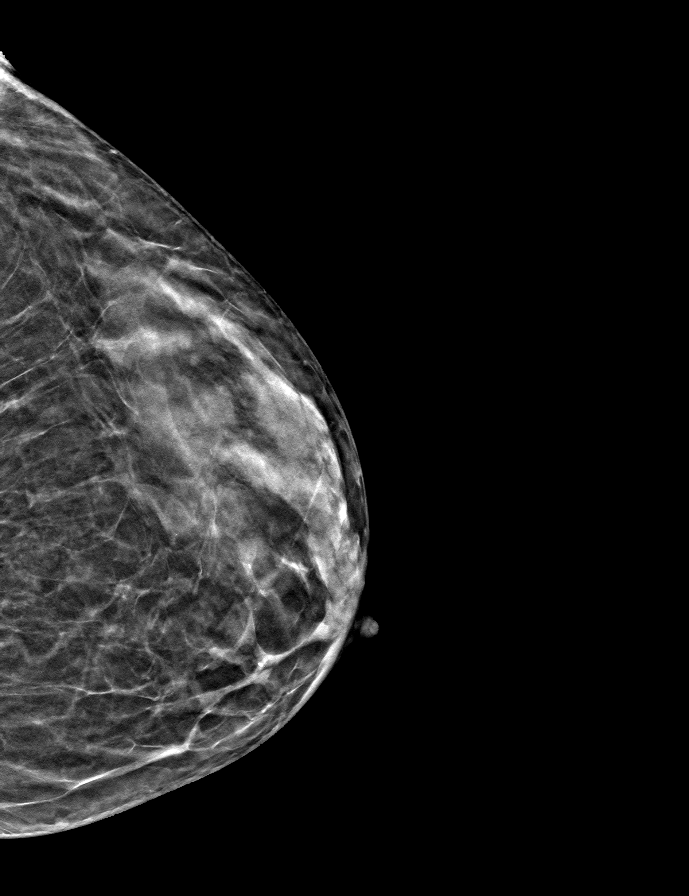

[R MLO tomo · tomo slice 23/45.0]
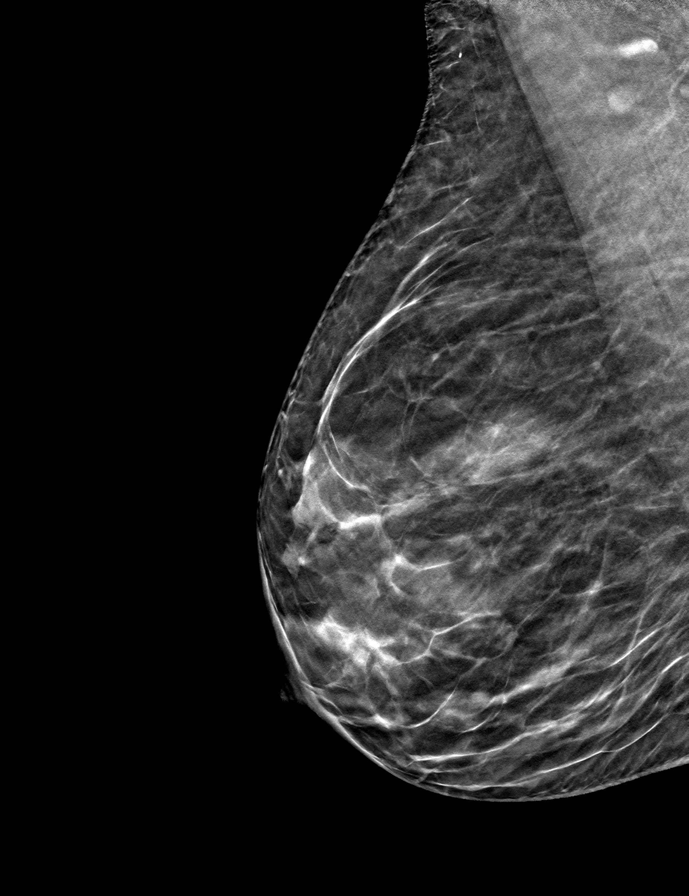

[L MLO tomo · tomo slice 23/46.0]
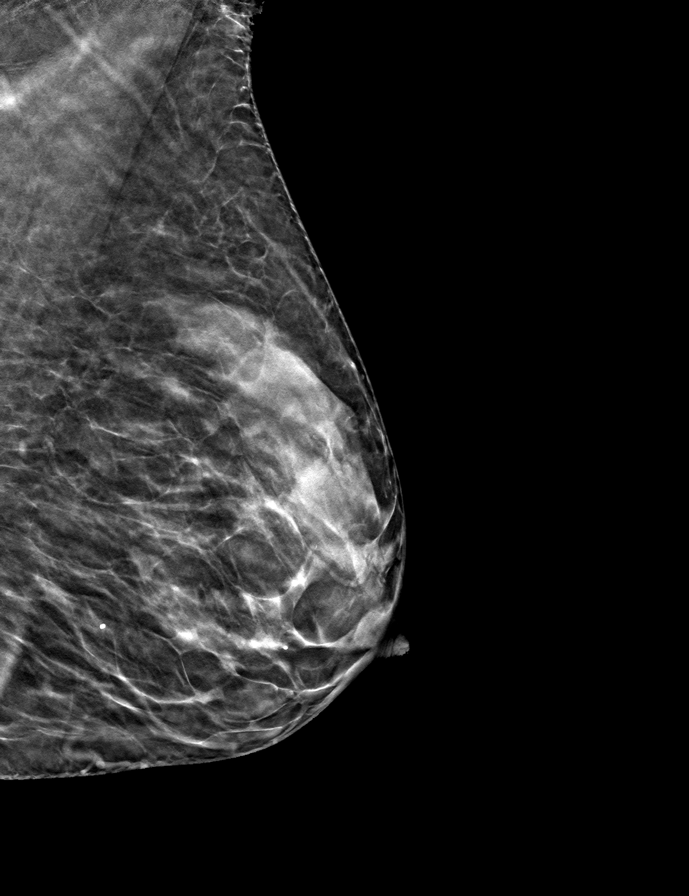

[R CC tomo · tomo slice 25/49.0]
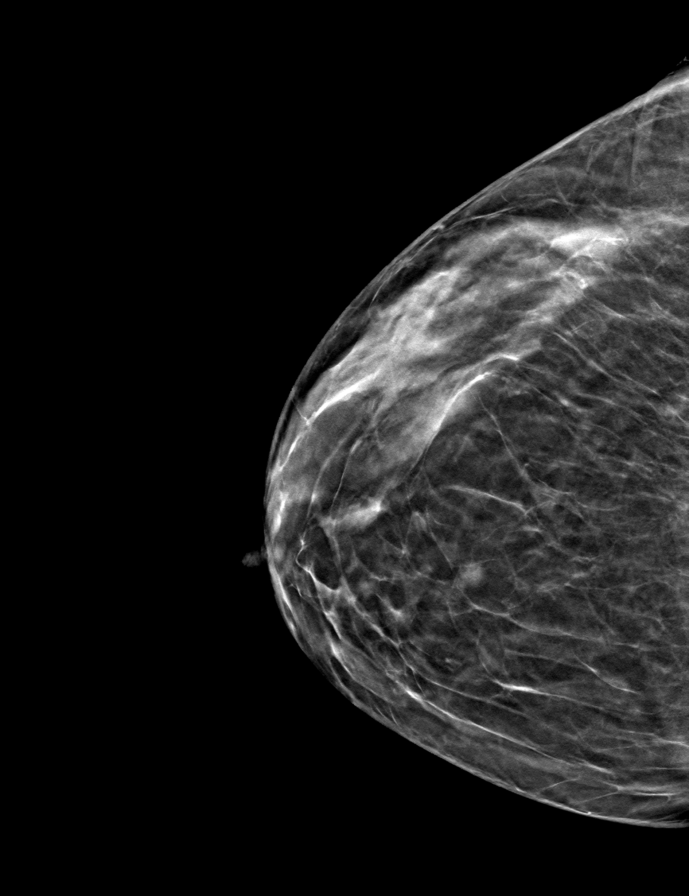

[9 of 24 positions shown; findings below may reference images not displayed]

ACR Breast Density Category c: The breast tissue is heterogeneously
dense, which may obscure small masses.
FINDINGS: There are no findings suspicious for malignancy.
IMPRESSION: No mammographic evidence of malignancy. A result letter of this
screening mammogram will be mailed directly to the patient.

RECOMMENDATION:
Screening mammogram in one year. (Code:[id number])

BI-RADS CATEGORY  1: Negative.

## 2023-09-16 DIAGNOSIS — H11822 Conjunctivochalasis, left eye: Secondary | ICD-10-CM | POA: Diagnosis not present

## 2023-09-26 ENCOUNTER — Telehealth (HOSPITAL_BASED_OUTPATIENT_CLINIC_OR_DEPARTMENT_OTHER): Payer: Self-pay | Admitting: Cardiology

## 2023-09-26 DIAGNOSIS — I10 Essential (primary) hypertension: Secondary | ICD-10-CM

## 2023-09-26 DIAGNOSIS — E7800 Pure hypercholesterolemia, unspecified: Secondary | ICD-10-CM

## 2023-09-26 NOTE — Telephone Encounter (Signed)
 New Message:      Patient is calling back with her blood pressure readings.  09-03-23-   149/76  09-05-23-   136/90   09-06-23-   135/82   09-07-23-      138/80    09-12-23-      156/76    09-13-23-       156/84     09-14-23-       146/79     09-17-23-      139/75     09-18-23        125/83     09-19-23-       132/81     09-23-23-        135/93      09-24-23-        149/92      09-25-23-         137/75       09-26-23-         154/94

## 2023-09-27 NOTE — Telephone Encounter (Signed)
 Average BP 142/83 not at goal <130/80. Recommend increasing Hydralazine. Take 100mg  AM, 50mg  afternoon, 100mg  at bedtime.  Alver Sorrow, NP

## 2023-09-30 DIAGNOSIS — M6281 Muscle weakness (generalized): Secondary | ICD-10-CM | POA: Diagnosis not present

## 2023-09-30 DIAGNOSIS — E785 Hyperlipidemia, unspecified: Secondary | ICD-10-CM | POA: Diagnosis not present

## 2023-09-30 DIAGNOSIS — I10 Essential (primary) hypertension: Secondary | ICD-10-CM | POA: Diagnosis not present

## 2023-09-30 MED ORDER — OLMESARTAN MEDOXOMIL 40 MG PO TABS: 40.0000 mg | ORAL_TABLET | Freq: Every day | ORAL | 3 refills | Status: AC

## 2023-09-30 MED ORDER — HYDRALAZINE HCL 50 MG PO TABS: 50.0000 mg | ORAL_TABLET | Freq: Three times a day (TID) | ORAL | Status: DC

## 2023-09-30 MED ORDER — SPIRONOLACTONE 25 MG PO TABS
12.5000 mg | ORAL_TABLET | Freq: Every day | ORAL | 3 refills | Status: DC
Start: 2023-09-30 — End: 2023-12-16

## 2023-09-30 NOTE — Telephone Encounter (Signed)
 Called and spoke to pt. Discussed APP's recommendations and med changes. Refills sent to local pharmacy. She verbalized understanding.

## 2023-10-01 ENCOUNTER — Encounter (HOSPITAL_BASED_OUTPATIENT_CLINIC_OR_DEPARTMENT_OTHER): Payer: Self-pay

## 2023-10-01 DIAGNOSIS — H52203 Unspecified astigmatism, bilateral: Secondary | ICD-10-CM | POA: Diagnosis not present

## 2023-10-01 DIAGNOSIS — Z961 Presence of intraocular lens: Secondary | ICD-10-CM | POA: Diagnosis not present

## 2023-10-01 LAB — LIPID PANEL
Chol/HDL Ratio: 2 ratio (ref 0.0–4.4)
Cholesterol, Total: 159 mg/dL (ref 100–199)
HDL: 80 mg/dL (ref >39)
LDL Chol Calc (NIH): 65 mg/dL (ref 0–99)
Triglycerides: 76 mg/dL (ref 0–149)
VLDL Cholesterol Cal: 14 mg/dL (ref 5–40)

## 2023-10-03 DIAGNOSIS — M6281 Muscle weakness (generalized): Secondary | ICD-10-CM | POA: Diagnosis not present

## 2023-10-16 ENCOUNTER — Ambulatory Visit (HOSPITAL_BASED_OUTPATIENT_CLINIC_OR_DEPARTMENT_OTHER): Payer: HMO | Admitting: Cardiovascular Disease

## 2023-10-28 ENCOUNTER — Ambulatory Visit (HOSPITAL_BASED_OUTPATIENT_CLINIC_OR_DEPARTMENT_OTHER): Payer: HMO | Admitting: Family

## 2023-10-28 VITALS — BP 126/75 | HR 93 | Ht 60.0 in | Wt 136.6 lb

## 2023-10-28 DIAGNOSIS — M79631 Pain in right forearm: Secondary | ICD-10-CM | POA: Diagnosis not present

## 2023-10-28 DIAGNOSIS — I341 Nonrheumatic mitral (valve) prolapse: Secondary | ICD-10-CM | POA: Diagnosis not present

## 2023-10-28 DIAGNOSIS — I10 Essential (primary) hypertension: Secondary | ICD-10-CM | POA: Diagnosis not present

## 2023-10-28 DIAGNOSIS — I5181 Takotsubo syndrome: Secondary | ICD-10-CM

## 2023-10-28 MED ORDER — HYDRALAZINE HCL 50 MG PO TABS: ORAL_TABLET | ORAL | 3 refills | Status: DC

## 2023-10-28 NOTE — Patient Instructions (Addendum)
 Medication Instructions:  Your physician recommends that you continue on your current medications as directed. Please refer to the Current Medication list given to you today.  Follow-Up: At [facility], you and your health needs are our priority.  As part of our continuing mission to provide you with exceptional heart care, we have created designated Provider Care Teams.  These Care Teams include your primary Cardiologist (physician) and Advanced Practice Providers (APPs -  Physician Assistants and Nurse Practitioners) who all work together to provide you with the care you need, when you need it.  We recommend signing up for the patient portal called "MyChart".  Sign up information is provided on this After Visit Summary.  MyChart is used to connect with patients for Virtual Visits (Telemedicine).  Patients are able to view lab/test results, encounter notes, upcoming appointments, etc.  Non-urgent messages can be sent to your provider as well.   To learn more about what you can do with MyChart, go to [email or web address].    Your next appointment:   July with Dr. Cristal Deer   Other Instructions Your average BP at home was: 126/75

## 2023-10-28 NOTE — Progress Notes (Signed)
 Cardiology Office Note:  .   Date:  11/03/2023  ID:  [name], DOB 09/03/1940, MRN [id number] PCP: Merri Brunette, MD  [facility] Providers Cardiologist:  Jodelle Red, MD    History of Present Illness: .   [name] is a 83 y.o. 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 Echo 2014 normal LVEF, severe pulmonary hypertension (RVSP ). Repeat echo improvement in PA pressure to normal 25 mmHg, moderate MVP, mild MR. Echo 04/2020 LVEF 60-65%, mild LVH, mild MR, mild MR, normal PASP, mild TR.  TTE 03/2023 normal LVEF, mild to moderate MR, moderate MVP which was stable from previous.   Presents today for follow-up independently.  Since last seen her dose of hydralazine has been increased.  Has been having more issues with her back pain and MRI did reveal a bone spur.  This is understandably may have exercising more difficult.  She attributes some of her elevated BP readings to pain.  Brings detailed BP log into clinic today with average BP based on these readings  126/75  ROS: Please see the history of present illness.    All other systems reviewed and are negative.   Studies Reviewed: .        Cardiac Studies & Procedures   ______________________________________________________________________________________________     ECHOCARDIOGRAM  ECHOCARDIOGRAM COMPLETE 04/02/2023  Narrative ECHOCARDIOGRAM REPORT    Patient Name:   [name] [facility] Date of Exam: 04/02/2023 Medical Rec #:  [id number]          Height:       64.0 in Accession #:    [id number]         Weight:       146.8 lb Date of Birth:  [birth year]         BSA:          1.715 m Patient Age:    47 years           BP:           147/85 mmHg Patient Gender: F                  HR:           69 bpm. Exam Location:   [address]  Procedure: 2D Echo, 3D Echo, Cardiac Doppler and Color Doppler  Indications:    I34.1 MVP  History:        Patient has prior history of Echocardiogram examinations, most recent 04/27/2023. Abnormal ECG, Carotid Disease, Pulmonary HTN and CKD, Arrythmias:Atrial Fibrillation and Bradycardia, Signs/Symptoms:Syncope; Risk Factors:Hypertension and Dyslipidemia.  Sonographer:    Clearence Ped RCS Referring Phys: [id number] HEATHER E [place]  IMPRESSIONS   1. Left ventricular ejection fraction, by estimation, is 60 to 65%. The left ventricle has normal function. The left ventricle has no regional wall motion abnormalities. Left ventricular diastolic parameters are indeterminate. 2. Right ventricular systolic function is normal. The right ventricular size is normal. There is normal pulmonary artery systolic pressure. 3. The mitral valve is abnormal. Mild to moderate mitral valve regurgitation. No evidence of mitral stenosis. 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 4. The aortic valve is tricuspid. Aortic valve regurgitation is not visualized. No aortic stenosis is present. 5. The inferior vena cava is normal in size with greater than 50%  respiratory variability, suggesting right atrial pressure of 3 mmHg.  Comparison(s): Prior images reviewed side by side. Moderate mitral valve prolapse better visualized on current study.  FINDINGS Left Ventricle: Left ventricular ejection fraction, by estimation, is 60 to 65%. The left ventricle has normal function. The left ventricle has no regional wall motion abnormalities. The left ventricular internal cavity size was normal in size. There is no left ventricular hypertrophy. Left ventricular diastolic parameters are indeterminate.  Right Ventricle: The right ventricular size is normal. Right vetricular wall thickness was not well visualized. Right ventricular systolic  function is normal. There is normal pulmonary artery systolic pressure. The tricuspid regurgitant velocity is 2.49 m/s, and with an assumed right atrial pressure of 3 mmHg, the estimated right ventricular systolic pressure is 27.8 mmHg.  Left Atrium: Left atrial size was normal in size.  Right Atrium: Right atrial size was normal in size.  Pericardium: Trivial pericardial effusion is present.  Mitral Valve: The mitral valve is abnormal. 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 Mild to moderate mitral valve regurgitation. No evidence of mitral valve stenosis.  Tricuspid Valve: The tricuspid valve is normal in structure. Tricuspid valve regurgitation is mild . No evidence of tricuspid stenosis.  Aortic Valve: The aortic valve is tricuspid. Aortic valve regurgitation is not visualized. No aortic stenosis is present.  Pulmonic Valve: The pulmonic valve was grossly normal. Pulmonic valve regurgitation is trivial. No evidence of pulmonic stenosis.  Aorta: The aortic root, ascending aorta, aortic arch and descending aorta are all structurally normal, with no evidence of dilitation or obstruction.  Venous: The inferior vena cava is normal in size with greater than 50% respiratory variability, suggesting right atrial pressure of 3 mmHg.  IAS/Shunts: The atrial septum is grossly normal.   LEFT VENTRICLE PLAX 2D LVIDd:         4.00 cm   Diastology LVIDs:         2.50 cm   LV e' medial:    7.51 cm/s LV PW:         1.30 cm   LV E/e' medial:  8.1 LV IVS:        1.00 cm   LV e' lateral:   4.03 cm/s LVOT diam:     1.70 cm   LV E/e' lateral: 15.1 LV SV:         54 LV SV Index:   31 LVOT Area:     2.27 cm  3D Volume EF: 3D EF:        50 % LV EDV:       83 ml LV ESV:       41 ml LV SV:        42 ml  RIGHT VENTRICLE RV Basal diam:  3.80 cm RV S prime:     11.50 cm/s TAPSE (M-mode): 2.7 cm RVSP:           27.8 mmHg  LEFT  ATRIUM             Index        RIGHT ATRIUM           Index LA diam:        3.40 cm 1.98 cm/m   RA Pressure: 3.00 mmHg LA Vol (A2C):   40.9 ml 23.84 ml/m  RA Area:     11.50 cm LA Vol (A4C):   42.1 ml 24.54 ml/m  RA Volume:  25.10 ml  14.63 ml/m LA Biplane Vol: 42.9 ml 25.01 ml/m AORTIC VALVE LVOT Vmax:   117.00 cm/s LVOT Vmean:  84.100 cm/s LVOT VTI:    0.236 m  AORTA Ao Root diam: 3.10 cm Ao Asc diam:  3.60 cm  MITRAL VALVE                TRICUSPID VALVE MV Area (PHT):              TR Peak grad:   24.8 mmHg MV Decel Time:              TR Vmax:        249.00 cm/s MV E velocity: 60.80 cm/s   Estimated RAP:  3.00 mmHg MV A velocity: 101.00 cm/s  RVSP:           27.8 mmHg MV E/A ratio:  0.60 SHUNTS Systemic VTI:  0.24 m Systemic Diam: 1.70 cm  Jodelle Red MD Electronically signed by Jodelle Red MD Signature Date/Time: 04/02/2023/5:56:18 PM    Final          ______________________________________________________________________________________________      Risk Assessment/Calculations:           Physical Exam:   VS:  BP 126/75 Comment: average home BP  Pulse 93   Ht 5' (1.524 m)   Wt 136 lb 9.6 oz (62 kg)   SpO2 97%   BMI 26.68 kg/m    Wt Readings from Last 3 Encounters:  10/28/23 136 lb 9.6 oz (62 kg)  09/02/23 135 lb 8 oz (61.5 kg)  06/24/23 138 lb 14.4 oz (63 kg)    Vitals:   10/28/23 1435 10/28/23 1500  BP: (!) 142/70 126/75 Comment: average home BP  Pulse: 93   Height: 5' (1.524 m)   Weight: 136 lb 9.6 oz (62 kg)   SpO2: 97%   BMI (Calculated): 26.68     GEN: Well nourished, well developed in no acute distress NECK: No JVD; No carotid bruits CARDIAC: RRR, no murmurs, rubs, gallops RESPIRATORY:  Clear to auscultation without rales, wheezing or rhonchi  ABDOMEN: Soft, non-tender, non-distended EXTREMITIES:  No edema; No deformity   ASSESSMENT AND PLAN: .    Prior Takotsubo cardiomyopathy 2014 with recovered  LVEF-previously occurred in setting of Graves' disease.  Catheterization at the time no significant CAD. No heart failure symptoms.   Isolated PAF -in the setting of Graves' disease and Takotsubo cardiomyopathy.  As no recurrence no indication for OAC.  If it did recur would require OAC.  Monitor with periodic EKG.  MVP / MR -echo 03/2023 normal LVEF, mild to moderate MR, moderate MVP.  Overall stable from previous.  Continue optimal BP control.  HLD/aortic atherosclerosis- Stable with no anginal symptoms. No indication for ischemic evaluation.  LDL goal <70. 07/2022 total cholesterol 157 glycerides 53, HDL 92, LDL 54.  Continue atorvastatin 20 mg daily.  HTN -mild elevated in clinic today in the setting of back pain with average home BP 126/75.  Continue present antihypertensive regimen olmesartan 40 daily, amlodipine 5 mg daily, hydralazine 100 mg a.m., 50 mg midday, 100 mg p.m. Discussed checking BP at least an hour after medications, sitting 5 to 10 minutes prior, feet flat on the floor.       Dispo: follow up in July with Dr. Cristal Deer Signed, Alver Sorrow, NP

## 2023-10-30 DIAGNOSIS — M5416 Radiculopathy, lumbar region: Secondary | ICD-10-CM | POA: Diagnosis not present

## 2023-10-31 ENCOUNTER — Ambulatory Visit: Payer: HMO | Admitting: Neurology

## 2023-11-01 ENCOUNTER — Telehealth: Payer: Self-pay | Admitting: Internal Medicine

## 2023-11-01 DIAGNOSIS — H0012 Chalazion right lower eyelid: Secondary | ICD-10-CM | POA: Diagnosis not present

## 2023-11-01 DIAGNOSIS — H5711 Ocular pain, right eye: Secondary | ICD-10-CM | POA: Diagnosis not present

## 2023-11-01 NOTE — Telephone Encounter (Signed)
 Pt stated that she is having issues with constipation. No other GI complaints. Last BM was several days ago. Pt was recommended to use Miralax twice a day and then once a day for a large BM.  Pt verbalized understanding with all questions answered.

## 2023-11-01 NOTE — Telephone Encounter (Signed)
 PT is calling to speak with a nurse regarding her colitis. She said now she is experiencing severe constipation. Please advise of options for relief.

## 2023-11-03 ENCOUNTER — Encounter (HOSPITAL_BASED_OUTPATIENT_CLINIC_OR_DEPARTMENT_OTHER): Payer: Self-pay | Admitting: Family

## 2023-11-04 ENCOUNTER — Other Ambulatory Visit: Payer: Self-pay

## 2023-11-04 DIAGNOSIS — M810 Age-related osteoporosis without current pathological fracture: Secondary | ICD-10-CM | POA: Diagnosis not present

## 2023-11-04 DIAGNOSIS — E0500 Thyrotoxicosis with diffuse goiter without thyrotoxic crisis or storm: Secondary | ICD-10-CM | POA: Diagnosis not present

## 2023-11-04 DIAGNOSIS — H5340 Unspecified visual field defects: Secondary | ICD-10-CM | POA: Diagnosis not present

## 2023-11-04 MED ORDER — ATORVASTATIN CALCIUM 20 MG PO TABS: 20.0000 mg | ORAL_TABLET | Freq: Every day | ORAL | 3 refills | Status: AC

## 2023-11-08 ENCOUNTER — Encounter (HOSPITAL_BASED_OUTPATIENT_CLINIC_OR_DEPARTMENT_OTHER): Payer: Self-pay

## 2023-11-08 DIAGNOSIS — I10 Essential (primary) hypertension: Secondary | ICD-10-CM

## 2023-11-11 ENCOUNTER — Other Ambulatory Visit: Payer: Self-pay | Admitting: Neurology

## 2023-11-11 DIAGNOSIS — Z85828 Personal history of other malignant neoplasm of skin: Secondary | ICD-10-CM | POA: Diagnosis not present

## 2023-11-11 DIAGNOSIS — H0014 Chalazion left upper eyelid: Secondary | ICD-10-CM | POA: Diagnosis not present

## 2023-11-11 DIAGNOSIS — D692 Other nonthrombocytopenic purpura: Secondary | ICD-10-CM | POA: Diagnosis not present

## 2023-11-11 DIAGNOSIS — L718 Other rosacea: Secondary | ICD-10-CM | POA: Diagnosis not present

## 2023-11-11 DIAGNOSIS — L814 Other melanin hyperpigmentation: Secondary | ICD-10-CM | POA: Diagnosis not present

## 2023-11-11 DIAGNOSIS — L738 Other specified follicular disorders: Secondary | ICD-10-CM | POA: Diagnosis not present

## 2023-11-11 DIAGNOSIS — D1801 Hemangioma of skin and subcutaneous tissue: Secondary | ICD-10-CM | POA: Diagnosis not present

## 2023-11-11 DIAGNOSIS — L821 Other seborrheic keratosis: Secondary | ICD-10-CM | POA: Diagnosis not present

## 2023-11-11 MED ORDER — HYDRALAZINE HCL 50 MG PO TABS: ORAL_TABLET | ORAL | 3 refills | Status: DC

## 2023-11-13 ENCOUNTER — Other Ambulatory Visit: Payer: Self-pay | Admitting: Neurology

## 2023-11-21 DIAGNOSIS — M5115 Intervertebral disc disorders with radiculopathy, thoracolumbar region: Secondary | ICD-10-CM | POA: Diagnosis not present

## 2023-11-21 DIAGNOSIS — M5415 Radiculopathy, thoracolumbar region: Secondary | ICD-10-CM | POA: Diagnosis not present

## 2023-11-27 ENCOUNTER — Other Ambulatory Visit: Payer: Self-pay | Admitting: Obstetrics & Gynecology

## 2023-11-27 DIAGNOSIS — Z1231 Encounter for screening mammogram for malignant neoplasm of breast: Secondary | ICD-10-CM

## 2023-12-03 ENCOUNTER — Ambulatory Visit: Payer: HMO | Admitting: Neurology

## 2023-12-03 DIAGNOSIS — G6289 Other specified polyneuropathies: Secondary | ICD-10-CM | POA: Diagnosis not present

## 2023-12-03 MED ORDER — ONABOTULINUMTOXINA 100 UNITS IJ SOLR
300.0000 [IU] | Freq: Once | INTRAMUSCULAR | Status: AC
Start: 2023-12-03 — End: ?

## 2023-12-03 NOTE — Progress Notes (Signed)
 Botox - 100 units x 3 vial Lot: [id number] Expiration: 12/2025 NDC: 2130-8657-84  Bacteriostatic 0.9% Sodium Chloride - 3mL total Lot: [id number] Expiration: 06/06/2024 NDC: 2841-3244-01  Dx: G62.89 B/B  Witnessed By: Inocencio Mania, RN

## 2023-12-03 NOTE — Progress Notes (Signed)
 12/03/2023: stable  09/10/2023: > 50% improvement with botox  on the bottom of the feet.   Discussed the following and sent a message to Dr. Ellery Guthrie to consider per his clinical judgement:   Complex regional pain syndrome? Exquisit pain, changes in color/puplish-red under the feet. Hard to fulfill the criteria of complex regional pain syndrome bc on the bottom of the feet but can we consider the following: lumbar sympathetic nerve block injection or a spinal cord stimulator. Discussed with patient will reach out to Dr. Ellery Guthrie and gave her literature to read about CRPS, Sympathetic nerve block, spinal cord stimulator, Qutenza patch   05/28/2023: Improved, continue using the same dose 300 units. Use numbing spray for patient, severe pain on injections.working significantly well, >>50% improvement in pain  02/26/2023: improved even further will use higher dose will USE 300 U today and spray with numbing cream!! Can put 300 units into 2 syringes. Right foot is the worst on the cushion of the bottom of each foot and the right one an area in the arch.    12/04/2022 used 200 nad will increase to 300U july  1.23.2024: working significantly well, >>50% improvement in pain. Unfortunately she leaves for [place] on April 15th, not sure we can rebotox before then (12 weeks have to pass)  10.26/2023: Significanty helping. Concentrate into 2 syringes. >> 60% improvement in pain  03/05/2022: Significanty helping. Concentrate into 2 syringes. >> 60% improvement in pain  In the balls of the feet and instep of the right foot (flexor digiotorum brevis, all lumbricals)  11/07/2021: After 3 months the severe pain returned, botox  is making a significant difference in pain > 60% improvement in pain. It was best to keep her feet on the chairs and dorsiflex them while I inject bc it hurts a lot. Left foot anterior pad of lower foot and right underneath the toes, on the right the anterior pad of lower foot and part of the instep  medially. Keep feet on chair and hold foot in dorsiflexion position tightly while injecting helped with the injection pain.  07/16/2021: unclear how much improvement. We will stop botox  for now and see if she reports increased pain when it wears off 04/11/2021: Botox  works great > 60% improvement in pain and quality of life, she can feel it wearing off. 01/04/2021: > 50% improvement in pain, continue current dose 10/04/2020: first injections  History: Refractory idiopathic peripheral diabetic polyneuropathy. 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 Hx of diabetic(per-diabetic) neuropathy.   Procedure: All procedures documented were medically necessary, reasonable and appropriate based on the patient's history, medical diagnosis and physician opinion. Verbal informed consent was obtained from the patient, patient was informed of potential risk of procedure, including bruising, bleeding, hematoma formation, infection, muscle weakness, muscle pain, numbness, transient hypertension, transient hyperglycemia and transient insomnia among others. All areas injected were topically clean with isopropyl rubbing alcohol. Nonsterile nonlatex gloves were worn during the procedure.   Injections were given bilaterally to the plantar surface of the feet.  The feet were properly sterilized with evenly spaced sites bilaterally injected each with 10 units of botox  using a 1 ml 30-gauge needle.  300 units of Botox  were used and none was wasted.  Dx: G62.89 B/B 32440 CPT code and 10272 for additional limb

## 2023-12-12 ENCOUNTER — Encounter (HOSPITAL_COMMUNITY): Payer: Self-pay

## 2023-12-12 ENCOUNTER — Emergency Department (HOSPITAL_COMMUNITY)

## 2023-12-12 ENCOUNTER — Other Ambulatory Visit: Payer: Self-pay | Admitting: Neurology

## 2023-12-12 ENCOUNTER — Other Ambulatory Visit: Payer: Self-pay

## 2023-12-12 ENCOUNTER — Inpatient Hospital Stay (HOSPITAL_COMMUNITY)

## 2023-12-12 ENCOUNTER — Observation Stay (HOSPITAL_COMMUNITY)
Admission: EM | Admit: 2023-12-12 | Discharge: 2023-12-13 | Disposition: A | Discharge status: Home / Self Care | Attending: Internal Medicine | Admitting: Internal Medicine

## 2023-12-12 DIAGNOSIS — Z79899 Other long term (current) drug therapy: Secondary | ICD-10-CM | POA: Insufficient documentation

## 2023-12-12 DIAGNOSIS — Z8639 Personal history of other endocrine, nutritional and metabolic disease: Secondary | ICD-10-CM | POA: Insufficient documentation

## 2023-12-12 DIAGNOSIS — E7849 Other hyperlipidemia: Secondary | ICD-10-CM | POA: Diagnosis not present

## 2023-12-12 DIAGNOSIS — R9431 Abnormal electrocardiogram [ECG] [EKG]: Secondary | ICD-10-CM | POA: Diagnosis present

## 2023-12-12 DIAGNOSIS — I700 Atherosclerosis of aorta: Secondary | ICD-10-CM | POA: Diagnosis present

## 2023-12-12 DIAGNOSIS — I10 Essential (primary) hypertension: Secondary | ICD-10-CM | POA: Diagnosis not present

## 2023-12-12 DIAGNOSIS — R55 Syncope and collapse: Secondary | ICD-10-CM | POA: Diagnosis not present

## 2023-12-12 DIAGNOSIS — F1090 Alcohol use, unspecified, uncomplicated: Secondary | ICD-10-CM | POA: Insufficient documentation

## 2023-12-12 DIAGNOSIS — E039 Hypothyroidism, unspecified: Secondary | ICD-10-CM | POA: Diagnosis present

## 2023-12-12 DIAGNOSIS — I5181 Takotsubo syndrome: Secondary | ICD-10-CM

## 2023-12-12 DIAGNOSIS — Z952 Presence of prosthetic heart valve: Secondary | ICD-10-CM | POA: Diagnosis not present

## 2023-12-12 DIAGNOSIS — I4581 Long QT syndrome: Secondary | ICD-10-CM | POA: Diagnosis not present

## 2023-12-12 DIAGNOSIS — E038 Other specified hypothyroidism: Secondary | ICD-10-CM | POA: Diagnosis not present

## 2023-12-12 DIAGNOSIS — E785 Hyperlipidemia, unspecified: Secondary | ICD-10-CM | POA: Diagnosis present

## 2023-12-12 DIAGNOSIS — Z8679 Personal history of other diseases of the circulatory system: Secondary | ICD-10-CM | POA: Diagnosis not present

## 2023-12-12 DIAGNOSIS — G9050 Complex regional pain syndrome I, unspecified: Secondary | ICD-10-CM | POA: Diagnosis present

## 2023-12-12 DIAGNOSIS — R110 Nausea: Secondary | ICD-10-CM | POA: Diagnosis not present

## 2023-12-12 DIAGNOSIS — R42 Dizziness and giddiness: Secondary | ICD-10-CM | POA: Diagnosis not present

## 2023-12-12 DIAGNOSIS — I959 Hypotension, unspecified: Secondary | ICD-10-CM | POA: Diagnosis not present

## 2023-12-12 DIAGNOSIS — E871 Hypo-osmolality and hyponatremia: Principal | ICD-10-CM | POA: Diagnosis present

## 2023-12-12 LAB — URINALYSIS, W/ REFLEX TO CULTURE (INFECTION SUSPECTED)
Bacteria, UA: NONE SEEN
Bilirubin Urine: NEGATIVE
Glucose, UA: NEGATIVE mg/dL
Hgb urine dipstick: NEGATIVE
Ketones, ur: NEGATIVE mg/dL
Nitrite: NEGATIVE
Protein, ur: NEGATIVE mg/dL
Specific Gravity, Urine: 1.009 (ref 1.005–1.030)
pH: 7 (ref 5.0–8.0)

## 2023-12-12 LAB — CBC WITH DIFFERENTIAL/PLATELET
Abs Immature Granulocytes: 0.04 10*3/uL (ref 0.00–0.07)
Basophils Absolute: 0 10*3/uL (ref 0.0–0.1)
Basophils Relative: 0 %
Eosinophils Absolute: 0.1 10*3/uL (ref 0.0–0.5)
Eosinophils Relative: 1 %
HCT: 36 % (ref 36.0–46.0)
Hemoglobin: 11.4 g/dL — ABNORMAL LOW (ref 12.0–15.0)
Immature Granulocytes: 0 %
Lymphocytes Relative: 19 %
Lymphs Abs: 1.7 10*3/uL (ref 0.7–4.0)
MCH: 31.6 pg (ref 26.0–34.0)
MCHC: 31.7 g/dL (ref 30.0–36.0)
MCV: 99.7 fL (ref 80.0–100.0)
Monocytes Absolute: 0.8 10*3/uL (ref 0.1–1.0)
Monocytes Relative: 9 %
Neutro Abs: 6.4 10*3/uL (ref 1.7–7.7)
Neutrophils Relative %: 71 %
Platelets: 258 10*3/uL (ref 150–400)
RBC: 3.61 MIL/uL — ABNORMAL LOW (ref 3.87–5.11)
RDW: 12.5 % (ref 11.5–15.5)
WBC: 9 10*3/uL (ref 4.0–10.5)
nRBC: 0 % (ref 0.0–0.2)

## 2023-12-12 LAB — OSMOLALITY, URINE: Osmolality, Ur: 275 mosm/kg — ABNORMAL LOW (ref 300–900)

## 2023-12-12 LAB — TROPONIN I (HIGH SENSITIVITY)
Troponin I (High Sensitivity): 7 ng/L (ref <18)
Troponin I (High Sensitivity): 6 ng/L (ref <18)

## 2023-12-12 LAB — COMPREHENSIVE METABOLIC PANEL WITH GFR
ALT: 12 U/L (ref 0–44)
AST: 19 U/L (ref 15–41)
Albumin: 4 g/dL (ref 3.5–5.0)
Alkaline Phosphatase: 45 U/L (ref 38–126)
Anion gap: 10 (ref 5–15)
BUN: 17 mg/dL (ref 8–23)
CO2: 20 mmol/L — ABNORMAL LOW (ref 22–32)
Calcium: 8.7 mg/dL — ABNORMAL LOW (ref 8.9–10.3)
Chloride: 96 mmol/L — ABNORMAL LOW (ref 98–111)
Creatinine, Ser: 0.92 mg/dL (ref 0.44–1.00)
GFR, Estimated: >60 mL/min (ref >60)
Glucose, Bld: 101 mg/dL — ABNORMAL HIGH (ref 70–99)
Potassium: 3.7 mmol/L (ref 3.5–5.1)
Sodium: 126 mmol/L — ABNORMAL LOW (ref 135–145)
Total Bilirubin: 0.9 mg/dL (ref 0.0–1.2)
Total Protein: 6.2 g/dL — ABNORMAL LOW (ref 6.5–8.1)

## 2023-12-12 LAB — CBG MONITORING, ED: Glucose-Capillary: 100 mg/dL — ABNORMAL HIGH (ref 70–99)

## 2023-12-12 LAB — SODIUM, URINE, RANDOM: Sodium, Ur: 60 mmol/L

## 2023-12-12 MED ORDER — SODIUM CHLORIDE 0.9% FLUSH
3.0000 mL | Freq: Two times a day (BID) | INTRAVENOUS | Status: DC
  Administered 2023-12-13: 3 mL via INTRAVENOUS

## 2023-12-12 MED ORDER — SODIUM CHLORIDE 0.9 % IV SOLN: Freq: Once | INTRAVENOUS | Status: DC

## 2023-12-12 MED ORDER — ONDANSETRON HCL 4 MG/2ML IJ SOLN: 4.0000 mg | Freq: Four times a day (QID) | INTRAMUSCULAR | Status: DC | PRN

## 2023-12-12 MED ORDER — LABETALOL HCL 5 MG/ML IV SOLN
10.0000 mg | Freq: Once | INTRAVENOUS | Status: AC
  Administered 2023-12-12: 10 mg via INTRAVENOUS
  Filled 2023-12-12: qty 4

## 2023-12-12 MED ORDER — SODIUM CHLORIDE 0.9% FLUSH: 3.0000 mL | Freq: Two times a day (BID) | INTRAVENOUS | Status: DC

## 2023-12-12 MED ORDER — ATORVASTATIN CALCIUM 40 MG PO TABS
40.0000 mg | ORAL_TABLET | Freq: Every day | ORAL | Status: DC
  Administered 2023-12-13: 40 mg via ORAL
  Filled 2023-12-12: qty 1

## 2023-12-12 MED ORDER — HYDRALAZINE HCL 50 MG PO TABS
100.0000 mg | ORAL_TABLET | Freq: Three times a day (TID) | ORAL | Status: DC
  Administered 2023-12-12: 100 mg via ORAL
  Filled 2023-12-12: qty 2

## 2023-12-12 MED ORDER — SODIUM CHLORIDE 0.9 % IV SOLN: INTRAVENOUS | Status: DC

## 2023-12-12 MED ORDER — ACETAMINOPHEN 650 MG RE SUPP: 650.0000 mg | Freq: Four times a day (QID) | RECTAL | Status: DC | PRN

## 2023-12-12 MED ORDER — GABAPENTIN 300 MG PO CAPS
300.0000 mg | ORAL_CAPSULE | Freq: Three times a day (TID) | ORAL | Status: DC
  Administered 2023-12-13 (×2): 300 mg via ORAL
  Filled 2023-12-12 (×3): qty 1

## 2023-12-12 MED ORDER — SODIUM CHLORIDE 0.9 % IV SOLN: 250.0000 mL | INTRAVENOUS | Status: DC | PRN

## 2023-12-12 MED ORDER — AMLODIPINE BESYLATE 5 MG PO TABS: 5.0000 mg | ORAL_TABLET | Freq: Every day | ORAL | Status: DC

## 2023-12-12 MED ORDER — TRAMADOL HCL 50 MG PO TABS: 50.0000 mg | ORAL_TABLET | Freq: Four times a day (QID) | ORAL | Status: DC | PRN

## 2023-12-12 MED ORDER — LEVOTHYROXINE SODIUM 75 MCG PO TABS
75.0000 ug | ORAL_TABLET | Freq: Every day | ORAL | Status: DC
  Administered 2023-12-13: 75 ug via ORAL
  Filled 2023-12-12: qty 1

## 2023-12-12 MED ORDER — HYDRALAZINE HCL 25 MG PO TABS
100.0000 mg | ORAL_TABLET | Freq: Three times a day (TID) | ORAL | Status: DC
Start: 2023-12-13 — End: 2023-12-12

## 2023-12-12 MED ORDER — ONDANSETRON HCL 4 MG PO TABS: 4.0000 mg | ORAL_TABLET | Freq: Four times a day (QID) | ORAL | Status: DC | PRN

## 2023-12-12 MED ORDER — ACETAMINOPHEN 325 MG PO TABS
650.0000 mg | ORAL_TABLET | Freq: Four times a day (QID) | ORAL | Status: DC | PRN
  Administered 2023-12-13 (×2): 650 mg via ORAL
  Filled 2023-12-12 (×2): qty 2

## 2023-12-12 MED ORDER — SODIUM CHLORIDE 0.9% FLUSH: 3.0000 mL | INTRAVENOUS | Status: DC | PRN

## 2023-12-12 NOTE — ED Provider Notes (Signed)
 [place] EMERGENCY DEPARTMENT AT [facility] Provider Note   CSN: [id number] Arrival date & time: 12/12/23  1642     History  Chief Complaint  Patient presents with   Near Syncope    [name] is a 83 y.o. female.  HPI     83 year old female with a history of Takotsubo's cardiomyopathy in 2014 due to Graves' disease, MVP, MR, hypertension, hyperlipidemia, aortic atherosclerosis, atrial fibrillation which was isolated in the setting of Graves' disease and Takotsubo's with no recurrence or indication for oral anticoagulation per cardiology, hyperlipidemia, hypertension who presents with concern for syncopal episode.  Has been feeling sick for a few weeks, back pain, stomach problems have both been worse over the last few weeks, just has not felt right.  No known fevers, cough, pain with urination, nausea, vomiting, diarrhea, black or bloody stools. No abdominal pain. Constipation now. No chest pain or dyspnea.   Pushed self to go into the store and got a cart and that is as far as she made it. Felt sick, lightheaded today, has been feeling lightheaded and it was worse trying to do things.  Leaned over the cart and then If passed out it was just for a second. Found self on the ground.  Does not think she was conscious when she fell.   No chest pain or dyspnea. Denies numbness, weakness, difficulty talking visual changes or facial droop.  Has not had difficulty walking prior to this, not walked since the episode coming in via EMS. NO injuries from her fall and denies head trauma or headache.  Does feel some sensation of pressure feels like it is her blood pressure.   When fell felt nauseated  BP was low 84 at the time per pt    Have been working on bp, changing meds several months ago. Had previously stopped hydrochlorothiazide  because of hyponatremia.  Has been on spironolactone , olmesartan  and increasing hydralazine  and feels that blood pressures still have been  high  Past Medical History:  Diagnosis Date   Abnormal EKG    Atrial fibrillation (HCC)    Basal cell carcinoma of nose    removed w/MOHs   CAD (coronary artery disease)    a. 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 EF 30-35% at [facility] 06/2013;  b.  f/u Echo (06/18/13):  EF 60-65%, normal wall motion, Gr 1 DD, mild MVP of post leaflet, mod TR, PASP 63   Thyroid  disease    Tricuspid regurgitation    mild to moderate, echo, January, 2011, PA pressure 38 mm mercury    Home Medications Prior to Admission medications   Medication Sig Start Date End Date Taking? Authorizing Provider  amLODipine  (NORVASC ) 5 MG tablet Take 1 tablet (5 mg total) by mouth daily. 04/10/23  Yes Clearnce Curia, NP  atorvastatin   (LIPITOR) 20 MG tablet Take 1 tablet (20 mg total) by mouth daily. 11/04/23  Yes Clearnce Curia, NP  Biotin  5000 MCG CAPS Take 1 capsule by mouth daily.    Yes [provider]  Calcium  Carbonate (CALTRATE 600 PO) Take 1 tablet by mouth 2 (two) times a day.    Yes [provider]  Capsaicin-Cleansing Gel (QUTENZA EX) Apply 1 patch topically every 3 (three) months.   Yes [provider]  Cholecalciferol  25 MCG (1000 UT) capsule Take 1,000 Units by mouth daily.   Yes [provider]  diclofenac sodium (VOLTAREN) 1 % GEL Apply 1 application  topically 4 (four) times daily as needed (pain). 04/16/18  Yes [provider]  gabapentin  (NEURONTIN ) 300 MG capsule TAKE 3 CAPSULES BY MOUTH 3 TIMES DAILY. Patient taking differently: Take 300 mg by mouth 3 (three) times daily. 11/14/23  Yes Glory Larsen, MD  hydrALAZINE  (APRESOLINE ) 50 MG tablet Take 100 mg in AM, 50 mg in the afternoon, and 100 mg at bedtime. Patient taking differently: Take 50-100 mg by mouth 3 (three) times daily. Take 100 mg in AM, 50 mg in the afternoon, and 100 mg at bedtime. 11/11/23  Yes Walker, Caitlin S, NP  ipratropium (ATROVENT) 0.06 % nasal spray Place 2 sprays into both nostrils 3 (three) times daily as needed for rhinitis. 10/09/23  Yes [provider]  levothyroxine  (SYNTHROID ) 75 MCG tablet Take 1 tablet (75 mcg total) by mouth daily. 06/04/23  Yes Emilie Harden, MD  meloxicam (MOBIC) 15 MG tablet Take 15 mg by mouth daily as needed for pain. 04/10/23  Yes [provider]  metroNIDAZOLE  (METROCREAM ) 0.75 % cream Apply 1 application topically 2 (two) times daily.   Yes [provider]  NONFORMULARY OR COMPOUNDED ITEM Apply 1-2 g topically in the morning, at noon, in the evening, and at bedtime. Compound cream: Meloxicam 0.5% Doxepin 3% Amantadine 3% Dextromethorphan 2% Lidocaine  2%  Sig: Apply 1-2 grams to the affected area 3-4 times daily. Dispense 240  grams with 5 refills. 11/12/22  Yes Glory Larsen, MD  olmesartan  (BENICAR ) 40 MG tablet Take 1 tablet (40 mg total) by mouth daily. 09/30/23  Yes Clearnce Curia, NP  pantoprazole  (PROTONIX ) 40 MG tablet Take 1 tablet by mouth daily as needed (heartburn). 11/23/15  Yes [provider]  spironolactone  (ALDACTONE ) 25 MG tablet Take 0.5 tablets (12.5 mg total) by mouth daily. 09/30/23  Yes Clearnce Curia, NP  traMADol  (ULTRAM ) 50 MG tablet Take 1 tablet by mouth every 6 (six) hours as needed for severe pain (pain score 7-10) or moderate pain (pain score 4-6). 10/22/23  Yes [provider]  traZODone (DESYREL) 50 MG  tablet Take 50 mg by mouth at bedtime as needed for sleep. 01/17/23  Yes [provider]  hydrochlorothiazide  (HYDRODIURIL ) 25 MG tablet Take 25 mg by mouth daily. Patient not taking: Reported on 12/13/2023 08/04/23   [provider]  metoprolol  succinate (TOPROL -XL) 25 MG 24 hr tablet Take 25 mg by mouth daily. Patient not taking: Reported on 12/13/2023 10/05/23   [provider]      Allergies    Patient has no known allergies.    Review of Systems   Review of Systems  Physical Exam Updated Vital Signs BP (!) 154/87 (BP Location: Right Arm)   Pulse 83   Temp 98.4 F (36.9 C) (Oral)   Resp 16   Ht 5\' 6"  (1.676 m)   Wt 60.6 kg   SpO2 96%   BMI 21.56 kg/m  Physical Exam Vitals and nursing note reviewed.  Constitutional:      General: She is not in acute distress.    Appearance: Normal appearance. She is well-developed. She is not ill-appearing or diaphoretic.  HENT:     Head: Normocephalic and atraumatic.  Eyes:     General: No visual field deficit.    Extraocular Movements: Extraocular movements intact.     Conjunctiva/sclera: Conjunctivae normal.     Pupils: Pupils are equal, round, and reactive to light.  Cardiovascular:     Rate and Rhythm: Normal rate and regular rhythm.     Pulses: Normal pulses.     Heart sounds:  Murmur heard.     No friction rub. No gallop.  Pulmonary:     Effort: Pulmonary effort is normal. No respiratory distress.     Breath sounds: Normal breath sounds. No wheezing or rales.  Abdominal:     General: There is no distension.     Palpations: Abdomen is soft.     Tenderness: There is no abdominal tenderness. There is no guarding.  Musculoskeletal:        General: No swelling or tenderness.     Cervical back: Normal range of motion.  Skin:    General: Skin is warm and dry.     Findings: No erythema or rash.  Neurological:     General: No focal deficit present.     Mental Status: She is alert and oriented to person, place, and time.     GCS: GCS eye subscore is 4. GCS verbal subscore is 5. GCS motor subscore is 6.     Cranial Nerves: No cranial nerve deficit, dysarthria or facial asymmetry.     Sensory: No sensory deficit.     Motor: No weakness or tremor.     Coordination: Coordination normal. Finger-Nose-Finger Test normal.     ED Results / Procedures / Treatments   Labs (all labs ordered are listed, but only abnormal results are displayed) Labs Reviewed  CBC WITH DIFFERENTIAL/PLATELET - Abnormal; Notable for the following components:      Result Value   RBC 3.61 (*)    Hemoglobin 11.4 (*)    All other components within normal limits  COMPREHENSIVE METABOLIC PANEL WITH GFR - Abnormal; Notable for the following components:   Sodium 126 (*)    Chloride 96 (*)    CO2 20 (*)    Glucose, Bld 101 (*)    Calcium  8.7 (*)    Total Protein 6.2 (*)    All other components within normal limits  URINALYSIS, W/ REFLEX TO CULTURE (INFECTION SUSPECTED) - Abnormal; Notable for the following components:  Color, Urine STRAW (*)    Leukocytes,Ua TRACE (*)    All other components within normal limits  OSMOLALITY, URINE - Abnormal; Notable for the following components:   Osmolality, Ur 275 (*)    All other components within normal limits  BASIC METABOLIC PANEL WITH GFR -  Abnormal; Notable for the following components:   Sodium 133 (*)    CO2 21 (*)    All other components within normal limits  COMPREHENSIVE METABOLIC PANEL WITH GFR - Abnormal; Notable for the following components:   Sodium 129 (*)    CO2 21 (*)    Glucose, Bld 156 (*)    Total Protein 6.3 (*)    All other components within normal limits  CBC - Abnormal; Notable for the following components:   RBC 3.44 (*)    Hemoglobin 11.0 (*)    HCT 35.0 (*)    MCV 101.7 (*)    All other components within normal limits  TSH - Abnormal; Notable for the following components:   TSH 5.233 (*)    All other components within normal limits  CBG MONITORING, ED - Abnormal; Notable for the following components:   Glucose-Capillary 100 (*)    All other components within normal limits  SODIUM, URINE, RANDOM  OSMOLALITY  BASIC METABOLIC PANEL WITH GFR  BASIC METABOLIC PANEL WITH GFR  BASIC METABOLIC PANEL WITH GFR  TROPONIN I (HIGH SENSITIVITY)  TROPONIN I (HIGH SENSITIVITY)    EKG EKG Interpretation Date/Time:  Thursday Dec 12 2023 16:55:44 EDT Ventricular Rate:  78 PR Interval:  196 QRS Duration:  108 QT Interval:  433 QTC Calculation: 494 R Axis:   205  Text Interpretation: Sinus rhythm suspected, unclear pw axis aVR, will repeat Probable left atrial enlargement Right axis deviation Abnormal T, consider ischemia, lateral leads New TW inversion lateral leads Confirmed by Scarlette Currier ([id number]) on 12/12/2023 5:15:49 PM  Radiology CT HEAD WO CONTRAST ( ) Result Date: 12/13/2023 CLINICAL DATA:  Recent syncopal episode EXAM: CT HEAD WITHOUT CONTRAST TECHNIQUE: Contiguous axial images were obtained from the base of the skull through the vertex without intravenous contrast. RADIATION DOSE REDUCTION: This exam was performed according to the departmental dose-optimization program which includes automated exposure control, adjustment of the mA and/or kV according to patient size and/or use of iterative  reconstruction technique. COMPARISON:  04/29/2023 FINDINGS: Brain: No evidence of acute infarction, hemorrhage, hydrocephalus, extra-axial collection or mass lesion/mass effect. Mild atrophic changes are noted. Vascular: No hyperdense vessel or unexpected calcification. Skull: Normal. Negative for fracture or focal lesion. Sinuses/Orbits: No acute finding. Other: None. IMPRESSION: No acute intracranial abnormality noted. Electronically Signed   By: Violeta Grey M.D.   On: 12/13/2023 01:06   DG Chest 2 View Result Date: 12/12/2023 CLINICAL DATA:  Syncope.  Lightheadedness EXAM: CHEST - 2 VIEW COMPARISON:  X-ray 06/19/2013 FINDINGS: No consolidation, pneumothorax or effusion. No edema. Normal cardiopericardial silhouette. Overlapping cardiac leads. Degenerative changes along the spine. IMPRESSION: No acute cardiopulmonary disease. Electronically Signed   By: Adrianna Horde M.D.   On: 12/12/2023 18:14    Procedures Procedures    Medications Ordered in ED Medications  0.9 %  sodium chloride  infusion (0 mLs Intravenous Stopped 12/12/23 2205)  traMADol  (ULTRAM ) tablet 50 mg (has no administration in time range)  atorvastatin  (LIPITOR) tablet 40 mg (40 mg Oral Given 12/13/23 0811)  levothyroxine  (SYNTHROID ) tablet 75 mcg (75 mcg Oral Given 12/13/23 0540)  gabapentin  (NEURONTIN ) capsule 300 mg (300 mg Oral Given 12/13/23 0812)  0.9 %  sodium chloride  infusion ( Intravenous Infusion Verify 12/13/23 0758)  sodium chloride  flush (NS) 0.9 % injection 3 mL (3 mLs Intravenous Not Given 12/13/23 0815)  sodium chloride  flush (NS) 0.9 % injection 3 mL (3 mLs Intravenous Not Given 12/13/23 0814)  sodium chloride  flush (NS) 0.9 % injection 3 mL (has no administration in time range)  0.9 %  sodium chloride  infusion (has no administration in time range)  acetaminophen  (TYLENOL ) tablet 650 mg (650 mg Oral Given 12/13/23 0811)    Or  acetaminophen  (TYLENOL ) suppository 650 mg ( Rectal See Alternative 12/13/23 0811)  ondansetron   (ZOFRAN ) tablet 4 mg (has no administration in time range)    Or  ondansetron  (ZOFRAN ) injection 4 mg (has no administration in time range)  hydrALAZINE  (APRESOLINE ) tablet 100 mg (100 mg Oral Given 12/13/23 0812)  hydrALAZINE  (APRESOLINE ) tablet 50 mg (has no administration in time range)  labetalol  (NORMODYNE ) injection 10 mg (10 mg Intravenous Given 12/12/23 2205)    ED Course/ Medical Decision Making/ A&P                                   83 year old female with a history of Takotsubo's cardiomyopathy in 2014 due to Graves' disease, MVP, MR, hypertension, hyperlipidemia, aortic atherosclerosis, atrial fibrillation which was isolated in the setting of Graves' disease and Takotsubo's with no recurrence or indication for oral anticoagulation per cardiology, hyperlipidemia, hypertension who presents with concern for syncopal episode preceding by lightheadedness worsening over the last few weeks.  Iniital BP 84 systolic per patient and EMS, improved prior to arrival.  DDx includes arrhythmia, anemia, dehydration,electrolyte abnormality, ACS, infection, valvular changes. No chest pain or dyspnea and have low clinical suspicion for PE or aortic dissection. Has had lightheadedness over last month, no history of headaches or recent head trauma, and have low clinical concern for ICH or acute CVA.  Did discuss possibility of head CT with her sensation of pressure but she declines, states this is the way she feels when her pressures are up and denies headache.  EKG completed and personally evaluated and interpreted by me appears to show normal rhythm, artifact.   CXR shows no clinically significant cardiac enlargement, pneumonia nor pneumothorax.  Labs obtained and personally evaluated and interpreted by me show mild anemia without clinically significant change to cause syncope, no AKI.  Sodium level is newly decreased to 126.  Suspect this may be secondary to medications and dehydration.  Suspect  symptoms of fatigue, lightheadedness, secondary to hyponatremia.      Will admit for symptomatic hyponatremia and syncope.  Ordered labs, saline infusion for hydration.          Final Clinical Impression(s) / ED Diagnoses Final diagnoses:  Hyponatremia  Syncope, unspecified syncope type    Rx / DC Orders ED Discharge Orders     None         Scarlette Currier, MD 12/13/23 1204

## 2023-12-12 NOTE — ED Triage Notes (Signed)
 Pt BIBA from near syncopal episode at grocery store.  Pt denies pain, headache, vision changes, and is unsure if she actually lost consciousness. Pt states she was grocery shopping then on sitting on the ground by the cart.

## 2023-12-12 NOTE — H&P (Addendum)
 History and Physical    [name]:[id number] DOB: 19-Feb-1941 DOA: 12/12/2023  PCP: Imelda Man, MD   Patient coming from: Home   Chief Complaint:  Chief Complaint  Patient presents with   Near Syncope   ED TRIAGE note:Pt BIBA from near syncopal episode at grocery store. Pt denies pain, headache, vision changes, and is unsure if she actually lost consciousness. Pt states she was grocery shopping then on sitting on the ground by the cart.   HPI:  [name] is a 83 y.o. female with medical history significant of hypothyroidism, chronic complex regional pain syndrome status post sympathetic nerve block with a spinal cord stimulator and qutenza patch and Botox ,Takotsubo cardiomyopathy 2014 due to Graves' disease, essential hypertension, hyperlipidemia, isolated paroxysmal atrial fibrillation, mitral valve regurgitation/mitral valve prolapse, hyperlipidemia, aortic atherosclerosis presented emergency department concern for possible syncopal episode. Patient reported she has been feeling sick for last few weeks worse with the back pain, stomach problem which has been getting worse over the last few weeks.  Denies any fever, chill, cough, chest pain, palpitation, UTI symptoms, nausea, vomiting, diarrhea, hemoptysis, hematemesis and melena.  Patient reported pushed self to go into the store and got a cart and that is as far as she made it. Felt sick, lightheaded today, has been feeling lightheaded and it was worse trying to do things.  Leaned over the cart and then If passed out it was just for a second. Found self on the ground.  Does not think she was conscious when she fell.   No chest pain or dyspnea. Denies numbness, weakness, difficulty talking visual changes or facial droop.  Has not had difficulty walking prior to this, not walked since the episode coming in via EMS.   Patient reported that due to previous episode of hyponatremia hydrochlorothiazide  has been discontinued  and patient being started on hydralazine  alongside with continuation of amlodipine , spironolactone  and olmesartan .   ED Course:  At presentation to ED patient found hypertensive blood pressure 183/81 and tachycardic 101 otherwise hemodynamically stable. CBC unremarkable stable H&H. CMP showing low sodium 126, low chloride 96, low bicarb 20 low calcium  8.7 and low protein 6.2. Troponin x 2 within normal range. Pending serum osmolarity, urine osmolarity and urine sodium. UA unremarkable except trace leukocyte esterase. Chest x-ray no acute cardiopulmonary process.  In the ED patient has been started on 0.9% NS 100 cc/h at 9:45 PM and given labetalol  10 mg.  EMS reported that when patient first found she was hypotensive systolic blood pressure was upper 80s range however currently patient is hypertensive while in the ED.  Hospitalist has been consulted for further evaluation management of hyponatremia and syncope.   Significant labs in the ED: Lab Orders         CBC with Differential         Comprehensive metabolic panel         Urinalysis, w/ Reflex to Culture (Infection Suspected) -Urine, Clean Catch         Sodium, urine, random         Osmolality, urine         Basic metabolic panel         Osmolality         Comprehensive metabolic panel         CBC         TSH         CBG monitoring, ED       Review of Systems:  Review of Systems  Constitutional:  Positive for malaise/fatigue. Negative for chills, fever and weight loss.  Respiratory:  Negative for cough.   Cardiovascular:  Negative for chest pain, palpitations, orthopnea and leg swelling.  Gastrointestinal:  Negative for abdominal pain, heartburn, nausea and vomiting.  Neurological:  Negative for dizziness, tingling, tremors, speech change, focal weakness, seizures, loss of consciousness, weakness and headaches.  Psychiatric/Behavioral:  The patient is not nervous/anxious.     Past Medical History:  Diagnosis Date    Abnormal EKG    Atrial fibrillation (HCC)    Basal cell carcinoma of nose    removed w/MOHs   CAD (coronary artery disease)    a. 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 EF 30-35% at [facility] 06/2013;  b.  f/u Echo (06/18/13):  EF 60-65%, normal wall motion, Gr 1 DD, mild MVP of post leaflet, mod TR, PASP 63   Thyroid  disease    Tricuspid regurgitation    mild to moderate, echo, January, 2011, PA pressure 38 mm mercury    Past Surgical History:  Procedure Laterality Date   CARDIAC CATHETERIZATION  06/17/2013   COLONOSCOPY  12/23/2003   normal (indications: prior adenomas and grandfather with colon cancer)   COLPOSCOPY     EXCISIONAL HEMORRHOIDECTOMY  2003   LEFT HEART CATHETERIZATION WITH CORONARY ANGIOGRAM  N/A 06/17/2013   Procedure: LEFT HEART CATHETERIZATION WITH CORONARY ANGIOGRAM;  Surgeon: Peter M Swaziland, MD;  Location: [facility] CATH LAB;  Service: Cardiovascular;  Laterality: N/A;   MOHS SURGERY Left 2011   "side of my nose" (06/17/2013)   TONSILLECTOMY     TOTAL KNEE ARTHROPLASTY  08/2010   TOTAL KNEE ARTHROPLASTY  02/25/2012   Procedure: TOTAL KNEE ARTHROPLASTY;  Surgeon: Aurther Blue, MD;  Location: [facility] ORS;  Service: Orthopedics;  Laterality: Right;     reports that she has never smoked. She has never used smokeless tobacco. She reports current alcohol use. She reports that she does not use drugs.  No Known Allergies  Family History  Problem Relation Age of  Onset   Heart attack Mother 27   Hypertension Mother    Heart disease Mother    Stroke Mother    Heart attack Father 20   Coronary artery disease Father        Multiple Family Members   Hypertension Father    Heart disease Father    Esophageal cancer Paternal Grandmother    Colon cancer Maternal Grandfather    Heart disease Maternal Grandfather    Irritable bowel syndrome Sister        More family members on father side of    Hypertension Sister    Heart disease Sister    Breast cancer Sister 5   Breast cancer Paternal Aunt        Age 49's   Heart disease Paternal Grandfather    Rectal cancer Neg Hx    Stomach cancer Neg Hx     Prior to Admission medications   Medication Sig Start Date End Date Taking? Authorizing Provider  amLODipine  (NORVASC ) 5 MG tablet Take 1 tablet (5 mg total) by mouth daily. 04/10/23   Clearnce Curia, NP  atorvastatin  (LIPITOR) 20 MG tablet Take 1 tablet (20 mg total) by mouth daily. Patient taking differently: Take 40 mg by mouth daily. 11/04/23   Clearnce Curia, NP  Biotin  5000 MCG CAPS Take 1 capsule by mouth daily.     [provider]  Calcium  Carbonate (CALTRATE 600 PO) Take 1 tablet by mouth 2 (two) times a day.     [provider]  Cholecalciferol  25 MCG (1000  UT) capsule Take 1,000 Units by mouth daily.    [provider]  denosumab  (PROLIA ) 60 MG/ML SOSY injection Inject 60 mg into the skin every 6 (six) months.    [provider]  diclofenac sodium (VOLTAREN) 1 % GEL 1 application. 04/16/18   [provider]  EVENING PRIMROSE OIL PO Take 1 capsule by mouth 2 (two) times a day.     [provider]  gabapentin  (NEURONTIN ) 300 MG capsule TAKE 3 CAPSULES BY MOUTH 3 TIMES DAILY. Patient taking differently: Take 300 mg by mouth 3 (three) times daily. 1 caps 3 times Daily 11/14/23   Glory Larsen, MD  hydrALAZINE  (APRESOLINE ) 50 MG tablet Take 100 mg in AM, 50 mg in the afternoon, and 100 mg at bedtime. 11/11/23   Walker, Caitlin S, NP  levothyroxine  (SYNTHROID ) 75 MCG tablet Take 1 tablet (75 mcg total) by mouth daily. 06/04/23   Emilie Harden, MD  meloxicam (MOBIC) 15 MG tablet Take 15 mg by mouth every other day. 04/10/23   [provider]  methylcellulose packet Take 1 each by mouth daily. CITRICEL    [provider]  metroNIDAZOLE  (METROCREAM ) 0.75 % cream Apply 1 application topically 2 (two) times daily.    [provider]  NONFORMULARY OR COMPOUNDED ITEM Apply 1-2 g topically in the morning, at noon, in the evening, and at bedtime. Compound cream: Meloxicam 0.5% Doxepin 3% Amantadine 3% Dextromethorphan 2% Lidocaine  2%  Sig: Apply 1-2 grams to the affected area 3-4 times daily. Dispense 240 grams with 5 refills. 11/12/22   Glory Larsen, MD  olmesartan  (BENICAR ) 40 MG tablet Take 1 tablet (40 mg total) by mouth daily. 09/30/23   Walker, Caitlin S, NP  pantoprazole  (PROTONIX ) 40 MG tablet Take 1 tablet by mouth daily as needed. 11/23/15   [provider]  spironolactone  (ALDACTONE ) 25 MG tablet Take 0.5 tablets (12.5 mg total) by mouth daily.  09/30/23   Walker, Caitlin S, NP  traMADol  (ULTRAM ) 50 MG tablet Take 1 tablet by mouth every 6 (six) hours as needed. 10/22/23    [provider]  traZODone (DESYREL) 50 MG tablet Take 25-100 mg by mouth at bedtime as needed. 01/17/23   [provider]  tretinoin (RETIN-A) 0.05 % cream Apply 1 application topically at bedtime.    [provider]     Physical Exam: Vitals:   12/12/23 1917 12/12/23 2030 12/12/23 2145 12/12/23 2249  BP: (!) 183/81 (!) 170/86 (!) 159/116 (!) 154/87  Pulse: 100 (!) 101 (!) 104 83  Resp: (!) 21  19 16   Temp: 97.7 F (36.5 C)   98.4 F (36.9 C)  TempSrc: Oral   Oral  SpO2: 96% 98% 97% 96%    Physical Exam Vitals and nursing note reviewed.  Constitutional:      General: She is not in acute distress.    Appearance: She is not ill-appearing or toxic-appearing.  Cardiovascular:     Rate and Rhythm: Normal rate and regular rhythm.     Pulses: Normal pulses.     Heart sounds: Normal heart sounds.  Pulmonary:     Effort: Pulmonary effort is normal.     Breath sounds: Normal breath sounds.  Abdominal:     General: Bowel sounds are normal.  Musculoskeletal:     Cervical back: Normal range of motion and neck supple.     Right lower leg: No edema.     Left lower leg: No edema.  Skin:    General: Skin is warm.     Capillary Refill: Capillary refill takes less than 2 seconds.  Neurological:     Mental Status: She is alert and oriented to person, place, and time.     Cranial Nerves: No cranial nerve deficit.     Sensory: No sensory deficit.     Motor: No weakness.     Coordination: Coordination normal.  Psychiatric:        Mood and Affect: Mood normal.        Behavior: Behavior normal.        Thought Content: Thought content normal.        Judgment: Judgment normal.     Labs on Admission: I have personally reviewed following labs and imaging studies  CBC: Recent Labs  Lab 12/12/23 1726 12/13/23 0028  WBC 9.0 8.3  NEUTROABS 6.4  --   HGB 11.4* 11.0*  HCT 36.0 35.0*  MCV 99.7 101.7*  PLT 258 250   Basic Metabolic Panel: Recent Labs  Lab  12/12/23 1726 12/13/23 0028  NA 126* 129*  K 3.7 3.8  CL 96* 98  CO2 20* 21*  GLUCOSE 101* 156*  BUN 17 16  CREATININE 0.92 0.67  CALCIUM  8.7* 9.3   GFR: CrCl cannot be calculated (Unknown ideal weight.). Liver Function Tests: Recent Labs  Lab 12/12/23 1726 12/13/23 0028  AST 19 20  ALT 12 13  ALKPHOS 45 47  BILITOT 0.9 0.7  PROT 6.2* 6.3*  ALBUMIN 4.0 3.9   No results for input(s): "LIPASE", "AMYLASE" in the last 168 hours. No results for input(s): "AMMONIA" in the last 168 hours. Coagulation Profile: No results for input(s): "INR", "PROTIME" in the last 168 hours. Cardiac Enzymes: Recent Labs  Lab 12/12/23 1726 12/12/23 1935  TROPONINIHS 7 6   BNP (last 3 results) No results for input(s): "BNP" in the last 8760 hours. HbA1C: No results for input(s): "HGBA1C" in the last  72 hours. CBG: Recent Labs  Lab 12/12/23 1702  GLUCAP 100*   Lipid Profile: No results for input(s): "CHOL", "HDL", "LDLCALC", "TRIG", "CHOLHDL", "LDLDIRECT" in the last 72 hours. Thyroid  Function Tests: Recent Labs    12/13/23 0023  TSH 5.233*   Anemia Panel: No results for input(s): "VITAMINB12", "FOLATE", "FERRITIN", "TIBC", "IRON", "RETICCTPCT" in the last 72 hours. Urine analysis:    Component Value Date/Time   COLORURINE STRAW (A) 12/12/2023 1953   APPEARANCEUR CLEAR 12/12/2023 1953   LABSPEC 1.009 12/12/2023 1953   PHURINE 7.0 12/12/2023 1953   GLUCOSEU NEGATIVE 12/12/2023 1953   HGBUR NEGATIVE 12/12/2023 1953   BILIRUBINUR NEGATIVE 12/12/2023 1953   BILIRUBINUR n 12/05/2012 1431   KETONESUR NEGATIVE 12/12/2023 1953   PROTEINUR NEGATIVE 12/12/2023 1953   UROBILINOGEN 0.2 07/29/2013 1345   NITRITE NEGATIVE 12/12/2023 1953   LEUKOCYTESUR TRACE (A) 12/12/2023 1953    Radiological Exams on Admission: I have personally reviewed images CT HEAD WO CONTRAST ( ) Result Date: 12/13/2023 CLINICAL DATA:  Recent syncopal episode EXAM: CT HEAD WITHOUT CONTRAST TECHNIQUE:  Contiguous axial images were obtained from the base of the skull through the vertex without intravenous contrast. RADIATION DOSE REDUCTION: This exam was performed according to the departmental dose-optimization program which includes automated exposure control, adjustment of the mA and/or kV according to patient size and/or use of iterative reconstruction technique. COMPARISON:  04/29/2023 FINDINGS: Brain: No evidence of acute infarction, hemorrhage, hydrocephalus, extra-axial collection or mass lesion/mass effect. Mild atrophic changes are noted. Vascular: No hyperdense vessel or unexpected calcification. Skull: Normal. Negative for fracture or focal lesion. Sinuses/Orbits: No acute finding. Other: None. IMPRESSION: No acute intracranial abnormality noted. Electronically Signed   By: Violeta Grey M.D.   On: 12/13/2023 01:06   DG Chest 2 View Result Date: 12/12/2023 CLINICAL DATA:  Syncope.  Lightheadedness EXAM: CHEST - 2 VIEW COMPARISON:  X-ray 06/19/2013 FINDINGS: No consolidation, pneumothorax or effusion. No edema. Normal cardiopericardial silhouette. Overlapping cardiac leads. Degenerative changes along the spine. IMPRESSION: No acute cardiopulmonary disease. Electronically Signed   By: Adrianna Horde M.D.   On: 12/12/2023 18:14     EKG: My personal interpretation of EKG shows: Normal sinus rhythm heart rate 78, left atrial enlargement, right axis deviation and prolonged QTc.    Assessment/Plan: Principal Problem:   Hyponatremia Active Problems:   Syncope   Takotsubo cardiomyopathy   Hyperlipidemia   Essential hypertension   Prolonged Q-T interval on ECG   Hypothyroidism   Complex regional pain syndrome I, unspecified   Aortic atherosclerosis (HCC)    Assessment and Plan: Hyponatremia History of hyponatremia due to hydrochlorothiazide  -Patient presented emergency department complaining of possible syncopal episode.  Reported that she has been not feeling well for last few days  complaining about lightheadedness, lower back pain and intermittent loose stool for last few days - EMS reported patient's blood pressure is soft upper 80s range however while patient in the ED blood pressure is persistently elevated upper 170s range. -CBC unremarkable with stable H&H. - CMP showed low sodium 126 and low chloride 96.  UA unremarkable.  Pending serum osmolarity, urine osmolarity and urine sodium. Concern for hyponatremia in the setting of diarrhea, with combination of spironolactone  and Benicar .  Holding Aldactone  and Benicar . - Starting NS 75 cc/h and continue to check serum sodium level every 6 hours. -Checking TSH level.  Syncope likely secondary from hyponatremia -Patient reported feeling lightheadedness over the course of last few days and possibly she has been passed out today which  is not completely clear clear. -EMS reported blood pressure was low 80s range however since patient in the ED systolic blood pressure upper 469G range. -EKG showing normal sinus rhythm no evidence of arrhythmia. -CT head no acute intracranial abnormality. - Checking orthostatic vital. - Syncope likely secondary from hyponatremia vs orthostatic hypotension vs dehydration from diarrhea vs symptomatic mitral valve regurgitation - Given patient has history of mitral valve prolapse and mitral regurgitation obtaining obtaining echocardiogram.  Consider to consult cardiology based on echocardiogram finding. - Continue cardiac monitoring to assess any arrhythmia.   History of Takotsubo cardiomyopathy Essential hypertension History of mitral regurgitation and mitral valve prolapse -Given patient has labile blood pressure holding amlodipine .  Holding Aldactone  and Benicar  in the setting of hyponatremia. -As patient blood pressure was elevated while in the ED received labetalol  10 mg. - Continue hydralazine  100 mg in the a.m., 50 mg in the afternoon and 100 mg in the p.m. -Will resume other blood  pressure regimen once appropriate.  Prolonged QTc - EKG showing prolonged QTc.  Need to avoid further QT prolonging medications.   History of Graves' disease Hypothyroidism -Continue levothyroxine  75 mcg daily  Complex regional pain syndrome -Continue gabapentin  and tramadol  as needed.  Hyperlipidemia and aortic atherosclerosis -Continue Lipitor   DVT prophylaxis:  SCDs.  Deferring pharmacological DVT prophylaxis until CT head will be resulted Code Status:  Full Code Diet: Heart healthy diet Family Communication:   Family was present at bedside, at the time of interview. Opportunity was given to ask question and all questions were answered satisfactorily.  Disposition Plan: Continue monitor improvement of serum sodium level.  Pending echocardiogram. Consults: None at this time Admission status:   Inpatient, progressive unit  Severity of Illness: The appropriate patient status for this patient is INPATIENT. Inpatient status is judged to be reasonable and necessary in order to provide the required intensity of service to ensure the patient's safety. The patient's presenting symptoms, physical exam findings, and initial radiographic and laboratory data in the context of their chronic comorbidities is felt to place them at high risk for further clinical deterioration. Furthermore, it is not anticipated that the patient will be medically stable for discharge from the hospital within 2 midnights of admission.   * I certify that at the point of admission it is my clinical judgment that the patient will require inpatient hospital care spanning beyond 2 midnights from the point of admission due to high intensity of service, high risk for further deterioration and high frequency of surveillance required.Aaron Aas    Anita Mcadory, MD [facility]  How to contact the [facility] Attending or Consulting provider 7A - 7P or covering provider during after hours 7P -7A, for this patient.  Check the care  team in [facility] and look for a) attending/consulting [facility] provider listed and b) the [facility] team listed Log into [email or web address] and use [place]'s universal password to access. If you do not have the password, please contact the hospital operator. Locate the [facility] provider you are looking for under [facility] and page to a number that you can be directly reached. If you still have difficulty reaching the provider, please page the [facility] (Director on Call) for the Hospitalists listed on amion for assistance.  12/13/2023, 3:16 AM

## 2023-12-13 ENCOUNTER — Encounter: Payer: Self-pay | Admitting: Neurology

## 2023-12-13 ENCOUNTER — Inpatient Hospital Stay (HOSPITAL_COMMUNITY)

## 2023-12-13 DIAGNOSIS — R55 Syncope and collapse: Secondary | ICD-10-CM | POA: Diagnosis not present

## 2023-12-13 LAB — BASIC METABOLIC PANEL WITH GFR
Anion gap: 11 (ref 5–15)
Anion gap: 13 (ref 5–15)
BUN: 16 mg/dL (ref 8–23)
BUN: 12 mg/dL (ref 8–23)
CO2: 21 mmol/L — ABNORMAL LOW (ref 22–32)
CO2: 17 mmol/L — ABNORMAL LOW (ref 22–32)
Calcium: 9.3 mg/dL (ref 8.9–10.3)
Calcium: 9.1 mg/dL (ref 8.9–10.3)
Chloride: 101 mmol/L (ref 98–111)
Chloride: 103 mmol/L (ref 98–111)
Creatinine, Ser: 0.73 mg/dL (ref 0.44–1.00)
Creatinine, Ser: 0.71 mg/dL (ref 0.44–1.00)
GFR, Estimated: >60 mL/min (ref >60)
GFR, Estimated: >60 mL/min (ref >60)
Glucose, Bld: 91 mg/dL (ref 70–99)
Glucose, Bld: 104 mg/dL — ABNORMAL HIGH (ref 70–99)
Potassium: 3.9 mmol/L (ref 3.5–5.1)
Potassium: 4.2 mmol/L (ref 3.5–5.1)
Sodium: 133 mmol/L — ABNORMAL LOW (ref 135–145)
Sodium: 133 mmol/L — ABNORMAL LOW (ref 135–145)

## 2023-12-13 LAB — COMPREHENSIVE METABOLIC PANEL WITH GFR
ALT: 13 U/L (ref 0–44)
AST: 20 U/L (ref 15–41)
Albumin: 3.9 g/dL (ref 3.5–5.0)
Alkaline Phosphatase: 47 U/L (ref 38–126)
Anion gap: 10 (ref 5–15)
BUN: 16 mg/dL (ref 8–23)
CO2: 21 mmol/L — ABNORMAL LOW (ref 22–32)
Calcium: 9.3 mg/dL (ref 8.9–10.3)
Chloride: 98 mmol/L (ref 98–111)
Creatinine, Ser: 0.67 mg/dL (ref 0.44–1.00)
GFR, Estimated: >60 mL/min (ref >60)
Glucose, Bld: 156 mg/dL — ABNORMAL HIGH (ref 70–99)
Potassium: 3.8 mmol/L (ref 3.5–5.1)
Sodium: 129 mmol/L — ABNORMAL LOW (ref 135–145)
Total Bilirubin: 0.7 mg/dL (ref 0.0–1.2)
Total Protein: 6.3 g/dL — ABNORMAL LOW (ref 6.5–8.1)

## 2023-12-13 LAB — CBC
HCT: 35 % — ABNORMAL LOW (ref 36.0–46.0)
Hemoglobin: 11 g/dL — ABNORMAL LOW (ref 12.0–15.0)
MCH: 32 pg (ref 26.0–34.0)
MCHC: 31.4 g/dL (ref 30.0–36.0)
MCV: 101.7 fL — ABNORMAL HIGH (ref 80.0–100.0)
Platelets: 250 10*3/uL (ref 150–400)
RBC: 3.44 MIL/uL — ABNORMAL LOW (ref 3.87–5.11)
RDW: 12.5 % (ref 11.5–15.5)
WBC: 8.3 10*3/uL (ref 4.0–10.5)
nRBC: 0 % (ref 0.0–0.2)

## 2023-12-13 LAB — TSH: TSH: 5.233 u[IU]/mL — ABNORMAL HIGH (ref 0.350–4.500)

## 2023-12-13 LAB — OSMOLALITY: Osmolality: 276 mosm/kg (ref 275–295)

## 2023-12-13 MED ORDER — AMLODIPINE BESYLATE 5 MG PO TABS: 5.0000 mg | ORAL_TABLET | Freq: Every day | ORAL | Status: DC

## 2023-12-13 MED ORDER — HYDRALAZINE HCL 50 MG PO TABS
100.0000 mg | ORAL_TABLET | Freq: Two times a day (BID) | ORAL | Status: DC
  Administered 2023-12-13: 100 mg via ORAL
  Filled 2023-12-13: qty 2

## 2023-12-13 MED ORDER — HYDRALAZINE HCL 50 MG PO TABS: 100.0000 mg | ORAL_TABLET | Freq: Two times a day (BID) | ORAL | Status: DC

## 2023-12-13 MED ORDER — IRBESARTAN 300 MG PO TABS: 300.0000 mg | ORAL_TABLET | Freq: Every day | ORAL | Status: DC

## 2023-12-13 MED ORDER — HYDRALAZINE HCL 50 MG PO TABS
50.0000 mg | ORAL_TABLET | Freq: Every day | ORAL | Status: DC
  Administered 2023-12-13: 50 mg via ORAL
  Filled 2023-12-13: qty 1

## 2023-12-13 MED ORDER — HYDRALAZINE HCL 50 MG PO TABS: 50.0000 mg | ORAL_TABLET | Freq: Every day | ORAL | Status: DC

## 2023-12-13 NOTE — Care Management Obs Status (Signed)
 MEDICARE OBSERVATION STATUS NOTIFICATION   Patient Details  Name: [name] MRN: [id number] Date of Birth: [birth year]   Medicare Observation Status Notification Given:  Yes    Zenon Hilda, LCSW 12/13/2023, 2:35 PM

## 2023-12-13 NOTE — Hospital Course (Signed)
 82yo with h/o hypothyroidism; chronic complex regional pain syndrome s/p sympathetic nerve block with spinal cord stimulator, Qutenza patch, and Botox ; Takotsubo CM in 2014 due to Graves' disease; HTN; HLD; and PAF who presented on 5/8 with near syncope.  Found to have hypoantremia, given IVF with improvement.

## 2023-12-13 NOTE — Discharge Summary (Signed)
 Physician Discharge Summary   Patient: [name] MRN: [id number] DOB: 03-13-1941  Admit date:     12/12/2023  Discharge date: 12/13/23  Discharge Physician: Lorita Rosa   PCP: Imelda Man, MD   Recommendations at discharge:   Maintain good hydration; supplement with electrolytes (Gatorade, Liquid IV) as needed Hold spironolactone  for now Hydrochlorothiazide  was previously stopped and should not be restarted Follow up with cardiology for repeat echocardiogram; call for appointment Follow up with Dr. Schuyler Custard in 1-2 weeks for recheck  Discharge Diagnoses: Principal Problem:   Near syncope Active Problems:   Hyponatremia   Hyperlipidemia   Essential hypertension   Prolonged Q-T interval on ECG   Hypothyroidism   Complex regional pain syndrome I, unspecified   Aortic atherosclerosis (HCC)  Resolved Problems:   * No resolved hospital problems. 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  Found to have hypoantremia, given IVF with improvement.  Assessment and Plan:   Near syncope likely secondary from hyponatremia Patient reported feeling lightheadedness over the course of last few days and leaned over and lowered herself to the floor, possible very transient LOC EMS reported blood pressure was low 80s range which recovered quickly in the ER EKG showing normal sinus rhythm  CT head no acute intracranial abnormality Syncope likely secondary from hyponatremia vs orthostatic hypotension Symptoms have resolved and patient would like to f/u with cardiology for possible repeat echo  Hyponatremia History of hyponatremia due to hydrochlorothiazide  Had been feeling badly for a few days PTA with lightheadedness, lower back pain and intermittent  loose stool for last few days Na++ 126  Concern for hyponatremia in the setting of diarrhea/poor PO intake, with combination of spironolactone  and Benicar  Na++ improved to 133 and patient feels much better today   History of Takotsubo cardiomyopathy Essential hypertension History of mitral regurgitation and mitral valve prolapse Echo in 03/2023 with preserved EF, mild to moderate MVR with moderate MVP Labile blood pressure initially so held amlodipine  Continue hydralazine  Resume amlodipine  and olmesartan  Continue to hold spironolactone  for now   Prolonged QTc EKG showing prolonged QTc Avoid QT prolonging medications when possible    Hypothyroidism Post Graves disease Continue levothyroxine  75 mcg daily   Complex regional pain syndrome Continue gabapentin  and tramadol  as needed   Hyperlipidemia and aortic atherosclerosis Continue Lipitor     Consultants: None  Procedures: Echocardiogram 5/9  Antibiotics: None  30 Day Unplanned Readmission Risk Score    Flowsheet Row ED to Hosp-Admission (Current) from 12/12/2023 in [place] 4TH FLOOR PROGRESSIVE CARE AND UROLOGY  30 Day Unplanned Readmission Risk Score (%) 12.69 Filed at 12/13/2023 0801       This score is the patient's risk of an unplanned readmission within 30 days of being discharged (0 -100%). The score is based on dignosis, age, lab data, medications, orders, and past utilization.   Low:  0-14.9   Medium: 15-21.9   High: 22-29.9   Extreme: 30 and above          Pain control - [place]  Controlled Substance Reporting System database was reviewed. and patient was instructed, not to drive, operate heavy machinery, perform activities at heights, swimming or participation in water activities or provide baby-sitting services while on Pain, Sleep and Anxiety Medications; until their outpatient Physician has  advised to do so again. Also recommended to not to take more than prescribed Pain, Sleep and Anxiety  Medications.   Disposition: Home Diet recommendation:  Cardiac diet DISCHARGE MEDICATION: Allergies as of 12/13/2023   No Known Allergies      Medication List     PAUSE taking these medications    spironolactone  25 MG tablet Wait to take this until your doctor or other care provider tells you to start again. Commonly known as: Aldactone  Take 0.5 tablets (12.5 mg total) by mouth daily.       STOP taking these medications    metoprolol  succinate 25 MG 24 hr tablet Commonly known as: TOPROL -XL       TAKE these medications    amLODipine  5 MG tablet Commonly known as: NORVASC  Take 1 tablet (5 mg total) by mouth daily.   atorvastatin  20 MG tablet Commonly known as: LIPITOR Take 1 tablet (20 mg total) by mouth daily.   Biotin  5000 MCG Caps Take 1 capsule by mouth daily.   CALTRATE 600 PO Take 1 tablet by mouth 2 (two) times a day.   Cholecalciferol  25 MCG (1000 UT) capsule Take 1,000 Units by mouth daily.   diclofenac sodium 1 % Gel Commonly known as: VOLTAREN Apply 1 application  topically 4 (four) times daily as needed (pain).   gabapentin  300 MG capsule Commonly known as: NEURONTIN  Take 1 capsule (300 mg total) by mouth 3 (three) times daily as needed. What changed: See the new instructions.   hydrALAZINE  50 MG tablet Commonly known as: APRESOLINE  Take 100 mg in AM, 50 mg in the afternoon, and 100 mg at bedtime. What changed:  how much to take how to take this when to take this   ipratropium 0.06 % nasal spray Commonly known as: ATROVENT Place 2 sprays into both nostrils 3 (three) times daily as needed for rhinitis.   levothyroxine  75 MCG tablet Commonly known as: SYNTHROID  Take 1 tablet (75 mcg total) by mouth daily.   meloxicam 15 MG tablet Commonly known as: MOBIC Take 15 mg by mouth daily as needed for pain.   metroNIDAZOLE  0.75 % cream Commonly known as: METROCREAM  Apply 1 application topically 2 (two) times daily.   NONFORMULARY OR  COMPOUNDED ITEM Apply 1-2 g topically in the morning, at noon, in the evening, and at bedtime. Compound cream: Meloxicam 0.5% Doxepin 3% Amantadine 3% Dextromethorphan 2% Lidocaine  2%  Sig: Apply 1-2 grams to the affected area 3-4 times daily. Dispense 240 grams with 5 refills.   olmesartan  40 MG tablet Commonly known as: BENICAR  Take 1 tablet (40 mg total) by mouth daily.   pantoprazole  40 MG tablet Commonly known as: PROTONIX  Take 1 tablet by mouth daily as needed (heartburn).   QUTENZA EX Apply 1 patch topically every 3 (three) months.   traMADol  50 MG tablet Commonly known as: ULTRAM  Take 1 tablet by mouth every 6 (six) hours as needed for severe pain (pain score 7-10) or moderate pain (pain score 4-6).   traZODone 50 MG tablet Commonly known as: DESYREL Take 50 mg by mouth at bedtime as needed for sleep.        Discharge Exam:   Subjective: Pleasant, feeling better, would like to go home today.  Lives alone but her daughter from [place] will be staying with her for a few days.   Objective: Vitals:   12/12/23 2145 12/12/23 2249  BP: (!) 159/116 (!) 154/87  Pulse: (!) 104 83  Resp: 19 16  Temp:  98.4 F (36.9 C)  SpO2: 97% 96%    Intake/Output Summary (Last 24 hours) at 12/13/2023 1454 Last data filed at 12/13/2023 1224 Gross per 24 hour  Intake 1462.5 ml  Output 1600 ml  Net -137.5 ml   Filed Weights   12/12/23 2249  Weight: 60.6 kg    Exam:  General:  Appears calm and comfortable and is in NAD Eyes:  EOMI, normal lids, iris ENT:  grossly normal hearing, lips & tongue, mmm Cardiovascular:  RRR. No LE edema.  Respiratory:   CTA bilaterally with no wheezes/rales/rhonchi.  Normal respiratory effort. Abdomen:  soft, NT, ND Skin:  no rash or induration seen on limited exam Musculoskeletal:  grossly normal tone BUE/BLE, good ROM, no bony abnormality Psychiatric:  grossly normal mood and affect, speech fluent and appropriate, AOx3 Neurologic:  CN  2-12 grossly intact, moves all extremities in coordinated fashion   Data Reviewed: I have reviewed the patient's lab results since admission.  Pertinent labs for today include:   Na++ 133, up from 126 CO2 21, not clinically significant TSH 5.233    Condition at discharge: stable  The results of significant diagnostics from this hospitalization (including imaging, microbiology, ancillary and laboratory) are listed below for reference.   Imaging Studies: CT HEAD WO CONTRAST ( ) Result Date: 12/13/2023 CLINICAL DATA:  Recent syncopal episode EXAM: CT HEAD WITHOUT CONTRAST TECHNIQUE: Contiguous axial images were obtained from the base of the skull through the vertex without intravenous contrast. RADIATION DOSE REDUCTION: This exam was performed according to the departmental dose-optimization program which includes automated exposure control, adjustment of the mA and/or kV according to patient size and/or use of iterative reconstruction technique. COMPARISON:  04/29/2023 FINDINGS: Brain: No evidence of acute infarction, hemorrhage, hydrocephalus, extra-axial collection or mass lesion/mass effect. Mild atrophic changes are noted. Vascular: No hyperdense vessel or unexpected calcification. Skull: Normal. Negative for fracture or focal lesion. Sinuses/Orbits: No acute finding. Other: None. IMPRESSION: No acute intracranial abnormality noted. Electronically Signed   By: Violeta Grey M.D.   On: 12/13/2023 01:06   DG Chest 2 View Result Date: 12/12/2023 CLINICAL DATA:  Syncope.  Lightheadedness EXAM: CHEST - 2 VIEW COMPARISON:  X-ray 06/19/2013 FINDINGS: No consolidation, pneumothorax or effusion. No edema. Normal cardiopericardial silhouette. Overlapping cardiac leads. Degenerative changes along the spine. IMPRESSION: No acute cardiopulmonary disease. Electronically Signed   By: Adrianna Horde M.D.   On: 12/12/2023 18:14    Microbiology: Results for orders placed or performed during the hospital  encounter of 04/15/22  C Difficile Quick Screen w PCR reflex     Status: None   Collection Time: 04/15/22  8:17 AM   Specimen: Stool  Result Value Ref Range Status   C Diff antigen NEGATIVE NEGATIVE Final   C Diff toxin NEGATIVE NEGATIVE Final   C Diff interpretation No C. difficile detected.  Final    Comment: Performed at [facility], [address], [place], [place] [address]  Gastrointestinal Panel by PCR , Stool     Status: None   Collection Time: 04/15/22  9:17 AM   Specimen: Stool  Result Value Ref Range Status   Campylobacter species NOT DETECTED NOT DETECTED Final   Plesimonas shigelloides NOT DETECTED NOT DETECTED Final   Salmonella species NOT DETECTED NOT DETECTED Final   Yersinia enterocolitica NOT DETECTED NOT DETECTED Final   Vibrio species NOT DETECTED NOT DETECTED Final   Vibrio cholerae NOT DETECTED NOT DETECTED Final   Enteroaggregative E coli (EAEC) NOT DETECTED  NOT DETECTED Final   Enteropathogenic E coli (EPEC) NOT DETECTED NOT DETECTED Final   Enterotoxigenic E coli (ETEC) NOT DETECTED NOT DETECTED Final   Shiga like toxin producing E coli (STEC) NOT DETECTED NOT DETECTED Final   Shigella/Enteroinvasive E coli (EIEC) NOT DETECTED NOT DETECTED Final   Cryptosporidium NOT DETECTED NOT DETECTED Final   Cyclospora cayetanensis NOT DETECTED NOT DETECTED Final   Entamoeba histolytica NOT DETECTED NOT DETECTED Final   Giardia lamblia NOT DETECTED NOT DETECTED Final   Adenovirus F40/41 NOT DETECTED NOT DETECTED Final   Astrovirus NOT DETECTED NOT DETECTED Final   Norovirus GI/GII NOT DETECTED NOT DETECTED Final   Rotavirus A NOT DETECTED NOT DETECTED Final   Sapovirus (I, II, IV, and V) NOT DETECTED NOT DETECTED Final    Comment: Performed at [facility], [address] [place], [place] [address]    Labs: CBC: Recent Labs  Lab 12/12/23 1726 12/13/23 0028  WBC 9.0 8.3  NEUTROABS 6.4  --   HGB 11.4* 11.0*  HCT 36.0 35.0*  MCV  99.7 101.7*  PLT 258 250   Basic Metabolic Panel: Recent Labs  Lab 12/12/23 1726 12/13/23 0028 12/13/23 0509 12/13/23 1142  NA 126* 129* 133* 133*  K 3.7 3.8 3.9 4.2  CL 96* 98 101 103  CO2 20* 21* 21* 17*  GLUCOSE 101* 156* 91 104*  BUN 17 16 16 12   CREATININE 0.92 0.67 0.73 0.71  CALCIUM  8.7* 9.3 9.3 9.1   Liver Function Tests: Recent Labs  Lab 12/12/23 1726 12/13/23 0028  AST 19 20  ALT 12 13  ALKPHOS 45 47  BILITOT 0.9 0.7  PROT 6.2* 6.3*  ALBUMIN 4.0 3.9   CBG: Recent Labs  Lab 12/12/23 1702  GLUCAP 100*    Discharge time spent: greater than 30 minutes.  Signed: Lorita Rosa, MD [facility] 12/13/2023

## 2023-12-13 NOTE — Progress Notes (Signed)
 Discharge instructions were reviewed verbally with patient using computer as a flag prevented printing of AVS. Patient and her daughter verbalized understanding.

## 2023-12-13 NOTE — Plan of Care (Signed)
  Problem: Health Behavior/Discharge Planning: Goal: Ability to manage health-related needs will improve Outcome: Progressing   Problem: Clinical Measurements: Goal: Respiratory complications will improve Outcome: Progressing   Problem: Clinical Measurements: Goal: Cardiovascular complication will be avoided Outcome: Progressing   Problem: Clinical Measurements: Goal: Diagnostic test results will improve Outcome: Progressing   Problem: Activity: Goal: Risk for activity intolerance will decrease Outcome: Progressing

## 2023-12-13 NOTE — Care Management CC44 (Signed)
 Condition Code 44 Documentation Completed  Patient Details  Name: [name] MRN: [id number] Date of Birth: 1941/06/29   Condition Code 44 given:  Yes Patient signature on Condition Code 44 notice:  Yes Documentation of 2 MD's agreement:  Yes Code 44 added to claim:  Yes    Zenon Hilda, LCSW 12/13/2023, 2:35 PM

## 2023-12-13 NOTE — Progress Notes (Signed)
 During AM assessment and medication administration, patient verbalized concerns about her blood pressure medications and vitamins. Patient requested Tylenol  for pain management and is aware there is also an order for Tramadol . Stated she would try Tylenol  first before taking Tramadol . Discussed with patient orders regarding ted hose and SCDs. Patient has requested that these orders be held as she hopes to discharge later today. Discussed with patient her present sodium level. NS @ 75 ml/hr infusing as ordered. Left patient in bed in lowest position and call bell within reach.

## 2023-12-13 NOTE — Progress Notes (Signed)
   12/13/23 0949  TOC Brief Assessment  Insurance and Status Reviewed  Patient has primary care physician Yes  Home environment has been reviewed Resides alone in single family home  Prior level of function: Independent with ADLs at baseline  Prior/Current Home Services No current home services  Social Drivers of Health Review SDOH reviewed no interventions necessary  Readmission risk has been reviewed Yes  Transition of care needs no transition of care needs at this time

## 2023-12-16 ENCOUNTER — Telehealth: Payer: Self-pay | Admitting: Cardiology

## 2023-12-16 ENCOUNTER — Ambulatory Visit (HOSPITAL_BASED_OUTPATIENT_CLINIC_OR_DEPARTMENT_OTHER): Admitting: Nurse Practitioner

## 2023-12-16 VITALS — BP 154/72 | HR 74 | Ht 63.0 in | Wt 129.0 lb

## 2023-12-16 DIAGNOSIS — I5181 Takotsubo syndrome: Secondary | ICD-10-CM | POA: Diagnosis not present

## 2023-12-16 DIAGNOSIS — E039 Hypothyroidism, unspecified: Secondary | ICD-10-CM

## 2023-12-16 DIAGNOSIS — I951 Orthostatic hypotension: Secondary | ICD-10-CM

## 2023-12-16 DIAGNOSIS — R011 Cardiac murmur, unspecified: Secondary | ICD-10-CM | POA: Diagnosis not present

## 2023-12-16 DIAGNOSIS — I340 Nonrheumatic mitral (valve) insufficiency: Secondary | ICD-10-CM | POA: Diagnosis not present

## 2023-12-16 DIAGNOSIS — I341 Nonrheumatic mitral (valve) prolapse: Secondary | ICD-10-CM | POA: Diagnosis not present

## 2023-12-16 DIAGNOSIS — K529 Noninfective gastroenteritis and colitis, unspecified: Secondary | ICD-10-CM

## 2023-12-16 DIAGNOSIS — E871 Hypo-osmolality and hyponatremia: Secondary | ICD-10-CM | POA: Diagnosis not present

## 2023-12-16 NOTE — Telephone Encounter (Signed)
 STAT if patient feels like he/she is going to faint   1. Are you feeling dizzy, lightheaded, or faint right now? No, she is laying down    2. Have you passed out?  No when she get up she kind of feels like she might   3. Do you have any other symptoms?  Patient denies any other symptoms    4. Have you checked your HR and BP (record if available)? She has not check today.    Patient fell last week at the [employer].  They Took her to the [place] and she admitted 1 night.   Best  [phone]

## 2023-12-16 NOTE — Patient Instructions (Addendum)
 Medication Instructions:   Discontinue Aldactone .   Your physician recommends that you continue on your current medications as directed. Please refer to the Current Medication list given to you today.   *If you need a refill on your cardiac medications before your next appointment, please call your pharmacy*  Lab Work:  Your physician recommends that you return for lab work in one week. No fasting.    If you have labs (blood work) drawn today and your tests are completely normal, you will receive your results only by: MyChart Message (if you have MyChart) OR A paper copy in the mail If you have any lab test that is abnormal or we need to change your treatment, we will call you to review the results.  Testing/Procedures:  Your physician has requested that you have an echocardiogram. Echocardiography is a painless test that uses sound waves to create images of your heart. It provides your doctor with information about the size and shape of your heart and how well your heart's chambers and valves are working. This procedure takes approximately one hour. There are no restrictions for this procedure. Please do NOT wear cologne, perfume or lotions (deodorant is allowed). Please arrive 15 minutes prior to your appointment time.  Please note: We ask at that you not bring children with you during ultrasound (echo/ vascular) testing. Due to room size and safety concerns, children are not allowed in the ultrasound rooms during exams. Our front office staff cannot provide observation of children in our lobby area while testing is being conducted. An adult accompanying a patient to their appointment will only be allowed in the ultrasound room at the discretion of the ultrasound technician under special circumstances. We apologize for any inconvenience.   Follow-Up: At [facility], you and your health needs are our priority.  As part of our continuing mission to provide you with exceptional  heart care, our providers are all part of one team.  This team includes your primary Cardiologist (physician) and Advanced Practice Providers or APPs (Physician Assistants and Nurse Practitioners) who all work together to provide you with the care you need, when you need it.  Your next appointment:   TBD    Provider:   TBD    We recommend signing up for the patient portal called "MyChart".  Sign up information is provided on this After Visit Summary.  MyChart is used to connect with patients for Virtual Visits (Telemedicine).  Patients are able to view lab/test results, encounter notes, upcoming appointments, etc.  Non-urgent messages can be sent to your provider as well.   To learn more about what you can do with MyChart, go to [email or web address].   Other Instructions  Please take your blood pressure sitting and than standing for 2 minutes. Call in or send mychart message in a week.  If you come here for labs you can drop off your paperwork. Your goal is 130/80 or below.

## 2023-12-16 NOTE — Progress Notes (Unsigned)
 Cardiology Office Note:  .   Date:  12/17/2023  ID:  [name], DOB September 26, 1940, MRN [id number] PCP: Alexis Man, MD  [facility] Providers Cardiologist:  Alexis Donning, MD    Patient Profile: .      PMH Takotsubo cardiomyopathy in 2014 Mitral valve prolapse Mitral regurgitation Hyperlipidemia Aortic atherosclerosis Hypertension  Tachycardia cardiomyopathy 2014 due to Graves' disease which was treated with RAI. Echo 2014 with normal LVEF, severe pulmonary hypertension (RVSP 63 mmHg).  Repeat echo improvement in PA pressure to normal 25 mmHg, moderate MVP, mild MR.  Echo 04/2020 LVEF 60 to 65%, mild LVH, mild MR, normal PASP, mild TR.  TTE 03/2023 normal LVEF, mild to moderate MR, moderate MVP which was stable from previous.  She had isolated PAF in the setting of Graves' disease and Takotsubo cardiomyopathy.  Last cardiology clinic visit was 10/24/2023 with Alexis Banks, NP.  Her hydralazine  dose had been increased since last visit.  She was having more issues with her back pain and MRI did reveal a bone spur.  She attributed some of her elevated BP readings to pain and had been less active.  Average BP from home log 126/75.  She is not having any concerning cardiac symptoms and July follow-up with Dr. Veryl [name] was advised.   Admission 12/13/2023 with near syncope thought to be secondary to hyponatremia.  She had lightheadedness over the few days prior to admission.  She had been feeling badly for a few days PTA with loose stools, lower back pain, and lightheadedness.  Sodium was 126 on arrival.  Spironolactone  was held.       History of Present Illness: .    History of Present Illness [name] is a pleasant 83 year old female who is here today for follow-up of fatigue and recent hospital admission. She reports continued fatigue and dizziness which is what prompted her to go to hospital on 12/12/23. She is accompanied by her daughter, [name]. Reported that  PTA, she bent over and nearly passed out from extreme lightheadedness. Felt a "vice-like" tightening in her head. Home BP has been labile. She reports a sensation of her heart racing, with her heart rate sometimes reaching the low hundreds, which is uncomfortable. No episodes of significant highs or lows on Apple watch. She has had a murmur for years, secondary to MVP and MR, however ED physician suggested to her that it was quite significant. Echo 03/2023 revealed moderate mitral valve prolapse with mild to moderate MR. She reports good hydration, drinking about three glasses of water a day, along with a small amount of coffee and tea, and has recently started drinking electrolyte beverages. She is a poor historian, with assistance provided  by her daughter.   Discussed the use of AI scribe software for clinical note transcription with the patient, who gave verbal consent to proceed.   ROS: See HPI       Studies Reviewed: [name]        No results found for: "LIPOA"   Risk Assessment/Calculations:             Physical Exam:   VS:  BP 112/71   Pulse 74   Ht 5\' 3"  (1.6 m)   Wt 129 lb (58.5 kg)   SpO2 98%   BMI 22.85 kg/m    Wt Readings from Last 3 Encounters:  12/16/23 129 lb (58.5 kg)  12/12/23 133 lb 9.6 oz (60.6 kg)  10/28/23 136 lb 9.6 oz (62 kg)  GEN: Well nourished, well developed in no acute distress NECK: No JVD; No carotid bruits CARDIAC: RRR, 3/6 holosystolic murmur. No rubs, gallops RESPIRATORY:  Clear to auscultation without rales, wheezing or rhonchi  ABDOMEN: Soft, non-tender, non-distended EXTREMITIES:  No edema; No deformity     ASSESSMENT AND PLAN: .    Assessment & Plan Mitral  Valve Disorder   Recent increase in lightheadedness, fatigue. Significant murmur suggests potential acute worsening of MVP. We will get echo ASAP to evaluate valve function.   Orthostatic Hypotension   Blood pressure drops from 154 mmHg lying down to 112 mmHg standing. She experienced  presyncope recently when bending forward. She discontinued spironolactone  one day ago. Continue to hold spironolactone . Monitor BP at home, both sitting and standing, at least 2 hours after taking olmesartan . Consider reducing hydralazine  dosage after discontinuing spironolactone  if she remains orthostatic. Encouraged good hydration with 50-60 ounces of water daily, not excessive intake due to hx of hyponatremia. Advised her to send BP readings in one week.   Takotsubo cardiomyopathy History of cardiomyopathy due to acute exacerbation of Graves' disease.  EF on most recent echo 03/2023 with normal LVEF.  She is having fatigue and lightheadedness as noted above.  No significant shortness of breath, orthopnea, PND, or edema.  She appears euvolemic on exam.  We are getting echocardiogram to evaluate heart and valve function.  Hyponatremia   Recent hospitalization showed low sodium levels at 126  likely due to spironolactone . Sodium improved to 133 mmol/L upon discharge.  Spironolactone  has been discontinued.  Limit free water intake to 50 to 60 ounces per day.  Continue drinking electrolyte water.  Repeat BMET in 1 week.  Hypothyroidism   Elevated TSH during recent hospitalization indicates low thyroid  function.  She reports thyroid  function has been stable prior to hospital admission.  Management per PCP.  Microscopic Colitis   Microscopic colitis with fluctuating bowel habits.  Intermittent diarrhea and constipation.  No significant diarrhea over the past few days.  Follow-up   Follow-up is needed to assess blood pressure management and mitral valve function. Schedule an echocardiogram at the earliest available location. Follow up with blood pressure readings and lab work results.          Disposition:TBD based on echo results  Signed, Alexis Duncan, NP-C

## 2023-12-16 NOTE — H&P (View-Only) (Signed)
 Cardiology Office Note:  .   Date:  12/17/2023  ID:  [name], DOB September 26, 1940, MRN [id number] PCP: Alexis Man, MD  [facility] Providers Cardiologist:  Alexis Donning, MD    Patient Profile: .      PMH Takotsubo cardiomyopathy in 2014 Mitral valve prolapse Mitral regurgitation Hyperlipidemia Aortic atherosclerosis Hypertension  Tachycardia cardiomyopathy 2014 due to Graves' disease which was treated with RAI. Echo 2014 with normal LVEF, severe pulmonary hypertension (RVSP 63 mmHg).  Repeat echo improvement in PA pressure to normal 25 mmHg, moderate MVP, mild MR.  Echo 04/2020 LVEF 60 to 65%, mild LVH, mild MR, normal PASP, mild TR.  TTE 03/2023 normal LVEF, mild to moderate MR, moderate MVP which was stable from previous.  She had isolated PAF in the setting of Graves' disease and Takotsubo cardiomyopathy.  Last cardiology clinic visit was 10/24/2023 with Alexis Banks, NP.  Her hydralazine  dose had been increased since last visit.  She was having more issues with her back pain and MRI did reveal a bone spur.  She attributed some of her elevated BP readings to pain and had been less active.  Average BP from home log 126/75.  She is not having any concerning cardiac symptoms and July follow-up with Dr. Veryl [name] was advised.   Admission 12/13/2023 with near syncope thought to be secondary to hyponatremia.  She had lightheadedness over the few days prior to admission.  She had been feeling badly for a few days PTA with loose stools, lower back pain, and lightheadedness.  Sodium was 126 on arrival.  Spironolactone  was held.       History of Present Illness: .    History of Present Illness [name] is a pleasant 83 year old female who is here today for follow-up of fatigue and recent hospital admission. She reports continued fatigue and dizziness which is what prompted her to go to hospital on 12/12/23. She is accompanied by her daughter, [name]. Reported that  PTA, she bent over and nearly passed out from extreme lightheadedness. Felt a "vice-like" tightening in her head. Home BP has been labile. She reports a sensation of her heart racing, with her heart rate sometimes reaching the low hundreds, which is uncomfortable. No episodes of significant highs or lows on Apple watch. She has had a murmur for years, secondary to MVP and MR, however ED physician suggested to her that it was quite significant. Echo 03/2023 revealed moderate mitral valve prolapse with mild to moderate MR. She reports good hydration, drinking about three glasses of water a day, along with a small amount of coffee and tea, and has recently started drinking electrolyte beverages. She is a poor historian, with assistance provided  by her daughter.   Discussed the use of AI scribe software for clinical note transcription with the patient, who gave verbal consent to proceed.   ROS: See HPI       Studies Reviewed: Aaron Aas        No results found for: "LIPOA"   Risk Assessment/Calculations:             Physical Exam:   VS:  BP 112/71   Pulse 74   Ht 5\' 3"  (1.6 m)   Wt 129 lb (58.5 kg)   SpO2 98%   BMI 22.85 kg/m    Wt Readings from Last 3 Encounters:  12/16/23 129 lb (58.5 kg)  12/12/23 133 lb 9.6 oz (60.6 kg)  10/28/23 136 lb 9.6 oz (62 kg)  GEN: Well nourished, well developed in no acute distress NECK: No JVD; No carotid bruits CARDIAC: RRR, 3/6 holosystolic murmur. No rubs, gallops RESPIRATORY:  Clear to auscultation without rales, wheezing or rhonchi  ABDOMEN: Soft, non-tender, non-distended EXTREMITIES:  No edema; No deformity     ASSESSMENT AND PLAN: .    Assessment & Plan Mitral  Valve Disorder   Recent increase in lightheadedness, fatigue. Significant murmur suggests potential acute worsening of MVP. We will get echo ASAP to evaluate valve function.   Orthostatic Hypotension   Blood pressure drops from 154 mmHg lying down to 112 mmHg standing. She experienced  presyncope recently when bending forward. She discontinued spironolactone  one day ago. Continue to hold spironolactone . Monitor BP at home, both sitting and standing, at least 2 hours after taking olmesartan . Consider reducing hydralazine  dosage after discontinuing spironolactone  if she remains orthostatic. Encouraged good hydration with 50-60 ounces of water daily, not excessive intake due to hx of hyponatremia. Advised her to send BP readings in one week.   Takotsubo cardiomyopathy History of cardiomyopathy due to acute exacerbation of Graves' disease.  EF on most recent echo 03/2023 with normal LVEF.  She is having fatigue and lightheadedness as noted above.  No significant shortness of breath, orthopnea, PND, or edema.  She appears euvolemic on exam.  We are getting echocardiogram to evaluate heart and valve function.  Hyponatremia   Recent hospitalization showed low sodium levels at 126  likely due to spironolactone . Sodium improved to 133 mmol/L upon discharge.  Spironolactone  has been discontinued.  Limit free water intake to 50 to 60 ounces per day.  Continue drinking electrolyte water.  Repeat BMET in 1 week.  Hypothyroidism   Elevated TSH during recent hospitalization indicates low thyroid  function.  She reports thyroid  function has been stable prior to hospital admission.  Management per PCP.  Microscopic Colitis   Microscopic colitis with fluctuating bowel habits.  Intermittent diarrhea and constipation.  No significant diarrhea over the past few days.  Follow-up   Follow-up is needed to assess blood pressure management and mitral valve function. Schedule an echocardiogram at the earliest available location. Follow up with blood pressure readings and lab work results.          Disposition:TBD based on echo results  Signed, Slater Duncan, NP-C

## 2023-12-16 NOTE — Telephone Encounter (Signed)
 Spoke with patient who stated she was admitted to hospital last week for 1 night When she was in grocery store had near syncope/syncope episode  Just hasn't been feeling well and has some dizziness/lightheadedness  Blood pressure today lying 125/68 HR 80, sitting 92/81 HR 81, standing 124/71 HR   Recommendations at discharge:    Maintain good hydration; supplement with electrolytes (Gatorade, Liquid IV) as needed Hold spironolactone  for now Hydrochlorothiazide  was previously stopped and should not be restarted Follow up with cardiology for repeat echocardiogram; call for appointment Follow up with Dr. Schuyler Custard in 1-2 weeks for recheck  Scheduled her appointment today to see Charlton Cooler NP  Advised patient to cal PCP as well to get scheduled

## 2023-12-17 ENCOUNTER — Encounter (HOSPITAL_BASED_OUTPATIENT_CLINIC_OR_DEPARTMENT_OTHER): Payer: Self-pay | Admitting: Nurse Practitioner

## 2023-12-17 ENCOUNTER — Ambulatory Visit (HOSPITAL_BASED_OUTPATIENT_CLINIC_OR_DEPARTMENT_OTHER)
Admission: RE | Admit: 2023-12-17 | Discharge: 2023-12-17 | Disposition: A | Discharge status: Home / Self Care | Source: Ambulatory Visit | Attending: Nurse Practitioner | Admitting: Nurse Practitioner

## 2023-12-17 DIAGNOSIS — R011 Cardiac murmur, unspecified: Secondary | ICD-10-CM | POA: Diagnosis not present

## 2023-12-17 DIAGNOSIS — I340 Nonrheumatic mitral (valve) insufficiency: Secondary | ICD-10-CM | POA: Diagnosis not present

## 2023-12-18 ENCOUNTER — Ambulatory Visit (HOSPITAL_BASED_OUTPATIENT_CLINIC_OR_DEPARTMENT_OTHER): Payer: Self-pay | Admitting: Nurse Practitioner

## 2023-12-18 ENCOUNTER — Encounter (HOSPITAL_BASED_OUTPATIENT_CLINIC_OR_DEPARTMENT_OTHER): Payer: Self-pay | Admitting: *Deleted

## 2023-12-18 LAB — ECHOCARDIOGRAM COMPLETE
AV Mean grad: 10 mmHg
AV Peak grad: 15.3 mmHg
Ao pk vel: 1.96 m/s
Area-P 1/2: 3.11 cm2
Calc EF: 74 %
MV M vel: 6.48 m/s
MV Peak grad: 167.7 mmHg
S' Lateral: 3 cm
Single Plane A2C EF: 69.6 %
Single Plane A4C EF: 78.2 %

## 2023-12-19 ENCOUNTER — Ambulatory Visit

## 2023-12-19 NOTE — Addendum Note (Signed)
 Addended by: Gerldine Koch on: 12/19/2023 06:12 AM   Modules accepted: Orders

## 2023-12-20 NOTE — Progress Notes (Signed)
 Spoke to patient and instructed them to come at 0630  and to be NPO after 0000.  Medications reviewed.    Confirmed that patient will have a ride home and someone to stay with them for 24 hours after the procedure.

## 2023-12-23 ENCOUNTER — Other Ambulatory Visit: Payer: Self-pay

## 2023-12-23 ENCOUNTER — Telehealth: Payer: Self-pay | Admitting: Cardiology

## 2023-12-23 ENCOUNTER — Telehealth (HOSPITAL_BASED_OUTPATIENT_CLINIC_OR_DEPARTMENT_OTHER): Payer: Self-pay | Admitting: *Deleted

## 2023-12-23 ENCOUNTER — Ambulatory Visit (HOSPITAL_COMMUNITY)

## 2023-12-23 ENCOUNTER — Ambulatory Visit (HOSPITAL_COMMUNITY)
Admission: RE | Admit: 2023-12-23 | Discharge: 2023-12-23 | Disposition: A | Discharge status: Home / Self Care | Source: Ambulatory Visit | Attending: Nurse Practitioner | Admitting: Nurse Practitioner

## 2023-12-23 ENCOUNTER — Ambulatory Visit (HOSPITAL_COMMUNITY)
Admission: RE | Admit: 2023-12-23 | Discharge: 2023-12-23 | Disposition: A | Discharge status: Home / Self Care | Attending: Cardiovascular Disease | Admitting: Cardiovascular Disease

## 2023-12-23 ENCOUNTER — Encounter (HOSPITAL_COMMUNITY)
Admission: RE | Disposition: A | Discharge status: Home / Self Care | Payer: Self-pay | Source: Home / Self Care | Attending: Cardiovascular Disease

## 2023-12-23 ENCOUNTER — Encounter (HOSPITAL_COMMUNITY): Payer: Self-pay | Admitting: Cardiovascular Disease

## 2023-12-23 DIAGNOSIS — I341 Nonrheumatic mitral (valve) prolapse: Secondary | ICD-10-CM | POA: Diagnosis not present

## 2023-12-23 DIAGNOSIS — N183 Chronic kidney disease, stage 3 unspecified: Secondary | ICD-10-CM | POA: Diagnosis not present

## 2023-12-23 DIAGNOSIS — I951 Orthostatic hypotension: Secondary | ICD-10-CM | POA: Diagnosis not present

## 2023-12-23 DIAGNOSIS — I340 Nonrheumatic mitral (valve) insufficiency: Secondary | ICD-10-CM

## 2023-12-23 DIAGNOSIS — Z79899 Other long term (current) drug therapy: Secondary | ICD-10-CM | POA: Diagnosis not present

## 2023-12-23 DIAGNOSIS — I083 Combined rheumatic disorders of mitral, aortic and tricuspid valves: Secondary | ICD-10-CM | POA: Diagnosis not present

## 2023-12-23 DIAGNOSIS — E871 Hypo-osmolality and hyponatremia: Secondary | ICD-10-CM | POA: Diagnosis not present

## 2023-12-23 DIAGNOSIS — E039 Hypothyroidism, unspecified: Secondary | ICD-10-CM | POA: Diagnosis not present

## 2023-12-23 DIAGNOSIS — N1831 Chronic kidney disease, stage 3a: Secondary | ICD-10-CM | POA: Diagnosis not present

## 2023-12-23 DIAGNOSIS — E785 Hyperlipidemia, unspecified: Secondary | ICD-10-CM | POA: Diagnosis not present

## 2023-12-23 DIAGNOSIS — I1310 Hypertensive heart and chronic kidney disease without heart failure, with stage 1 through stage 4 chronic kidney disease, or unspecified chronic kidney disease: Secondary | ICD-10-CM | POA: Diagnosis not present

## 2023-12-23 DIAGNOSIS — E1122 Type 2 diabetes mellitus with diabetic chronic kidney disease: Secondary | ICD-10-CM | POA: Diagnosis not present

## 2023-12-23 DIAGNOSIS — I700 Atherosclerosis of aorta: Secondary | ICD-10-CM | POA: Insufficient documentation

## 2023-12-23 DIAGNOSIS — I129 Hypertensive chronic kidney disease with stage 1 through stage 4 chronic kidney disease, or unspecified chronic kidney disease: Secondary | ICD-10-CM | POA: Diagnosis not present

## 2023-12-23 HISTORY — PX: TRANSESOPHAGEAL ECHOCARDIOGRAM (CATH LAB): EP1270

## 2023-12-23 LAB — ECHO TEE
MV M vel: 6.12 m/s
MV Peak grad: 149.8 mmHg
Radius: 0.7 cm

## 2023-12-23 SURGERY — TRANSESOPHAGEAL ECHOCARDIOGRAM (TEE) (CATHLAB)
Anesthesia: Monitor Anesthesia Care

## 2023-12-23 MED ORDER — SODIUM CHLORIDE 0.9% FLUSH: 3.0000 mL | INTRAVENOUS | Status: DC | PRN

## 2023-12-23 MED ORDER — PROPOFOL 10 MG/ML IV BOLUS
INTRAVENOUS | Status: DC | PRN
  Administered 2023-12-23: 60 mg via INTRAVENOUS
  Administered 2023-12-23 (×5): 20 mg via INTRAVENOUS

## 2023-12-23 MED ORDER — SODIUM CHLORIDE 0.9% FLUSH
3.0000 mL | Freq: Two times a day (BID) | INTRAVENOUS | Status: DC
Start: 2023-12-23 — End: 2023-12-23

## 2023-12-23 MED ORDER — SODIUM CHLORIDE 0.9 % IV SOLN: INTRAVENOUS | Status: DC | PRN

## 2023-12-23 MED ORDER — LIDOCAINE 2% (20 MG/ML) 5 ML SYRINGE
INTRAMUSCULAR | Status: DC | PRN
  Administered 2023-12-23: 100 mg via INTRAVENOUS

## 2023-12-23 MED ORDER — METOPROLOL SUCCINATE ER 50 MG PO TB24: 50.0000 mg | ORAL_TABLET | Freq: Every day | ORAL | 11 refills | Status: DC

## 2023-12-23 NOTE — Progress Notes (Signed)
 Echocardiogram Echocardiogram Transesophageal has been performed.  [name] 12/23/2023, 8:30 AM

## 2023-12-23 NOTE — Telephone Encounter (Signed)
 Please review and advise.

## 2023-12-23 NOTE — Progress Notes (Signed)
 Echocardiogram Echocardiogram Transesophageal has been performed.  [name] 12/23/2023, 8:29 AM

## 2023-12-23 NOTE — Telephone Encounter (Signed)
 Please forward to Dr. Leola Raisin nurse for scheduling based on when he would like to see her and if necessary cancel the appointment with Caitlin.

## 2023-12-23 NOTE — Telephone Encounter (Signed)
 New Message:     Patient would like to switch from Dr Veryl Gottron to Dr Alvis Ba. Is this alright with you?

## 2023-12-23 NOTE — Anesthesia Preprocedure Evaluation (Signed)
 Anesthesia Evaluation  Patient identified by MRN, date of birth, ID band Patient awake    Reviewed: Allergy & Precautions, H&P , NPO status , Patient's Chart, lab work & pertinent test results  History of Anesthesia Complications (+) PONV and history of anesthetic complications  Airway Mallampati: II  TM Distance: <3 FB Neck ROM: Full    Dental no notable dental hx.    Pulmonary neg pulmonary ROS   Pulmonary exam normal breath sounds clear to auscultation       Cardiovascular hypertension, Pt. on medications Normal cardiovascular exam+ Valvular Problems/Murmurs MR  Rhythm:Regular Rate:Normal     Neuro/Psych negative neurological ROS  negative psych ROS   GI/Hepatic negative GI ROS, Neg liver ROS,,,  Endo/Other  negative endocrine ROS    Renal/GU negative Renal ROS  negative genitourinary   Musculoskeletal negative musculoskeletal ROS (+)    Abdominal   Peds negative pediatric ROS (+)  Hematology negative hematology ROS (+)   Anesthesia Other Findings   Reproductive/Obstetrics negative OB ROS                             Anesthesia Physical Anesthesia Plan  ASA: II  Anesthesia Plan: MAC   Post-op Pain Management: Minimal or no pain anticipated   Induction: Intravenous  PONV Risk Score and Plan: 3 and Propofol  infusion and Treatment may vary due to age or medical condition  Airway Management Planned: Nasal Cannula  Additional Equipment:   Intra-op Plan:   Post-operative Plan:   Informed Consent: I have reviewed the patients History and Physical, chart, labs and discussed the procedure including the risks, benefits and alternatives for the proposed anesthesia with the patient or authorized representative who has indicated his/her understanding and acceptance.       Plan Discussed with: CRNA and Surgeon  Anesthesia Plan Comments:         Anesthesia Quick  Evaluation

## 2023-12-23 NOTE — Transfer of Care (Signed)
 Immediate Anesthesia Transfer of Care Note  Patient: [name]  Procedure(s) Performed: TRANSESOPHAGEAL ECHOCARDIOGRAM  Patient Location: Cath Lab  Anesthesia Type:MAC  Level of Consciousness: awake, alert , and oriented  Airway & Oxygen Therapy: Patient Spontanous Breathing  Post-op Assessment: Report given to RN, Post -op Vital signs reviewed and stable, Patient moving all extremities X 4, and Patient able to stick tongue midline  Post vital signs: Reviewed and stable  Last Vitals:  Vitals Value Taken Time  BP 99/61   Temp 98.6   Pulse 77 12/23/23 0820  Resp 14 12/23/23 0820  SpO2 94 % 12/23/23 0820  Vitals shown include unfiled device data.  Last Pain:  Vitals:   12/23/23 0655  TempSrc: Temporal         Complications: No notable events documented.

## 2023-12-23 NOTE — Telephone Encounter (Signed)
 Pt c/o medication issue: 1. Name of Medication:  Hydaralazine to 50 mg   2. How are you currently taking this  medication (dosage and times per day)?  Twice daily   3. Are you having a reaction (difficulty breathing--STAT)?    4. What is your medication issue?  Hospital doctor advised to stop Hydralazine  and to start on Metoprolol  50 MG once daily. Patient + daughter would like to know if Dr. Veryl Gottron agrees with this. Please advise.  Pt c/o BP issue: STAT if pt c/o blurred vision, one-sided weakness or slurred speech.   1. What is your BP concern?  Daughter called to report BP readings.  2. Have you taken any BP medication today? No   3. What are your last 5 BP readings?  5/13: 88/52 89 sitting         111/67 100 standing  5/14: 129/83 82 sitting          117/76 91 standing  5/15: 144/83 87 sitting           124/77 873 standing  5/16: 127/74 82 sitting          105/66 85 standing   5/17: 113/71 88 sitting          121/78 94 standing  5/18: 114/66 92 sitting          120/73 92 standing   4. Are you having any other symptoms (ex. Dizziness, headache, blurred vision, passed out)?  "Just not feeling right," per daughter. Daughter says symptoms haven't improved since last appointment but she doesn't

## 2023-12-23 NOTE — Interval H&P Note (Signed)
 History and Physical Interval Note:  12/23/2023 7:52 AM  [name]  has presented today for surgery, with the diagnosis of MITRAL VALVE PROLAPSE.  The various methods of treatment have been discussed with the patient and family. After consideration of risks, benefits and other options for treatment, the patient has consented to  Procedure(s): TRANSESOPHAGEAL ECHOCARDIOGRAM (N/A) as a surgical intervention.  The patient's history has been reviewed, patient examined, no change in status, stable for surgery.  I have reviewed the patient's chart and labs.  Questions were answered to the patient's satisfaction.     Derald Lorge

## 2023-12-23 NOTE — Anesthesia Postprocedure Evaluation (Signed)
 Anesthesia Post Note  Patient: [name]  Procedure(s) Performed: TRANSESOPHAGEAL ECHOCARDIOGRAM     Patient location during evaluation: PACU Anesthesia Type: MAC Level of consciousness: awake and alert Pain management: pain level controlled Vital Signs Assessment: post-procedure vital signs reviewed and stable Respiratory status: spontaneous breathing, nonlabored ventilation and respiratory function stable Cardiovascular status: blood pressure returned to baseline and stable Postop Assessment: no apparent nausea or vomiting Anesthetic complications: no   No notable events documented.  Last Vitals:  Vitals:   12/23/23 0830 12/23/23 0840  BP: (!) 124/59 116/72  Pulse: 77 75  Resp: 18 20  Temp:    SpO2: 96% 96%    Last Pain:  Vitals:   12/23/23 0840  TempSrc:   PainSc: 0-No pain                 Earvin Goldberg

## 2023-12-23 NOTE — Telephone Encounter (Signed)
 S/w pt is per MSW. TEE was this morning, she does not need surgery. Needs to see Dr. Veryl Gottron within next 2-3 mo. Pt is waiting for a response about medication, bp readings and switching providers. Stated cannot do anything till provider gives a response. Pt already had appt in epic for Jennefer Moats on June 2. Will send to Manuella Seller to [place].

## 2023-12-23 NOTE — Op Note (Addendum)
 INDICATIONS: MVP  PROCEDURE:   Informed consent was obtained prior to the procedure. The risks, benefits and alternatives for the procedure were discussed and the patient comprehended these risks.  Risks include, but are not limited to, cough, sore throat, vomiting, nausea, somnolence, esophageal and stomach trauma or perforation, bleeding, low blood pressure, aspiration, pneumonia, infection, trauma to the teeth and death.    After a procedural time-out, the oropharynx was anesthetized with 20% benzocaine spray.   During this procedure the patient was administered IV propofol  by Anesthesiology, Dr. Annabell Key The transesophageal probe was inserted in the esophagus and stomach without difficulty and multiple views were obtained.  The patient was kept under observation until the patient left the procedure room.  The patient left the procedure room in stable condition.   Agitated microbubble saline contrast was not administered.  COMPLICATIONS:    There were no immediate complications.  FINDINGS:  Hyperdynamic left ventricular function,  EF >75%.   Severe myxomatous MV changes (Barlow syndrome). There is severe prolapse of the middle scallop of the posterior mitral leaflet, but there are no ruptured chordae. There is moderate to severe MR. There is forward systolic flow in the right and left upper pulmonary veins (systolic dominant on the left). There is severe systolic anterior motion of the redundant anterior mitral leaflet with LV outflow obstruction (approximately 30 mm Hg peak gradient at rest).  RECOMMENDATIONS:    Beta blocker therapy, monitor response with TTE. Stopped hydralazine , prescribed metoprolol  succinate 50 mg daily.  Time Spent Directly with the Patient:  30 minutes   [name] 12/23/2023, 8:15 AM

## 2023-12-23 NOTE — Telephone Encounter (Signed)
 Returned call to patient, reviewed recommendations with patient. Patient states "that doesn't answer my question" Asked patient what her question is. Patient states she doesn't know when to stop her hydralazine  and start the metoprolol . Advised taking normal doses of hydralazine  and start metoprolol  tomorrow.   Advised Dr. Veryl Gottron approved provider switch and we are waiting for Dr. Tita Form to approve. Advised pt to keep 6/2 hosp fu appointment.      "Yes, I discussed with the doctor who did her procedure. We can go into more detail at follow up, but the metoprolol  will help to make the mitral valve less leaky. Her blood pressures are normal to low, so I agree with stopping the hydralazine  for now and monitoring her BP. We can make further adjustments at her follow up visit."

## 2023-12-23 NOTE — Discharge Instructions (Signed)

## 2023-12-24 ENCOUNTER — Other Ambulatory Visit (HOSPITAL_COMMUNITY): Payer: Self-pay | Admitting: Internal Medicine

## 2023-12-24 DIAGNOSIS — R011 Cardiac murmur, unspecified: Secondary | ICD-10-CM | POA: Diagnosis not present

## 2023-12-24 DIAGNOSIS — I340 Nonrheumatic mitral (valve) insufficiency: Secondary | ICD-10-CM | POA: Diagnosis not present

## 2023-12-24 DIAGNOSIS — M25511 Pain in right shoulder: Secondary | ICD-10-CM | POA: Diagnosis not present

## 2023-12-24 NOTE — Telephone Encounter (Signed)
 Ok with me

## 2023-12-25 LAB — BASIC METABOLIC PANEL WITH GFR
BUN/Creatinine Ratio: 18 (ref 12–28)
BUN: 15 mg/dL (ref 8–27)
CO2: 21 mmol/L (ref 20–29)
Calcium: 9.7 mg/dL (ref 8.7–10.3)
Chloride: 100 mmol/L (ref 96–106)
Creatinine, Ser: 0.82 mg/dL (ref 0.57–1.00)
Glucose: 100 mg/dL — ABNORMAL HIGH (ref 70–99)
Potassium: 4.4 mmol/L (ref 3.5–5.2)
Sodium: 137 mmol/L (ref 134–144)
eGFR: 70 mL/min/{1.73_m2} (ref >59)

## 2023-12-26 ENCOUNTER — Other Ambulatory Visit: Payer: Self-pay | Admitting: Medical Genetics

## 2023-12-26 ENCOUNTER — Ambulatory Visit

## 2023-12-26 NOTE — Telephone Encounter (Signed)
 Spoke with pt, aware to keep appointment per dr croitoru. Follow up scheduled in 3 months with dr c.

## 2023-12-26 NOTE — Telephone Encounter (Signed)
 She should keep the appt with Caitlin to see if the medication changes are helping. Likely meds will need to be adjusted further and I do not have any appointment availability in the next several weeks.. Please make an appt with me in 3 months.

## 2024-01-02 ENCOUNTER — Ambulatory Visit
Admission: RE | Admit: 2024-01-02 | Discharge: 2024-01-02 | Discharge status: Home / Self Care | Source: Ambulatory Visit | Attending: Obstetrics & Gynecology | Admitting: Obstetrics & Gynecology

## 2024-01-02 DIAGNOSIS — Z1231 Encounter for screening mammogram for malignant neoplasm of breast: Secondary | ICD-10-CM | POA: Diagnosis not present

## 2024-01-06 ENCOUNTER — Ambulatory Visit (HOSPITAL_BASED_OUTPATIENT_CLINIC_OR_DEPARTMENT_OTHER): Admitting: Family

## 2024-01-06 ENCOUNTER — Encounter (HOSPITAL_BASED_OUTPATIENT_CLINIC_OR_DEPARTMENT_OTHER): Payer: Self-pay

## 2024-01-06 ENCOUNTER — Telehealth: Payer: Self-pay | Admitting: Cardiology

## 2024-01-06 ENCOUNTER — Encounter (HOSPITAL_BASED_OUTPATIENT_CLINIC_OR_DEPARTMENT_OTHER): Payer: Self-pay | Admitting: Nurse Practitioner

## 2024-01-06 ENCOUNTER — Ambulatory Visit (HOSPITAL_BASED_OUTPATIENT_CLINIC_OR_DEPARTMENT_OTHER): Admitting: Nurse Practitioner

## 2024-01-06 VITALS — BP 170/84 | HR 61 | Ht 63.0 in | Wt 132.0 lb

## 2024-01-06 DIAGNOSIS — E871 Hypo-osmolality and hyponatremia: Secondary | ICD-10-CM | POA: Diagnosis not present

## 2024-01-06 DIAGNOSIS — I10 Essential (primary) hypertension: Secondary | ICD-10-CM

## 2024-01-06 DIAGNOSIS — I5181 Takotsubo syndrome: Secondary | ICD-10-CM | POA: Diagnosis not present

## 2024-01-06 DIAGNOSIS — I341 Nonrheumatic mitral (valve) prolapse: Secondary | ICD-10-CM | POA: Diagnosis not present

## 2024-01-06 MED ORDER — CARVEDILOL 6.25 MG PO TABS: 6.2500 mg | ORAL_TABLET | Freq: Two times a day (BID) | ORAL | 3 refills | Status: DC

## 2024-01-06 NOTE — Patient Instructions (Addendum)
 Medication Instructions:   DISCONTINUE Metoprolol .   START Coreg  one (1) tablet by mouth ( 6.25 mg) twice daily.   *If you need a refill on your cardiac medications before your next appointment, please call your pharmacy*  Lab Work:  None ordered.   If you have labs (blood work) drawn today and your tests are completely normal, you will receive your results only by: MyChart Message (if you have MyChart) OR A paper copy in the mail If you have any lab test that is abnormal or we need to change your treatment, we will call you to review the results.  Testing/Procedures:  None ordered.  Follow-Up: At [facility], you and your health needs are our priority.  As part of our continuing mission to provide you with exceptional heart care, our providers are all part of one team.  This team includes your primary Cardiologist (physician) and Advanced Practice Providers or APPs (Physician Assistants and Nurse Practitioners) who all work together to provide you with the care you need, when you need it.  Your next appointment:   3 month(s)  Provider:   Dr. Alvis Ba.    We recommend signing up for the patient portal called "MyChart".  Sign up information is provided on this After Visit Summary.  MyChart is used to connect with patients for Virtual Visits (Telemedicine).  Patients are able to view lab/test results, encounter notes, upcoming appointments, etc.  Non-urgent messages can be sent to your provider as well.   To learn more about what you can do with MyChart, go to [email or web address].   Other Instructions  I will call you in one week to discuss Blood pressures.   If your BP is (160) systolic on the (top) If your BP is (88) diastolic on the ( bottom) On 3 (three) occasions consecutively that are 3 hours apart or 2 hours after medication send mychart message.

## 2024-01-06 NOTE — Telephone Encounter (Signed)
 Pt c/o BP issue: STAT if pt c/o blurred vision, one-sided weakness or slurred speech.  STAT if BP is GREATER than 180/120 TODAY.  STAT if BP is LESS than 90/60 and SYMPTOMATIC TODAY  1. What is your BP concern? Pt called in stating her bp has been high   2. Have you taken any BP medication today? No   3. What are your last 5 BP readings?  151/83 - 5/29 149/81 - 5/30 156/77 -5/31  193/89 -6/1 154/99    4. Are you having any other symptoms (ex. Dizziness, headache, blurred vision, passed out)? Fatigue

## 2024-01-06 NOTE — Progress Notes (Signed)
 Cardiology Office Note:  .   Date:  01/06/2024  ID:  [name], DOB [birth year], MRN [id number] PCP: Alexis Man, MD  [facility] Providers Cardiologist:  Alexis Donning, MD    Patient Profile: .      PMH Takotsubo cardiomyopathy in 2014 Mitral valve prolapse Mitral regurgitation Hyperlipidemia Aortic atherosclerosis Hypertension  Tachycardia cardiomyopathy 2014 due to Graves' disease which was treated with RAI. Echo 2014 with normal LVEF, severe pulmonary hypertension (RVSP 63 mmHg).  Repeat echo improvement in PA pressure to normal 25 mmHg, moderate MVP, mild MR.  Echo 04/2020 LVEF 60 to 65%, mild LVH, mild MR, normal PASP, mild TR.  TTE 03/2023 normal LVEF, mild to moderate MR, moderate MVP which was stable from previous.  She had isolated PAF in the setting of Graves' disease and Takotsubo cardiomyopathy.  Seen 10/24/2023 by Alexis Banks, NP.  Hydralazine  dose had been increased since last visit.  She was having more issues with her back pain and MRI did reveal a bone spur.  She attributed some of her elevated BP readings to pain and had been less active.  Average BP from home log 126/75.  She is not having any concerning cardiac symptoms and July follow-up with Dr. Veryl [name] was advised.   Admission 12/13/2023 with near syncope thought to be secondary to hyponatremia.  She had lightheadedness over the few days prior to admission.  She had been feeling badly for a few days PTA with loose stools, lower back pain, and lightheadedness.  Sodium was 126 on arrival.  Spironolactone  was held.  Seen by me on 12/16/23 for hospital follow-up, accompanied by  her daughter, [name]. She continued to feel fatigued. Reported that PTA, she bent over and nearly passed out from extreme lightheadedness. Felt a "vice-like" tightening in her head. Home BP has been labile. Reported a sensation of her heart racing, with HR sometimes reaching the low hundreds, which is uncomfortable. No  episodes of significant highs or lows on Apple watch. She has had a murmur for years, secondary to MVP and MR, however ED physician suggested to her that it was quite significant. Echo 03/2023 revealed moderate mitral valve prolapse with mild to moderate MR. She reports good hydration, drinking about three glasses of water a day, along with a small amount of coffee and tea, and has recently started drinking electrolyte beverages. She is a poor historian, with assistance provided  by her daughter.   Echo was obtained on 12/17/2023 which revealed potentially worsening MVP.  She was subsequently scheduled for TEE on 12/23/2023 with [name] which revealed severe myxomatous MV changes (Barlow syndrome) with severe prolapse of middle scallop of posterior mitral leaflet, but no ruptured chordae, moderate to severe MR, severe systolic anterior motion of redundant anterior mitral leaflet with LV outflow obstruction (approximately 30 mmHg peak gradient at rest). Hydralazine  was stopped and she was started on metoprolol  succinate 50 mg daily.        History of Present Illness: .    History of Present Illness [name] is a pleasant 83 year old female who is here today for follow-up of hypertension. Since undergoing TEE on 5/19 and starting metoprolol  in the place of hydralazine , she has had elevated blood pressure readings. Home BP has been consistently > 150/80 mmHg with no reported readings < 130 mmHg. She has been on various antihypertensive medications, including carvedilol , enalapril , and losartan . She is pleased with the improvement in HR since starting beta-blocker.  She was previously on  carvedilol  but it was discontinued during the time that Graves' disease was diagnosed.  She was having hypertension and variations and HR at that time. She reports diet is generally low in salt, with occasional indulgences like chips. She consumes minimal caffeine, typically one cup of coffee per day. No recent  dietary changes that she feels are related to elevated blood pressure. Occasional lightheadedness but no presyncope or syncope. She reports not feeling well and had poor sleep the previous night. She denies chest pain, shortness of breath, orthopnea, PND, and edema.  Discussed the use of AI scribe software for clinical note transcription with the patient, who gave verbal consent to proceed.   ROS: See HPI       Studies Reviewed: [name]        No results found for: "LIPOA"   Risk Assessment/Calculations:     HYPERTENSION CONTROL Vitals:   01/06/24 1503 01/06/24 1559  BP: (!) 164/78 (!) 170/84    The patient's blood pressure is elevated above target today.  In order to address the patient's elevated BP: A current anti-hypertensive medication was adjusted today.          Physical Exam:   VS:  BP (!) 170/84   Pulse 61   Ht 5\' 3"  (1.6 m)   Wt 132 lb (59.9 kg)   SpO2 92%   BMI 23.38 kg/m    Wt Readings from Last 3 Encounters:  01/06/24 132 lb (59.9 kg)  12/16/23 129 lb (58.5 kg)  12/12/23 133 lb 9.6 oz (60.6 kg)    GEN: Well nourished, well developed in no acute distress NECK: No JVD; No carotid bruits CARDIAC: RRR, 3/6 holosystolic murmur. No rubs, gallops RESPIRATORY:  Clear to auscultation without rales, wheezing or rhonchi  ABDOMEN: Soft, non-tender, non-distended EXTREMITIES:  No edema; No deformity     ASSESSMENT AND PLAN: .    Assessment & Plan Hypertension   History of orthostatic hypotension.  Spironolactone  was discontinued at last office visit 12/16/2023.  BP has been elevated since TEE on 12/23/23 at which time hydralazine  was discontinued and patient was started on metoprolol  succinate.  BP is elevated in clinic and remains elevated on my recheck.  We will discontinue metoprolol  and start carvedilol  6.25 mg twice daily.  I have given her parameters for home monitoring and we will call in 1 week to assess BP response.  Advised her to call sooner with concerns.   Maintain good hydration and limit salt intake.  Renal function stable on labs completed 12/24/2023. Continue amlodipine , olmesartan .  Mitral  Valve Disorder   History of mitral valve prolapse with recent TEE that revealed severe myxomatous MV (Barlow syndrome), with severe prolapse of the middle scallop of the posterior mitral leaflet, no ruptured chordae, moderate to severe MR.  Recommendation to start beta-blocker therapy and monitor response with TTE. She reports improvement in HR on beta blocker. We are changing metoprolol  to carvedilol  as noted above we will monitor response with soon follow-up.   Takotsubo cardiomyopathy History of cardiomyopathy due to acute exacerbation of Graves' disease. Echo 12/17/23 reveals normal LVEF. She denies chest pain, shortness of breath, orthopnea, PND, or edema. She appears euvolemic on exam.  Continue GDMT including BB, olmesartan .   Hyponatremia   History of hyponatremia in the setting of diuretic use.  BMET 12/24/2023 reveals sodium 137 mmol/L. She is no longer on diuretic therapy. Continue drinking electrolyte water.          Disposition: 3 months with [name]  Signed, Slater Duncan, NP-C

## 2024-01-06 NOTE — Telephone Encounter (Signed)
 Called pt, scheduled pt for today at 1510 with Swinyer, NP to discuss hosp fu and BP

## 2024-01-07 ENCOUNTER — Encounter: Payer: Self-pay | Admitting: Cardiovascular Disease

## 2024-01-07 NOTE — Telephone Encounter (Signed)
 FYI

## 2024-01-13 NOTE — Telephone Encounter (Signed)
Follow up questions?  

## 2024-01-13 NOTE — Telephone Encounter (Signed)
BP log as requested 

## 2024-01-13 NOTE — Telephone Encounter (Signed)
 Pt c/o BP issue: STAT if pt c/o blurred vision, one-sided weakness or slurred speech.  STAT if BP is GREATER than 180/120 TODAY.  STAT if BP is LESS than 90/60 and SYMPTOMATIC TODAY  1. What is your BP concern? HR at rest not being where MD is wanting it  2. Have you taken any BP medication today? Yes  3. What are your last 5 BP readings? 01/07/24 10:00 PM 132/72 HR 67 01/08/24 1:30 PM 126/83 HR 71 10:00 PM 130/78 HR 71 01/09/24 10:30 AM 152/95 HR 69 10:00 PM 115/70 HR 80 01/10/24 11:30 AM 158/106 HR 66 5:30 PM 167/90 HR 72 9:30 PM 149/89 HR 80 01/11/24 10:30 AM 141/87 HR 67 12:00 PM 145/89 HR 64 2:30 PM 159/82 HR 80 5:15 PM 164/95 HR 72 9:30 PM 134/84 HR 82  01/12/24 11:00 AM 129/92 HR 72 9:30 PM 160/78 HR 77  01/13/24 10:30 AM 144/92 HR 71  4. Are you having any other symptoms (ex. Dizziness, headache, blurred vision, passed out)? Still doesn't feel good

## 2024-01-20 ENCOUNTER — Telehealth: Payer: Self-pay | Admitting: Cardiovascular Disease

## 2024-01-20 DIAGNOSIS — H00012 Hordeolum externum right lower eyelid: Secondary | ICD-10-CM | POA: Diagnosis not present

## 2024-01-20 NOTE — Telephone Encounter (Signed)
 Spoke with patient and she was calling to give BP readings as requested.  Please see readings below

## 2024-01-20 NOTE — Telephone Encounter (Signed)
 Pt c/o BP issue: STAT if pt c/o blurred vision, one-sided weakness or slurred speech.   1. What is your BP concern?  Patient is calling to report BP readings.  2. Have you taken any BP medication today? No   3. What are your last 5 BP readings? 6/09: 144/72 73 PM + just increased medication 6/10: 142/86 62 AM, 146/82 78 PM 6/11: 147/85 59 PM, 120/70 78 PM 6/12: 143/88 63 AM, 133/70 73 PM 6/13: 164/99 64 AM, 135/77 78 PM 6/14: 160/97 60 PM 6/15: 140/82 59 AM, 115/69 74 PM   4. Are you having any other symptoms (ex. Dizziness, headache, blurred vision, passed out)?  No

## 2024-01-21 ENCOUNTER — Encounter (HOSPITAL_BASED_OUTPATIENT_CLINIC_OR_DEPARTMENT_OTHER): Payer: Self-pay

## 2024-01-21 ENCOUNTER — Ambulatory Visit (HOSPITAL_BASED_OUTPATIENT_CLINIC_OR_DEPARTMENT_OTHER): Admitting: Family

## 2024-01-22 ENCOUNTER — Encounter (HOSPITAL_BASED_OUTPATIENT_CLINIC_OR_DEPARTMENT_OTHER): Payer: Self-pay

## 2024-01-22 ENCOUNTER — Other Ambulatory Visit

## 2024-01-23 MED ORDER — AMLODIPINE BESYLATE 10 MG PO TABS: 10.0000 mg | ORAL_TABLET | Freq: Every day | ORAL | 3 refills | Status: DC

## 2024-01-23 NOTE — Telephone Encounter (Signed)
 Update from yesterday's message thread- please advise on next steps

## 2024-01-23 NOTE — Telephone Encounter (Signed)
 Called pt as requested-   Per the patient she states she has been on Hydralazine  for a while at 250mg  daily- this caused the damage to her left ventricle. The diuretic took too many of her electrolytes and caused her to pass out. So she states she is too scare to take hydralazine  again.   She states she doesn't believe that Dr. Maximo Spar read her chart at all before making recommendations. Advised patient if she does not like the recommendations provided she will need to wait until Dr. Tita Form returns for further review.

## 2024-01-23 NOTE — Telephone Encounter (Signed)
 Addressed on MyChart encounter- Amlodipine  increased to 10 mg daily

## 2024-01-25 ENCOUNTER — Encounter: Payer: Self-pay | Admitting: Neurology

## 2024-01-27 ENCOUNTER — Other Ambulatory Visit: Payer: Self-pay | Admitting: *Deleted

## 2024-01-27 MED ORDER — NONFORMULARY OR COMPOUNDED ITEM: 1.0000 g | Freq: Four times a day (QID) | 5 refills | Status: AC

## 2024-01-27 NOTE — Telephone Encounter (Signed)
 compound prescription  order signed by provider  and faxed this afternoon

## 2024-01-28 NOTE — Telephone Encounter (Signed)
 Is the blood pressure log and a piece of paper at the office?  Can we scan it in so I can look at it?

## 2024-01-28 NOTE — Telephone Encounter (Signed)
 I should see her in the office.  Can we please add her on the end of the schedule next Wednesday?

## 2024-01-29 NOTE — Telephone Encounter (Signed)
 Yes please

## 2024-02-03 ENCOUNTER — Ambulatory Visit (INDEPENDENT_AMBULATORY_CARE_PROVIDER_SITE_OTHER): Payer: PPO | Admitting: Obstetrics & Gynecology

## 2024-02-03 ENCOUNTER — Encounter (HOSPITAL_BASED_OUTPATIENT_CLINIC_OR_DEPARTMENT_OTHER): Payer: Self-pay

## 2024-02-03 VITALS — BP 166/72 | HR 69 | Wt 129.8 lb

## 2024-02-03 DIAGNOSIS — Z87410 Personal history of cervical dysplasia: Secondary | ICD-10-CM

## 2024-02-03 DIAGNOSIS — I349 Nonrheumatic mitral valve disorder, unspecified: Secondary | ICD-10-CM

## 2024-02-03 DIAGNOSIS — M810 Age-related osteoporosis without current pathological fracture: Secondary | ICD-10-CM | POA: Diagnosis not present

## 2024-02-03 DIAGNOSIS — Z01419 Encounter for gynecological examination (general) (routine) without abnormal findings: Secondary | ICD-10-CM

## 2024-02-03 DIAGNOSIS — Z780 Asymptomatic menopausal state: Secondary | ICD-10-CM

## 2024-02-03 NOTE — Progress Notes (Unsigned)
 Breast and Pelvic exam Patient name: [name] MRN [id number]  Date of birth: [birth year] Chief Complaint:   Breast and Pelvic exam  History of Present Illness:   [name] is a 83 y.o. G22P3001 Caucasian female being seen today for breast and pelvic exam.  Denies vaginal bleeding.  Has been having issues with hypertension.  Recent echo showed worsening mitral value issues.  Has follow up with Dr. Francyne on Wednesday.    Using miralax  daily.  This has helped with constipation.    No LMP recorded. Patient is postmenopausal.  Last pap 2023. Results were: negative.  Last mammogram: 01/02/2024. Results were: normal.  Last colonoscopy: 2023. 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 Dexa:  2020.  Pt reports has had one more recent than this.     01/30/2022    2:52 PM 12/01/2020    3:26 PM 11/25/2018    5:42 PM 07/13/2013   12:19 PM  Depression screen PHQ 2/9  Decreased Interest 0 0 0 0  Down, Depressed, Hopeless 0 0 0 0  PHQ - 2 Score 0 0 0 0    Review of Systems:   Pertinent items are noted in HPI Denies any recent bowel or bladder changes.  Denies pelvic pain. Pertinent History Reviewed:  Reviewed past medical,surgical, social and family history.  Reviewed problem list, medications and allergies. Physical Assessment:   Vitals:   02/03/24 1500  BP: (!) 166/72  Pulse: 69  SpO2: 100%  Weight: 129 lb 12.8 oz (58.9 kg)  Body mass index is 22.99 kg/m.        Physical Examination:   General appearance - well appearing, and in no distress  Mental status - alert, oriented to person, place, and time  Psych:  She has a normal mood and affect  Skin - warm and dry, normal color, no suspicious lesions noted  Chest - effort normal, all lung fields clear to auscultation bilaterally  Heart - normal rate and regular rhythm, 2/6 holosystolic murmur noted at right apical region  Neck:  midline trachea, no thyromegaly or nodules  Breasts - breasts appear normal, no suspicious  masses, no skin or nipple changes or  axillary nodes  Abdomen - soft, nontender, nondistended, no masses or organomegaly  Pelvic - VULVA: normal appearing vulva with no masses, tenderness or lesions   VAGINA: normal appearing vagina with normal color and discharge, no lesions   CERVIX: normal appearing cervix without discharge or lesions, no CMT  Thin prep pap is not indicated.  UTERUS: uterus is felt to be normal size, shape, consistency and nontender   ADNEXA: No adnexal masses or tenderness noted.  Rectal - normal rectal, good sphincter tone, no masses felt.   Extremities:  No swelling or varicosities noted  Chaperone present for exam  Assessment & Plan:  1. Encntr for gyn exam (general) (routine) w/o abn findings (Primary) - Pap smear negative 2023.  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  Will not repeat unless new symtpoms - Bone mineral density 2020 but pt reports has had more recently - lab work done with PCP, Dr. Clarice - vaccines reviewed/updated  2. Postmenopausal  3. 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 Age-related osteoporosis without current pathological fracture - followed by Dr. Clarice and on Prolia  - reviewed calcium  and Vit D dosages  5. Non-rheumatic mitral valve disease - has appt on 7/2 with Dr. Zenon   No orders of the  defined types were placed in this encounter.   Meds: No orders of the defined types were placed in this encounter.   Follow-up: Return for 1-2years if pt desires or with new concerns.  Ronal GORMAN Pinal, MD 02/06/2024 8:15 PM

## 2024-02-05 ENCOUNTER — Ambulatory Visit: Attending: Cardiovascular Disease | Admitting: Cardiovascular Disease

## 2024-02-05 ENCOUNTER — Encounter: Payer: Self-pay | Admitting: Cardiovascular Disease

## 2024-02-05 VITALS — BP 162/85 | HR 57 | Ht 63.0 in | Wt 130.6 lb

## 2024-02-05 DIAGNOSIS — I5189 Other ill-defined heart diseases: Secondary | ICD-10-CM | POA: Diagnosis not present

## 2024-02-05 DIAGNOSIS — I340 Nonrheumatic mitral (valve) insufficiency: Secondary | ICD-10-CM | POA: Diagnosis not present

## 2024-02-05 DIAGNOSIS — E7800 Pure hypercholesterolemia, unspecified: Secondary | ICD-10-CM | POA: Diagnosis not present

## 2024-02-05 DIAGNOSIS — E54 Ascorbic acid deficiency: Secondary | ICD-10-CM

## 2024-02-05 DIAGNOSIS — I10 Essential (primary) hypertension: Secondary | ICD-10-CM

## 2024-02-05 DIAGNOSIS — M5416 Radiculopathy, lumbar region: Secondary | ICD-10-CM | POA: Diagnosis not present

## 2024-02-05 MED ORDER — CARVEDILOL 12.5 MG PO TABS: 12.5000 mg | ORAL_TABLET | Freq: Two times a day (BID) | ORAL | 3 refills | Status: DC

## 2024-02-05 MED ORDER — HYDROCHLOROTHIAZIDE 12.5 MG PO CAPS: 12.5000 mg | ORAL_CAPSULE | Freq: Every day | ORAL | 3 refills | Status: DC

## 2024-02-05 NOTE — Patient Instructions (Addendum)
 Medication Instructions:  Hydrochlorothiazide  12.5 Mg daily Carvedilol  12.5 mg twice a day  *If you need a refill on your cardiac medications before your next appointment, please call your pharmacy*  Lab Work: None ordered If you have labs (blood work) drawn today and your tests are completely normal, you will receive your results only by: MyChart Message (if you have MyChart) OR A paper copy in the mail If you have any lab test that is abnormal or we need to change your treatment, we will call you to review the results.  Testing/Procedures: Your physician has requested that you have an echocardiogram in November. Echocardiography is a painless test that uses sound waves to create images of your heart. It provides your doctor with information about the size and shape of your heart and how well your heart's chambers and valves are working. This procedure takes approximately one hour. There are no restrictions for this procedure. Please do NOT wear cologne, perfume, aftershave, or lotions (deodorant is allowed). Please arrive 15 minutes prior to your appointment time.  Please note: We ask at that you not bring children with you during ultrasound (echo/ vascular) testing. Due to room size and safety concerns, children are not allowed in the ultrasound rooms during exams. Our front office staff cannot provide observation of children in our lobby area while testing is being conducted. An adult accompanying a patient to their appointment will only be allowed in the ultrasound room at the discretion of the ultrasound technician under special circumstances. We apologize for any inconvenience.   Follow-Up: At [facility], you and your health needs are our priority.  As part of our continuing mission to provide you with exceptional heart care, our providers are all part of one team.  This team includes your primary Cardiologist (physician) and Advanced Practice Providers or APPs (Physician  Assistants and Nurse Practitioners) who all work together to provide you with the care you need, when you need it.  Your next appointment:    In November= 4 months- needed after pt gets Echo  Provider:   Dr Francyne  We recommend signing up for the patient portal called MyChart.  Sign up information is provided on this After Visit Summary.  MyChart is used to connect with patients for Virtual Visits (Telemedicine).  Patients are able to view lab/test results, encounter notes, upcoming appointments, etc.  Non-urgent messages can be sent to your provider as well.   To learn more about what you can do with MyChart, go to [email or web address].

## 2024-02-06 ENCOUNTER — Telehealth: Payer: Self-pay | Admitting: Emergency Medicine

## 2024-02-06 ENCOUNTER — Encounter (HOSPITAL_BASED_OUTPATIENT_CLINIC_OR_DEPARTMENT_OTHER): Payer: Self-pay | Admitting: Obstetrics & Gynecology

## 2024-02-06 DIAGNOSIS — Z79899 Other long term (current) drug therapy: Secondary | ICD-10-CM

## 2024-02-06 NOTE — Progress Notes (Signed)
 Cardiology Office Note   Date:  02/06/2024  ID:  [name], [name] 10/17/1940, MRN [id number] PCP: Alexis Nottingham, MD  [facility] Providers Cardiologist:  Alexis Balding, MD     History of Present Illness [name] is a 83 y.o. female with a longstanding history hypertension, mitral valve prolapse with secondary mitral regurgitation, hypercholesterolemia,, remote history of Takotsubo cardiomyopathy transitioning care from Dr. Lonni.  She has recently had difficulty with blood pressure control due to medication side effects.  She felt poorly when she was prescribed hydralazine  with worsening fatigue, tachycardia and dyspnea.  Higher doses of amlodipine  have caused problems with lower extremity edema.  She has had problems off and on with orthostatic hypotension, most recently when treated with spironolactone .  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  There was moderate to severe mitral insufficiency..  At that point she was started on metoprolol  and hydralazine  was discontinued that she has had elevated blood pressure since.  Metoprolol  was replaced with carvedilol  for blood pressure control at her latest appointment 01/06/2024.  She continues treatment with amlodipine  5 mg daily, olmesartan  40 mg daily.    For many years she took hydrochlorothiazide  25 mg daily, but it appears this medication was discontinued due to hyponatremia.  Since last medication change she continues have issues with fatigue, but denies dyspnea on exertion.  She has not had orthopnea or PND and has trivial swelling limited to the left ankle (site of a previous  injury).  She has not had palpitations and is pleased that her heart rate is slower.  She denies dizziness or syncope.  Chest pain has not been a complaint.  Studies Reviewed     TEE 12/23/2023    1. There is subvalvular dynamic LV outflow obstruction due to systolic  anterior motion of the redundant anterior mitral leaflet. At rest the peak  outflow gradient is 30 mm Hg. Left ventricular ejection fraction, by  estimation, is 65 to 70%. The left  ventricle has hyperdynamic function. The left ventricle has no regional  wall motion abnormalities.   2. Right ventricular systolic function is normal. The right ventricular  size is normal.   3. Left atrial size was mildly dilated. No left atrial/left atrial  appendage thrombus was detected.   4. Barlow's syndrome. Both mitral leaflets are very redundant. The  anterior leaflet has moderate systolic anterior motion with left  ventricular outflow obstruction. Mitral regurgitation vena contracta 3 mm.  The effective regurgitant orifice area is 0.22   cm sq, regurgitant volume 26 ml, regurgitant fraction could not be  calculated due to LV outflow tract aliasing/obstruction. The mitral valve  is myxomatous. Moderate mitral valve regurgitation. No evidence of mitral  stenosis. There is severe holosystolic   prolapse of the middle scallop of the posterior leaflet of the mitral  valve. The mean mitral valve gradient is 1.4 mmHg with average heart rate  of 77 bpm.   5. The tricuspid valve is myxomatous. Tricuspid valve regurgitation is  moderate.   6. The aortic valve is tricuspid. There is mild thickening of the aortic  valve. Aortic valve regurgitation is not visualized. Aortic valve  sclerosis is present, with no evidence of aortic valve  stenosis.   7. There is mild (Grade II) protruding plaque involving the descending  aorta and aortic arch.   8. The right upper pulmonary vein is abnormal.   9. 3D performed of the mitral valve and demonstrates  severe prolapse of  the posterior leaflet and moderate systolic anterior motion of the  anterior leaflet.    Risk Assessment/Calculations   HYPERTENSION CONTROL Vitals:   02/05/24 0954 02/05/24 1038  BP: (!) 164/88 (!) 162/85    The patient's blood pressure is elevated above target today.  In order to address the patient's elevated BP: A new medication was prescribed today.          Physical Exam VS:  BP (!) 162/85 Comment: by Dr Francyne  Pulse (!) 57   Ht 5' 3 (1.6 m)   Wt 130 lb 9.6 oz (59.2 kg)   SpO2 98%   BMI 23.13 kg/m        Wt Readings from Last 3 Encounters:  02/05/24 130 lb 9.6 oz (59.2 kg)  02/03/24 129 lb 12.8 oz (58.9 kg)  01/06/24 132 lb (59.9 kg)    GEN: Well nourished, well developed in no acute distress NECK: No JVD; No carotid bruits CARDIAC: RRR, no diastolic murmurs, rubs, gallops.  She no longer has an aortic ejection murmur at the right upper sternal border.  Her apical holosystolic murmur is much quieter than it was when she had her TEE.  The murmur does intensify with the Valsalva maneuver. RESPIRATORY:  Clear to auscultation without rales, wheezing or rhonchi  ABDOMEN: Soft, non-tender, non-distended EXTREMITIES: Trivial edema of the left ankle, on the right there is no edema; No deformity   ASSESSMENT AND PLAN MVP with MR: Although she does not have hypertrophic cardiomyopathy and the extreme redundancy of the anterior leaflet causes left ventricular outflow tract obstruction.  This will be worsened by excessive diuresis, tachycardia and use of potent vasodilators.  Prefer to use beta-blockers in the maximum tolerated dose and avoid hydralazine .  She does not have symptoms of mitral insufficiency (dyspnea is not a big complaint) and the left ventricle is neither excessively dilated nor depressed.  On the contrary she has a hyperdynamic left ventricle.  At this point there is no indication for surgical or interventional repair of the mitral  valve. SAM and LV outflow obstruction: She has a hyperdynamic left ventricle with an extremely redundant mitral valve, but she does not have any features to suggest true hypertrophic obstructive cardiomyopathy.  Plan a repeat echocardiogram after the changes in medicines to see if there is still evidence of dynamic subvalvular aortic stenosis. HTN: She is on maximum tolerated dose of beta-blocker (limited by sinus bradycardia), maximum dose of ARB and cannot take a higher dose of amlodipine  without developing significant edema.  Will add a very low-dose of hydrochlorothiazide .  Hopefully this will bring her blood pressure in target range (less than 140/90), without causing hyponatremia or worsening of the LV outflow tract obstruction.  Okay to take acetaminophen  and Tylenol  for musculoskeletal pain, try to avoid NSAIDs such as the meloxicam that is on her prescription list or at least use these very sparingly. HLP: Excellent lipid profile with HDL 80, LDL 65, normal triglycerides, total cholesterol 159.  She does not have diabetes mellitus.  She has normal renal function.  Continue atorvastatin .       Dispo:  Patient Instructions  Medication Instructions:  Hydrochlorothiazide  12.5 Mg daily Carvedilol  12.5 mg twice a day  *If you need a  refill on your cardiac medications before your next appointment, please call your pharmacy*  Lab Work: None ordered If you have labs (blood work) drawn today and your tests are completely normal, you will receive your results only by: MyChart Message (if you have MyChart) OR A paper copy in the mail If you have any lab test that is abnormal or we need to change your treatment, we will call you to review the results.  Testing/Procedures: Your physician has requested that you have an echocardiogram in November. Echocardiography is a painless test that uses sound waves to create images of your heart. It provides your doctor with information about the size and shape  of your heart and how well your heart's chambers and valves are working. This procedure takes approximately one hour. There are no restrictions for this procedure. Please do NOT wear cologne, perfume, aftershave, or lotions (deodorant is allowed). Please arrive 15 minutes prior to your appointment time.  Please note: We ask at that you not bring children with you during ultrasound (echo/ vascular) testing. Due to room size and safety concerns, children are not allowed in the ultrasound rooms during exams. Our front office staff cannot provide observation of children in our lobby area while testing is being conducted. An adult accompanying a patient to their appointment will only be allowed in the ultrasound room at the discretion of the ultrasound technician under special circumstances. We apologize for any inconvenience.   Follow-Up: At [facility], you and your health needs are our priority.  As part of our continuing mission to provide you with exceptional heart care, our providers are all part of one team.  This team includes your primary Cardiologist (physician) and Advanced Practice Providers or APPs (Physician Assistants and Nurse Practitioners) who all work together to provide you with the care you need, when you need it.  Your next appointment:    In November= 4 months- needed after pt gets Echo  Provider:   Dr Francyne  We recommend signing up for the patient portal called MyChart.  Sign up information is provided on this After Visit Summary.  MyChart is used to connect with patients for Virtual Visits (Telemedicine).  Patients are able to view lab/test results, encounter notes, upcoming appointments, etc.  Non-urgent messages can be sent to your provider as well.   To learn more about what you can do with MyChart, go to [email or web address].          Signed, Alexis Francyne, MD

## 2024-02-06 NOTE — Telephone Encounter (Signed)
 Please tell her that I read through her records and discovered that the hydrochlorothiazide  was stopped in the past due to low sodium levels. We are using a lower dose than she did in the past. I would like her to have a basic metabolic panel in 6-8 weeks please. Sooner would not be representative.   Called and went over the information above with the patient. She will get lab work in 6-8 weeks at any [facility]- informed her that any [facility] will be fine- the order is released and they will be able to see the order. I will send her a reminder through MyChart when it gets closer to time to get lab work.  She verbalized understanding and is thankful that Dr Francyne is such an attentive provider.

## 2024-02-11 ENCOUNTER — Encounter: Payer: Self-pay | Admitting: Cardiovascular Disease

## 2024-02-12 ENCOUNTER — Telehealth: Payer: Self-pay

## 2024-02-12 NOTE — Telephone Encounter (Signed)
 Please ask her to report HR and BP. I suspect that the hydrochlorothiazide  has not kicked in fully and BP is probably still too high, but would need to know the vital signs.

## 2024-02-12 NOTE — Telephone Encounter (Signed)
 Call to patient to discuss concerns. Patient had visit with Dr. Francyne on 02/05/24. She reports she has had fatigue before with other medications she has tried with her mitral valve disorder. She states at this visit Dr. Francyne changed her medications and started her on hydrochlorothiazide  and increased her dose of carvedilol . She states yesterday and today she has had fatigue and today she has a headache/head pressure unrelieved by tylenol . She denies chest pain or shortness of breath. She is asking for further instructions from Dr. Francyne.

## 2024-02-13 DIAGNOSIS — H02401 Unspecified ptosis of right eyelid: Secondary | ICD-10-CM | POA: Diagnosis not present

## 2024-02-13 DIAGNOSIS — H0012 Chalazion right lower eyelid: Secondary | ICD-10-CM | POA: Diagnosis not present

## 2024-02-13 NOTE — Telephone Encounter (Signed)
 Pt notified of doctor's suggestions via MyChart. Last read by Itxel W Oliff at 8:59AM on 02/13/2024.

## 2024-02-14 MED ORDER — HYDROCHLOROTHIAZIDE 12.5 MG PO CAPS: 12.5000 mg | ORAL_CAPSULE | ORAL | Status: AC

## 2024-02-14 NOTE — Telephone Encounter (Signed)
 Hydrochlorothiazide  working better than expected. Let's drop to 12.5 mg every other day please.

## 2024-02-14 NOTE — Telephone Encounter (Signed)
 Med list updated- sent Dr's recommendations over [facility]

## 2024-02-14 NOTE — Addendum Note (Signed)
 Addended by: Scott Fix L on: 02/14/2024 06:27 PM   Modules accepted: Orders

## 2024-02-17 DIAGNOSIS — H02409 Unspecified ptosis of unspecified eyelid: Secondary | ICD-10-CM | POA: Diagnosis not present

## 2024-02-18 DIAGNOSIS — M25522 Pain in left elbow: Secondary | ICD-10-CM | POA: Diagnosis not present

## 2024-02-18 DIAGNOSIS — M25521 Pain in right elbow: Secondary | ICD-10-CM | POA: Diagnosis not present

## 2024-02-18 DIAGNOSIS — M791 Myalgia, unspecified site: Secondary | ICD-10-CM | POA: Diagnosis not present

## 2024-02-20 DIAGNOSIS — M5116 Intervertebral disc disorders with radiculopathy, lumbar region: Secondary | ICD-10-CM | POA: Diagnosis not present

## 2024-02-20 DIAGNOSIS — M5416 Radiculopathy, lumbar region: Secondary | ICD-10-CM | POA: Diagnosis not present

## 2024-03-05 ENCOUNTER — Ambulatory Visit: Admitting: Neurology

## 2024-03-12 ENCOUNTER — Ambulatory Visit: Admitting: Neurology

## 2024-03-12 VITALS — BP 143/75 | HR 58

## 2024-03-12 DIAGNOSIS — G6289 Other specified polyneuropathies: Secondary | ICD-10-CM

## 2024-03-12 MED ORDER — ONABOTULINUMTOXINA 100 UNITS IJ SOLR: 300.0000 [IU] | Freq: Once | INTRAMUSCULAR | Status: AC

## 2024-03-12 NOTE — Progress Notes (Signed)
 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

## 2024-03-12 NOTE — Progress Notes (Signed)
 03/12/2024: > 50% improvement with botox  on the bottom of the feet. Left foot anterior pad of lower foot and right underneath the toes, on the right the anterior pad of lower foot and part of the instep medially. Keep feet on chair and hold foot in dorsiflexion position tightly while injecting helped with the injection pain. She had an epidural in July not the sympathetic nerve block. Discussed still consider the sympathetic nerve block.   12/03/2023: stable  09/10/2023: > 50% improvement with botox  on the bottom of the feet.   Discussed the following and sent a message to Dr. Colon to consider per his clinical judgement:   Complex regional pain syndrome? Exquisit pain, changes in color/puplish-red under the feet. Hard to fulfill the criteria of complex regional pain syndrome bc on the bottom of the feet but can we consider the following: lumbar sympathetic nerve block injection or a spinal cord stimulator. Discussed with patient will reach out to Dr. Colon and gave her literature to read about CRPS, Sympathetic nerve block, spinal cord stimulator, Qutenza patch   05/28/2023: Improved, continue using the same dose 300 units. Use numbing spray for patient, severe pain on injections.working significantly well, >>50% improvement in pain  02/26/2023: improved even further will use higher dose will USE 300 U today and spray with numbing cream!! Can put 300 units into 2 syringes. Right foot is the worst on the cushion of the bottom of each foot and the right one an area in the arch.    12/04/2022 used 200 nad will increase to 300U july  1.23.2024: working significantly well, >>50% improvement in pain. Unfortunately she leaves for [place] on April 15th, not sure we can rebotox before then (12 weeks have to pass)  10.26/2023: Significanty helping. Concentrate into 2 syringes. >> 60% improvement in pain  03/05/2022: Significanty helping. Concentrate into 2 syringes. >> 60% improvement in pain  In the balls of  the feet and instep of the right foot (flexor digiotorum brevis, all lumbricals)  11/07/2021: After 3 months the severe pain returned, botox  is making a significant difference in pain > 60% improvement in pain. It was best to keep her feet on the chairs and dorsiflex them while I inject bc it hurts a lot. Left foot anterior pad of lower foot and right underneath the toes, on the right the anterior pad of lower foot and part of the instep medially. Keep feet on chair and hold foot in dorsiflexion position tightly while injecting helped with the injection pain.  07/16/2021: unclear how much improvement. We will stop botox  for now and see if she reports increased pain when it wears off 04/11/2021: Botox  works great > 60% improvement in pain and quality of life, she can feel it wearing off. 01/04/2021: > 50% improvement in pain, continue current dose 10/04/2020: first injections  History: Refractory idiopathic peripheral diabetic polyneuropathy. 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 Hx of diabetic(per-diabetic) neuropathy.   Procedure: All procedures documented were medically necessary, reasonable and appropriate based on the patient's history, medical diagnosis and physician opinion. Verbal informed consent was obtained from the patient, patient was informed of potential risk of procedure, including bruising, bleeding, hematoma formation, infection, muscle weakness, muscle pain, numbness, transient hypertension, transient hyperglycemia and transient insomnia among others. All areas injected were topically clean with isopropyl rubbing  alcohol. Nonsterile nonlatex gloves were worn during the procedure.   Injections were given bilaterally to the plantar surface of the feet.  The feet were properly sterilized with evenly spaced sites  bilaterally injected each with 10 units of botox  using a 1 ml 30-gauge needle.  300 units of Botox  were used and none was wasted.  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

## 2024-03-23 DIAGNOSIS — Z79899 Other long term (current) drug therapy: Secondary | ICD-10-CM | POA: Diagnosis not present

## 2024-03-24 ENCOUNTER — Encounter: Payer: Self-pay | Admitting: Cardiovascular Disease

## 2024-03-24 ENCOUNTER — Encounter: Payer: Self-pay | Admitting: Internal Medicine

## 2024-03-24 ENCOUNTER — Ambulatory Visit: Payer: Self-pay | Admitting: Cardiovascular Disease

## 2024-03-24 DIAGNOSIS — R5383 Other fatigue: Secondary | ICD-10-CM

## 2024-03-24 DIAGNOSIS — Z5181 Encounter for therapeutic drug level monitoring: Secondary | ICD-10-CM

## 2024-03-24 LAB — BASIC METABOLIC PANEL WITH GFR
BUN/Creatinine Ratio: 21 (ref 12–28)
BUN: 17 mg/dL (ref 8–27)
CO2: 23 mmol/L (ref 20–29)
Calcium: 9.6 mg/dL (ref 8.7–10.3)
Chloride: 95 mmol/L — ABNORMAL LOW (ref 96–106)
Creatinine, Ser: 0.8 mg/dL (ref 0.57–1.00)
Glucose: 71 mg/dL (ref 70–99)
Potassium: 3.9 mmol/L (ref 3.5–5.2)
Sodium: 134 mmol/L (ref 134–144)
eGFR: 73 mL/min/1.73 (ref >59)

## 2024-03-25 NOTE — Telephone Encounter (Signed)
 Mihai, can you see this please

## 2024-03-25 NOTE — Telephone Encounter (Signed)
 Please check TSH, free T4 and CBC in roughly one week

## 2024-03-30 DIAGNOSIS — R5383 Other fatigue: Secondary | ICD-10-CM | POA: Diagnosis not present

## 2024-03-31 ENCOUNTER — Ambulatory Visit: Payer: Self-pay | Admitting: Cardiovascular Disease

## 2024-03-31 ENCOUNTER — Encounter: Payer: Self-pay | Admitting: Cardiovascular Disease

## 2024-03-31 DIAGNOSIS — Z79899 Other long term (current) drug therapy: Secondary | ICD-10-CM

## 2024-03-31 LAB — CBC
Hematocrit: 37.4 % (ref 34.0–46.6)
Hemoglobin: 12.1 g/dL (ref 11.1–15.9)
MCH: 31.1 pg (ref 26.6–33.0)
MCHC: 32.4 g/dL (ref 31.5–35.7)
MCV: 96 fL (ref 79–97)
Platelets: 292 x10E3/uL (ref 150–450)
RBC: 3.89 x10E6/uL (ref 3.77–5.28)
RDW: 13.2 % (ref 11.7–15.4)
WBC: 8 x10E3/uL (ref 3.4–10.8)

## 2024-03-31 LAB — TSH: TSH: 1.45 u[IU]/mL (ref 0.450–4.500)

## 2024-03-31 LAB — T4, FREE: Free T4: 1.7 ng/dL (ref 0.82–1.77)

## 2024-03-31 MED ORDER — CARVEDILOL 6.25 MG PO TABS: 6.5000 mg | ORAL_TABLET | Freq: Two times a day (BID) | ORAL | 3 refills | Status: AC

## 2024-04-15 ENCOUNTER — Ambulatory Visit: Admitting: Cardiovascular Disease

## 2024-04-15 DIAGNOSIS — H0279 Other degenerative disorders of eyelid and periocular area: Secondary | ICD-10-CM | POA: Diagnosis not present

## 2024-04-15 DIAGNOSIS — H0015 Chalazion left lower eyelid: Secondary | ICD-10-CM | POA: Diagnosis not present

## 2024-04-15 DIAGNOSIS — H02423 Myogenic ptosis of bilateral eyelids: Secondary | ICD-10-CM | POA: Diagnosis not present

## 2024-04-15 DIAGNOSIS — H02421 Myogenic ptosis of right eyelid: Secondary | ICD-10-CM | POA: Diagnosis not present

## 2024-04-15 DIAGNOSIS — H02831 Dermatochalasis of right upper eyelid: Secondary | ICD-10-CM | POA: Diagnosis not present

## 2024-04-15 DIAGNOSIS — H02411 Mechanical ptosis of right eyelid: Secondary | ICD-10-CM | POA: Diagnosis not present

## 2024-04-15 DIAGNOSIS — H02413 Mechanical ptosis of bilateral eyelids: Secondary | ICD-10-CM | POA: Diagnosis not present

## 2024-04-15 DIAGNOSIS — H57813 Brow ptosis, bilateral: Secondary | ICD-10-CM | POA: Diagnosis not present

## 2024-04-15 DIAGNOSIS — H02422 Myogenic ptosis of left eyelid: Secondary | ICD-10-CM | POA: Diagnosis not present

## 2024-04-15 DIAGNOSIS — H53483 Generalized contraction of visual field, bilateral: Secondary | ICD-10-CM | POA: Diagnosis not present

## 2024-04-15 DIAGNOSIS — H02412 Mechanical ptosis of left eyelid: Secondary | ICD-10-CM | POA: Diagnosis not present

## 2024-04-15 DIAGNOSIS — H02834 Dermatochalasis of left upper eyelid: Secondary | ICD-10-CM | POA: Diagnosis not present

## 2024-04-16 MED ORDER — AMLODIPINE BESYLATE 10 MG PO TABS: 10.0000 mg | ORAL_TABLET | Freq: Every day | ORAL | 3 refills | Status: DC

## 2024-04-16 NOTE — Telephone Encounter (Signed)
 Lets try increasing the amlodipine  to 10 mg daily.  Give it a week or 10 days to fully kick in.

## 2024-04-17 MED ORDER — AMLODIPINE BESYLATE 5 MG PO TABS: 5.0000 mg | ORAL_TABLET | Freq: Every day | ORAL | 3 refills | Status: AC

## 2024-04-17 MED ORDER — SPIRONOLACTONE 25 MG PO TABS: 25.0000 mg | ORAL_TABLET | Freq: Every day | ORAL | 3 refills | Status: AC

## 2024-04-17 NOTE — Telephone Encounter (Signed)
 Not a whole lot of options since this the maximum dose of olmesartan , we had to reduce the carvedilol , cannot take more amlodipine .  Let's try adding spironolactone  25 mg once daily.  It will take a week or 2 to see the effect of this medication.  To check a basic metabolic panel after 3-4 weeks.

## 2024-04-17 NOTE — Telephone Encounter (Signed)
 Spironolactone  25 mg daily sent to pharmacy. BMP ordered. Amlodipine  decreased to 5 mg- sent to pharmacy.  Sent pt a [employer] [facility].

## 2024-04-27 DIAGNOSIS — H0014 Chalazion left upper eyelid: Secondary | ICD-10-CM | POA: Diagnosis not present

## 2024-04-27 DIAGNOSIS — H0012 Chalazion right lower eyelid: Secondary | ICD-10-CM | POA: Diagnosis not present

## 2024-04-27 DIAGNOSIS — H0015 Chalazion left lower eyelid: Secondary | ICD-10-CM | POA: Diagnosis not present

## 2024-04-29 DIAGNOSIS — M5416 Radiculopathy, lumbar region: Secondary | ICD-10-CM | POA: Diagnosis not present

## 2024-04-29 DIAGNOSIS — M5441 Lumbago with sciatica, right side: Secondary | ICD-10-CM | POA: Diagnosis not present

## 2024-04-30 ENCOUNTER — Other Ambulatory Visit: Payer: Self-pay | Admitting: Neurological Surgery

## 2024-04-30 DIAGNOSIS — G8929 Other chronic pain: Secondary | ICD-10-CM

## 2024-05-05 ENCOUNTER — Ambulatory Visit
Admission: RE | Admit: 2024-05-05 | Discharge: 2024-05-05 | Disposition: A | Discharge status: Home / Self Care | Source: Ambulatory Visit | Attending: Neurological Surgery | Admitting: Neurological Surgery

## 2024-05-05 DIAGNOSIS — M5116 Intervertebral disc disorders with radiculopathy, lumbar region: Secondary | ICD-10-CM | POA: Diagnosis not present

## 2024-05-05 DIAGNOSIS — G8929 Other chronic pain: Secondary | ICD-10-CM

## 2024-05-05 DIAGNOSIS — M4727 Other spondylosis with radiculopathy, lumbosacral region: Secondary | ICD-10-CM | POA: Diagnosis not present

## 2024-05-05 DIAGNOSIS — M48061 Spinal stenosis, lumbar region without neurogenic claudication: Secondary | ICD-10-CM | POA: Diagnosis not present

## 2024-05-06 ENCOUNTER — Telehealth: Payer: Self-pay | Admitting: Neurology

## 2024-05-06 DIAGNOSIS — G6289 Other specified polyneuropathies: Secondary | ICD-10-CM

## 2024-05-06 NOTE — Telephone Encounter (Signed)
 Dr [name] pt is asking to be called to discuss her Botox  being r/s with another provider

## 2024-05-07 ENCOUNTER — Telehealth: Payer: Self-pay | Admitting: Neurology

## 2024-05-07 DIAGNOSIS — M810 Age-related osteoporosis without current pathological fracture: Secondary | ICD-10-CM | POA: Diagnosis not present

## 2024-05-07 NOTE — Telephone Encounter (Signed)
 [facility] needs a provider to place the referral in Epic. Are you able to do this?

## 2024-05-07 NOTE — Telephone Encounter (Signed)
 I have not done this before, may refer out

## 2024-05-07 NOTE — Telephone Encounter (Signed)
 Spoke with [name], she states we can send referral to [facility]. I called the pt and advised her of the plan, she is disappointed but agreeable for referral to be sent.   Faxed referral to [facility] @ [phone].

## 2024-05-07 NOTE — Telephone Encounter (Signed)
 Pt was originally scheduled 11/3 with Dr. Ines for Botox  300u for neuropathy in her feet. Is this something you would see for or should she be referred out?

## 2024-05-07 NOTE — Telephone Encounter (Signed)
 Referral for Physical Med Rehab sent thru Epic to [facility]  [facility] Phone: [phone]

## 2024-05-07 NOTE — Addendum Note (Signed)
 Addended by: Bernardina Cacho on: 05/07/2024 02:02 PM   Modules accepted: Orders

## 2024-05-07 NOTE — Telephone Encounter (Signed)
 Orders Placed This Encounter  Procedures   Ambulatory referral to Physical Medicine Rehab

## 2024-05-07 NOTE — Telephone Encounter (Signed)
 I would use an MD

## 2024-05-11 DIAGNOSIS — Z79899 Other long term (current) drug therapy: Secondary | ICD-10-CM | POA: Diagnosis not present

## 2024-05-11 DIAGNOSIS — M5441 Lumbago with sciatica, right side: Secondary | ICD-10-CM | POA: Diagnosis not present

## 2024-05-12 ENCOUNTER — Encounter: Payer: Self-pay | Admitting: Cardiovascular Disease

## 2024-05-12 LAB — BASIC METABOLIC PANEL WITH GFR
BUN/Creatinine Ratio: 23 (ref 12–28)
BUN: 25 mg/dL (ref 8–27)
CO2: 20 mmol/L (ref 20–29)
Calcium: 9.7 mg/dL (ref 8.7–10.3)
Chloride: 93 mmol/L — ABNORMAL LOW (ref 96–106)
Creatinine, Ser: 1.1 mg/dL — ABNORMAL HIGH (ref 0.57–1.00)
Glucose: 81 mg/dL (ref 70–99)
Potassium: 4.6 mmol/L (ref 3.5–5.2)
Sodium: 130 mmol/L — ABNORMAL LOW (ref 134–144)
eGFR: 50 mL/min/1.73 — ABNORMAL LOW (ref >59)

## 2024-05-18 ENCOUNTER — Encounter: Payer: Self-pay | Admitting: Cardiovascular Disease

## 2024-05-18 DIAGNOSIS — L03019 Cellulitis of unspecified finger: Secondary | ICD-10-CM | POA: Diagnosis not present

## 2024-05-18 NOTE — Telephone Encounter (Signed)
 Error

## 2024-05-26 ENCOUNTER — Encounter: Payer: Self-pay | Admitting: Internal Medicine

## 2024-05-26 ENCOUNTER — Other Ambulatory Visit: Payer: Self-pay | Admitting: Medical Genetics

## 2024-05-26 ENCOUNTER — Other Ambulatory Visit

## 2024-05-26 ENCOUNTER — Ambulatory Visit: Payer: HMO | Admitting: Internal Medicine

## 2024-05-26 VITALS — BP 112/60 | HR 58 | Ht 63.0 in | Wt 128.8 lb

## 2024-05-26 DIAGNOSIS — Z8639 Personal history of other endocrine, nutritional and metabolic disease: Secondary | ICD-10-CM

## 2024-05-26 DIAGNOSIS — E890 Postprocedural hypothyroidism: Secondary | ICD-10-CM | POA: Diagnosis not present

## 2024-05-26 DIAGNOSIS — Z006 Encounter for examination for normal comparison and control in clinical research program: Secondary | ICD-10-CM

## 2024-05-26 LAB — TSH: TSH: 0.62 m[IU]/L (ref 0.40–4.50)

## 2024-05-26 LAB — T4, FREE: Free T4: 1.6 ng/dL (ref 0.8–1.8)

## 2024-05-26 NOTE — Progress Notes (Signed)
 Patient ID: [name], female   DOB: 1940/12/01, 83 y.o.   MRN: [id number]  HPI  [name] is a 83 y.o.-year-old female, returning for f/u for h/o Graves ds and subsequent postablative hypothyroidism. Last visit 1 year ago.  Interim history: Before last visit, she had a fall in 04/2023 >> fractured nose, bruised R eye. She also sprained her R upper arm >> no fracture.  She still has falls. No fractures since last OV.  She had dizziness - her BP meds were changed >> dizziness resolved. She feels BP improved on Buspar. She has muscle weakness and trochanteric bursitis.  She continues to have hip steroid injections. Last inj. 3 mo ago.  Reviewed history: Patient was admitted to the hospital 06/2013 with Takotsubo cardiomyopathy, and a low TSH was found incidentally. She had cardiac catheterization on 06/17/2013 (after blood for TSH was drawn). She was also found to have atrial fibrillation in the hospital, then resolved (on Coreg ). The patient's EF also normalized before discharge.  She was scheduled to have a hospital followup appointment with her PCP. Dr. Charlett repeated a TSH level and added a free T4 and a free T3 and these returned abnormal, suggesting thyrotoxicosis. Of note, her pulse was in the 50s and she was not in atrial fibrillation at the time of the appointment. Patient was referred to endocrinology for for further management.  Thyroid  Uptake and scan (12/28/2013): Markedly elevated 24 hr radio iodine uptake of 82% Normal thyroid  scan. Findings consistent with Graves disease.  Pt was initially on MMI, then had RAI Tx 01/26/2014 >> developed post ablative hypothyroidism  Pt is currently on levothyroxine  75 mcg daily, taken: - at 6 am - fasting - coffee + cream > 30 min later - at least 30 min from b'fast - no Fe, MVI - + on Protonix  intermittently in the pm - + Calcium  with lunch and dinner - + Biotin  5000 mcg daily - last dose  10 days ago Stopped B  complex.  Reviewed her TFTs: Lab Results  Component Value Date   TSH 1.450 03/30/2024   TSH 5.233 (H) 12/13/2023   TSH 1.99 06/03/2023   TSH 1.40 05/21/2022   TSH 1.77 05/16/2021   TSH 0.79 05/23/2020   TSH 1.47 11/10/2019   TSH 8.69 (H) 09/29/2019   TSH 3.88 06/29/2019   TSH 0.27 (L) 05/18/2019   FREET4 1.70 03/30/2024   FREET4 1.11 06/03/2023   FREET4 1.06 05/21/2022   FREET4 1.03 05/16/2021   FREET4 0.98 05/23/2020   FREET4 1.02 11/10/2019   FREET4 0.86 09/29/2019   FREET4 0.86 06/29/2019   FREET4 1.39 05/18/2019   FREET4 1.22 11/20/2018  fT3 on 06/26/2013: 6.4 (2.3 - 4.2)   TSI's were elevated: Lab Results  Component Value Date   TSI 472 (H) 05/18/2019    Pt denies: - feeling nodules in neck - hoarseness - dysphagia - choking  She also has a h/o HTN, HL, tricuspid regurgitation, mitral valve prolapse, PMR. She had cataract sx. 06/2018.  She has dry eyes. She sees Dr. McCuen.  She occasionally uses steroid drops. She has a h/o L4 burst compression fx >> Gabapentin .  On Prolia  - tolerated well.  This is managed in [place]. She also has collagenous colitis.  ROS: + See HPI  I reviewed pt's medications, allergies, PMH, social hx, family hx, and changes were documented in the history of present illness. Otherwise, unchanged from my initial visit note.  Past Medical History:  Diagnosis Date  Abnormal EKG    Atrial fibrillation (HCC)    Basal cell carcinoma of nose    removed w/MOHs   CAD (coronary artery disease)    a. 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 EF 30-35% at [facility] 06/2013;  b.  f/u Echo (06/18/13):  EF 60-65%, normal wall motion, Gr 1 DD, mild MVP of post leaflet, mod TR, PASP 63   Thyroid  disease    Tricuspid regurgitation    mild to moderate, echo, January, 2011, PA pressure 38 mm mercury   Past Surgical History:  Procedure Laterality Date   CARDIAC CATHETERIZATION  06/17/2013   COLONOSCOPY  12/23/2003   normal (indications: prior adenomas and grandfather with colon cancer)   COLPOSCOPY     EXCISIONAL HEMORRHOIDECTOMY  2003   LEFT HEART CATHETERIZATION WITH CORONARY ANGIOGRAM N/A 06/17/2013   Procedure: LEFT HEART CATHETERIZATION WITH CORONARY ANGIOGRAM;  Surgeon: Peter M Swaziland, MD;  Location: [facility] CATH LAB;  Service: Cardiovascular;  Laterality: N/A;   MOHS SURGERY Left 2011   side of my nose (06/17/2013)   TONSILLECTOMY     TOTAL KNEE ARTHROPLASTY  08/2010   TOTAL KNEE ARTHROPLASTY  02/25/2012   Procedure: TOTAL KNEE ARTHROPLASTY;  Surgeon: Dempsey LULLA Moan, MD;  Location: [facility] ORS;  Service: Orthopedics;  Laterality: Right;   TRANSESOPHAGEAL ECHOCARDIOGRAM (CATH LAB) N/A 12/23/2023   Procedure: TRANSESOPHAGEAL ECHOCARDIOGRAM;  Surgeon:  Francyne Headland, MD;  Location: [facility] INVASIVE CV LAB;  Service: Cardiovascular;  Laterality: N/A;   Social History   Socioeconomic History   Marital status: Widowed    Spouse name: Not on file   Number of children: 1   Years of education: Not on file   Highest education level: Not on file  Occupational History   Occupation: Homemaker  Tobacco Use   Smoking status: Never   Smokeless tobacco: Never  Vaping Use   Vaping status: Never Used  Substance and Sexual Activity   Alcohol use: Yes    Alcohol/week: 0.0 standard drinks of alcohol    Comment: Rare  Drug use: No   Sexual activity: Never    Birth control/protection: Post-menopausal    Comment: 1st intercourse 83 yo-Fewer than 5 partners  Other Topics Concern   Not on file  Social History Narrative   3 caffeine drinks daily    Widowed   Retired      Lives alone   Right handed   Caffeine: 1.5 cup/day   Social Drivers of Corporate investment banker Strain: Not on file  Food Insecurity: No Food Insecurity (12/12/2023)   Hunger Vital Sign    Worried About Running Out of Food in the Last Year: Never true    Ran Out of Food in the Last Year: Never true  Transportation Needs: No Transportation Needs (12/12/2023)   PRAPARE - Administrator, Civil Service (Medical): No    Lack of Transportation (Non-Medical): No  Physical Activity: Not on file  Stress: Not on file  Social Connections: Not on file  Intimate Partner Violence: Not At Risk (12/12/2023)   Humiliation, Afraid, Rape, and Kick questionnaire    Fear of Current or Ex-Partner: No    Emotionally Abused: No    Physically Abused: No    Sexually Abused: No   Current Outpatient Medications on File Prior to Visit  Medication Sig Dispense Refill   amLODipine  (NORVASC ) 5 MG tablet Take 1 tablet (5 mg total) by mouth daily. 90 tablet 3   atorvastatin  (LIPITOR) 20 MG tablet Take 1 tablet (20 mg total) by mouth daily. 90 tablet 3   Biotin  5000 MCG CAPS Take 1 capsule  by mouth daily.      Calcium  Carbonate (CALTRATE 600 PO) Take 1 tablet by mouth 2 (two) times a day.      carvedilol  (COREG ) 6.25 MG tablet Take 1 tablet (6.25 mg total) by mouth 2 (two) times daily. 180 tablet 3   Cholecalciferol  25 MCG (1000 UT) capsule Take 1,000 Units by mouth daily.     diclofenac sodium (VOLTAREN) 1 % GEL Apply 1 application  topically 4 (four) times daily as needed (pain).  2   doxycycline (VIBRAMYCIN) 100 MG capsule Take 50 mg by mouth daily.     gabapentin  (NEURONTIN ) 300 MG capsule Take 1 capsule (300 mg total) by mouth 3 (three) times daily as needed. 90 capsule 11   hydrochlorothiazide  (MICROZIDE ) 12.5 MG capsule Take 1 capsule (12.5 mg total) by mouth every other day.     ipratropium (ATROVENT) 0.06 % nasal spray Place 2 sprays into both nostrils 3 (three) times daily as needed for rhinitis.     levothyroxine  (SYNTHROID ) 75 MCG tablet Take 1 tablet (75 mcg total) by mouth daily. 90 tablet 3   meloxicam (MOBIC) 15 MG tablet Take 15 mg by mouth daily as needed for pain.     metroNIDAZOLE  (METROCREAM ) 0.75 % cream Apply 1 application topically 2 (two) times daily.     NONFORMULARY OR COMPOUNDED ITEM Apply 1-2 g topically in the morning, at noon, in the evening, and at bedtime. Compound cream: Meloxicam 0.5% Doxepin 3% Amantadine 3% Dextromethorphan 2% Lidocaine  2%  Sig: Apply 1-2 grams to the affected area 3-4 times daily. Dispense 240 grams with 5 refills. 1 each 5   olmesartan  (BENICAR ) 40 MG tablet Take 1 tablet (40 mg total) by mouth daily. 90 tablet 3   pantoprazole  (PROTONIX ) 40 MG tablet Take 1 tablet by mouth daily as needed (heartburn).     spironolactone  (ALDACTONE ) 25 MG tablet Take 1 tablet (25 mg total)  by mouth daily. 90 tablet 3   traMADol  (ULTRAM ) 50 MG tablet Take 1 tablet by mouth every 6 (six) hours as needed for severe pain (pain score 7-10) or moderate pain (pain score 4-6).     traZODone (DESYREL) 50 MG tablet Take 50 mg by mouth at bedtime as  needed for sleep.     Current Facility-Administered Medications on File Prior to Visit  Medication Dose Route Frequency Provider Last Rate Last Admin   botulinum toxin Type A  (BOTOX ) injection 100 Units  100 Units Intramuscular Once Ines Onetha NOVAK, MD       botulinum toxin Type A  (BOTOX ) injection 300 Units  300 Units Intramuscular Once Ahern, Antonia B, MD       botulinum toxin Type A  (BOTOX ) injection 300 Units  300 Units Intramuscular Once Ahern, Antonia B, MD       No Known Allergies Family History  Problem Relation Age of Onset   Heart attack Mother 37   Hypertension Mother    Heart disease Mother    Stroke Mother    Heart attack Father 52   Coronary artery disease Father        Multiple Family Members   Hypertension Father    Heart disease Father    Esophageal cancer Paternal Grandmother    Colon cancer Maternal Grandfather    Heart disease Maternal Grandfather    Irritable bowel syndrome Sister        More family members on father side of    Hypertension Sister    Heart disease Sister    Breast cancer Sister 65   Breast cancer Paternal Aunt        Age 88's   Heart disease Paternal Grandfather    Rectal cancer Neg Hx    Stomach cancer Neg Hx    PE: BP 112/60   Pulse (!) 58   Ht 5' 3 (1.6 m)   Wt 128 lb 12.8 oz (58.4 kg)   SpO2 97%   BMI 22.82 kg/m   Wt Readings from Last 3 Encounters:  05/26/24 128 lb 12.8 oz (58.4 kg)  02/05/24 130 lb 9.6 oz (59.2 kg)  02/03/24 129 lb 12.8 oz (58.9 kg)   Constitutional: normal weight, in NAD, walks with a cane Eyes: EOMI, no exophthalmos ENT: no thyromegaly, no cervical lymphadenopathy Cardiovascular: RRR, No MRG Respiratory: CTA B Musculoskeletal: no deformities Skin: no rashes Neurological: no tremor with outstretched hands  ASSESSMENT: 1. H/o Graves ds  2. Postablative hypothyroidism  3. S/p L4 fracture - She fell backwards >> L4 compression fracture.  - She was on Reclast  >> this year was her fourth drug  holiday year. - Managed by Dr. Charleston in [place] - We previously discussed pros and cons of Prolia  >> started it and continues on this  PLAN:  1. Patient with history of Graves' disease, status post RAI treatment, now with postablative hypothyroidism - She has dry eyes, but no active signs of Graves' ophthalmopathy: No blurry vision, double vision, eye pain, chemosis.  She is seeing ophthalmology. - TSI antibody titer was still elevated at last check - We will not repeat this today  2. Postablative hypothyroidism - latest thyroid  labs reviewed with pt. >> normal: Lab Results  Component Value Date   TSH 1.450 03/30/2024  - she continues on LT4 75 mcg daily - pt feels good on this dose.  Her dizziness was found to be related to her more aggressive blood pressure medication regimen.  This has been  changed and she is feeling much better.  Her blood pressure increased after changing her antihypertensive regimen, but she feels that this improved after starting BuSpar.  At today's visit, blood pressure is excellent. - we discussed about taking the thyroid  hormone every day, with water, >30 minutes before breakfast, separated by >4 hours from acid reflux medications, calcium , iron, multivitamins. Pt. is taking it correctly. - will check thyroid  tests today: TSH and fT4 - If labs are abnormal, she will need to return for repeat TFTs in 1.5 months - OTW, I will see her back in a year  She needs LT4 refills.  Component     Latest Ref Rng 05/26/2024  T4,Free(Direct)     0.8 - 1.8 ng/dL 1.6   TSH     9.59 - 5.49 mIU/L 0.62   Normal TFTs.  Lela Fendt, MD PhD [facility] Endocrinology

## 2024-05-26 NOTE — Patient Instructions (Signed)
Please continue Levothyroxine 75 mcg daily.  Take the thyroid hormone every day, with water, at least 30 minutes before breakfast, separated by at least 4 hours from: - acid reflux medications - calcium - iron - multivitamins  Please stop at the lab.  You should have an endocrinology follow-up appointment in 1 year.

## 2024-05-27 ENCOUNTER — Ambulatory Visit: Payer: Self-pay | Admitting: Internal Medicine

## 2024-05-27 DIAGNOSIS — L03012 Cellulitis of left finger: Secondary | ICD-10-CM | POA: Diagnosis not present

## 2024-05-27 DIAGNOSIS — Z85828 Personal history of other malignant neoplasm of skin: Secondary | ICD-10-CM | POA: Diagnosis not present

## 2024-05-27 DIAGNOSIS — L03011 Cellulitis of right finger: Secondary | ICD-10-CM | POA: Diagnosis not present

## 2024-05-27 MED ORDER — LEVOTHYROXINE SODIUM 75 MCG PO TABS: 75.0000 ug | ORAL_TABLET | Freq: Every day | ORAL | 3 refills | Status: DC

## 2024-05-27 NOTE — Addendum Note (Signed)
 Addended by: TRIXIE FILE on: 05/27/2024 09:28 AM   Modules accepted: Orders

## 2024-05-28 NOTE — Telephone Encounter (Signed)
 On 05-28-2024  at 12:00 pm , Pt [name] stating she doesn't know what to  do about Botox  Injection. Pt states she called [facility]  . They informed Pt that referral was under review . Pt  Stated she had an appointment  with DR. Ines . On the 11- 03 .

## 2024-05-28 NOTE — Telephone Encounter (Signed)
 I had forwarded the message in a different encounter but [facility] sent me a staff message that they don't do Botox  for neuropathy. Is there something else we can do for her?

## 2024-06-01 DIAGNOSIS — H53483 Generalized contraction of visual field, bilateral: Secondary | ICD-10-CM | POA: Diagnosis not present

## 2024-06-01 NOTE — Telephone Encounter (Signed)
 Per Dr. Onita, advised pt to ask PCP for pain management referral.

## 2024-06-02 DIAGNOSIS — M5416 Radiculopathy, lumbar region: Secondary | ICD-10-CM | POA: Diagnosis not present

## 2024-06-02 DIAGNOSIS — M5116 Intervertebral disc disorders with radiculopathy, lumbar region: Secondary | ICD-10-CM | POA: Diagnosis not present

## 2024-06-08 ENCOUNTER — Ambulatory Visit: Admitting: Neurology

## 2024-06-16 ENCOUNTER — Ambulatory Visit (HOSPITAL_COMMUNITY)
Admission: RE | Admit: 2024-06-16 | Discharge: 2024-06-16 | Disposition: A | Discharge status: Home / Self Care | Source: Ambulatory Visit | Attending: Cardiovascular Disease | Admitting: Cardiovascular Disease

## 2024-06-16 DIAGNOSIS — I340 Nonrheumatic mitral (valve) insufficiency: Secondary | ICD-10-CM | POA: Diagnosis not present

## 2024-06-16 LAB — ECHOCARDIOGRAM COMPLETE
Area-P 1/2: 2.19 cm2
S' Lateral: 2.7 cm

## 2024-06-17 ENCOUNTER — Encounter: Payer: Self-pay | Admitting: Physical Medicine and Rehabilitation

## 2024-06-17 ENCOUNTER — Ambulatory Visit: Payer: Self-pay | Admitting: Cardiovascular Disease

## 2024-06-19 ENCOUNTER — Ambulatory Visit: Admitting: Cardiovascular Disease

## 2024-06-22 ENCOUNTER — Ambulatory Visit: Attending: Cardiovascular Disease | Admitting: Cardiovascular Disease

## 2024-06-22 VITALS — BP 134/66 | HR 75 | Ht 63.0 in | Wt 131.0 lb

## 2024-06-22 DIAGNOSIS — I10 Essential (primary) hypertension: Secondary | ICD-10-CM | POA: Diagnosis not present

## 2024-06-22 DIAGNOSIS — I340 Nonrheumatic mitral (valve) insufficiency: Secondary | ICD-10-CM | POA: Diagnosis not present

## 2024-06-22 DIAGNOSIS — E7800 Pure hypercholesterolemia, unspecified: Secondary | ICD-10-CM | POA: Diagnosis not present

## 2024-06-22 DIAGNOSIS — E871 Hypo-osmolality and hyponatremia: Secondary | ICD-10-CM | POA: Diagnosis not present

## 2024-06-22 DIAGNOSIS — Z79899 Other long term (current) drug therapy: Secondary | ICD-10-CM | POA: Diagnosis not present

## 2024-06-22 DIAGNOSIS — Q248 Other specified congenital malformations of heart: Secondary | ICD-10-CM

## 2024-06-22 NOTE — Patient Instructions (Addendum)
 Medication Instructions:  Your physician recommends that you continue on your current medications as directed. Please refer to the Current Medication list given to you today.  *If you need a refill on your cardiac medications before your next appointment, please call your pharmacy*  Testing/Procedures: Your physician has requested that you have an echocardiogram in one year. Echocardiography is a painless test that uses sound waves to create images of your heart. It provides your doctor with information about the size and shape of your heart and how well your heart's chambers and valves are working. This procedure takes approximately one hour. There are no restrictions for this procedure. Please do NOT wear cologne, perfume, aftershave, or lotions (deodorant is allowed). Please arrive 15 minutes prior to your appointment time.  Please note: We ask at that you not bring children with you during ultrasound (echo/ vascular) testing. Due to room size and safety concerns, children are not allowed in the ultrasound rooms during exams. Our front office staff cannot provide observation of children in our lobby area while testing is being conducted. An adult accompanying a patient to their appointment will only be allowed in the ultrasound room at the discretion of the ultrasound technician under special circumstances. We apologize for any inconvenience.   Follow-Up: At [facility], you and your health needs are our priority.  As part of our continuing mission to provide you with exceptional heart care, our providers are all part of one team.  This team includes your primary Cardiologist (physician) and Advanced Practice Providers or APPs (Physician Assistants and Nurse Practitioners) who all work together to provide you with the care you need, when you need it.  Your next appointment:   1 year(s)  Provider:   Jerel Balding, MD

## 2024-06-22 NOTE — Progress Notes (Unsigned)
 Cardiology Office Note   Date:  06/23/2024  ID:  [name], [name] 06/13/1941, MRN [id number] PCP: Clarice Nottingham, MD  [facility] Providers Cardiologist:  Jerel Balding, MD     History of Present Illness [name] is a 83 y.o. female with a longstanding history hypertension, mitral valve prolapse with secondary mitral regurgitation, hypercholesterolemia,, remote history of Takotsubo cardiomyopathy transitioning care from Dr. Lonni.  She is feeling much better than at last appointment.  After multiple iterations, it seems we found a combination of medications that treat her hypertension well without worsening her cardiac hemodynamics.  She has not had palpitations, dizziness, syncope, shortness of breath or chest pain at rest or with activity.  Denies lower extremity edema, orthopnea, PND.  Does not have orthostatic hypotension symptoms.  She is a widow and lives alone.  She is preparing to move to [place].  Has some anxiety about that.  One of her deceased husband's brother lives there, but unfortunately he has serious health problems.  She has recently had difficulty with blood pressure control due to medication side effects.  She felt poorly when she was prescribed hydralazine  with worsening fatigue, tachycardia and dyspnea.  Higher doses of amlodipine  have caused problems with lower extremity edema.  Hydrochlorothiazide  appeared to cause problems with hyponatremia.  She has had problems off and on with orthostatic hypotension, most recently when treated with spironolactone .  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  There was  moderate to severe mitral insufficiency.        Studies Reviewed EKG Interpretation Date/Time:  Monday June 22 2024 15:14:07 EST Ventricular Rate:  75 PR Interval:  190 QRS Duration:  96 QT Interval:  386 QTC Calculation: 431 R Axis:   -24  Text Interpretation: Sinus rhythm with premature atrial contractions Possible Left atrial enlargement Left ventricular hypertrophy ( R in aVL , Cornell product ) When compared with ECG of 12-Dec-2023 16:55, previous ECG had limb lead reversal Confirmed by Brendaliz Kuk ([id number]) on 06/22/2024 5:12:27 PM   TEE 12/23/2023    1. There is subvalvular dynamic LV outflow obstruction due to systolic  anterior motion of the redundant anterior mitral leaflet. At rest the peak  outflow gradient is 30 mm Hg. Left ventricular ejection fraction, by  estimation, is 65 to 70%. The left  ventricle has hyperdynamic function. The left ventricle has no regional  wall motion abnormalities.   2. Right ventricular systolic function is normal. The right ventricular  size is normal.   3. Left atrial size was mildly dilated. No left atrial/left atrial  appendage thrombus was detected.   4. Barlow's syndrome. Both mitral leaflets are very redundant. The  anterior leaflet has moderate systolic anterior motion with left  ventricular outflow obstruction. Mitral regurgitation vena contracta 3 mm.  The effective regurgitant orifice area is 0.22   cm sq, regurgitant volume 26 ml, regurgitant fraction could not be  calculated due to LV outflow tract aliasing/obstruction. The mitral valve  is myxomatous. Moderate mitral valve regurgitation. No evidence of mitral  stenosis. There is severe holosystolic   prolapse of the middle scallop of the posterior leaflet of the mitral  valve. The mean mitral valve gradient is 1.4 mmHg  with average heart rate  of 77 bpm.   5. The tricuspid valve is myxomatous. Tricuspid valve regurgitation is  moderate.   6. The aortic valve is  tricuspid. There is mild thickening of the aortic  valve. Aortic valve regurgitation is not visualized. Aortic valve  sclerosis is present, with no evidence of aortic valve stenosis.   7. There is mild (Grade II) protruding plaque involving the descending  aorta and aortic arch.   8. The right upper pulmonary vein is abnormal.   9. 3D performed of the mitral valve and demonstrates severe prolapse of  the posterior leaflet and moderate systolic anterior motion of the  anterior leaflet.    Risk Assessment/Calculations           Physical Exam VS:  BP 134/66 (BP Location: Left Arm, Patient Position: Sitting, Cuff Size: Small)   Pulse 75   Ht 5' 3 (1.6 m)   Wt 131 lb (59.4 kg)   BMI 23.21 kg/m        Wt Readings from Last 3 Encounters:  06/22/24 131 lb (59.4 kg)  05/26/24 128 lb 12.8 oz (58.4 kg)  02/05/24 130 lb 9.6 oz (59.2 kg)     General: Alert, oriented x3, no distress, lean, appears younger than stated age Head: no evidence of trauma, PERRL, EOMI, no exophtalmos or lid lag, no myxedema, no xanthelasma; normal ears, nose and oropharynx Neck: normal jugular venous pulsations and no hepatojugular reflux; brisk carotid pulses without delay and no carotid bruits Chest: clear to auscultation, no signs of consolidation by percussion or palpation, normal fremitus, symmetrical and full respiratory excursions Cardiovascular: normal position and quality of the apical impulse, regular rhythm, normal first and second heart sounds, no diastolic murmurs, rubs or gallops.  There is no systolic murmur at the right upper sternal border.  There is a mid-- late systolic click followed by a late systolic murmur heard at the apex that increases following the Valsalva maneuver and diminishes with handgrip Abdomen: no tenderness or distention, no masses by palpation, no abnormal pulsatility or arterial bruits, normal bowel sounds, no hepatosplenomegaly Extremities: no clubbing, cyanosis or edema; 2+  radial, ulnar and brachial pulses bilaterally; 2+ right femoral, posterior tibial and dorsalis pedis pulses; 2+ left femoral, posterior tibial and dorsalis pedis pulses; no subclavian or femoral bruits Neurological: grossly nonfocal Psych: Normal mood and affect   ASSESSMENT AND PLAN MVP with MR: Although she does not have hypertrophic cardiomyopathy, the extreme redundancy of the anterior leaflet causes left ventricular outflow tract obstruction.  This was prominent when she was excessively diuresed and tachycardic and was taking potent vasodilators, but has improved with changes in her antihypertensive medications.   Prefer to use beta-blockers in the maximum tolerated dose and avoid hydralazine .  She does not have symptoms of mitral insufficiency (dyspnea is not a big complaint) and the left ventricle is neither excessively dilated nor depressed.  On the contrary she has a hyperdynamic left ventricle.  At this point there is no indication for surgical or interventional repair of the mitral valve.  Hopefully will never have to deal with surgery for her valve but we discussed the fact that myxomatous mitral valve sometimes developed chordal rupture and progressed to severe mitral regurgitation.  Follow-up echo in 1 year or sooner if her symptoms. SAM and LV outflow obstruction: No evidence of LV outflow tract obstruction on repeat echocardiography, after switching to beta-blocker therapy and stopping hydralazine .  She has a hyperdynamic left ventricle with an extremely  redundant mitral valve, but she does not have any features to suggest true hypertrophic obstructive cardiomyopathy.   HTN: Currently BP in optimal range.  She is on maximum tolerated dose of beta-blocker (limited by sinus bradycardia), spironolactone  and maximum tolerated amlodipine  (limited by edema).  Avoid NSAID use and high sodium foods.SABRA HLP: All lipid parameters are in desirable range.  Continue atorvastatin . Hyponatremia: Mild and  asymptomatic.  Monitor periodically.       Dispo:  Patient Instructions  Medication Instructions:  Your physician recommends that you continue on your current medications as directed. Please refer to the Current Medication list given to you today.  *If you need a refill on your cardiac medications before your next appointment, please call your pharmacy*  Testing/Procedures: Your physician has requested that you have an echocardiogram in one year. Echocardiography is a painless test that uses sound waves to create images of your heart. It provides your doctor with information about the size and shape of your heart and how well your heart's chambers and valves are working. This procedure takes approximately one hour. There are no restrictions for this procedure. Please do NOT wear cologne, perfume, aftershave, or lotions (deodorant is allowed). Please arrive 15 minutes prior to your appointment time.  Please note: We ask at that you not bring children with you during ultrasound (echo/ vascular) testing. Due to room size and safety concerns, children are not allowed in the ultrasound rooms during exams. Our front office staff cannot provide observation of children in our lobby area while testing is being conducted. An adult accompanying a patient to their appointment will only be allowed in the ultrasound room at the discretion of the ultrasound technician under special circumstances. We apologize for any inconvenience.   Follow-Up: At [facility], you and your health needs are our priority.  As part of our continuing mission to provide you with exceptional heart care, our providers are all part of one team.  This team includes your primary Cardiologist (physician) and Advanced Practice Providers or APPs (Physician Assistants and Nurse Practitioners) who all work together to provide you with the care you need, when you need it.  Your next appointment:   1 year(s)  Provider:   Jerel Balding, MD              Signed, Jerel Balding, MD

## 2024-07-06 DIAGNOSIS — G609 Hereditary and idiopathic neuropathy, unspecified: Secondary | ICD-10-CM | POA: Diagnosis not present

## 2024-07-06 DIAGNOSIS — M549 Dorsalgia, unspecified: Secondary | ICD-10-CM | POA: Diagnosis not present

## 2024-07-14 ENCOUNTER — Telehealth: Payer: Self-pay | Admitting: Internal Medicine

## 2024-07-14 NOTE — Telephone Encounter (Signed)
 Inbound call from patient stating she would like to speak to nurse in regards to how to properly take miralax  since she's been experiencing and on and off diarrhea and constipation Requesting a call back  Please advise  Thank you

## 2024-07-14 NOTE — Telephone Encounter (Signed)
 Discussed how to adjust her dosing to her body response. She will decrease the Miralax  dosage if stools become too loose. She will increase if the stools become too firm.

## 2024-08-28 ENCOUNTER — Encounter: Payer: Self-pay | Admitting: Cardiovascular Disease

## 2024-08-31 ENCOUNTER — Encounter: Admitting: Physical Medicine and Rehabilitation

## 2024-09-03 ENCOUNTER — Encounter: Admitting: Physical Medicine and Rehabilitation

## 2024-09-04 ENCOUNTER — Other Ambulatory Visit: Payer: Self-pay

## 2024-09-04 DIAGNOSIS — E890 Postprocedural hypothyroidism: Secondary | ICD-10-CM

## 2024-09-04 DIAGNOSIS — Z8639 Personal history of other endocrine, nutritional and metabolic disease: Secondary | ICD-10-CM

## 2024-09-04 MED ORDER — LEVOTHYROXINE SODIUM 75 MCG PO TABS: 75.0000 ug | ORAL_TABLET | Freq: Every day | ORAL | 3 refills | Status: AC

## 2024-09-07 ENCOUNTER — Encounter: Payer: Self-pay | Admitting: Cardiovascular Disease

## 2024-11-12 ENCOUNTER — Encounter: Admitting: Physical Medicine and Rehabilitation

## 2024-12-07 ENCOUNTER — Encounter: Admitting: Physical Medicine and Rehabilitation

## 2025-05-31 ENCOUNTER — Ambulatory Visit: Admitting: Internal Medicine

## 2025-06-09 ENCOUNTER — Ambulatory Visit (HOSPITAL_COMMUNITY)
